# Patient Record
Sex: Female | Born: 1945 | Race: White | Hispanic: No | Marital: Married | Smoking: Never smoker
Health system: Southern US, Community
[De-identification: ages and names within clinical notes are randomized; demographics above are authoritative.]

## PROBLEM LIST (undated history)

## (undated) DIAGNOSIS — M1990 Unspecified osteoarthritis, unspecified site: Secondary | ICD-10-CM

## (undated) DIAGNOSIS — C801 Malignant (primary) neoplasm, unspecified: Secondary | ICD-10-CM

## (undated) DIAGNOSIS — Z8489 Family history of other specified conditions: Secondary | ICD-10-CM

## (undated) DIAGNOSIS — E039 Hypothyroidism, unspecified: Secondary | ICD-10-CM

## (undated) DIAGNOSIS — I639 Cerebral infarction, unspecified: Secondary | ICD-10-CM

## (undated) DIAGNOSIS — F419 Anxiety disorder, unspecified: Secondary | ICD-10-CM

## (undated) DIAGNOSIS — I10 Essential (primary) hypertension: Secondary | ICD-10-CM

## (undated) DIAGNOSIS — R0600 Dyspnea, unspecified: Secondary | ICD-10-CM

## (undated) DIAGNOSIS — I471 Supraventricular tachycardia, unspecified: Secondary | ICD-10-CM

## (undated) DIAGNOSIS — K219 Gastro-esophageal reflux disease without esophagitis: Secondary | ICD-10-CM

## (undated) DIAGNOSIS — E079 Disorder of thyroid, unspecified: Secondary | ICD-10-CM

## (undated) DIAGNOSIS — T8859XA Other complications of anesthesia, initial encounter: Secondary | ICD-10-CM

## (undated) DIAGNOSIS — I771 Stricture of artery: Secondary | ICD-10-CM

## (undated) DIAGNOSIS — T4145XA Adverse effect of unspecified anesthetic, initial encounter: Secondary | ICD-10-CM

## (undated) DIAGNOSIS — R519 Headache, unspecified: Secondary | ICD-10-CM

## (undated) HISTORY — PX: APPENDECTOMY: SHX54

## (undated) HISTORY — PX: OVARIAN CYST REMOVAL: SHX89

## (undated) HISTORY — PX: CARDIAC CATHETERIZATION: SHX172

## (undated) HISTORY — PX: ABDOMINAL HYSTERECTOMY: SHX81

## (undated) HISTORY — PX: COLONOSCOPY: SHX174

## (undated) HISTORY — PX: DILATION AND CURETTAGE OF UTERUS: SHX78

## (undated) HISTORY — PX: CARPAL TUNNEL RELEASE: SHX101

---

## 2008-11-08 ENCOUNTER — Emergency Department: Payer: Self-pay | Admitting: Emergency Medicine

## 2015-01-16 DIAGNOSIS — R42 Dizziness and giddiness: Secondary | ICD-10-CM | POA: Insufficient documentation

## 2015-01-16 DIAGNOSIS — I10 Essential (primary) hypertension: Secondary | ICD-10-CM

## 2017-10-12 ENCOUNTER — Encounter: Payer: Self-pay | Admitting: Emergency Medicine

## 2017-10-12 ENCOUNTER — Emergency Department
Admission: EM | Admit: 2017-10-12 | Discharge: 2017-10-12 | Disposition: A | Discharge status: Home / Self Care | Payer: Medicare Other | Attending: Emergency Medicine | Admitting: Emergency Medicine

## 2017-10-12 ENCOUNTER — Emergency Department: Payer: Medicare Other

## 2017-10-12 DIAGNOSIS — R200 Anesthesia of skin: Secondary | ICD-10-CM | POA: Insufficient documentation

## 2017-10-12 DIAGNOSIS — I10 Essential (primary) hypertension: Secondary | ICD-10-CM | POA: Diagnosis not present

## 2017-10-12 DIAGNOSIS — R42 Dizziness and giddiness: Secondary | ICD-10-CM | POA: Diagnosis present

## 2017-10-12 LAB — URINALYSIS, COMPLETE (UACMP) WITH MICROSCOPIC
Bacteria, UA: NONE SEEN
Bilirubin Urine: NEGATIVE
Glucose, UA: NEGATIVE mg/dL
Hgb urine dipstick: NEGATIVE
Ketones, ur: NEGATIVE mg/dL
Leukocytes, UA: NEGATIVE
Nitrite: NEGATIVE
Protein, ur: NEGATIVE mg/dL
Specific Gravity, Urine: 1.004 — ABNORMAL LOW (ref 1.005–1.030)
Squamous Epithelial / LPF: NONE SEEN
WBC, UA: NONE SEEN WBC/hpf (ref 0–5)
pH: 8 (ref 5.0–8.0)

## 2017-10-12 LAB — CBC
HCT: 40.6 % (ref 35.0–47.0)
Hemoglobin: 14 g/dL (ref 12.0–16.0)
MCH: 31.5 pg (ref 26.0–34.0)
MCHC: 34.5 g/dL (ref 32.0–36.0)
MCV: 91.4 fL (ref 80.0–100.0)
Platelets: 218 10*3/uL (ref 150–440)
RBC: 4.44 MIL/uL (ref 3.80–5.20)
RDW: 12.2 % (ref 11.5–14.5)
WBC: 4.5 10*3/uL (ref 3.6–11.0)

## 2017-10-12 LAB — COMPREHENSIVE METABOLIC PANEL
ALT: 21 U/L (ref 14–54)
AST: 24 U/L (ref 15–41)
Albumin: 4.2 g/dL (ref 3.5–5.0)
Alkaline Phosphatase: 76 U/L (ref 38–126)
Anion gap: 7 (ref 5–15)
BUN: 14 mg/dL (ref 6–20)
CO2: 28 mmol/L (ref 22–32)
Calcium: 9.5 mg/dL (ref 8.9–10.3)
Chloride: 102 mmol/L (ref 101–111)
Creatinine, Ser: 0.7 mg/dL (ref 0.44–1.00)
GFR calc Af Amer: >60 mL/min (ref >60)
GFR calc non Af Amer: >60 mL/min (ref >60)
Glucose, Bld: 96 mg/dL (ref 65–99)
Potassium: 4.1 mmol/L (ref 3.5–5.1)
Sodium: 137 mmol/L (ref 135–145)
Total Bilirubin: 0.7 mg/dL (ref 0.3–1.2)
Total Protein: 7.6 g/dL (ref 6.5–8.1)

## 2017-10-12 LAB — TROPONIN I: Troponin I: <0.03 ng/mL (ref <0.03)

## 2017-10-12 MED ORDER — MECLIZINE HCL 25 MG PO TABS: 25.0000 mg | ORAL_TABLET | Freq: Three times a day (TID) | ORAL | 0 refills | Status: DC | PRN

## 2017-10-12 MED ORDER — MECLIZINE HCL 25 MG PO TABS
25.0000 mg | ORAL_TABLET | Freq: Once | ORAL | Status: AC
  Administered 2017-10-12: 25 mg via ORAL
  Filled 2017-10-12: qty 1

## 2017-10-12 NOTE — ED Notes (Signed)
Patient presents to the ED with dizziness that she first noticed when she sat up in bed to take her morning medication.  Patient states, "I felt so dizzy so I decided not to get up but to lie back down, I thought I got up too fast.  Then later when I went to the bathroom I was still extremely dizzy, I had to hold on to furniture to get there."  Patient is in no obvious distress at this time.

## 2017-10-12 NOTE — ED Notes (Signed)
Nurse assisted pt to restroom. Pt tolerated well. Pt changed into gown and non-slip socks and jewelry removed for MRI.

## 2017-10-12 NOTE — ED Notes (Signed)
Pt returned from MRI °

## 2017-10-12 NOTE — ED Notes (Signed)
Pt denying any HA at this time.. Pt stating HA at times when she lays on her side. Pt in NAD at this time. Pt is recently returned from [place]

## 2017-10-12 NOTE — ED Provider Notes (Signed)
[facility] Emergency Department Provider Note ____________________________________________   I have reviewed the triage vital signs and the triage nursing note no  HISTORY  Chief Complaint Dizziness   Historian Patient and spouse  HPI Shannon [name] is a 71 y.o. female with a history of hypertension, takes propranolol and 325 aspirin, without history of stroke or TIA presents with an episode where she woke up this morning around 5 AM and felt pressure in her head and then tingling or "twitching" or numbness at her right cheek which came and went.  No arm or leg weakness.  No slurred speech.  No trouble finding her words.  Never had an episode like this before.  States that she went back to sleep and when she got up and was walking she felt like she was going to go to the left side.  She called her primary care doctor's office and was referred to come to the ED.  No recent illnesses or fevers.  No respiratory symptoms.  Denies headache, just stated it was a pressure or in expansion.  No neck pain or stiffness.   No past medical history on file.  There are no active problems to display for this patient.   No past surgical history on file.  Prior to Admission medications   Medication Sig Start Date End Date Taking? Authorizing Provider  meclizine (ANTIVERT) 25 MG tablet Take 1 tablet (25 mg total) by mouth 3 (three) times daily as needed for dizziness or nausea. 10/12/17   Lisa Roca, MD    Allergies  Allergen Reactions  . Sulfa Antibiotics Other (See Comments)    Tongue turned black    No family history on file.  Social History Social History  Substance Use Topics  . Smoking status: Never Smoker  . Smokeless tobacco: Not on file  . Alcohol use Not on file    Review of Systems  Constitutional: Negative for fever. Eyes: Negative for visual changes. ENT: Negative for sore throat. Cardiovascular: Negative for chest pain. Respiratory: Negative for  shortness of breath. Gastrointestinal: Negative for abdominal pain, vomiting and diarrhea. Genitourinary: Negative for dysuria. Musculoskeletal: Negative for back pain. Skin: Negative for rash. Neurological: Positive for dizziness and a feeling of off balance as per HPI.  ____________________________________________   PHYSICAL EXAM:  VITAL SIGNS: ED Triage Vitals  Enc Vitals Group     BP 10/12/17 0852 (!) 191/68     Pulse Rate 10/12/17 0852 67     Resp 10/12/17 0852 18     Temp 10/12/17 0852 97.6 F (36.4 C)     Temp Source 10/12/17 0852 Oral     SpO2 10/12/17 0852 100 %     Weight 10/12/17 0853 114 lb (51.7 kg)     Height 10/12/17 0853 5\' 5"  (1.651 m)     Head Circumference --      Peak Flow --      Pain Score --      Pain Loc --      Pain Edu? --      Excl. in [place]? --      Constitutional: Alert and oriented. Well appearing and in no distress.  Somewhat anxious. HEENT   Head: Normocephalic and atraumatic.      Eyes: Conjunctivae are normal. Pupils equal and round.       Ears:         Nose: No congestion/rhinnorhea.   Mouth/Throat: Mucous membranes are moist.   Neck: No stridor. Cardiovascular/Chest: Normal rate,  regular rhythm.  No murmurs, rubs, or gallops. Respiratory: Normal respiratory effort without tachypnea nor retractions. Breath sounds are clear and equal bilaterally. No wheezes/rales/rhonchi. Gastrointestinal: Soft. No distention, no guarding, no rebound. Nontender.    Genitourinary/rectal:Deferred Musculoskeletal: Nontender with normal range of motion in all extremities. No joint effusions.  No lower extremity tenderness.  No edema. Neurologic: No facial droop.  Cranial nerves II through X intact.  Normal speech and language. No gross or focal neurologic deficits are appreciated.  5 out of 5 strength in 4 extremity's.  Coordination finger-nose intact bilaterally. Skin:  Skin is warm, dry and intact. No rash noted. Psychiatric: Mood and affect are  normal. Speech and behavior are normal. Patient exhibits appropriate insight and judgment.   ____________________________________________  LABS (pertinent positives/negatives) I, Lisa Roca, MD the attending physician have reviewed the labs noted below.  Labs Reviewed  URINALYSIS, COMPLETE (UACMP) WITH MICROSCOPIC - Abnormal; Notable for the following:       Result Value   Color, Urine STRAW (*)    APPearance CLEAR (*)    Specific Gravity, Urine 1.004 (*)    All other components within normal limits  CBC  COMPREHENSIVE METABOLIC PANEL  TROPONIN I    ____________________________________________    EKG I, Lisa Roca, MD, the attending physician have personally viewed and interpreted all ECGs.  60 bpm normal sinus rhythm.  Narrow QRS.  Nonspecific ST and T wave ____________________________________________  RADIOLOGY All Xrays were viewed by me.  Imaging interpreted by Radiologist, and I, Lisa Roca, MD the attending physician have reviewed the radiologist interpretation noted below.  CT head without contrast:    CLINICAL DATA: Dizziness  EXAM: CT HEAD WITHOUT CONTRAST  TECHNIQUE: Contiguous axial images were obtained from the base of the skull through the vertex without intravenous contrast.  COMPARISON: None.  FINDINGS: Brain: No evidence of acute infarction, hemorrhage, hydrocephalus, extra-axial collection or mass lesion/mass effect.  Vascular: No hyperdense vessel or unexpected calcification.  Skull: Normal. Negative for fracture or focal lesion.  Sinuses/Orbits: No acute finding.  Other: None.  IMPRESSION: No acute abnormality noted.     MRI without contrast brain:  IMPRESSION: 1. No acute intracranial abnormality. 2. 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 __________________________________________  PROCEDURES  Procedure(s) performed: None  Critical Care performed:  None  ____________________________________________  No current facility-administered medications on file prior to encounter.    No current outpatient prescriptions on file prior to encounter.    ____________________________________________  ED COURSE / ASSESSMENT AND PLAN  Pertinent labs & imaging results that were available during my care of the patient were reviewed by me and considered in my medical decision making (see chart for details).   By patient description, uncertain etiology.  No ongoing headache or really description of head pain.  She initially called it dizziness, and does not really describe it spinning, but does not really described as lightheadedness either.  She felt like she was off balance when she walked, but neurologic exam seems intact now.  Patient herself states that she does not quite feel back to baseline in terms of feeling motion sickness or off balance.  History, exam, and evaluation, seems unlikely for infection, dehydration, and orthostatics are normal.  Consider possibility of TIA, patient states she does not feel back to normal so I did discuss with her obtaining MRI to rule out any underlying small early stroke.  I think the most likely cause is vertigo although in some ways  not classic either.  MRI is overall reassuring for no acute finding.  Patient okay for discharge home.  I have asked her to have close follow-up with her primary care doctor.  DIFFERENTIAL DIAGNOSIS: Including but not limited to infection, urinary tract infection, hyperglycemia, electrolyte disturbance, vertigo, stroke, vision changes, orthostatic hypotension, arrhythmia, dehydration, etc.  CONSULTATIONS:   None   Patient / Family / Caregiver informed of clinical course, medical decision-making process, and agree with plan.   I discussed return precautions, follow-up instructions, and discharge instructions with patient and/or family.  Discharge Instructions : You are  evaluated for dizziness, and although no certain cause was found, your exam and evaluation are overall reassuring in the emergency department today.  I think most likely her episode is related to vertigo and you are discharged with meclizine to help with symptoms.  It is possible this could be related to a mini stroke called TIA.  Please call your doctor's office to discuss any further close follow-up for other testing with regard to TIA.  Return to the emergency department immediately for any new or worsening condition, any one-sided weakness or numbness, slurred speech, trouble finding words, dizziness, passing out, seizure, chest pain, palpitations, confusion altered mental status, or any other symptoms concerning to you.  ___________________________________________   FINAL CLINICAL IMPRESSION(S) / ED DIAGNOSES   Final diagnoses:  Dizziness              Note: This dictation was prepared with Dragon dictation. Any transcriptional errors that result from this process are unintentional    Lisa Roca, MD 10/12/17 1427

## 2017-10-12 NOTE — ED Notes (Signed)
Pt transported by [place], [place] to MRI for scan.

## 2017-10-12 NOTE — ED Notes (Signed)
MRI tech speaking with pt

## 2017-10-12 NOTE — ED Notes (Signed)
Nurse it to check on pt. Pt in NAD at this time. Nurse explained that we were just waiting on Dr. Reita Cliche to view MRI results at this time. Nurse helped re-position pt

## 2017-10-12 NOTE — Discharge Instructions (Signed)
You are evaluated for dizziness, and although no certain cause was found, your exam and evaluation are overall reassuring in the emergency department today.  I think most likely her episode is related to vertigo and you are discharged with meclizine to help with symptoms.  It is possible this could be related to a mini stroke called TIA.  Please call your doctor's office to discuss any further close follow-up for other testing with regard to TIA.  Return to the emergency department immediately for any new or worsening condition, any one-sided weakness or numbness, slurred speech, trouble finding words, dizziness, passing out, seizure, chest pain, palpitations, confusion altered mental status, or any other symptoms concerning to you.

## 2017-10-12 NOTE — ED Triage Notes (Signed)
States woke at 530 this am and was dizzy. Denies chest pain or SOB. Cont dizzy and weak.

## 2017-10-17 ENCOUNTER — Other Ambulatory Visit: Payer: Self-pay | Admitting: Family Medicine

## 2017-10-17 DIAGNOSIS — R42 Dizziness and giddiness: Secondary | ICD-10-CM

## 2017-10-20 ENCOUNTER — Ambulatory Visit (HOSPITAL_BASED_OUTPATIENT_CLINIC_OR_DEPARTMENT_OTHER)
Admission: RE | Admit: 2017-10-20 | Discharge: 2017-10-20 | Disposition: A | Discharge status: Home / Self Care | Payer: Medicare Other | Source: Ambulatory Visit | Attending: Family Medicine | Admitting: Family Medicine

## 2017-10-20 ENCOUNTER — Ambulatory Visit
Admission: RE | Admit: 2017-10-20 | Discharge: 2017-10-20 | Disposition: A | Discharge status: Home / Self Care | Payer: Medicare Other | Source: Ambulatory Visit | Attending: Family Medicine | Admitting: Family Medicine

## 2017-10-20 DIAGNOSIS — I5030 Unspecified diastolic (congestive) heart failure: Secondary | ICD-10-CM | POA: Diagnosis not present

## 2017-10-20 DIAGNOSIS — I6522 Occlusion and stenosis of left carotid artery: Secondary | ICD-10-CM | POA: Insufficient documentation

## 2017-10-20 DIAGNOSIS — R42 Dizziness and giddiness: Secondary | ICD-10-CM | POA: Diagnosis present

## 2017-10-20 NOTE — Progress Notes (Signed)
*  PRELIMINARY RESULTS* Echocardiogram 2D Echocardiogram has been performed.  Sherrie Sport 10/20/2017, 11:58 AM

## 2018-11-12 DIAGNOSIS — I639 Cerebral infarction, unspecified: Secondary | ICD-10-CM

## 2018-11-12 HISTORY — DX: Cerebral infarction, unspecified: I63.9

## 2018-12-08 ENCOUNTER — Inpatient Hospital Stay: Payer: Medicare Other

## 2018-12-08 ENCOUNTER — Other Ambulatory Visit: Payer: Self-pay

## 2018-12-08 ENCOUNTER — Inpatient Hospital Stay
Admission: EM | Admit: 2018-12-08 | Discharge: 2018-12-09 | DRG: 064 | Disposition: A | Discharge status: Home Health | Payer: Medicare Other | Attending: Internal Medicine | Admitting: Internal Medicine

## 2018-12-08 ENCOUNTER — Encounter: Payer: Self-pay | Admitting: Emergency Medicine

## 2018-12-08 ENCOUNTER — Emergency Department: Payer: Medicare Other

## 2018-12-08 DIAGNOSIS — I639 Cerebral infarction, unspecified: Principal | ICD-10-CM | POA: Diagnosis present

## 2018-12-08 DIAGNOSIS — Z823 Family history of stroke: Secondary | ICD-10-CM | POA: Diagnosis not present

## 2018-12-08 DIAGNOSIS — G936 Cerebral edema: Secondary | ICD-10-CM | POA: Diagnosis present

## 2018-12-08 DIAGNOSIS — Z8673 Personal history of transient ischemic attack (TIA), and cerebral infarction without residual deficits: Secondary | ICD-10-CM | POA: Diagnosis not present

## 2018-12-08 DIAGNOSIS — Z7982 Long term (current) use of aspirin: Secondary | ICD-10-CM | POA: Diagnosis not present

## 2018-12-08 DIAGNOSIS — R29810 Facial weakness: Secondary | ICD-10-CM | POA: Diagnosis present

## 2018-12-08 DIAGNOSIS — Z90710 Acquired absence of both cervix and uterus: Secondary | ICD-10-CM | POA: Diagnosis not present

## 2018-12-08 DIAGNOSIS — I10 Essential (primary) hypertension: Secondary | ICD-10-CM | POA: Diagnosis present

## 2018-12-08 DIAGNOSIS — R29700 NIHSS score 0: Secondary | ICD-10-CM | POA: Diagnosis present

## 2018-12-08 DIAGNOSIS — Z79890 Hormone replacement therapy (postmenopausal): Secondary | ICD-10-CM | POA: Diagnosis not present

## 2018-12-08 DIAGNOSIS — E039 Hypothyroidism, unspecified: Secondary | ICD-10-CM | POA: Diagnosis present

## 2018-12-08 DIAGNOSIS — Z79899 Other long term (current) drug therapy: Secondary | ICD-10-CM

## 2018-12-08 HISTORY — DX: Disorder of thyroid, unspecified: E07.9

## 2018-12-08 HISTORY — DX: Essential (primary) hypertension: I10

## 2018-12-08 LAB — COMPREHENSIVE METABOLIC PANEL
ALT: 21 U/L (ref 0–44)
AST: 24 U/L (ref 15–41)
Albumin: 4.3 g/dL (ref 3.5–5.0)
Alkaline Phosphatase: 73 U/L (ref 38–126)
Anion gap: 7 (ref 5–15)
BUN: 15 mg/dL (ref 8–23)
CO2: 26 mmol/L (ref 22–32)
Calcium: 9.7 mg/dL (ref 8.9–10.3)
Chloride: 105 mmol/L (ref 98–111)
Creatinine, Ser: 0.61 mg/dL (ref 0.44–1.00)
GFR calc Af Amer: >60 mL/min (ref >60)
GFR calc non Af Amer: >60 mL/min (ref >60)
Glucose, Bld: 88 mg/dL (ref 70–99)
Potassium: 3.9 mmol/L (ref 3.5–5.1)
Sodium: 138 mmol/L (ref 135–145)
Total Bilirubin: 0.5 mg/dL (ref 0.3–1.2)
Total Protein: 7.1 g/dL (ref 6.5–8.1)

## 2018-12-08 LAB — URINALYSIS, COMPLETE (UACMP) WITH MICROSCOPIC
Bacteria, UA: NONE SEEN
Bilirubin Urine: NEGATIVE
Glucose, UA: NEGATIVE mg/dL
Hgb urine dipstick: NEGATIVE
Ketones, ur: NEGATIVE mg/dL
Leukocytes, UA: NEGATIVE
Nitrite: NEGATIVE
Protein, ur: NEGATIVE mg/dL
Specific Gravity, Urine: 1.004 — ABNORMAL LOW (ref 1.005–1.030)
WBC, UA: NONE SEEN WBC/hpf (ref 0–5)
pH: 8 (ref 5.0–8.0)

## 2018-12-08 LAB — TROPONIN I: Troponin I: <0.03 ng/mL (ref <0.03)

## 2018-12-08 LAB — CBC WITH DIFFERENTIAL/PLATELET
Abs Immature Granulocytes: 0.01 10*3/uL (ref 0.00–0.07)
Basophils Absolute: 0 10*3/uL (ref 0.0–0.1)
Basophils Relative: 1 %
Eosinophils Absolute: 0.2 10*3/uL (ref 0.0–0.5)
Eosinophils Relative: 3 %
HCT: 39 % (ref 36.0–46.0)
Hemoglobin: 13.4 g/dL (ref 12.0–15.0)
Immature Granulocytes: 0 %
Lymphocytes Relative: 32 %
Lymphs Abs: 1.5 10*3/uL (ref 0.7–4.0)
MCH: 31.2 pg (ref 26.0–34.0)
MCHC: 34.4 g/dL (ref 30.0–36.0)
MCV: 90.9 fL (ref 80.0–100.0)
Monocytes Absolute: 0.3 10*3/uL (ref 0.1–1.0)
Monocytes Relative: 8 %
Neutro Abs: 2.6 10*3/uL (ref 1.7–7.7)
Neutrophils Relative %: 56 %
Platelets: 208 10*3/uL (ref 150–400)
RBC: 4.29 MIL/uL (ref 3.87–5.11)
RDW: 11.7 % (ref 11.5–15.5)
WBC: 4.6 10*3/uL (ref 4.0–10.5)
nRBC: 0 % (ref 0.0–0.2)

## 2018-12-08 LAB — TSH: TSH: 1.901 u[IU]/mL (ref 0.350–4.500)

## 2018-12-08 LAB — T4, FREE: Free T4: 1.03 ng/dL (ref 0.82–1.77)

## 2018-12-08 MED ORDER — LORAZEPAM 2 MG/ML IJ SOLN
0.5000 mg | Freq: Once | INTRAMUSCULAR | Status: AC
  Administered 2018-12-08: 0.5 mg via INTRAVENOUS
  Filled 2018-12-08: qty 1

## 2018-12-08 MED ORDER — ASPIRIN 325 MG PO TABS: 325.0000 mg | ORAL_TABLET | Freq: Every day | ORAL | Status: DC

## 2018-12-08 MED ORDER — ACETAMINOPHEN 325 MG PO TABS: 650.0000 mg | ORAL_TABLET | ORAL | Status: DC | PRN

## 2018-12-08 MED ORDER — ATORVASTATIN CALCIUM 20 MG PO TABS
40.0000 mg | ORAL_TABLET | Freq: Every day | ORAL | Status: DC
  Administered 2018-12-08: 40 mg via ORAL
  Filled 2018-12-08: qty 2

## 2018-12-08 MED ORDER — CALCIUM 600 MG PO TABS: 1.0000 | ORAL_TABLET | Freq: Every day | ORAL | Status: DC

## 2018-12-08 MED ORDER — LEVOTHYROXINE SODIUM 50 MCG PO TABS: 50.0000 ug | ORAL_TABLET | ORAL | Status: DC

## 2018-12-08 MED ORDER — LEVOTHYROXINE SODIUM 75 MCG PO TABS
75.0000 ug | ORAL_TABLET | ORAL | Status: DC
  Administered 2018-12-09: 75 ug via ORAL
  Filled 2018-12-08: qty 3
  Filled 2018-12-08: qty 1

## 2018-12-08 MED ORDER — LEVOTHYROXINE SODIUM 50 MCG PO TABS: 75.0000 ug | ORAL_TABLET | Freq: Every day | ORAL | Status: DC

## 2018-12-08 MED ORDER — ACETAMINOPHEN 650 MG RE SUPP: 650.0000 mg | RECTAL | Status: DC | PRN

## 2018-12-08 MED ORDER — LEVOTHYROXINE SODIUM 50 MCG PO TABS: 50.0000 ug | ORAL_TABLET | Freq: Every day | ORAL | Status: DC

## 2018-12-08 MED ORDER — CLONAZEPAM 0.5 MG PO TABS: 0.5000 mg | ORAL_TABLET | Freq: Two times a day (BID) | ORAL | Status: DC | PRN

## 2018-12-08 MED ORDER — VITAMIN E 180 MG (400 UNIT) PO CAPS
400.0000 [IU] | ORAL_CAPSULE | Freq: Every day | ORAL | Status: DC
  Filled 2018-12-08 (×2): qty 1

## 2018-12-08 MED ORDER — HYDRALAZINE HCL 20 MG/ML IJ SOLN
10.0000 mg | INTRAMUSCULAR | Status: DC | PRN
  Administered 2018-12-08: 10 mg via INTRAVENOUS
  Filled 2018-12-08: qty 1

## 2018-12-08 MED ORDER — LISINOPRIL 10 MG PO TABS
10.0000 mg | ORAL_TABLET | Freq: Every day | ORAL | Status: DC
  Administered 2018-12-08 – 2018-12-09 (×2): 10 mg via ORAL
  Filled 2018-12-08 (×2): qty 1

## 2018-12-08 MED ORDER — STROKE: EARLY STAGES OF RECOVERY BOOK
Freq: Once | Status: AC
  Administered 2018-12-08: 19:00:00

## 2018-12-08 MED ORDER — CLOPIDOGREL BISULFATE 75 MG PO TABS
75.0000 mg | ORAL_TABLET | Freq: Every day | ORAL | Status: DC
  Administered 2018-12-09: 75 mg via ORAL
  Filled 2018-12-08: qty 1

## 2018-12-08 MED ORDER — ASPIRIN 325 MG PO TABS
325.0000 mg | ORAL_TABLET | Freq: Every day | ORAL | Status: DC
  Filled 2018-12-08: qty 1

## 2018-12-08 MED ORDER — CALCIUM CARBONATE ANTACID 500 MG PO CHEW
1.0000 | CHEWABLE_TABLET | Freq: Every day | ORAL | Status: DC
  Administered 2018-12-09: 200 mg via ORAL
  Filled 2018-12-08: qty 1

## 2018-12-08 MED ORDER — ALENDRONATE SODIUM 70 MG PO TABS: 70.0000 mg | ORAL_TABLET | ORAL | Status: DC

## 2018-12-08 MED ORDER — ADULT MULTIVITAMIN W/MINERALS CH
1.0000 | ORAL_TABLET | Freq: Every day | ORAL | Status: DC
  Administered 2018-12-09: 11:00:00 1 via ORAL
  Filled 2018-12-08 (×2): qty 1

## 2018-12-08 MED ORDER — ASPIRIN EC 81 MG PO TBEC
81.0000 mg | DELAYED_RELEASE_TABLET | Freq: Every day | ORAL | Status: DC
  Administered 2018-12-09: 81 mg via ORAL
  Filled 2018-12-08 (×2): qty 1

## 2018-12-08 MED ORDER — ACETAMINOPHEN 160 MG/5ML PO SOLN
650.0000 mg | ORAL | Status: DC | PRN
  Filled 2018-12-08: qty 20.3

## 2018-12-08 MED ORDER — ASPIRIN 300 MG RE SUPP: 300.0000 mg | Freq: Every day | RECTAL | Status: DC

## 2018-12-08 MED ORDER — MULTIVITAMINS PO CAPS: 1.0000 | ORAL_CAPSULE | Freq: Every day | ORAL | Status: DC

## 2018-12-08 NOTE — ED Notes (Signed)
Pt arrived to ED from home with c/c of headache beginning last Saturday. Pt states that at onset she felt the right side of her mouth "pull up" and she felt that she had to pry it open to speak to her husband who was with her at the time. Pt and husband agreed that the facial episode lasted approx 10-15 seconds. Headache has been intermittent since. Last onset was this AM. Pt denies pain at time of assessment. Pt describes family Hx of stroke in mother and maternal aunt.

## 2018-12-08 NOTE — ED Provider Notes (Signed)
[facility] Emergency Department Provider Note       Time seen: ----------------------------------------- 9:13 AM on 12/08/2018 -----------------------------------------   I have reviewed the triage vital signs and the nursing notes.  HISTORY   Chief Complaint Headache and Weakness    HPI Shannon [name] is a 72 y.o. female with a history of dizziness, vertigo and headaches who presents to the ED for right-sided headache that started last Saturday.  Patient states it originally started on the base of her skull neck and then moved to her neck and back.  She reports feeling fatigued.  No past medical history on file.  There are no active problems to display for this patient.  Allergies Sulfa antibiotics and Mandol [cefamandole]  Social History Social History   Tobacco Use  . Smoking status: Never Smoker  Substance Use Topics  . Alcohol use: Not on file  . Drug use: Not on file   Review of Systems Constitutional: Negative for fever. Cardiovascular: Negative for chest pain. Respiratory: Negative for shortness of breath. Gastrointestinal: Negative for abdominal pain, vomiting and diarrhea. Musculoskeletal: Negative for back pain. Skin: Negative for rash. Neurological: Positive for headache, fatigue  All systems negative/normal/unremarkable except as stated in the HPI  ____________________________________________   PHYSICAL EXAM:  VITAL SIGNS: ED Triage Vitals [12/08/18 0859]  Enc Vitals Group     BP (!) 166/89     Pulse Rate 80     Resp 16     Temp (!) 97.4 F (36.3 C)     Temp Source Oral     SpO2 100 %     Weight 116 lb (52.6 kg)     Height 5\' 5"  (1.651 m)     Head Circumference      Peak Flow      Pain Score 0     Pain Loc      Pain Edu?      Excl. in [place]?    Constitutional: Alert and oriented. Well appearing and in no distress. Eyes: Conjunctivae are normal. Normal extraocular movements. ENT   Head: Normocephalic and  atraumatic.   Nose: No congestion/rhinnorhea.   Mouth/Throat: Mucous membranes are moist.   Neck: No stridor. Cardiovascular: Normal rate, regular rhythm. No murmurs, rubs, or gallops. Respiratory: Normal respiratory effort without tachypnea nor retractions. Breath sounds are clear and equal bilaterally. No wheezes/rales/rhonchi. Gastrointestinal: Soft and nontender. Normal bowel sounds Musculoskeletal: Nontender with normal range of motion in extremities. No lower extremity tenderness nor edema. Neurologic:  Normal speech and language. No gross focal neurologic deficits are appreciated.  Strength, sensation, cranial nerves appear to be normal Skin:  Skin is warm, dry and intact. No rash noted. Psychiatric: Mood and affect are normal. Speech and behavior are normal.  ____________________________________________  EKG: Interpreted by me.  Sinus rhythm with a rate of 83 bpm, sinus arrhythmia, normal axis, normal QT  ____________________________________________  ED COURSE:  As part of my medical decision making, I reviewed the following data within the [employer] History obtained from family if available, nursing notes, old chart and ekg, as well as notes from prior ED visits. Patient presented for headache, we will assess with labs and imaging as indicated at this time.   Procedures ____________________________________________   LABS (pertinent positives/negatives)  Labs Reviewed  CBC WITH DIFFERENTIAL/PLATELET  COMPREHENSIVE METABOLIC PANEL  TROPONIN I  TSH  T4, FREE  URINALYSIS, COMPLETE (UACMP) WITH MICROSCOPIC    RADIOLOGY Images were viewed by me  IMPRESSION: Apparent acute  infarct involving much of the right basal ganglia. Localized cytotoxic edema in this area noted.  Prior infarct anterior limb left external capsule.  No mass or hemorrhage. There are foci of arterial vascular calcification. There is mild mucosal thickening in several  ethmoid air cells.  ____________________________________________  DIFFERENTIAL DIAGNOSIS   Migraine, tension headache, CVA, TIA, trigeminal neuralgia  FINAL ASSESSMENT AND PLAN  CVA   Plan: The patient had presented for recent headache. Patient's labs were unremarkable. Patient's imaging did surprisingly reveal acute infarct involving the right basal ganglia with some localized edema.  She currently takes a full aspirin dose daily.  She will need MRI and further stroke work-up.  I will discuss with the hospitalist for admission.   Laurence Aly, MD   Note: This note was generated in part or whole with voice recognition software. Voice recognition is usually quite accurate but there are transcription errors that can and very often do occur. I apologize for any typographical errors that were not detected and corrected.     Earleen Newport, MD 12/08/18 [phone]

## 2018-12-08 NOTE — ED Notes (Signed)
Patient transported to room 116. 

## 2018-12-08 NOTE — Progress Notes (Signed)
PT Cancellation Note  Patient Details Name: Shannon [name] MRN: [id number] DOB: August 22, 1946   Cancelled Treatment:    Reason Eval/Treat Not Completed: Other (comment).  Pt with new stroke and pending neuro consult and brain MRI.  Will wait to see pt for PT until neuro consult completed and POC established.    Collie Siad PT, DPT 12/08/2018, 12:40 PM

## 2018-12-08 NOTE — H&P (Addendum)
[facility] [place] NAME: Shannon [name]    MR#:  [id number]  DATE OF BIRTH:  February 11, 1946  DATE OF ADMISSION:  12/08/2018  PRIMARY CARE PHYSICIAN: Donalynn Furlong, MD   REQUESTING/REFERRING PHYSICIAN:   CHIEF COMPLAINT:   Chief Complaint  Patient presents with  . Headache  . Weakness    HISTORY OF PRESENT ILLNESS: Shannon [name]  is a 72 y.o. female with a known history of hypertension, hypothyroidism presented to the emergency room for generalized weakness for the last 1 week.  She also had some on and off headache for the last 1 week.  She noticed some slowness of speech but no facial droop.  No complaints of any tingling or numbness in any of the body she.  He was evaluated in the emergency room with CT head which showed infarct in the right basal ganglia.  Hospitalist service was consulted.  Neurology service has been notified.  PAST MEDICAL HISTORY:   Past Medical History:  Diagnosis Date  . Hypertension   . Thyroid disease     PAST SURGICAL HISTORY: Dilatation and curettage Ovarian cyst removal Hysterectomy  SOCIAL HISTORY:  Social History   Tobacco Use  . Smoking status: Never Smoker  . Smokeless tobacco: Never Used  Substance Use Topics  . Alcohol use: Never    Frequency: Never    FAMILY HISTORY:  Family History  Problem Relation Age of Onset  . CVA Mother   . Heart disease Mother   . Peripheral Artery Disease Father     DRUG ALLERGIES:  Allergies  Allergen Reactions  . Sulfa Antibiotics Other (See Comments)    Tongue turned black  . Mandol [Cefamandole] Rash    REVIEW OF SYSTEMS:   CONSTITUTIONAL: No fever, has fatigue and weakness.  EYES: No blurred or double vision.  EARS, NOSE, AND THROAT: No tinnitus or ear pain.  RESPIRATORY: No cough, shortness of breath, wheezing or hemoptysis.  CARDIOVASCULAR: No chest pain, orthopnea, edema.  GASTROINTESTINAL: No nausea, vomiting, diarrhea or abdominal  pain.  GENITOURINARY: No dysuria, hematuria.  ENDOCRINE: No polyuria, nocturia,  HEMATOLOGY: No anemia, easy bruising or bleeding SKIN: No rash or lesion. MUSCULOSKELETAL: No joint pain or arthritis.   NEUROLOGIC: No tingling, numbness, weakness.  Had headache PSYCHIATRY: No anxiety or depression.   MEDICATIONS AT HOME:  Prior to Admission medications   Medication Sig Start Date End Date Taking? Authorizing Provider  aspirin 325 MG tablet Take 1 tablet by mouth daily. 04/24/12  Yes [provider]  calcium carbonate (OS-CAL) 600 MG tablet Take 1 tablet by mouth daily.   Yes [provider]  calcium citrate-vitamin D (CITRACAL+D) 315-200 MG-UNIT tablet Take 1 tablet by mouth daily. 04/24/12  Yes [provider]  famotidine-calcium carbonate-magnesium hydroxide (PEPCID COMPLETE) 10-800-165 MG chewable tablet Chew 1 tablet by mouth daily as needed.   Yes [provider]  levothyroxine (SYNTHROID, LEVOTHROID) 50 MCG tablet Take 1 tablet by mouth daily. 04/24/12  Yes [provider]  levothyroxine (SYNTHROID, LEVOTHROID) 75 MCG tablet Take 1 tablet by mouth daily. 04/24/12  Yes [provider]  lisinopril (PRINIVIL,ZESTRIL) 10 MG tablet Take 1 tablet by mouth daily.   Yes [provider]  Multiple Vitamin (MULTIVITAMIN) capsule Take 1 capsule by mouth daily. 04/24/12  Yes [provider]  vitamin E 400 UNIT capsule Take 1 capsule by mouth daily.   Yes [provider]  alendronate (FOSAMAX) 10 MG tablet Take 1 tablet  by mouth daily.    [provider]  alendronate (FOSAMAX) 70 MG tablet Take 1 tablet by mouth once a week.    [provider]  Alum Hydroxide-Mag Carbonate (GAVISCON PO) Take 5 mLs by mouth daily as needed.    [provider]  clonazePAM (KLONOPIN) 0.5 MG tablet Take 1 tablet by mouth daily as needed.    [provider]  meclizine (ANTIVERT) 25 MG tablet Take 1 tablet (25  mg total) by mouth 3 (three) times daily as needed for dizziness or nausea. Patient not taking: Reported on 12/08/2018 10/12/17   Lisa Roca, MD  ranitidine (ZANTAC) 150 MG tablet Take 1 tablet by mouth daily.    [provider]      PHYSICAL EXAMINATION:   VITAL SIGNS: Blood pressure (!) 197/92, pulse 74, temperature (!) 97.4 F (36.3 C), temperature source Oral, resp. rate 20, height 5\' 5"  (1.651 m), weight 52.6 kg, SpO2 100 %.  GENERAL:  72 y.o.-year-old patient lying in the bed with no acute distress.  EYES: Pupils equal, round, reactive to light and accommodation. No scleral icterus. Extraocular muscles intact.  HEENT: Head atraumatic, normocephalic. Oropharynx and nasopharynx clear.  NECK:  Supple, no jugular venous distention. No thyroid enlargement, no tenderness.  LUNGS: Normal breath sounds bilaterally, no wheezing, rales,rhonchi or crepitation. No use of accessory muscles of respiration.  CARDIOVASCULAR: S1, S2 normal. No murmurs, rubs, or gallops.  ABDOMEN: Soft, nontender, nondistended. Bowel sounds present. No organomegaly or mass.  EXTREMITIES: No pedal edema, cyanosis, or clubbing.  NEUROLOGIC: Cranial nerves II through XII are intact. Muscle strength 5/5 in all extremities. Sensation intact. Gait not checked.  PSYCHIATRIC: The patient is alert and oriented x 3.  SKIN: No obvious rash, lesion, or ulcer.   LABORATORY PANEL:   CBC Recent Labs  Lab 12/08/18 0929  WBC 4.6  HGB 13.4  HCT 39.0  PLT 208  MCV 90.9  MCH 31.2  MCHC 34.4  RDW 11.7  LYMPHSABS 1.5  MONOABS 0.3  EOSABS 0.2  BASOSABS 0.0   ------------------------------------------------------------------------------------------------------------------  Chemistries  Recent Labs  Lab 12/08/18 0929  NA 138  K 3.9  CL 105  CO2 26  GLUCOSE 88  BUN 15  CREATININE 0.61  CALCIUM 9.7  AST 24  ALT 21  ALKPHOS 73  BILITOT 0.5    ------------------------------------------------------------------------------------------------------------------ estimated creatinine clearance is 52.8 mL/min (by C-G formula based on SCr of 0.61 mg/dL). ------------------------------------------------------------------------------------------------------------------ Recent Labs    12/08/18 0929  TSH 1.901     Coagulation profile No results for input(s): INR, PROTIME in the last 168 hours. ------------------------------------------------------------------------------------------------------------------- No results for input(s): DDIMER in the last 72 hours. -------------------------------------------------------------------------------------------------------------------  Cardiac Enzymes Recent Labs  Lab 12/08/18 0929  TROPONINI <0.03   ------------------------------------------------------------------------------------------------------------------ Invalid input(s): POCBNP  ---------------------------------------------------------------------------------------------------------------  Urinalysis    Component Value Date/Time   COLORURINE YELLOW (A) 12/08/2018 0929   APPEARANCEUR CLEAR (A) 12/08/2018 0929   LABSPEC 1.004 (L) 12/08/2018 0929   PHURINE 8.0 12/08/2018 0929   GLUCOSEU NEGATIVE 12/08/2018 0929   HGBUR NEGATIVE 12/08/2018 0929   BILIRUBINUR NEGATIVE 12/08/2018 0929   KETONESUR NEGATIVE 12/08/2018 0929   PROTEINUR NEGATIVE 12/08/2018 0929   NITRITE NEGATIVE 12/08/2018 0929   LEUKOCYTESUR NEGATIVE 12/08/2018 0929     RADIOLOGY: Ct Head Wo Contrast  Result Date: 12/08/2018 CLINICAL DATA:  Right-sided headache for 6 days. Fatigue. Right-sided mouth weakness EXAM: CT HEAD WITHOUT CONTRAST TECHNIQUE: Contiguous axial images were obtained from the base of the skull through the vertex  without intravenous contrast. COMPARISON:  Head CT and brain MRI October 12, 2017 FINDINGS: Brain: The ventricles are normal in  size and configuration. There is no intracranial mass, hemorrhage, extra-axial fluid collection, or midline shift. There is decreased attenuation involving much of the right lentiform nucleus as well as involving portions of the right internal and external capsule, concerning for acute right basal ganglia infarct. There is evidence of a prior infarct involving the anterior limb of the left external capsule, stable. Brain parenchyma otherwise appears unremarkable. Vascular: No hyperdense vessel. There is calcification in each carotid siphon region. Skull: The bony calvarium appears intact. Sinuses/Orbits: There is mild mucosal thickening in several ethmoid air cells. Other visualized paranasal sinuses are clear. Visualized orbits appear symmetric bilaterally. Other: Mastoid air cells are clear. IMPRESSION: Apparent acute infarct involving much of the right basal ganglia. Localized cytotoxic edema in this area noted. Prior infarct anterior limb left external capsule. No mass or hemorrhage. There are foci of arterial vascular calcification. There is mild mucosal thickening in several ethmoid air cells. Electronically Signed   By: Lowella Grip III M.D.   On: 12/08/2018 09:42    EKG: Orders placed or performed during the hospital encounter of 12/08/18  . EKG 12-Lead  . EKG 12-Lead  . ED EKG  . ED EKG    IMPRESSION AND PLAN:  72 year old female patient with history of hypertension, hypothyroidism presented to the emergency room with generalized weakness and headache  -Acute ischemic CVA Onset of duration more than 1 week Continue aspirin Neurology consult Check MRI and MRA brain Carotid ultrasound and echocardiogram Cardiac monitoring Physical therapy and Occupational Therapy and speech therapy consults  -Hypertension uncontrolled Continue oral lisinopril for control of blood pressure PRN IV hydralazine as needed  -Hypothyroidism Resume Synthroid  -DVT prophylaxis Sequential  compression device to lower extremities  All the records are reviewed and case discussed with ED provider. Management plans discussed with the patient, family and they are in agreement.  CODE STATUS:Full code    TOTAL TIME TAKING CARE OF THIS PATIENT: 54 minutes.    Saundra Shelling M.D on 12/08/2018 at 11:23 AM  Between 7am to 6pm - Pager - [phone]  After 6pm go to [email or web address] - password [email or web address] [facility]  [facility]  Office  [phone]  CC: Primary care physician; Donalynn Furlong, MD

## 2018-12-08 NOTE — Progress Notes (Signed)
SLP Cancellation Note  Patient Details Name: Shannon [name] MRN: [id number] DOB: May 01, 1946   Cancelled treatment:       Reason Eval/Treat Not Completed: Patient at procedure or test/unavailable: Upon entry to room, family member present and stated that patient was taken for an MRI at time of visit. Eval not completed at this time. Skilled ST will f/u with patient for eval as indicated.   Loni Beckwith, M.S. CCC-SLP Speech-Language Pathologist  Loni Beckwith 12/08/2018, 1:31 PM

## 2018-12-08 NOTE — Progress Notes (Signed)
Advanced care plan.  Purpose of the Encounter: CODE STATUS  Parties in Attendance: Patient and family  Patient's Decision Capacity: Good  Subjective/Patient's story: Patient presented to the emergency room for generalized weakness and headache   Objective/Medical story She was evaluated for stroke CT head was positive for ischemic CVA Needs neurology consultation, carotid ultrasound MRI brain and work-up   Goals of care determination:  Advance care directives goals of care and treatment plan discussed Patient wants everything done which includes CPR, intubation ventilator if the need arises   CODE STATUS: Full code   Time spent discussing advanced care planning: 16 minutes

## 2018-12-08 NOTE — ED Notes (Addendum)
First Nurse Note: Patient states she "might be having a stroke", states she had right sided weakness of face onset on Saturday.  Alert and oriented X 4, speech clear.  No obvious weakness of extremities noted at this time.

## 2018-12-08 NOTE — ED Notes (Signed)
ED TO INPATIENT HANDOFF REPORT  Name/Age/Gender Shannon [name] 72 y.o. female  Code Status   Home/SNF/Other Home  Chief Complaint Right Sided Weakness  Level of Care/Admitting Diagnosis ED Disposition    ED Disposition Condition [facility]: [place] [[id number]]  Level of Care: Med-Surg [16]  Diagnosis: CVA (cerebral vascular accident) [facility]) [[id number]]  Admitting Physician: Saundra Shelling [[id number]]  Attending Physician: Saundra Shelling [[id number]]  Estimated length of stay: past midnight tomorrow  Certification:: I certify this patient will need inpatient services for at least 2 midnights  PT Class (Do Not Modify): Inpatient [101]  PT Acc Code (Do Not Modify): Private [1]       Medical History Past Medical History:  Diagnosis Date  . Hypertension   . Thyroid disease     Allergies Allergies  Allergen Reactions  . Sulfa Antibiotics Other (See Comments)    Tongue turned black  . Mandol [Cefamandole] Rash    IV Location/Drains/Wounds Patient Lines/Drains/Airways Status   Active Line/Drains/Airways    Name:   Placement date:   Placement time:   Site:   Days:   Peripheral IV 12/08/18 Right Antecubital   12/08/18    1027    Antecubital   less than 1          Labs/Imaging Results for orders placed or performed during the hospital encounter of 12/08/18 (from the past 48 hour(s))  CBC with Differential/Platelet     Status: None   Collection Time: 12/08/18  9:29 AM  Result Value Ref Range   WBC 4.6 4.0 - 10.5 K/uL   RBC 4.29 3.87 - 5.11 MIL/uL   Hemoglobin 13.4 12.0 - 15.0 g/dL   HCT 39.0 36.0 - 46.0 %   MCV 90.9 80.0 - 100.0 fL   MCH 31.2 26.0 - 34.0 pg   MCHC 34.4 30.0 - 36.0 g/dL   RDW 11.7 11.5 - 15.5 %   Platelets 208 150 - 400 K/uL   nRBC 0.0 0.0 - 0.2 %   Neutrophils Relative % 56 %   Neutro Abs 2.6 1.7 - 7.7 K/uL   Lymphocytes Relative 32 %   Lymphs Abs 1.5 0.7 - 4.0 K/uL   Monocytes Relative 8 %   Monocytes  Absolute 0.3 0.1 - 1.0 K/uL   Eosinophils Relative 3 %   Eosinophils Absolute 0.2 0.0 - 0.5 K/uL   Basophils Relative 1 %   Basophils Absolute 0.0 0.0 - 0.1 K/uL   Immature Granulocytes 0 %   Abs Immature Granulocytes 0.01 0.00 - 0.07 K/uL    Comment: Performed at [facility], [address] [place], [place] [address]  Comprehensive metabolic panel     Status: None   Collection Time: 12/08/18  9:29 AM  Result Value Ref Range   Sodium 138 135 - 145 mmol/L   Potassium 3.9 3.5 - 5.1 mmol/L   Chloride 105 98 - 111 mmol/L   CO2 26 22 - 32 mmol/L   Glucose, Bld 88 70 - 99 mg/dL   BUN 15 8 - 23 mg/dL   Creatinine, Ser 0.61 0.44 - 1.00 mg/dL   Calcium 9.7 8.9 - 10.3 mg/dL   Total Protein 7.1 6.5 - 8.1 g/dL   Albumin 4.3 3.5 - 5.0 g/dL   AST 24 15 - 41 U/L   ALT 21 0 - 44 U/L   Alkaline Phosphatase 73 38 - 126 U/L   Total Bilirubin 0.5 0.3 - 1.2 mg/dL   GFR  calc non Af Amer >60 >60 mL/min   GFR calc Af Amer >60 >60 mL/min   Anion gap 7 5 - 15    Comment: Performed at [facility], [address] [place], [place] [address]  Troponin I - ONCE - STAT     Status: None   Collection Time: 12/08/18  9:29 AM  Result Value Ref Range   Troponin I <0.03 <0.03 ng/mL    Comment: Performed at [facility], [address] [place], [place] [address]  TSH     Status: None   Collection Time: 12/08/18  9:29 AM  Result Value Ref Range   TSH 1.901 0.350 - 4.500 uIU/mL    Comment: Performed by a 3rd Generation assay with a functional sensitivity of <=0.01 uIU/mL. Performed at [facility], [address] [place], [place] [address]   T4, free     Status: None   Collection Time: 12/08/18  9:29 AM  Result Value Ref Range   Free T4 1.03 0.82 - 1.77 ng/dL    Comment: (NOTE) Biotin ingestion may interfere with free T4 tests. If the results are inconsistent with the TSH level, previous test results, or the clinical presentation, then consider biotin  interference. If needed, order repeat testing after stopping biotin. Performed at [facility], [address] [place], [place] [address]   Urinalysis, Complete w Microscopic     Status: Abnormal   Collection Time: 12/08/18  9:29 AM  Result Value Ref Range   Color, Urine YELLOW (A) YELLOW   APPearance CLEAR (A) CLEAR   Specific Gravity, Urine 1.004 (L) 1.005 - 1.030   pH 8.0 5.0 - 8.0   Glucose, UA NEGATIVE NEGATIVE mg/dL   Hgb urine dipstick NEGATIVE NEGATIVE   Bilirubin Urine NEGATIVE NEGATIVE   Ketones, ur NEGATIVE NEGATIVE mg/dL   Protein, ur NEGATIVE NEGATIVE mg/dL   Nitrite NEGATIVE NEGATIVE   Leukocytes, UA NEGATIVE NEGATIVE   WBC, UA NONE SEEN 0 - 5 WBC/hpf   Bacteria, UA NONE SEEN NONE SEEN   Squamous Epithelial / LPF 0-5 0 - 5    Comment: Performed at [facility], [address], [place], [place] [address]   Ct Head Wo Contrast  Result Date: 12/08/2018 CLINICAL DATA:  Right-sided headache for 6 days. Fatigue. Right-sided mouth weakness EXAM: CT HEAD WITHOUT CONTRAST TECHNIQUE: Contiguous axial images were obtained from the base of the skull through the vertex without intravenous contrast. COMPARISON:  Head CT and brain MRI October 12, 2017 FINDINGS: Brain: The ventricles are normal in size and configuration. There is no intracranial mass, hemorrhage, extra-axial fluid collection, or midline shift. There is decreased attenuation involving much of the right lentiform nucleus as well as involving portions of the right internal and external capsule, concerning for acute right basal ganglia infarct. There is evidence of a prior infarct involving the anterior limb of the left external capsule, stable. Brain parenchyma otherwise appears unremarkable. Vascular: No hyperdense vessel. There is calcification in each carotid siphon region. Skull: The bony calvarium appears intact. Sinuses/Orbits: There is mild mucosal thickening in several ethmoid air cells. Other  visualized paranasal sinuses are clear. Visualized orbits appear symmetric bilaterally. Other: Mastoid air cells are clear. IMPRESSION: Apparent acute infarct involving much of the right basal ganglia. Localized cytotoxic edema in this area noted. Prior infarct anterior limb left external capsule. No mass or hemorrhage. There are foci of arterial vascular calcification. There is mild mucosal thickening in several ethmoid air cells. Electronically  Signed   By: Lowella Grip III M.D.   On: 12/08/2018 09:42    Pending Labs Unresulted Labs (From admission, onward)    Start     Ordered   Signed and Held  Lipid panel  Tomorrow morning,   R    Comments:  Fasting    Signed and Held          Vitals/Pain Today's Vitals   12/08/18 0859 12/08/18 0923 12/08/18 1100 12/08/18 1115  BP: (!) 166/89 (!) 199/82 (!) 202/82 (!) 197/92  Pulse: 80 72 74   Resp: 16 18 (!) 21 20  Temp: (!) 97.4 F (36.3 C)     TempSrc: Oral     SpO2: 100% 98% 100%   Weight: 52.6 kg     Height: 5\' 5"  (1.651 m)     PainSc: 0-No pain 0-No pain      Isolation Precautions No active isolations  Medications Medications  LORazepam (ATIVAN) injection 0.5 mg (0.5 mg Intravenous Given 12/08/18 1042)    Mobility walks

## 2018-12-08 NOTE — Progress Notes (Signed)
PHARMACIST - PHYSICIAN COMMUNICATION  CONCERNING: P&T Medication Policy Regarding Oral Bisphosphonates  RECOMMENDATION: Your order for alendronate (Fosamax), ibandronate (Boniva), or risedronate (Actonel) has been discontinued at this time.  If the patient's post-hospital medical condition warrants safe use of this class of drugs, please resume the pre-hospital regimen upon discharge.  DESCRIPTION:  Alendronate (Fosamax), ibandronate (Boniva), and risedronate (Actonel) can cause severe esophageal erosions in patients who are unable to remain upright at least 30 minutes after taking this medication.   Since brief interruptions in therapy are thought to have minimal impact on bone mineral density, the [place] [facility] has established that bisphosphonate orders should be routinely discontinued during hospitalization.   To override this safety policy and permit administration of Boniva, Fosamax, or Actonel in the hospital, prescribers must write "DO NOT HOLD" in the comments section when placing the order for this class of medications.  Chinita Greenland PharmD Clinical Pharmacist 12/08/2018

## 2018-12-08 NOTE — ED Notes (Signed)
Pt assisted to bathroom

## 2018-12-08 NOTE — Consult Note (Signed)
Referring Physician: Pyreddy    Chief Complaint: Weakness and right facial droop  HPI: Shannon [name] is an 72 y.o. female with a history of stroke in the past who reports that on Saturday she noted that her face was pulling to the right and her speech was slurred.  On Sunday and Monday there was improvement in the facial droop but she was weak all over.  With no improvement the patient presented for evaluation.  Initial NIHSS of 0.    Date last known well: Date: 12/02/2018 Time last known well: Unable to determine tPA Given: No: Outside time window  Past Medical History:  Diagnosis Date  . Hypertension   . Thyroid disease     Past Surgical History:  Procedure Laterality Date  . ABDOMINAL HYSTERECTOMY    . APPENDECTOMY    . CARPAL TUNNEL RELEASE Bilateral   . DILATION AND CURETTAGE OF UTERUS    . OVARIAN CYST REMOVAL      Family History  Problem Relation Age of Onset  . CVA Mother   . Heart disease Mother   . Peripheral Artery Disease Father    Social History:  reports that she has never smoked. She has never used smokeless tobacco. She reports that she does not drink alcohol or use drugs.  Allergies:  Allergies  Allergen Reactions  . Other Palpitations    SINUTAB  . Sulfa Antibiotics Other (See Comments)    Tongue turned black  . Mandol [Cefamandole] Rash    Medications:  I have reviewed the patient's current medications. Prior to Admission medications   Medication Sig Start Date End Date Taking? Authorizing Provider  aspirin 325 MG tablet Take 1 tablet by mouth daily. 04/24/12  Yes [provider]  calcium carbonate (OS-CAL) 600 MG tablet Take 1 tablet by mouth daily.   Yes [provider]  calcium citrate-vitamin D (CITRACAL+D) 315-200 MG-UNIT tablet Take 1 tablet by mouth daily. 04/24/12  Yes [provider]  famotidine-calcium carbonate-magnesium hydroxide (PEPCID COMPLETE) 10-800-165 MG chewable tablet Chew 1 tablet by mouth daily as  needed.   Yes [provider]  levothyroxine (SYNTHROID, LEVOTHROID) 50 MCG tablet Take 1 tablet by mouth daily. 04/24/12  Yes [provider]  levothyroxine (SYNTHROID, LEVOTHROID) 75 MCG tablet Take 1 tablet by mouth daily. 04/24/12  Yes [provider]  lisinopril (PRINIVIL,ZESTRIL) 10 MG tablet Take 1 tablet by mouth daily.   Yes [provider]  Multiple Vitamin (MULTIVITAMIN) capsule Take 1 capsule by mouth daily. 04/24/12  Yes [provider]  vitamin E 400 UNIT capsule Take 1 capsule by mouth daily.   Yes [provider]  alendronate (FOSAMAX) 10 MG tablet Take 1 tablet by mouth daily.    [provider]  alendronate (FOSAMAX) 70 MG tablet Take 1 tablet by mouth once a week.    [provider]  Alum Hydroxide-Mag Carbonate (GAVISCON PO) Take 5 mLs by mouth daily as needed.    [provider]  clonazePAM (KLONOPIN) 0.5 MG tablet Take 1 tablet by mouth daily as needed.    [provider]  meclizine (ANTIVERT) 25 MG tablet Take 1 tablet (25 mg total) by mouth 3 (three) times daily as needed for dizziness or nausea. Patient not taking: Reported on 12/08/2018 10/12/17   Lisa Roca, MD  ranitidine (ZANTAC) 150 MG tablet Take 1 tablet by mouth daily.    [provider]    Scheduled: .  stroke: mapping our early stages of recovery book  Does not apply Once  . aspirin  300 mg Rectal Daily   Or  . aspirin  325 mg Oral Daily  . atorvastatin  40 mg Oral q1800  . calcium carbonate  1 tablet Oral Daily  . [START ON 12/11/2018] levothyroxine  50 mcg Oral Q M,W,F  . [START ON 12/09/2018] levothyroxine  75 mcg Oral Q T,Th,S,Su  . lisinopril  10 mg Oral Daily  . multivitamin with minerals  1 tablet Oral Daily  . vitamin E  400 Units Oral Daily    ROS: History obtained from the patient  General ROS: fatigue Psychological ROS: negative for - behavioral disorder, hallucinations, memory  difficulties, mood swings or suicidal ideation Ophthalmic ROS: negative for - blurry vision, double vision, eye pain or loss of vision ENT ROS: negative for - epistaxis, nasal discharge, oral lesions, sore throat, tinnitus or vertigo Allergy and Immunology ROS: negative for - hives or itchy/watery eyes Hematological and Lymphatic ROS: negative for - bleeding problems, bruising or swollen lymph nodes Endocrine ROS: negative for - galactorrhea, hair pattern changes, polydipsia/polyuria or temperature intolerance Respiratory ROS: negative for - cough, hemoptysis, shortness of breath or wheezing Cardiovascular ROS: negative for - chest pain, dyspnea on exertion, edema or irregular heartbeat Gastrointestinal ROS: negative for - abdominal pain, diarrhea, hematemesis, nausea/vomiting or stool incontinence Genito-Urinary ROS: negative for - dysuria, hematuria, incontinence or urinary frequency/urgency Musculoskeletal ROS: negative for - joint swelling or muscular weakness Neurological ROS: as noted in HPI Dermatological ROS: negative for rash and skin lesion changes  Physical Examination: Blood pressure (!) 182/80, pulse 72, temperature 97.7 F (36.5 C), temperature source Oral, resp. rate 18, height 5\' 5"  (1.651 m), weight 51.7 kg, SpO2 100 %.  HEENT-  Normocephalic, no lesions, without obvious abnormality.  Normal external eye and conjunctiva.  Normal TM's bilaterally.  Normal auditory canals and external ears. Normal external nose, mucus membranes and septum.  Normal pharynx. Cardiovascular- S1, S2 normal, pulses palpable throughout   Lungs- chest clear, no wheezing, rales, normal symmetric air entry Abdomen- soft, non-tender; bowel sounds normal; no masses,  no organomegaly Extremities- no edema Lymph-no adenopathy palpable Musculoskeletal-no joint tenderness, deformity or swelling Skin-warm and dry, no hyperpigmentation, vitiligo, or suspicious lesions  Neurological Examination   Mental  Status: Alert, oriented, thought content appropriate.  Speech fluent without evidence of aphasia.  Able to follow 3 step commands without difficulty. Cranial Nerves: II: Discs flat bilaterally; Visual fields grossly normal, pupils equal, round, reactive to light and accommodation III,IV, VI: ptosis not present, extra-ocular motions intact bilaterally V,VII: decrease in right NLF, facial light touch sensation normal bilaterally VIII: hearing normal bilaterally IX,X: gag reflex present XI: bilateral shoulder shrug XII: right tongue deviation Motor: Right : Upper extremity   5/5    Left:     Upper extremity   5/5  Lower extremity   5/5     Lower extremity   5/5 Tone and bulk:normal tone throughout; no atrophy noted Sensory: Pinprick and light touch intact throughout, bilaterally Deep Tendon Reflexes: 2+ and symmetric with absent AJ's bilaterally Plantars: Right: downgoing   Left: downgoing Cerebellar: Normal finger-to-nose, normal rapid alternating movements and normal heel-to-shin testing bilaterally Gait: not tested due to safety concerns    Laboratory Studies:  Basic Metabolic Panel: Recent Labs  Lab 12/08/18 0929  NA 138  K 3.9  CL 105  CO2 26  GLUCOSE 88  BUN 15  CREATININE 0.61  CALCIUM 9.7    Liver Function Tests: Recent Labs  Lab 12/08/18 0929  AST 24  ALT 21  ALKPHOS 73  BILITOT 0.5  PROT 7.1  ALBUMIN 4.3   No results for input(s): LIPASE, AMYLASE in the last 168 hours. No results for input(s): AMMONIA in the last 168 hours.  CBC: Recent Labs  Lab 12/08/18 0929  WBC 4.6  NEUTROABS 2.6  HGB 13.4  HCT 39.0  MCV 90.9  PLT 208    Cardiac Enzymes: Recent Labs  Lab 12/08/18 0929  TROPONINI <0.03    BNP: Invalid input(s): POCBNP  CBG: No results for input(s): GLUCAP in the last 168 hours.  Microbiology: No results found for this or any previous visit.  Coagulation Studies: No results for input(s): LABPROT, INR in the last 72  hours.  Urinalysis:  Recent Labs  Lab 12/08/18 0929  COLORURINE YELLOW*  LABSPEC 1.004*  PHURINE 8.0  GLUCOSEU NEGATIVE  HGBUR NEGATIVE  BILIRUBINUR NEGATIVE  KETONESUR NEGATIVE  PROTEINUR NEGATIVE  NITRITE NEGATIVE  LEUKOCYTESUR NEGATIVE    Lipid Panel: No results found for: CHOL, TRIG, HDL, CHOLHDL, VLDL, LDLCALC  HgbA1C: No results found for: HGBA1C  Urine Drug Screen:  No results found for: LABOPIA, COCAINSCRNUR, LABBENZ, AMPHETMU, THCU, LABBARB  Alcohol Level: No results for input(s): ETH in the last 168 hours.  Other results: EKG: sinus rhythm with marked sinus arhythmia at 83 bpm.  Imaging: Ct Head Wo Contrast  Result Date: 12/08/2018 CLINICAL DATA:  Right-sided headache for 6 days. Fatigue. Right-sided mouth weakness EXAM: CT HEAD WITHOUT CONTRAST TECHNIQUE: Contiguous axial images were obtained from the base of the skull through the vertex without intravenous contrast. COMPARISON:  Head CT and brain MRI October 12, 2017 FINDINGS: Brain: The ventricles are normal in size and configuration. There is no intracranial mass, hemorrhage, extra-axial fluid collection, or midline shift. There is decreased attenuation involving much of the right lentiform nucleus as well as involving portions of the right internal and external capsule, concerning for acute right basal ganglia infarct. There is evidence of a prior infarct involving the anterior limb of the left external capsule, stable. Brain parenchyma otherwise appears unremarkable. Vascular: No hyperdense vessel. There is calcification in each carotid siphon region. Skull: The bony calvarium appears intact. Sinuses/Orbits: There is mild mucosal thickening in several ethmoid air cells. Other visualized paranasal sinuses are clear. Visualized orbits appear symmetric bilaterally. Other: Mastoid air cells are clear. IMPRESSION: Apparent acute infarct involving much of the right basal ganglia. Localized cytotoxic edema in this area  noted. Prior infarct anterior limb left external capsule. No mass or hemorrhage. There are foci of arterial vascular calcification. There is mild mucosal thickening in several ethmoid air cells. Electronically Signed   By: Lowella Grip III M.D.   On: 12/08/2018 09:42    Assessment: 72 y.o. female with history of HTN and left BG infarct who presents with facial droop and fatigue.  Head CT reviewed and shows evidence of chronic left BG infarct but also shows area of hypodensity in the right BG.  Concern is for recurrent infarct.  Patient on ASA prior to admission.  Further work up recommended.    Stroke Risk Factors - hypertension  Plan: 1. HgbA1c.  Fasting lipid panel pending 2. MRI, MRA  of the brain without contrast 3. PT consult, OT consult, Speech consult 4. Echocardiogram 5. Carotid dopplers 6. Prophylactic therapy-ASA 81mg  and Plavix 75mg  daily 7. NPO until RN stroke swallow screen 8. Telemetry monitoring 9. Frequent neuro checks  Alexis Goodell, MD Neurology [phone] 12/08/2018, 1:51 PM

## 2018-12-08 NOTE — ED Notes (Signed)
Admitting MD at bedside.

## 2018-12-08 NOTE — Progress Notes (Signed)
OT Cancellation Note  Patient Details Name: Shannon [name] MRN: [id number] DOB: [birth year]   Cancelled Treatment:    Reason Eval/Treat Not Completed: Patient at procedure or test/ unavailable Pt being transported to MRI when OT presents for Eval. Will f/u at a later time as appropriate/available.   Gerrianne Scale, MS, OTR/L ascom [phone] or [phone] 12/08/18, 1:20 PM

## 2018-12-08 NOTE — ED Triage Notes (Signed)
Pt states that she has been having a rt sided headache since Saturday states that it originally started in the bas of her skull and neck, hurt to move her neck back. Pt states that she has wanted to sleep a lot and this am just feels weak all over. Some mild weakness noted to the rt side of mouth with smiling, no difference noted with eyebrows raised, pt does have a small amount of deviation of the tongue to the right with protrusion. No slurred speech noted. No weakness noted to bilat hand grips, no drift noted in arms bilat with extension

## 2018-12-08 NOTE — Progress Notes (Signed)
PT Cancellation Note  Patient Details Name: Shannon [name] MRN: [id number] DOB: 1946/10/23   Cancelled Treatment:    Reason Eval/Treat Not Completed: Patient at procedure or test/unavailable.  Pt currently off floor for brain MRI.  Will continue to follow acutely.   Collie Siad PT, DPT 12/08/2018, 2:20 PM

## 2018-12-08 NOTE — Evaluation (Signed)
Physical Therapy Evaluation Patient Details Name: Shannon [name] MRN: [id number] DOB: 09/08/1946 Today's Date: 12/08/2018   History of Present Illness  Pt is a 72 y/o F who presented with 1 wk h/o generalized weakness, headache, slowness of speech.  CT head showed infarct in R basal ganglia.  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     Clinical Impression  Pt admitted with above diagnosis. Pt currently with functional limitations due to the deficits listed below (see PT Problem List). Pt's strength, coordination, and sensation appear to be WNL BUE and BLE.  Only deficit noted was impaired balance compared to baseline.  Pt demonstrated unsteadiness with higher level balance testing while ambulating.  Thus, recommending OPPT at d/c.  Pt will benefit from skilled PT to increase their independence and safety with mobility to allow discharge to the venue listed below.      Follow Up Recommendations Outpatient PT    Equipment Recommendations  None recommended by PT    Recommendations for Other Services       Precautions / Restrictions Precautions Precautions: Fall Restrictions Weight Bearing Restrictions: No      Mobility  Bed Mobility Overal bed mobility: Independent             General bed mobility comments: No physical assist or cues needed.  Pt performs independently.   Transfers Overall transfer level: Needs assistance Equipment used: None Transfers: Sit to/from Stand Sit to Stand: Min guard         General transfer comment: Pt demonstrates mild instability but no LOB  Ambulation/Gait Ambulation/Gait assistance: Min guard Gait Distance (Feet): 160 Feet Assistive device: None Gait Pattern/deviations: Decreased step length - right;Decreased step length - left Gait velocity: decreased   General Gait Details: Pt ambulates with guarded posture and mild instability with balance  testing.  No LOB.    Stairs            Wheelchair Mobility    Modified Rankin (Stroke Patients Only) Modified Rankin (Stroke Patients Only) Pre-Morbid Rankin Score: No symptoms Modified Rankin: No significant disability(pt reports some lingering dizziness)     Balance Overall balance assessment: Needs assistance;History of Falls Sitting-balance support: No upper extremity supported;Feet supported Sitting balance-Leahy Scale: Good     Standing balance support: No upper extremity supported;During functional activity Standing balance-Leahy Scale: Fair Standing balance comment: Pt becomes unsteady with higher level balance testing Single Leg Stance - Right Leg: 3 Single Leg Stance - Left Leg: 2   Tandem Stance - Left Leg: 4 Rhomberg - Eyes Opened: 30 Rhomberg - Eyes Closed: 12     Standardized Balance Assessment Standardized Balance Assessment : Dynamic Gait Index   Dynamic Gait Index Level Surface: Mild Impairment Change in Gait Speed: Normal Gait with Horizontal Head Turns: Mild Impairment Gait with Vertical Head Turns: Mild Impairment Gait and Pivot Turn: Mild Impairment Step Over Obstacle: Mild Impairment Step Around Obstacles: Mild Impairment       Pertinent Vitals/Pain Pain Assessment: No/denies pain    Home Living Family/patient expects to be discharged to:: Private residence Living Arrangements: Spouse/significant other Available Help at Discharge: Family;Available 24 hours/day Type of Home: House Home Access: Stairs to enter Entrance Stairs-Rails: None Entrance Stairs-Number of Steps: 1 Home Layout: Two level;Able to live on main level with bedroom/bathroom Home Equipment: Shower seat - built in      Prior Function Level of Independence: Independent  Comments: Ambulating without AD.  1 fall in the past 6 months (1 day PTA).  Ind with ADLs, still driving.      Hand Dominance   Dominant Hand: Right    Extremity/Trunk Assessment    Upper Extremity Assessment Upper Extremity Assessment: Overall WFL for tasks assessed(Strength, coordination, sensation WNL)    Lower Extremity Assessment Lower Extremity Assessment: Overall WFL for tasks assessed       Communication   Communication: No difficulties  Cognition Arousal/Alertness: Awake/alert Behavior During Therapy: WFL for tasks assessed/performed Overall Cognitive Status: Within Functional Limits for tasks assessed                                        General Comments      Exercises     Assessment/Plan    PT Assessment Patient needs continued PT services  PT Problem List Decreased balance;Decreased safety awareness       PT Treatment Interventions DME instruction;Gait training;Stair training;Therapeutic exercise;Balance training;Neuromuscular re-education;Patient/family education    PT Goals (Current goals can be found in the Care Plan section)  Acute Rehab PT Goals Patient Stated Goal: to return home PT Goal Formulation: With patient Time For Goal Achievement: 12/22/18 Potential to Achieve Goals: Good    Frequency 7X/week   Barriers to discharge        Co-evaluation               AM-PAC PT "6 Clicks" Mobility  Outcome Measure Help needed turning from your back to your side while in a flat bed without using bedrails?: None Help needed moving from lying on your back to sitting on the side of a flat bed without using bedrails?: None Help needed moving to and from a bed to a chair (including a wheelchair)?: A Little Help needed standing up from a chair using your arms (e.g., wheelchair or bedside chair)?: A Little Help needed to walk in hospital room?: A Little Help needed climbing 3-5 steps with a railing? : A Little 6 Click Score: 20    End of Session Equipment Utilized During Treatment: Gait belt Activity Tolerance: Patient tolerated treatment well Patient left: in chair;with call bell/phone within reach;with  family/visitor present(pt agrees to have nursing staff with her for out of chair) Nurse Communication: Mobility status;Other (comment)(no chair alarm box in room) PT Visit Diagnosis: Unsteadiness on feet (R26.81)    Time: 9417-4081 PT Time Calculation (min) (ACUTE ONLY): 30 min   Charges:   PT Evaluation $PT Eval Low Complexity: 1 Low PT Treatments $Gait Training: 8-22 mins       Collie Siad PT, DPT 12/08/2018, 4:02 PM

## 2018-12-09 ENCOUNTER — Inpatient Hospital Stay (HOSPITAL_COMMUNITY)
Admit: 2018-12-09 | Discharge: 2018-12-09 | Disposition: A | Discharge status: Home / Self Care | Payer: Medicare Other | Attending: Neurology | Admitting: Neurology

## 2018-12-09 ENCOUNTER — Other Ambulatory Visit: Payer: Self-pay | Admitting: Internal Medicine

## 2018-12-09 DIAGNOSIS — I10 Essential (primary) hypertension: Secondary | ICD-10-CM | POA: Diagnosis not present

## 2018-12-09 DIAGNOSIS — I639 Cerebral infarction, unspecified: Secondary | ICD-10-CM | POA: Diagnosis not present

## 2018-12-09 LAB — LIPID PANEL
Cholesterol: 199 mg/dL (ref 0–200)
HDL: 45 mg/dL (ref >40)
LDL Cholesterol: 132 mg/dL — ABNORMAL HIGH (ref 0–99)
Total CHOL/HDL Ratio: 4.4 RATIO
Triglycerides: 110 mg/dL (ref <150)
VLDL: 22 mg/dL (ref 0–40)

## 2018-12-09 LAB — HEMOGLOBIN A1C
Hgb A1c MFr Bld: 5.4 % (ref 4.8–5.6)
Mean Plasma Glucose: 108.28 mg/dL

## 2018-12-09 MED ORDER — ATORVASTATIN CALCIUM 40 MG PO TABS: 40.0000 mg | ORAL_TABLET | Freq: Every day | ORAL | 0 refills | Status: DC

## 2018-12-09 MED ORDER — ASPIRIN 81 MG PO TBEC: 81.0000 mg | DELAYED_RELEASE_TABLET | Freq: Every day | ORAL | 0 refills | Status: AC

## 2018-12-09 MED ORDER — CLOPIDOGREL BISULFATE 75 MG PO TABS: 75.0000 mg | ORAL_TABLET | Freq: Every day | ORAL | 0 refills | Status: DC

## 2018-12-09 NOTE — Care Management Note (Signed)
Case Management Note  Patient Details  Name: Shannon [name] MRN: [id number] Date of Birth: 11-04-1946  Subjective/Objective: Patient to be discharged per MD order. Orders in place for home health services. Patient would like to utilize outpatient physical therapy but may have some travel limitations so will ultimately start with home health especially since there is a speech therapy need as well. CMS Medicare.gov Compare Post Acute Care list reviewed with patient and she prefers [facility]. Referral placed with [place] who is able to accommodate those services. Patient has no DME needs. Family to transport.                       Action/Plan:   Expected Discharge Date:  12/09/18               Expected Discharge Plan:     In-House Referral:     Discharge planning Services  CM Consult  Post Acute Care Choice:    Choice offered to:     DME Arranged:    DME Agency:     HH Arranged:  Speech Therapy [place] Agency:  [facility]  Status of Service:  Completed, signed off  If discussed at [place] of Stay Meetings, dates discussed:    Additional Comments:  Shantasia Hunnell A Truman Aceituno, RN 12/09/2018, 1:30 PM

## 2018-12-09 NOTE — Discharge Summary (Signed)
[facility] NAME: Shannon [name]    MR#:  [id number]  DATE OF BIRTH:  1946/08/03  DATE OF ADMISSION:  12/08/2018   ADMITTING PHYSICIAN: Saundra Shelling, MD  DATE OF DISCHARGE: 12/09/2018  3:18 PM  PRIMARY CARE PHYSICIAN: Donalynn Furlong, MD   ADMISSION DIAGNOSIS:  Cerebrovascular accident (CVA), unspecified mechanism ([place]) [I63.9] DISCHARGE DIAGNOSIS:  Active Problems:   CVA (cerebral vascular accident) ([place])  SECONDARY DIAGNOSIS:   Past Medical History:  Diagnosis Date  . Hypertension   . Thyroid disease    HOSPITAL COURSE:   Shannon [name] is a 72 year old female who presented to the ED with malaise weakness and headache.  She also noted some slowness of speech but no facial droop.  In the ED, CT head showed infarct in the right basal ganglia.  She was admitted for further management.  MRI brain showed an acute right lentiform nucleus infarct, in addition to high-grade right M1 stenosis.  She was seen by neurology, who recommended aspirin, Plavix, and statin.  Her LDL was elevated to 132.  Echo was pending at the time of discharge. PT recommended outpatient PT.  She was discharged home with close PCP and neurology follow-up.  DISCHARGE CONDITIONS:  Acute CVA Hypertension Hypothyroidism CONSULTS OBTAINED:  Treatment Team:  Catarina Hartshorn, MD DRUG ALLERGIES:   Allergies  Allergen Reactions  . Other Palpitations    SINUTAB  . Sulfa Antibiotics Other (See Comments)    Tongue turned black  . Mandol [Cefamandole] Rash   DISCHARGE MEDICATIONS:   Allergies as of 12/09/2018      Reactions   Other Palpitations   SINUTAB   Sulfa Antibiotics Other (See Comments)   Tongue turned black   Mandol [cefamandole] Rash      Medication List    STOP taking these medications   aspirin 325 MG tablet Replaced by:  aspirin 81 MG EC tablet   meclizine 25 MG tablet Commonly known as:  ANTIVERT     TAKE these medications     alendronate 70 MG tablet Commonly known as:  FOSAMAX Take 1 tablet by mouth once a week.   alendronate 10 MG tablet Commonly known as:  FOSAMAX Take 1 tablet by mouth daily.   aspirin 81 MG EC tablet Take 1 tablet (81 mg total) by mouth daily. Start taking on:  December 10, 2018 Replaces:  aspirin 325 MG tablet   atorvastatin 40 MG tablet Commonly known as:  LIPITOR Take 1 tablet (40 mg total) by mouth daily at 6 PM.   calcium carbonate 600 MG tablet Commonly known as:  OS-CAL Take 1 tablet by mouth daily.   calcium citrate-vitamin D 315-200 MG-UNIT tablet Commonly known as:  CITRACAL+D Take 1 tablet by mouth daily.   clonazePAM 0.5 MG tablet Commonly known as:  KLONOPIN Take 1 tablet by mouth daily as needed.   clopidogrel 75 MG tablet Commonly known as:  PLAVIX Take 1 tablet (75 mg total) by mouth daily. Start taking on:  December 10, 2018   famotidine-calcium carbonate-magnesium hydroxide 10-800-165 MG chewable tablet Commonly known as:  PEPCID COMPLETE Chew 1 tablet by mouth daily as needed.   GAVISCON PO Take 5 mLs by mouth daily as needed.   levothyroxine 75 MCG tablet Commonly known as:  SYNTHROID, LEVOTHROID Take 1 tablet by mouth daily.   levothyroxine 50 MCG tablet Commonly known as:  SYNTHROID, LEVOTHROID Take 1 tablet by mouth daily.   lisinopril 10 MG tablet Commonly  known as:  PRINIVIL,ZESTRIL Take 1 tablet by mouth daily.   multivitamin capsule Take 1 capsule by mouth daily.   ranitidine 150 MG tablet Commonly known as:  ZANTAC Take 1 tablet by mouth daily.   vitamin E 400 UNIT capsule Take 1 capsule by mouth daily.        DISCHARGE INSTRUCTIONS:  1.  Follow-up with PCP in 5 days 2.  Follow-up with neurology in 1 to 2 weeks 3.  Start aspirin, Plavix, and Lipitor daily 4.  Follow-up echo, which was pending at the time of discharge DIET:  Cardiac diet DISCHARGE CONDITION:  Stable ACTIVITY:  Activity as tolerated OXYGEN:   Home Oxygen: No.  Oxygen Delivery: room air DISCHARGE LOCATION:  home   If you experience worsening of your admission symptoms, develop shortness of breath, life threatening emergency, suicidal or homicidal thoughts you must seek medical attention immediately by calling 911 or calling your MD immediately  if symptoms less severe.  You Must read complete instructions/literature along with all the possible adverse reactions/side effects for all the Medicines you take and that have been prescribed to you. Take any new Medicines after you have completely understood and accpet all the possible adverse reactions/side effects.   Please note  You were cared for by a hospitalist during your hospital stay. If you have any questions about your discharge medications or the care you received while you were in the hospital after you are discharged, you can call the unit and asked to speak with the hospitalist on call if the hospitalist that took care of you is not available. Once you are discharged, your primary care physician will handle any further medical issues. Please note that NO REFILLS for any discharge medications will be authorized once you are discharged, as it is imperative that you return to your primary care physician (or establish a relationship with a primary care physician if you do not have one) for your aftercare needs so that they can reassess your need for medications and monitor your lab values.    On the day of Discharge:  VITAL SIGNS:  Blood pressure (!) 150/72, pulse 97, temperature 98.4 F (36.9 C), temperature source Oral, resp. rate 18, height 5\' 5"  (1.651 m), weight 51.7 kg, SpO2 97 %. PHYSICAL EXAMINATION:  GENERAL:  72 y.o.-year-old patient lying in the bed with no acute distress.  EYES: Pupils equal, round, reactive to light and accommodation. No scleral icterus. Extraocular muscles intact.  HEENT: Head atraumatic, normocephalic. Oropharynx and nasopharynx clear.  NECK:   Supple, no jugular venous distention. No thyroid enlargement, no tenderness.  LUNGS: Normal breath sounds bilaterally, no wheezing, rales,rhonchi or crepitation. No use of accessory muscles of respiration.  CARDIOVASCULAR: S1, S2 normal. No murmurs, rubs, or gallops.  ABDOMEN: Soft, non-tender, non-distended. Bowel sounds present. No organomegaly or mass.  EXTREMITIES: No pedal edema, cyanosis, or clubbing.  NEUROLOGIC: Cranial nerves II through XII are intact. + Mild global weakness. Sensation intact. Gait not checked.   PSYCHIATRIC: The patient is alert and oriented x 3.  SKIN: No obvious rash, lesion, or ulcer.  DATA REVIEW:   CBC Recent Labs  Lab 12/08/18 0929  WBC 4.6  HGB 13.4  HCT 39.0  PLT 208    Chemistries  Recent Labs  Lab 12/08/18 0929  NA 138  K 3.9  CL 105  CO2 26  GLUCOSE 88  BUN 15  CREATININE 0.61  CALCIUM 9.7  AST 24  ALT 21  ALKPHOS 73  BILITOT  0.5     Microbiology Results  No results found for this or any previous visit.  RADIOLOGY:  US Carotid Bilateral (at [facility] Only)  Result Date: 12/09/2018 CLINICAL DATA:  Acute CVA. EXAM: BILATERAL CAROTID DUPLEX ULTRASOUND TECHNIQUE: Pearline Cables scale imaging, color Doppler and duplex ultrasound were performed of bilateral carotid and vertebral arteries in the neck. COMPARISON:  Brain MRI 12/08/2018 FINDINGS: Criteria: Quantification of carotid stenosis is based on velocity parameters that correlate the residual internal carotid diameter with NASCET-based stenosis levels, using the diameter of the distal internal carotid lumen as the denominator for stenosis measurement. The following velocity measurements were obtained: RIGHT ICA: 110 cm/sec CCA: 81 cm/sec SYSTOLIC ICA/CCA RATIO:  1.4 ECA: 185 cm/sec LEFT ICA: 134 cm/sec CCA: 454 cm/sec SYSTOLIC ICA/CCA RATIO:  1.3 ECA: 156 cm/sec RIGHT CAROTID ARTERY: Minimal plaque at the right carotid bulb and origin of the external carotid artery. External carotid artery is  patent with normal waveform. Normal waveforms and velocities in the internal carotid artery. RIGHT VERTEBRAL ARTERY: Antegrade flow and normal waveform in the right vertebral artery. LEFT CAROTID ARTERY: Small amount of plaque in the distal common carotid artery and bulb. External carotid artery is patent with normal waveform. Slightly elevated velocity in the distal internal carotid artery is probably related to tortuosity. No significant plaque or stenosis in the left internal carotid artery. LEFT VERTEBRAL ARTERY: Antegrade flow and normal waveform in the left vertebral artery. IMPRESSION: Minimal plaque in the carotid arteries. Estimated degree of stenosis in the internal carotid arteries is less than 50% bilaterally. Patent vertebral arteries with antegrade flow. Electronically Signed   By: Markus Daft M.D.   On: 12/09/2018 08:09     Management plans discussed with the patient, family and they are in agreement.  CODE STATUS: Full Code   TOTAL TIME TAKING CARE OF THIS PATIENT: 35 minutes.    Berna Spare Atif Chapple M.D on 12/09/2018 at 5:40 PM  Between 7am to 6pm - Pager - [phone]  After 6pm go to [email or web address] - [employer] [facility]  Office  [phone]  CC: Primary care physician; Donalynn Furlong, MD   Note: This dictation was prepared with Dragon dictation along with smaller phrase technology. Any transcriptional errors that result from this process are unintentional.

## 2018-12-09 NOTE — Plan of Care (Signed)
SWOT nurse did d/c instruction and removed IV.  Pt d/ced to home with home health.  Wanted her to get speech follow up since they weren't available today.  Did ECHO bubble study.  Pt's only deficit was L sided facial droop.  Pt going home with husband and will f/u with PCP and neurologist. Going home on 81 mg asa and plavix. Only lab outside of normal was LDL of 132.  NIH = 1.

## 2018-12-09 NOTE — Evaluation (Signed)
Occupational Therapy Evaluation Patient Details Name: Shannon [name] MRN: [id number] DOB: 10-06-1946 Today's Date: 12/09/2018    History of Present Illness Pt is a 72 y/o F who presented with 1 wk h/o generalized weakness, headache, slowness of speech.  CT head showed infarct in R basal ganglia.  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    Clinical Impression   Pt evaluated at bedside - pt denies any pain , vision changes or weakness in bilateral UE - show bilateral UE ROM , strength and coordination WNL. Pt is independent in ADL's with supervision - ambulate to bathroom with supervision - was little unsteady with turning in bathroom - would recommend outpt for high balance activities to decrease risk for falling, No OT services indicated at this time     Follow Up Recommendations  No OT follow up    Equipment Recommendations       Recommendations for Other Services  PT outpt for balance      Precautions / Restrictions Precautions Precautions: Fall Restrictions Weight Bearing Restrictions: No      Mobility Bed Mobility                  Transfers                      Balance Overall balance assessment: (S to CG to bathroom - little unsteady turning in bathroom )             Standing balance comment: Pt becomes unsteady with higher level balance act in ADL's in room                           ADL either performed or assessed with clinical judgement   ADL Overall ADL's : Independent                                             Vision Baseline Vision/History: No visual deficits;Wears glasses(Visual field normal and field cuts, denies blurry vision or changes )       Perception     Praxis      Pertinent Vitals/Pain Pain Assessment: No/denies pain     Hand Dominance     Extremity/Trunk Assessment Upper Extremity  Assessment Upper Extremity Assessment: (Bilateral UE WNL for ROM, strength and coordination )           Communication     Cognition Arousal/Alertness: Awake/alert Behavior During Therapy: WFL for tasks assessed/performed                                       General Comments       Exercises     Shoulder Instructions      Home Living Family/patient expects to be discharged to:: Private residence Living Arrangements: Spouse/significant other Available Help at Discharge: Family;Available 24 hours/day Type of Home: House Home Access: Stairs to enter           [employer] Shower/Tub: Occupational psychologist: Standard Bathroom Accessibility: Yes   Home Equipment: Shower seat - built in          Prior Functioning/Environment Level of Independence: Independent  Comments: Ambulating without AD.  1 fall in the past 6 months (1 day PTA).  Ind with ADLs, still driving.         OT Problem List:        OT Treatment/Interventions:      OT Goals(Current goals can be found in the care plan section) Acute Rehab OT Goals Patient Stated Goal: to return home OT Goal Formulation: With patient/family  OT Frequency:     Barriers to D/C:            Co-evaluation              AM-PAC OT "6 Clicks" Daily Activity     Outcome Measure Help from another person eating meals?: None Help from another person taking care of personal grooming?: None Help from another person toileting, which includes using toliet, bedpan, or urinal?: A Little(supervision) Help from another person bathing (including washing, rinsing, drying)?: A Little(supervision) Help from another person to put on and taking off regular upper body clothing?: None Help from another person to put on and taking off regular lower body clothing?: None 6 Click Score: 22   End of Session Equipment Utilized During Treatment: Gait belt  Activity Tolerance: Patient tolerated  treatment well Patient left: in bed;with call bell/phone within reach;with bed alarm set;with family/visitor present                   Time: 7471-5953 OT Time Calculation (min): 25 min Charges:  OT General Charges $OT Visit: 1 Visit OT Evaluation $OT Eval Low Complexity: 1 Low    Owin Vignola OTR/L,CLT 12/09/2018, 1:42 PM

## 2018-12-09 NOTE — Discharge Instructions (Signed)
Ischemic Stroke  An ischemic stroke is the sudden death of brain tissue. Blood carries oxygen to all areas of the body. This type of stroke happens when your blood does not flow to your brain like normal. Your brain cannot get the oxygen it needs. This is an emergency. It must be treated right away. Symptoms of a stroke usually happen all of a sudden. You may notice them when you wake up. They can include:  Weakness or loss of feeling in your face, arm, or leg. This often happens on one side of the body.  Trouble walking.  Trouble moving your arms or legs.  Loss of balance or coordination.  Feeling confused.  Trouble talking or understanding what people are saying.  Slurred speech.  Trouble seeing.  Seeing two of one object (double vision).  Feeling dizzy.  Feeling sick to your stomach (nauseous) and throwing up (vomiting).  A very bad headache for no reason. Get help as soon as any of these problems start. This is important. Some treatments work better if they are given right away. These include:  Aspirin.  Medicines to control blood pressure.  A shot (injection) of medicine to break up the blood clot.  Treatments given in the blood vessel (artery) to take out the clot or break it up. Other treatments may include:  Oxygen.  Fluids given through an IV tube.  Medicines to thin out your blood.  Procedures to help your blood flow better. What increases the risk? Certain things may make you more likely to have a stroke. Some of these are things that you can change, such as:  Being very overweight (obesity).  Smoking.  Taking birth control pills.  Not being active.  Drinking too much alcohol.  Using drugs. Other risk factors include:  High blood pressure.  High cholesterol.  Diabetes.  Heart disease.  Being [place] American, Native American, Hispanic, or [place] Native.  Being over age 57.  Family history of stroke.  Having had blood clots,  stroke, or warning stroke (transient ischemic attack, TIA) in the past.  Sickle cell disease.  Being a woman with a history of high blood pressure in pregnancy (preeclampsia).  Migraine headache.  Sleep apnea.  Having an irregular heartbeat (atrial fibrillation).  Long-term (chronic) diseases that cause soreness and swelling (inflammation).  Disorders that affect how your blood clots. Follow these instructions at home: Medicines  Take over-the-counter and prescription medicines only as told by your doctor.  If you were told to take aspirin or another medicine to thin your blood, take it exactly as told by your doctor. ? Taking too much of the medicine can cause bleeding. ? If you do not take enough, it may not work as well.  Know the side effects of your medicines. If you are taking a blood thinner, make sure you: ? Hold pressure over any cuts for longer than usual. ? Tell your dentist and other doctors that you take this medicine. ? Avoid activities that may cause damage or injury to your body. Eating and drinking  Follow instructions from your doctor about what you cannot eat or drink.  Eat healthy foods.  If you have trouble with swallowing, do these things to avoid choking: ? Take small bites when eating. ? Eat foods that are soft or pureed. Safety  Follow instructions from your health care team about physical activity.  Use a walker or cane as told by your doctor.  Keep your home safe so you do not  fall. This may include: ? Having experts look at your home to make sure it is safe. ? Putting grab bars in the bedroom and bathroom. ? Using raised toilets. ? Putting a seat in the shower. General instructions  Do not use any tobacco products. ? Examples of these are cigarettes, chewing tobacco, and e-cigarettes. ? If you need help quitting, ask your doctor.  Limit how much alcohol you drink. This means no more than 1 drink a day for nonpregnant women and 2 drinks  a day for men. One drink equals 12 oz of beer, 5 oz of wine, or 1 oz of hard liquor.  If you need help to stop using drugs or alcohol, ask your doctor to refer you to a program or specialist.  Stay active. Exercise as told by your doctor.  Keep all follow-up visits as told by your doctor. This is important. Get help right away if:   You have any signs of a stroke. "BE FAST" is an easy way to remember the main warning signs: ? B - Balance. Signs are dizziness, sudden trouble walking, or loss of balance. ? E - Eyes. Signs are trouble seeing or a change in how you see. ? F - Face. Signs are sudden weakness or loss of feeling of the face, or the face or eyelid drooping on one side. ? A - Arms. Signs are weakness or loss of feeling in an arm. This happens suddenly and usually on one side of the body. ? S - Speech. Signs are sudden trouble speaking, slurred speech, or trouble understanding what people say. ? T - Time. Time to call emergency services. Write down what time symptoms started.  You have other signs of a stroke, such as: ? A sudden, very bad headache with no known cause. ? Feeling sick to your stomach (nausea). ? Throwing up (vomiting). ? Jerky movements you cannot control (seizure). These symptoms may be an emergency. Do not wait to see if the symptoms will go away. Get medical help right away. Call your local emergency services (911 in the [place] Do not drive yourself to the hospital. Summary  An ischemic stroke is the sudden death of brain tissue.  Symptoms of a stroke usually happen all of a sudden. You may notice them when you wake up.  Get help if you have any warning signs of a stroke. This is important. Some treatments work better if they are given right away. This information is not intended to replace advice given to you by your health care provider. Make sure you discuss any questions you have with your health care provider. Document Released: 11/18/2011 Document  Revised: 05/10/2018 Document Reviewed: 02/25/2016 Elsevier Interactive Patient Education  [employer]. It was so nice to meet you during this hospitalization!  You had a stroke. You were seen by the neurologist. We started 3 new medications to help prevent future strokes: 1. Please take aspirin 81mg  every day 2. Please take plavix 75mg  every day 3. Please take lipitor 40mg  every day  Take care, Dr. Brett Albino

## 2018-12-09 NOTE — Care Management (Signed)
PT recommending outpatient PT. Patient is agreeable. Referral form signed by MD and faxed to [facility] rehab dept.  Ines Bloomer RN BSN RNCM [phone]

## 2018-12-09 NOTE — Progress Notes (Addendum)
Subjective: Patient unchanged without new neurological complaints.    Objective: Current vital signs: BP (!) 155/62   Pulse 72   Temp 98 F (36.7 C) (Oral)   Resp 18   Ht 5\' 5"  (1.651 m)   Wt 51.7 kg   SpO2 100%   BMI 18.97 kg/m  Vital signs in last 24 hours: Temp:  [97.7 F (36.5 C)-98.6 F (37 C)] 98 F (36.7 C) (12/28 0423) Pulse Rate:  [72-102] 72 (12/28 0423) Resp:  [18-21] 18 (12/27 1815) BP: (119-202)/(54-92) 155/62 (12/28 0423) SpO2:  [95 %-100 %] 100 % (12/28 0423) Weight:  [51.7 kg] 51.7 kg (12/27 1143)  Intake/Output from previous day: 12/27 0701 - 12/28 0700 In: 240 [P.O.:240] Out: -  Intake/Output this shift: No intake/output data recorded. Nutritional status:  Diet Order            Diet Heart Room service appropriate? Yes; Fluid consistency: Thin  Diet effective now              Neurologic Exam: Mental Status: Alert, oriented, thought content appropriate.  Speech fluent without evidence of aphasia.  Able to follow 3 step commands without difficulty. Cranial Nerves: II: Discs flat bilaterally; Visual fields grossly normal, pupils equal, round, reactive to light and accommodation III,IV, VI: ptosis not present, extra-ocular motions intact bilaterally V,VII: decrease in right NLF, facial light touch sensation normal bilaterally VIII: hearing normal bilaterally IX,X: gag reflex present XI: bilateral shoulder shrug XII: right tongue deviation Motor: 5/5 throughout  Lab Results: Basic Metabolic Panel: Recent Labs  Lab 12/08/18 0929  NA 138  K 3.9  CL 105  CO2 26  GLUCOSE 88  BUN 15  CREATININE 0.61  CALCIUM 9.7    Liver Function Tests: Recent Labs  Lab 12/08/18 0929  AST 24  ALT 21  ALKPHOS 73  BILITOT 0.5  PROT 7.1  ALBUMIN 4.3   No results for input(s): LIPASE, AMYLASE in the last 168 hours. No results for input(s): AMMONIA in the last 168 hours.  CBC: Recent Labs  Lab 12/08/18 0929  WBC 4.6  NEUTROABS 2.6  HGB 13.4   HCT 39.0  MCV 90.9  PLT 208    Cardiac Enzymes: Recent Labs  Lab 12/08/18 0929  TROPONINI <0.03    Lipid Panel: Recent Labs  Lab 12/09/18 0352  CHOL 199  TRIG 110  HDL 45  CHOLHDL 4.4  VLDL 22  LDLCALC 132*    CBG: No results for input(s): GLUCAP in the last 168 hours.  Microbiology: No results found for this or any previous visit.  Coagulation Studies: No results for input(s): LABPROT, INR in the last 72 hours.  Imaging: Ct Head Wo Contrast  Result Date: 12/08/2018 CLINICAL DATA:  Right-sided headache for 6 days. Fatigue. Right-sided mouth weakness EXAM: CT HEAD WITHOUT CONTRAST TECHNIQUE: Contiguous axial images were obtained from the base of the skull through the vertex without intravenous contrast. COMPARISON:  Head CT and brain MRI October 12, 2017 FINDINGS: Brain: The ventricles are normal in size and configuration. There is no intracranial mass, hemorrhage, extra-axial fluid collection, or midline shift. There is decreased attenuation involving much of the right lentiform nucleus as well as involving portions of the right internal and external capsule, concerning for acute right basal ganglia infarct. There is evidence of a prior infarct involving the anterior limb of the left external capsule, stable. Brain parenchyma otherwise appears unremarkable. Vascular: No hyperdense vessel. There is calcification in each carotid siphon region. Skull: The bony  calvarium appears intact. Sinuses/Orbits: There is mild mucosal thickening in several ethmoid air cells. Other visualized paranasal sinuses are clear. Visualized orbits appear symmetric bilaterally. Other: Mastoid air cells are clear. IMPRESSION: Apparent acute infarct involving much of the right basal ganglia. Localized cytotoxic edema in this area noted. Prior infarct anterior limb left external capsule. No mass or hemorrhage. There are foci of arterial vascular calcification. There is mild mucosal thickening in several  ethmoid air cells. Electronically Signed   By: Lowella Grip III M.D.   On: 12/08/2018 09:42   Mr Brain Wo Contrast  Result Date: 12/08/2018 CLINICAL DATA:  Stroke follow-up EXAM: MRI HEAD WITHOUT CONTRAST MRA HEAD WITHOUT CONTRAST TECHNIQUE: Multiplanar, multiecho pulse sequences of the brain and surrounding structures were obtained without intravenous contrast. Angiographic images of the head were obtained using MRA technique without contrast. COMPARISON:  10/12/2017 brain MRI FINDINGS: MRI HEAD FINDINGS Brain: Wedge of restricted diffusion extending from the upper right putamen across the corona radiata into the right caudate body. Remote left lenticulostriate infarct that is more anteriorly positioned. Small remote high and posterior right frontal cortex infarct. No acute hemorrhage, hydrocephalus, collection, or masslike finding. Vascular: Arterial findings below. Skull and upper cervical spine: Negative for marrow lesion Sinuses/Orbits: Negative MRA HEAD FINDINGS The carotid arteries are symmetric and diffusely patent. There is a moderate stenosis at the supraclinoid ICA on the left. Advanced distal right M1 segment stenosis which likely also continues into a proximal M2 branch. There is less full MCA branches on the right. The vertebral and basilar arteries are smooth and widely patent. Symmetric robust flow in posterior cerebral arteries. Negative for aneurysm IMPRESSION: 1. 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 2. Remote left basal ganglia infarct. 3. Small remote right frontal cortex infarct. 4. Moderate left supraclinoid ICA narrowing. Electronically Signed   By: Monte Fantasia M.D.   On: 12/08/2018 14:11   US Carotid Bilateral (at [facility] Only)  Result Date: 12/09/2018 CLINICAL DATA:  Acute CVA. EXAM: BILATERAL CAROTID DUPLEX ULTRASOUND TECHNIQUE: Pearline Cables scale imaging, color Doppler and duplex ultrasound were performed of bilateral  carotid and vertebral arteries in the neck. COMPARISON:  Brain MRI 12/08/2018 FINDINGS: Criteria: Quantification of carotid stenosis is based on velocity parameters that correlate the residual internal carotid diameter with NASCET-based stenosis levels, using the diameter of the distal internal carotid lumen as the denominator for stenosis measurement. The following velocity measurements were obtained: RIGHT ICA: 110 cm/sec CCA: 81 cm/sec SYSTOLIC ICA/CCA RATIO:  1.4 ECA: 185 cm/sec LEFT ICA: 134 cm/sec CCA: 546 cm/sec SYSTOLIC ICA/CCA RATIO:  1.3 ECA: 156 cm/sec RIGHT CAROTID ARTERY: Minimal plaque at the right carotid bulb and origin of the external carotid artery. External carotid artery is patent with normal waveform. Normal waveforms and velocities in the internal carotid artery. RIGHT VERTEBRAL ARTERY: Antegrade flow and normal waveform in the right vertebral artery. LEFT CAROTID ARTERY: Small amount of plaque in the distal common carotid artery and bulb. External carotid artery is patent with normal waveform. Slightly elevated velocity in the distal internal carotid artery is probably related to tortuosity. No significant plaque or stenosis in the left internal carotid artery. LEFT VERTEBRAL ARTERY: Antegrade flow and normal waveform in the left vertebral artery. IMPRESSION: Minimal plaque in the carotid arteries. Estimated degree of stenosis in the internal carotid arteries is less than 50% bilaterally. Patent vertebral arteries with antegrade flow. Electronically Signed   By: Markus Daft M.D.   On:  12/09/2018 08:09   Mr Jodene Nam Head/brain TJ Cm  Result Date: 12/08/2018 CLINICAL DATA:  Stroke follow-up EXAM: MRI HEAD WITHOUT CONTRAST MRA HEAD WITHOUT CONTRAST TECHNIQUE: Multiplanar, multiecho pulse sequences of the brain and surrounding structures were obtained without intravenous contrast. Angiographic images of the head were obtained using MRA technique without contrast. COMPARISON:  10/12/2017 brain MRI  FINDINGS: MRI HEAD FINDINGS Brain: Wedge of restricted diffusion extending from the upper right putamen across the corona radiata into the right caudate body. Remote left lenticulostriate infarct that is more anteriorly positioned. Small remote high and posterior right frontal cortex infarct. No acute hemorrhage, hydrocephalus, collection, or masslike finding. Vascular: Arterial findings below. Skull and upper cervical spine: Negative for marrow lesion Sinuses/Orbits: Negative MRA HEAD FINDINGS The carotid arteries are symmetric and diffusely patent. There is a moderate stenosis at the supraclinoid ICA on the left. Advanced distal right M1 segment stenosis which likely also continues into a proximal M2 branch. There is less full MCA branches on the right. The vertebral and basilar arteries are smooth and widely patent. Symmetric robust flow in posterior cerebral arteries. Negative for aneurysm IMPRESSION: 1. 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 2. Remote left basal ganglia infarct. 3. Small remote right frontal cortex infarct. 4. Moderate left supraclinoid ICA narrowing. Electronically Signed   By: Monte Fantasia M.D.   On: 12/08/2018 14:11    Medications:  I have reviewed the patient's current medications. Scheduled: . aspirin EC  81 mg Oral Daily  . atorvastatin  40 mg Oral q1800  . calcium carbonate  1 tablet Oral Daily  . clopidogrel  75 mg Oral Daily  . [START ON 12/11/2018] levothyroxine  50 mcg Oral Q M,W,F  . levothyroxine  75 mcg Oral Q T,Th,S,Su  . lisinopril  10 mg Oral Daily  . multivitamin with minerals  1 tablet Oral Daily  . vitamin E  400 Units Oral Daily    Assessment/Plan: Patient stable.  MRI of the brain reviewed and shows an acute right lentiform nucleus infarct.  Also noted is a high grade right M1 stenosis.  Etiology likely small vessel atherosclerosis.  Carotid dopplers show no evidence of hemodynamically significant  stenosis.  Echocardiogram pending.  A1c 5.4, LDL 132.    Recommendations: 1.  ASA 81mg  amd Plavix 75mg  daily 2.  Statin for lipid management with target LDL<70. 3.  Echocardiogram pending 4.  Patient to follow up with neurology on an outpatient basis   LOS: 1 day   Alexis Goodell, MD Neurology [phone] 12/09/2018  10:38 AM

## 2018-12-09 NOTE — Plan of Care (Signed)
  Problem: Education: Goal: Knowledge of General Education information will improve Description Including pain rating scale, medication(s)/side effects and non-pharmacologic comfort measures Outcome: Progressing   Problem: Clinical Measurements: Goal: Ability to maintain clinical measurements within normal limits will improve Outcome: Progressing   Problem: Clinical Measurements: Goal: Will remain free from infection Outcome: Progressing   Problem: Clinical Measurements: Goal: Diagnostic test results will improve Outcome: Progressing   Problem: Clinical Measurements: Goal: Respiratory complications will improve Outcome: Progressing   Problem: Clinical Measurements: Goal: Cardiovascular complication will be avoided Outcome: Progressing   Problem: Pain Managment: Goal: General experience of comfort will improve Outcome: Progressing   Problem: Safety: Goal: Ability to remain free from injury will improve Outcome: Progressing   Problem: Pain Managment: Goal: General experience of comfort will improve Outcome: Progressing

## 2018-12-12 ENCOUNTER — Telehealth: Payer: Self-pay

## 2018-12-12 NOTE — Telephone Encounter (Signed)
Flagged on EMMI report for not reading discharge papers and not having a follow up scheduled.  Called and spoke with patient.  She confirmed she was able to schedule follow up appointments.  Will see her PCP on Thursday.  She expressed she was not happy with how far out her neurology appointment was set for, however is on a waiting list to see if she can be worked in sooner.  She mentioned she has not heard the results from her echo performed, though thinks Dr. Chrissie Noa will go over when she sees him later this week.  No other questions or concerns at this time.  I thanked her for her time and informed her she would receive one more automated call checking in over the next few days.

## 2018-12-20 ENCOUNTER — Encounter: Payer: Self-pay | Admitting: Physical Therapy

## 2018-12-20 ENCOUNTER — Ambulatory Visit: Payer: Medicare Other | Attending: Family Medicine | Admitting: Physical Therapy

## 2018-12-20 ENCOUNTER — Other Ambulatory Visit: Payer: Self-pay

## 2018-12-20 DIAGNOSIS — R2681 Unsteadiness on feet: Secondary | ICD-10-CM | POA: Diagnosis not present

## 2018-12-20 DIAGNOSIS — M6281 Muscle weakness (generalized): Secondary | ICD-10-CM | POA: Insufficient documentation

## 2018-12-20 NOTE — Therapy (Signed)
[place] MAIN [facility] SERVICES [address] [place], [place], [address] Phone: [phone]   Fax:  [phone]  Physical Therapy Evaluation  Patient Details  Name: Shannon [name] MRN: [id number] Date of Birth: Mar 02, 1946 Referring Provider (PT): Donalynn Furlong, MD   Encounter Date: 12/20/2018  PT End of Session - 12/20/18 1435    Visit Number  1    Number of Visits  9    Date for PT Re-Evaluation  01/17/19    Authorization Type  1/10, evaluation 12/20/18    PT Start Time  1401    PT Stop Time  1455    PT Time Calculation (min)  54 min    Equipment Utilized During Treatment  Gait belt    Activity Tolerance  Patient tolerated treatment well;No increased pain    Behavior During Therapy  WFL for tasks assessed/performed       Past Medical History:  Diagnosis Date  . Hypertension   . Thyroid disease     Past Surgical History:  Procedure Laterality Date  . ABDOMINAL HYSTERECTOMY    . APPENDECTOMY    . CARPAL TUNNEL RELEASE Bilateral   . DILATION AND CURETTAGE OF UTERUS    . OVARIAN CYST REMOVAL      There were no vitals filed for this visit.   Subjective Assessment - 12/20/18 1404    Subjective  I am just really fatigued. I went to get a shower and wash my hair and I had a hard time with a lot of fatigue in my left arm. I don't have any numbness on that side except when I sleep on my left side.     Pertinent History  73 yo Female s/p CVA on 12/02/18. She was out of town in [place] and wanted to get home. She reports that leading up to Christmas she noticed that she was having trouble with her balance. When she was trying to put up some dishes she just fell over to the floor on the left side. Currently she re=ports increased fatigue and imbalance. Patient presents to therapy without AD. She is able to ambulate without assistive device but does have some missteps; In addition, she reports increased stiffness in suboccipitals; She denies any  numbness/tingling;      Limitations  Standing;Walking    How long can you sit comfortably?  NA    How long can you stand comfortably?  15-20 min    How long can you walk comfortably?  1 1/4 mile; walked the dog this morning; reported increased fatigue; reports left leg feeling heavy with prolonged walking;     Diagnostic tests  MRI shows right basal ganglia infarct    Patient Stated Goals  Work back up to walking 2 miles daily    Currently in Pain?  No/denies    Multiple Pain Sites  No         [facility] PT Assessment - 12/20/18 0001      Assessment   Medical Diagnosis  s/p CVA    Referring Provider (PT)  Donalynn Furlong, MD    Onset Date/Surgical Date  12/02/18    Hand Dominance  Right    Next MD Visit  next week    Prior Therapy  denies any PT for this condition;       Precautions   Precautions  Fall      Restrictions   Weight Bearing Restrictions  No      Balance Screen   Has the  patient fallen in the past 6 months  Yes    How many times?  1    Has the patient had a decrease in activity level because of a fear of falling?   Yes    Is the patient reluctant to leave their home because of a fear of falling?   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, socialize      Cognition   Overall Cognitive Status  Difficult to assess   was having some memory trouble before;    Attention  --   will get headache with prolonged concentration     Observation/Other Assessments   Observations  Cranial nerves- present and intact all 12    Skin Integrity  intact in visualized areas    Lower Extremity  Functional Scale   50/80 (the  higher the number the better functional mobility)      Sensation   Light Touch  Appears Intact      Coordination   Gross Motor Movements are Fluid and Coordinated  Yes    Finger Nose Finger Test  accurate bilaterally;      Posture/Postural Control   Posture Comments  able to exhibit erect posture      AROM   Overall AROM Comments  BUE and BLE are [facility]      Strength   Right Hand Grip (lbs)  45    Left Hand Grip (lbs)  25    Right Hip Flexion  5/5    Right Hip ABduction  4/5    Right Hip ADduction  4/5    Left Hip Flexion  4-/5    Left Hip ABduction  4-/5    Left Hip ADduction  4/5    Right Knee Flexion  4/5    Right Knee Extension  5/5    Left Knee Flexion  4-/5    Left Knee Extension  5/5    Right/Left Ankle  Right;Left    Right Ankle Dorsiflexion  4/5    Left Ankle Dorsiflexion  4/5      Transfers   Comments  able to transfer sit<>stand without pushing on chair, mod I      Ambulation/Gait   Gait Comments  ambulates without AD, good gait speed, does have slight veering with head turns/dual tasks; able to exhibit adequate foot clearance during ambulation      6 Minute Walk- Baseline   6 Minute Walk- Baseline  yes    BP (mmHg)  134/59    HR (bpm)  89    02 Sat (%RA)  99 %      6 Minute walk- Post Test   6 Minute Walk Post Test  yes    BP (mmHg)  176/52    HR (bpm)  80    02 Sat (%RA)  100 %      6 minute walk test results    Aerobic Endurance Distance Walked  1480    Endurance additional comments  without AD, slightly lower  than age group norms of 1500 feet      Standardized Balance Assessment   10 Meter Walk  1.15 m/s without AD, community ambulator      Functional Gait  Assessment   Gait assessed   Yes    Gait Level Surface  Walks 20 ft in less than 5.5 sec, no assistive devices, good speed, no evidence for imbalance, normal gait pattern, deviates no more than 6 in outside of the 12 in walkway width.    Change in Gait Speed   Able to smoothly change walking speed without loss of balance or gait deviation. Deviate no more than 6 in outside of the 12 in walkway width.    Gait with Horizontal Head Turns  Performs head turns smoothly with slight change in gait velocity (eg, minor disruption to smooth gait path), deviates 6-10 in outside 12 in walkway width, or uses an assistive device.    Gait with Vertical Head Turns  Performs task with slight change in gait velocity (eg, minor disruption to smooth gait path), deviates 6 - 10 in outside 12 in walkway width or uses assistive device    Gait and Pivot Turn  Pivot turns safely within 3 sec and stops quickly with no loss of balance.    Step Over Obstacle  Is able to step over one shoe box (4.5 in total height) without changing gait speed. No evidence of imbalance.    Gait with Narrow Base of Support  Ambulates less than 4 steps heel to toe or cannot perform without assistance.    Gait with Eyes Closed  Walks 20 ft, slow speed, abnormal gait pattern, evidence for imbalance, deviates 10-15 in outside 12 in walkway width. Requires more than 9 sec to ambulate 20 ft.    Ambulating Backwards  Walks 20 ft, uses assistive device, slower speed, mild gait deviations, deviates 6-10 in outside 12 in walkway width.    Steps  Alternating feet, must use rail.    Total Score  20    FGA comment:  high risk for falls                Objective measurements completed on examination: See above findings.              PT Education - 12/20/18 1434    Education Details  recommendations/plan of care    Person(s) Educated  Patient    Methods  Explanation    Comprehension  Verbalized understanding         PT Short Term Goals - 12/21/18 0857      PT SHORT TERM GOAL #1   Title  Patient will be adherent to HEP at least 3x a week to improve functional strength and balance for better safety at home.    Time  2    Period  Weeks    Status  New    Target Date  01/04/19      PT  SHORT TERM GOAL #2   Title  Patient will tolerate 5 seconds of single leg stance without loss of balance to improve ability to get in and out of shower safely.    Time  2    Period  Weeks    Status  New    Target Date  01/04/19          PT Long Term Goals - 12/21/18 0907      PT LONG TERM GOAL #1   Title  Patient will increase Functional Gait  Assessment score to >20/30 as to reduce fall risk and improve dynamic gait safety with community ambulation.    Time  4    Period  Weeks    Status  New    Target Date  01/17/19      PT LONG TERM GOAL #2   Title  Patient will increase BLE gross strength to 4+/5 as to improve functional strength for independent gait, increased standing tolerance and increased ADL ability.    Time  4    Period  Weeks    Status  New    Target Date  01/17/19      PT LONG TERM GOAL #3   Title  Patient will increase six minute walk test distance to >1500 for progression to age group norms and improve gait ability    Time  4    Period  Weeks    Status  New    Target Date  01/17/19              Plan - 12/20/18 1435    Clinical Impression Statement  73 yo Female s/p CVA with left sided weakness, mild, and imbalance. Patient is able to ambulate without an assistive device  but does ambulate with some veering and mis-steps especially with various dynamic tasks including head turns. Patient is walking at a good community ambulator speed. She does report increased fatigue. Patient does exhibit increased BP during 6 min walk test which is somewhat concerning although it wasn't too high for safe exercise. Patient tested as a high fall risk with functional gait assessment; She would benefit from skilled PT intervention to improve strength, balance and gait safety; recommend OT referral to address UE weakness and impaired fine motor tasks.     History and Personal Factors relevant to plan of care:  lives with spouse, recent fall, lives in 2 story home with 1 step to  enter, co-morbidities including HTN    Clinical Presentation  Stable    Clinical Presentation due to:  mild weakness and imbalance which does appear to be improving;     Clinical Decision Making  Low    Rehab Potential  Good    Clinical Impairments Affecting Rehab Potential  motivated and good PLOF    PT Frequency  2x / week    PT Duration  4 weeks    PT Treatment/Interventions  Cryotherapy;Electrical Stimulation;Moist Heat;Gait training;Stair training;Functional mobility training;Therapeutic activities;Therapeutic exercise;Balance training;Neuromuscular re-education;Patient/family education;Energy conservation    PT Next Visit Plan  address [place]  will address next visit    Recommended Other Services  Recommend OT for UE hand weakness and impaired fine motor tasks;     Consulted and Agree with Plan of Care  Patient       Patient will benefit from skilled therapeutic intervention in order to improve the following deficits and impairments:  Abnormal gait, Decreased mobility, Impaired perceived functional ability, Decreased strength, Decreased endurance, Decreased activity tolerance, Decreased balance, Difficulty walking  Visit Diagnosis: Unsteadiness on feet  Muscle weakness (generalized)     Problem List Patient Active Problem List   Diagnosis Date Noted  . CVA (cerebral vascular accident) ([place]) 12/08/2018    ,MargaretPT, DPT 12/20/2018, 5:00 PM  [place] MAIN [facility] SERVICES [address] [place], [place], [address] Phone: [phone]   Fax:  [phone]  Name: Shannon [name] MRN: [id number] Date of Birth: Oct 17, 1946

## 2018-12-24 ENCOUNTER — Encounter (HOSPITAL_COMMUNITY): Payer: Self-pay | Admitting: Emergency Medicine

## 2018-12-24 ENCOUNTER — Emergency Department (HOSPITAL_COMMUNITY): Payer: Medicare Other

## 2018-12-24 ENCOUNTER — Other Ambulatory Visit: Payer: Self-pay

## 2018-12-24 ENCOUNTER — Inpatient Hospital Stay (HOSPITAL_COMMUNITY): Payer: Medicare Other

## 2018-12-24 ENCOUNTER — Inpatient Hospital Stay (HOSPITAL_COMMUNITY)
Admission: EM | Admit: 2018-12-24 | Discharge: 2018-12-27 | DRG: 068 | Disposition: A | Discharge status: Home / Self Care | Payer: Medicare Other | Attending: Oncology | Admitting: Oncology

## 2018-12-24 DIAGNOSIS — E860 Dehydration: Secondary | ICD-10-CM | POA: Diagnosis present

## 2018-12-24 DIAGNOSIS — E039 Hypothyroidism, unspecified: Secondary | ICD-10-CM | POA: Diagnosis present

## 2018-12-24 DIAGNOSIS — I63511 Cerebral infarction due to unspecified occlusion or stenosis of right middle cerebral artery: Secondary | ICD-10-CM | POA: Diagnosis not present

## 2018-12-24 DIAGNOSIS — G459 Transient cerebral ischemic attack, unspecified: Secondary | ICD-10-CM | POA: Diagnosis not present

## 2018-12-24 DIAGNOSIS — R29700 NIHSS score 0: Secondary | ICD-10-CM | POA: Diagnosis present

## 2018-12-24 DIAGNOSIS — I639 Cerebral infarction, unspecified: Secondary | ICD-10-CM

## 2018-12-24 DIAGNOSIS — I6603 Occlusion and stenosis of bilateral middle cerebral arteries: Secondary | ICD-10-CM | POA: Diagnosis present

## 2018-12-24 DIAGNOSIS — Z7902 Long term (current) use of antithrombotics/antiplatelets: Secondary | ICD-10-CM | POA: Diagnosis not present

## 2018-12-24 DIAGNOSIS — Z7982 Long term (current) use of aspirin: Secondary | ICD-10-CM

## 2018-12-24 DIAGNOSIS — J101 Influenza due to other identified influenza virus with other respiratory manifestations: Secondary | ICD-10-CM

## 2018-12-24 DIAGNOSIS — I6601 Occlusion and stenosis of right middle cerebral artery: Secondary | ICD-10-CM | POA: Diagnosis not present

## 2018-12-24 DIAGNOSIS — R55 Syncope and collapse: Secondary | ICD-10-CM | POA: Diagnosis present

## 2018-12-24 DIAGNOSIS — I959 Hypotension, unspecified: Secondary | ICD-10-CM | POA: Diagnosis present

## 2018-12-24 DIAGNOSIS — Z881 Allergy status to other antibiotic agents status: Secondary | ICD-10-CM | POA: Diagnosis not present

## 2018-12-24 DIAGNOSIS — G8194 Hemiplegia, unspecified affecting left nondominant side: Secondary | ICD-10-CM | POA: Diagnosis present

## 2018-12-24 DIAGNOSIS — G9389 Other specified disorders of brain: Secondary | ICD-10-CM | POA: Diagnosis not present

## 2018-12-24 DIAGNOSIS — R509 Fever, unspecified: Secondary | ICD-10-CM | POA: Diagnosis not present

## 2018-12-24 DIAGNOSIS — I69354 Hemiplegia and hemiparesis following cerebral infarction affecting left non-dominant side: Secondary | ICD-10-CM | POA: Diagnosis not present

## 2018-12-24 DIAGNOSIS — I6611 Occlusion and stenosis of right anterior cerebral artery: Secondary | ICD-10-CM | POA: Diagnosis present

## 2018-12-24 DIAGNOSIS — R4701 Aphasia: Secondary | ICD-10-CM | POA: Diagnosis present

## 2018-12-24 DIAGNOSIS — I9589 Other hypotension: Secondary | ICD-10-CM | POA: Diagnosis not present

## 2018-12-24 DIAGNOSIS — E785 Hyperlipidemia, unspecified: Secondary | ICD-10-CM | POA: Diagnosis present

## 2018-12-24 DIAGNOSIS — N179 Acute kidney failure, unspecified: Secondary | ICD-10-CM

## 2018-12-24 DIAGNOSIS — Z90710 Acquired absence of both cervix and uterus: Secondary | ICD-10-CM

## 2018-12-24 DIAGNOSIS — Z9862 Peripheral vascular angioplasty status: Secondary | ICD-10-CM | POA: Diagnosis not present

## 2018-12-24 DIAGNOSIS — Z823 Family history of stroke: Secondary | ICD-10-CM | POA: Diagnosis not present

## 2018-12-24 DIAGNOSIS — Z79890 Hormone replacement therapy (postmenopausal): Secondary | ICD-10-CM

## 2018-12-24 DIAGNOSIS — I951 Orthostatic hypotension: Secondary | ICD-10-CM | POA: Diagnosis not present

## 2018-12-24 DIAGNOSIS — I10 Essential (primary) hypertension: Secondary | ICD-10-CM

## 2018-12-24 DIAGNOSIS — Z79899 Other long term (current) drug therapy: Secondary | ICD-10-CM

## 2018-12-24 DIAGNOSIS — I771 Stricture of artery: Secondary | ICD-10-CM

## 2018-12-24 LAB — CBC
HCT: 40.4 % (ref 36.0–46.0)
Hemoglobin: 13.2 g/dL (ref 12.0–15.0)
MCH: 30.3 pg (ref 26.0–34.0)
MCHC: 32.7 g/dL (ref 30.0–36.0)
MCV: 92.9 fL (ref 80.0–100.0)
Platelets: 184 10*3/uL (ref 150–400)
RBC: 4.35 MIL/uL (ref 3.87–5.11)
RDW: 11.2 % — ABNORMAL LOW (ref 11.5–15.5)
WBC: 4.4 10*3/uL (ref 4.0–10.5)
nRBC: 0 % (ref 0.0–0.2)

## 2018-12-24 LAB — I-STAT CHEM 8, ED
BUN: 18 mg/dL (ref 8–23)
Calcium, Ion: 1.08 mmol/L — ABNORMAL LOW (ref 1.15–1.40)
Chloride: 103 mmol/L (ref 98–111)
Creatinine, Ser: 1.1 mg/dL — ABNORMAL HIGH (ref 0.44–1.00)
Glucose, Bld: 148 mg/dL — ABNORMAL HIGH (ref 70–99)
HCT: 40 % (ref 36.0–46.0)
Hemoglobin: 13.6 g/dL (ref 12.0–15.0)
Potassium: 4.7 mmol/L (ref 3.5–5.1)
Sodium: 134 mmol/L — ABNORMAL LOW (ref 135–145)
TCO2: 24 mmol/L (ref 22–32)

## 2018-12-24 LAB — COMPREHENSIVE METABOLIC PANEL
ALT: 21 U/L (ref 0–44)
AST: 26 U/L (ref 15–41)
Albumin: 4.1 g/dL (ref 3.5–5.0)
Alkaline Phosphatase: 63 U/L (ref 38–126)
Anion gap: 9 (ref 5–15)
BUN: 15 mg/dL (ref 8–23)
CO2: 24 mmol/L (ref 22–32)
Calcium: 9.5 mg/dL (ref 8.9–10.3)
Chloride: 103 mmol/L (ref 98–111)
Creatinine, Ser: 1.1 mg/dL — ABNORMAL HIGH (ref 0.44–1.00)
GFR calc Af Amer: 58 mL/min — ABNORMAL LOW (ref >60)
GFR calc non Af Amer: 50 mL/min — ABNORMAL LOW (ref >60)
Glucose, Bld: 152 mg/dL — ABNORMAL HIGH (ref 70–99)
Potassium: 4.6 mmol/L (ref 3.5–5.1)
Sodium: 136 mmol/L (ref 135–145)
Total Bilirubin: 0.9 mg/dL (ref 0.3–1.2)
Total Protein: 7 g/dL (ref 6.5–8.1)

## 2018-12-24 LAB — DIFFERENTIAL
Abs Immature Granulocytes: 0.01 10*3/uL (ref 0.00–0.07)
Basophils Absolute: 0.1 10*3/uL (ref 0.0–0.1)
Basophils Relative: 1 %
Eosinophils Absolute: 0.1 10*3/uL (ref 0.0–0.5)
Eosinophils Relative: 1 %
Immature Granulocytes: 0 %
Lymphocytes Relative: 19 %
Lymphs Abs: 0.8 10*3/uL (ref 0.7–4.0)
Monocytes Absolute: 0.5 10*3/uL (ref 0.1–1.0)
Monocytes Relative: 11 %
Neutro Abs: 3 10*3/uL (ref 1.7–7.7)
Neutrophils Relative %: 68 %

## 2018-12-24 LAB — I-STAT TROPONIN, ED: Troponin i, poc: 0.04 ng/mL (ref 0.00–0.08)

## 2018-12-24 LAB — CBG MONITORING, ED: Glucose-Capillary: 136 mg/dL — ABNORMAL HIGH (ref 70–99)

## 2018-12-24 LAB — PROTIME-INR
INR: 1.07
Prothrombin Time: 13.8 seconds (ref 11.4–15.2)

## 2018-12-24 LAB — APTT: aPTT: 22 seconds — ABNORMAL LOW (ref 24–36)

## 2018-12-24 LAB — I-STAT CG4 LACTIC ACID, ED: Lactic Acid, Venous: 1.11 mmol/L (ref 0.5–1.9)

## 2018-12-24 MED ORDER — ENOXAPARIN SODIUM 40 MG/0.4ML ~~LOC~~ SOLN: 40.0000 mg | SUBCUTANEOUS | Status: DC

## 2018-12-24 MED ORDER — ASPIRIN EC 81 MG PO TBEC: 81.0000 mg | DELAYED_RELEASE_TABLET | Freq: Every day | ORAL | Status: DC

## 2018-12-24 MED ORDER — ACETAMINOPHEN 650 MG RE SUPP: 650.0000 mg | Freq: Four times a day (QID) | RECTAL | Status: DC | PRN

## 2018-12-24 MED ORDER — CLONAZEPAM 0.5 MG PO TABS: 0.5000 mg | ORAL_TABLET | Freq: Every day | ORAL | Status: DC | PRN

## 2018-12-24 MED ORDER — LEVOTHYROXINE SODIUM 50 MCG PO TABS
50.0000 ug | ORAL_TABLET | ORAL | Status: DC
  Administered 2018-12-25 – 2018-12-27 (×2): 50 ug via ORAL
  Filled 2018-12-24 (×2): qty 1

## 2018-12-24 MED ORDER — LACTATED RINGERS IV BOLUS
1000.0000 mL | Freq: Once | INTRAVENOUS | Status: AC
  Administered 2018-12-24: 1000 mL via INTRAVENOUS

## 2018-12-24 MED ORDER — CLOPIDOGREL BISULFATE 75 MG PO TABS
75.0000 mg | ORAL_TABLET | Freq: Every day | ORAL | Status: DC
  Administered 2018-12-26: 75 mg via ORAL
  Filled 2018-12-24: qty 1

## 2018-12-24 MED ORDER — LEVOTHYROXINE SODIUM 75 MCG PO TABS: 75.0000 ug | ORAL_TABLET | ORAL | Status: DC

## 2018-12-24 MED ORDER — BENZONATATE 100 MG PO CAPS
100.0000 mg | ORAL_CAPSULE | Freq: Three times a day (TID) | ORAL | Status: DC | PRN
  Administered 2018-12-24: 100 mg via ORAL
  Filled 2018-12-24: qty 1

## 2018-12-24 MED ORDER — ACETAMINOPHEN 325 MG PO TABS
650.0000 mg | ORAL_TABLET | Freq: Four times a day (QID) | ORAL | Status: DC | PRN
  Administered 2018-12-25 – 2018-12-26 (×3): 650 mg via ORAL
  Filled 2018-12-24 (×3): qty 2

## 2018-12-24 MED ORDER — FAMOTIDINE-CA CARB-MAG HYDROX 10-800-165 MG PO CHEW: 1.0000 | CHEWABLE_TABLET | Freq: Every day | ORAL | Status: DC | PRN

## 2018-12-24 MED ORDER — ATORVASTATIN CALCIUM 40 MG PO TABS
40.0000 mg | ORAL_TABLET | Freq: Every day | ORAL | Status: DC
  Administered 2018-12-24 – 2018-12-26 (×3): 40 mg via ORAL
  Filled 2018-12-24 (×3): qty 1

## 2018-12-24 MED ORDER — CALCIUM CARBONATE ANTACID 500 MG PO CHEW: 1.0000 | CHEWABLE_TABLET | Freq: Every day | ORAL | Status: DC | PRN

## 2018-12-24 MED ORDER — ASPIRIN EC 325 MG PO TBEC
325.0000 mg | DELAYED_RELEASE_TABLET | Freq: Every day | ORAL | Status: DC
  Administered 2018-12-26: 325 mg via ORAL
  Filled 2018-12-24: qty 1

## 2018-12-24 MED ORDER — ONDANSETRON HCL 4 MG/2ML IJ SOLN
4.0000 mg | Freq: Four times a day (QID) | INTRAMUSCULAR | Status: DC | PRN
  Administered 2018-12-26: 4 mg via INTRAVENOUS
  Filled 2018-12-24: qty 2

## 2018-12-24 MED ORDER — ONDANSETRON HCL 4 MG PO TABS: 4.0000 mg | ORAL_TABLET | Freq: Four times a day (QID) | ORAL | Status: DC | PRN

## 2018-12-24 NOTE — Consult Note (Addendum)
Requesting Physician: Dr. Ronnald Nian    Chief Complaint: Passing out, left-sided weakness and gaze deviation  History obtained from: Patient and Chart    HPI:                                                                                                                                       Shannon [name] is an 73 y.o. 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  Last known normal around 6:30 AM when patient went to the kitchen.  Patient stated that she is not feeling well and was sweating.  She checked her blood pressure and it was low.  She subsequently passed and EMS was called by her husband.  On arrival patient was unresponsive, they noted she had right gaze deviation and left side was not moving.  They checked her blood pressure was 64 systolic and they were about to give her fluids however on rechecking it it was 937 systolic. Patient rapidly improved on her way to the hospital, and currently she is back to her baseline with no focal deficits.  Patient was evaluated at [facility] after presenting on December 27 with headaches dizziness vertigo.  Noted an acute infarct in the right basal ganglia.  MRI brain redemonstrated right lentiform acute ischemic infarct and MRI brain showed severe right M1 stenosis as well as moderate left ICA atherosclerotic disease. She was started on dual antiplatelets and discharged has been doing well.  For antihypertensive medications she is on lisinopril 10 mg daily.  She also takes Klonopin at night.  Date last known well: 1.12.20 Time last known well: 6.30 am tPA Given: No symptoms resolved  NIHSS: 0  Baseline MRS 0-1    Past Medical History:  Diagnosis Date  . Hypertension   . Thyroid disease     Past Surgical History:  Procedure Laterality Date  . ABDOMINAL HYSTERECTOMY     . APPENDECTOMY    . CARPAL TUNNEL RELEASE Bilateral   . DILATION AND CURETTAGE OF UTERUS    . OVARIAN CYST REMOVAL      Family History  Problem Relation Age of Onset  . CVA Mother   . Heart disease Mother   . Peripheral Artery Disease Father    Social History:  reports that she has never smoked. She has never used smokeless tobacco. She reports that she does not drink alcohol or use drugs.  Allergies:  Allergies  Allergen Reactions  . Other Palpitations    SINUTAB  . Sulfa Antibiotics Other (See Comments)    Tongue turned black  . Mandol [Cefamandole] Rash    Medications:  I reviewed home medications   ROS:                                                                                                                                     14 systems reviewed and negative except above    Examination:                                                                                                      General: Appears well-developed Psych: Affect appropriate to situation Eyes: No scleral injection HENT: No OP obstrucion Head: Normocephalic.  Cardiovascular: Normal rate and regular rhythm.  Respiratory: Effort normal and breath sounds normal to anterior ascultation GI: Soft.  No distension. There is no tenderness.  Skin: WDI    Neurological Examination Mental Status: Alert, oriented, thought content appropriate.  Speech fluent without evidence of aphasia. Able to follow 3 step commands without difficulty. Cranial Nerves: II: Visual fields grossly normal,  III,IV, VI: ptosis not present, extra-ocular motions intact bilaterally, pupils equal, round, reactive to light and accommodation V,VII: smile symmetric, facial light touch sensation normal bilaterally VIII: hearing normal bilaterally IX,X: uvula rises symmetrically XI: bilateral shoulder shrug XII:  midline tongue extension Motor: Right : Upper extremity   5/5    Left:     Upper extremity   5/5  Lower extremity   5/5     Lower extremity   5/5 Tone and bulk:normal tone throughout; no atrophy noted Sensory: Pinprick and light touch intact throughout, bilaterally Deep Tendon Reflexes: 2+ and symmetric throughout Plantars: Right: downgoing   Left: downgoing Cerebellar: normal finger-to-nose, normal rapid alternating movements and normal heel-to-shin test Gait: normal gait and station     Lab Results: Basic Metabolic Panel: No results for input(s): NA, K, CL, CO2, GLUCOSE, BUN, CREATININE, CALCIUM, MG, PHOS in the last 168 hours.  CBC: No results for input(s): WBC, NEUTROABS, HGB, HCT, MCV, PLT in the last 168 hours.  Coagulation Studies: No results for input(s): LABPROT, INR in the last 72 hours.  Imaging: No results found.   ASSESSMENT AND PLAN  73 year old female with a recent stroke and severe right M1 stenosis presents to the emergency room with syncope and right gaze and left sided weakness in the setting of hypotension.   Cerebral hypoperfusion in the setting of severe right M1 stenosis hypotension Transient ischemic attack  Recommendations Repeat  CT head shows no acute abnormality redemonstrates subacute right basal ganglia infarction Continue aspirin Plavix Hold lisinopril, Recommend keeping blood pressure  diary No need to repeat full stroke work-up, was performed 2 weeks ago Obtain MRI brain to see she if she has extension of her prior stroke  Discussed with Dr. Rosalin Hawking stroke team recommended admission for observation and to assess the patient is a candidate for intracranial stenting.      [facility] Pager Number [phone]

## 2018-12-24 NOTE — ED Notes (Signed)
Pt's CBG result was 136. Informed Anna - RN.

## 2018-12-24 NOTE — Evaluation (Signed)
Physical Therapy Evaluation Patient Details Name: Shannon [name] MRN: [id number] DOB: 1946-04-30 Today's Date: 12/24/2018   History of Present Illness  Pt is a 73 y/o F who presented with increased weakness, dizziness and profuse sweating.  Recently patient was discharge s/p infarct in R basal ganglia and acute lateral lenticulostriate infarct on the R with R M1 segment stenosis, small remote frontal cortex infarct.   Clinical Impression  Patient seen for therapy assessment.  Mobilizing well with no modest focal deficits at this time. Educated patient on BEFAST signs. Will defer all further needs back to outpatient PT. Will sign off.    Follow Up Recommendations Outpatient PT    Equipment Recommendations  None recommended by PT    Recommendations for Other Services       Precautions / Restrictions Precautions Precautions: Fall Restrictions Weight Bearing Restrictions: No      Mobility  Bed Mobility Overal bed mobility: Independent             General bed mobility comments: No physical assist or cues needed.  Pt performs independently.   Transfers Overall transfer level: Needs assistance Equipment used: None Transfers: Sit to/from Stand Sit to Stand: Min guard         General transfer comment: Pt demonstrates mild instability but no LOB  Ambulation/Gait Ambulation/Gait assistance: Supervision Gait Distance (Feet): 210 Feet Assistive device: None Gait Pattern/deviations: Decreased step length - right;Decreased step length - left Gait velocity: decreased   General Gait Details: Steady with ambulation despite decreased cadence  Stairs            Wheelchair Mobility    Modified Rankin (Stroke Patients Only) Modified Rankin (Stroke Patients Only) Pre-Morbid Rankin Score: No symptoms Modified Rankin: No significant disability     Balance Overall balance assessment: Needs assistance Sitting-balance support: No upper extremity supported;Feet  supported Sitting balance-Leahy Scale: Good     Standing balance support: No upper extremity supported;During functional activity Standing balance-Leahy Scale: Fair                   Standardized Balance Assessment Standardized Balance Assessment : Dynamic Gait Index   Dynamic Gait Index Level Surface: Mild Impairment Change in Gait Speed: Normal Gait with Horizontal Head Turns: Mild Impairment Gait with Vertical Head Turns: Mild Impairment Gait and Pivot Turn: Mild Impairment Step Over Obstacle: Mild Impairment Step Around Obstacles: Mild Impairment Steps: Mild Impairment Total Score: 17       Pertinent Vitals/Pain Pain Assessment: No/denies pain    Home Living Family/patient expects to be discharged to:: Private residence Living Arrangements: Spouse/significant other Available Help at Discharge: Family;Available 24 hours/day Type of Home: House Home Access: Stairs to enter Entrance Stairs-Rails: None Entrance Stairs-Number of Steps: 1 Home Layout: Two level;Able to live on main level with bedroom/bathroom Home Equipment: Shower seat - built in      Prior Function Level of Independence: Independent         Comments: Ambulating without AD.  1 fall in the past 6 months (1 day PTA).  Ind with ADLs, still driving.      Hand Dominance   Dominant Hand: Right    Extremity/Trunk Assessment        Lower Extremity Assessment Lower Extremity Assessment: Overall WFL for tasks assessed       Communication   Communication: No difficulties  Cognition Arousal/Alertness: Awake/alert Behavior During Therapy: WFL for tasks assessed/performed Overall Cognitive Status: Within Functional Limits for tasks assessed  General Comments      Exercises     Assessment/Plan    PT Assessment All further PT needs can be met in the next venue of care  PT Problem List Decreased balance       PT Treatment  Interventions      PT Goals (Current goals can be found in the Care Plan section)  Acute Rehab PT Goals Patient Stated Goal: to return home PT Goal Formulation: All assessment and education complete, DC therapy Potential to Achieve Goals: Good    Frequency     Barriers to discharge        Co-evaluation               AM-PAC PT "6 Clicks" Mobility  Outcome Measure Help needed turning from your back to your side while in a flat bed without using bedrails?: None Help needed moving from lying on your back to sitting on the side of a flat bed without using bedrails?: None Help needed moving to and from a bed to a chair (including a wheelchair)?: A Little Help needed standing up from a chair using your arms (e.g., wheelchair or bedside chair)?: A Little Help needed to walk in hospital room?: A Little Help needed climbing 3-5 steps with a railing? : A Little 6 Click Score: 20    End of Session Equipment Utilized During Treatment: Gait belt Activity Tolerance: Patient tolerated treatment well;No increased pain Patient left: in bed;with call bell/phone within reach;with bed alarm set;with family/visitor present Nurse Communication: Mobility status;Other (comment)(no chair alarm box in room) PT Visit Diagnosis: Unsteadiness on feet (R26.81)    Time: 3926-5997 PT Time Calculation (min) (ACUTE ONLY): 19 min   Charges:   PT Evaluation $PT Eval Low Complexity: [place], PT DPT  Board Certified Neurologic Specialist Acute Rehabilitation Services Pager [phone] Office [phone]   Duncan Dull 12/24/2018, 3:50 PM

## 2018-12-24 NOTE — Progress Notes (Signed)
Patient arrived to 3W from ED. VSS, TELE applied and confirmed. Safety precaution and orders reviewed with patient/family. Pt denied any distress. Will continue to monitor.    Ave Filter, RN

## 2018-12-24 NOTE — Plan of Care (Signed)
Note reviewed. I have discussed with Dr. Lorraine Lax and IR Dr. Estanislado Pandy. Will plan for cerebral angiogram in am for evaluation of right M1 high grade stenosis. Will keep NPO after midnight. Order placed. Will follow.   Rosalin Hawking, MD PhD Stroke Neurology 12/24/2018 4:21 PM

## 2018-12-24 NOTE — H&P (Signed)
Date: 12/24/2018               Patient Name:  Shannon [name] MRN: [id number]  DOB: [birth year] Age / Sex: 73 y.o., female   PCP: Donalynn Furlong, MD         Medical Service: Internal Medicine Teaching Service         Attending Physician: Dr. Lucious Groves, DO    First Contact: Dr. Laural Golden Pager: [phone]  Second Contact: Dr. Tarri Abernethy Pager: [phone]       After Hours (After 5p/  First Contact Pager: [phone]  weekends / holidays): Second Contact Pager: [phone]   Chief Complaint: Code stroke  History of Present Illness: [name] is a 73 y.o female with thyroid disease, HTN and a recent stroke in 12/19 with severe right M1 stenosis who presented to the ED as code stroke. She was in her usual state of health until this morning around 6:30 am when she noticed she wasn't feeling well, dizzy and sweating profusely. She stated she had a cough that kept her up all night and she felt her symptoms were related to that.  She checked her BP and found it to be in the 110's, which she said is low for her. She states her BP is normally in the 140's. She went to feed her dog and dropped a glass of water that she was holding in her left hand.  Shortly after she sustained a fall which her husband heard from upstairs.  When he came down he saw his wife sitting against a wall and a chair had fallen over.  He states she was unresponsive so he called EMS.  When the EMS arrived they checked her blood pressure which at first was in the 27X systolic but repeat was in the 100s.  She improved on her way to the hospital.  She recently presented to [facility] on 12/08/18 with an acute infarct in the right basal ganglia.  MRI brain demonstrated right lentiform acute ischemic infarct and MRI brain showed severe right M1 stenosis as well as moderate left ICA atherosclerotic disease. She was started on dual antiplatelets and discharged.  She states she has some residual left-sided weakness since her stroke.  She has been  working with physical therapy and overall doing well.  She noticed on Friday that she was having difficulty typing with her left hand.  Her daughter also noticed during her last PT session her diastolic pressure dropped more than 20 while her systolic pressure rose by 10.  She states she is able to do all her ADLs without assistance.  She has been taking all her medications as prescribed.  Denies any chest pain, headaches, shortness of breath, vision changes, dizziness, dysuria or abdominal pain.  She has noticed "fluorescent colored yellow-green" urine.  ED course: On arrival to the ED, BP was 112/54, heart rate 102. Cr 1.10, baseline 0.7. Chest xray showed no acute cardiopulmonary disease. CT head showed no acute intracranial abnormality, recent infarct involving posterior right lentiform nucleus and centrum semi ovale is again noted. There is no evidence for expansion of this infarct. She was admitted for observation and possible intracranial stenting.    Meds:  Lisinopril 10 mg- HTN Lipitor  Plavix and aspirin for recent CVA  Current Meds  Medication Sig  . Alum Hydroxide-Mag Carbonate (GAVISCON PO) Take 5 mLs by mouth daily as needed.  Marland Kitchen aspirin EC 81 MG EC tablet Take 1 tablet (81 mg total) by mouth  daily.  . atorvastatin (LIPITOR) 40 MG tablet Take 1 tablet (40 mg total) by mouth daily at 6 PM.  . calcium carbonate (OS-CAL) 600 MG tablet Take 1 tablet by mouth daily.  . clonazePAM (KLONOPIN) 0.5 MG tablet Take 1 tablet by mouth daily as needed.  . clopidogrel (PLAVIX) 75 MG tablet Take 1 tablet (75 mg total) by mouth daily.  Marland Kitchen co-enzyme Q-10 30 MG capsule Take 30 mg by mouth daily.  . famotidine-calcium carbonate-magnesium hydroxide (PEPCID COMPLETE) 10-800-165 MG chewable tablet Chew 1 tablet by mouth daily as needed.  Marland Kitchen levothyroxine (SYNTHROID, LEVOTHROID) 50 MCG tablet Take 1 tablet by mouth daily.  Marland Kitchen levothyroxine (SYNTHROID, LEVOTHROID) 75 MCG tablet Take 1 tablet by mouth daily.    Marland Kitchen lisinopril (PRINIVIL,ZESTRIL) 10 MG tablet Take 10 mg by mouth daily.   . Multiple Vitamin (MULTIVITAMIN) capsule Take 1 capsule by mouth daily.  . [DISCONTINUED] calcium citrate-vitamin D (CITRACAL+D) 315-200 MG-UNIT tablet Take 1 tablet by mouth daily.     Allergies: Allergies as of 12/24/2018 - Review Complete 12/24/2018  Allergen Reaction Noted  . Other Palpitations 12/08/2018  . Sulfa antibiotics Other (See Comments) 10/12/2017  . Mandol [cefamandole] Rash 12/08/2018   Past Medical History:  Diagnosis Date  . Hypertension   . Thyroid disease     Family History:   2 sisters with breast cancer Brothers with cardiac disease  Family History  Problem Relation Age of Onset  . CVA Mother   . Heart disease Mother   . Peripheral Artery Disease Father     Social History:  Denies any tobacco, alcohol or illicit drug use. She lives at home with her husband of 64 years. She is able to perform all her ADL's. She has some residual left sided weakness since her stroke in 12/19.   Review of Systems: A complete ROS was negative except as per HPI.   Physical Exam: Blood pressure (!) 107/47, pulse 94, temperature 98.1 F (36.7 C), temperature source Oral, resp. rate 16, weight 53 kg, SpO2 100 %.  Physical Exam  Constitutional: She is oriented to person, place, and time and well-developed, well-nourished, and in no distress.  Eyes: Conjunctivae and EOM are normal.  Cardiovascular: Normal rate, regular rhythm and normal heart sounds.  No murmur heard. Pulmonary/Chest: Breath sounds normal. No respiratory distress. She has no wheezes. She has no rales.  Musculoskeletal:        General: No edema.  Neurological: She is alert and oriented to person, place, and time. No cranial nerve deficit. Coordination normal.  UE strength 5/5, LE strength 5/5, sensation intact bilaterally  Skin: Skin is warm and dry.  Psychiatric: Mood, memory, affect and judgment normal.    EKG: personally  reviewed my interpretation is NSR  CXR: personally reviewed my interpretation is no acute cardiopulmonary disease.   Assessment & Plan by Problem: Active Problems:   CVA (cerebral vascular accident) [facility])  Ms. Makyra Corprew is a 73 y.o female with thyroid disease, HTN and a recent stroke in 12/19 with severe right M1 stenosis who presented to the ED as code stroke. She was in her usual state of health until this morning around 6:30 am when she noticed she wasn't feeling well and dizzy. Her husband checked her blood pressure and found it to be very low so he called the EMS. On arrival to the ED, she was found to have a very low blood pressure and AKI.   TIA Recent CVA Right M1 Stenosis Patient had a  recent stroke 12/27 and presents with syncope, right gaze deviation and left sided weakness in the setting of hypotension. MRI at that time showed acute infarct of the right basal ganglia. She was discharged on dual antiplatelets. Repeat CT head shows no acute abnormality, re-demonstrates subacute right basal ganglia infarction. Per neuro, will obtain MRI brain to see if there is extension of prior stroke. Will also consult IR for possible right M1 stenting.  - follow up MRI brain - permissive hypertension - continue atorvastatin, aspirin and plavix - stroke swallow screen - cardiac monitoring - PT OT eval and treat  - consult IR for possible right M1 stenting  HTN - lisinopril 10 mg  - hold for permissive hypertension  Thyroid disease - continue levothyroxine 50 mcg MWF and 75 mcg T/Th/S/Su  Diet: NPO DVT prophylaxis: Lovenox Full Code   Dispo: Admit patient to Inpatient with expected length of stay greater than 2 midnights.  SignedMike Craze, DO 12/24/2018, 10:10 AM  Pager: [phone]

## 2018-12-24 NOTE — ED Triage Notes (Addendum)
Pt in from home via [facility] as code stroke, LSN 2030 per EMS. Hx of recent CVA, takes Plavix. Per pt, woke up normal and got very sweaty while walking to bathroom. Had syncopal episode at 0645, fell to floor. C-collar on. R gaze, L side flaccidness present on EMS arrival. Symptoms resolved by time of arrival to ED. Initial BP for EMS 60/40

## 2018-12-24 NOTE — ED Provider Notes (Signed)
[name] Provider Note   CSN: [id number] Arrival date & time: 12/24/18  0630     History   Chief Complaint Chief Complaint  Patient presents with  . Cerebrovascular Accident  . Loss of Consciousness    HPI Shannon [name] is a 73 y.o. female.  Per EMS patient had right-sided gaze, left-sided weakness, aphasia.  Had possible syncopal event prior to them arriving this morning.  Family member called EMS after patient fell to the floor.  Patient does not have any specific complaints upon arrival.  Denies any chest pain, shortness of breath.  Patient is able to follow commands and talk. Patient did have hypotension originally with EMS that resolved with small IV fluid bolus  The history is provided by the patient and the EMS personnel.  Neurologic Problem  This is a new problem. Episode onset: Last known normal last night at 830 pm per EMS (12 hours ago) The problem occurs rarely. The problem has been gradually improving. Pertinent negatives include no chest pain, no abdominal pain, no headaches and no shortness of breath. Nothing aggravates the symptoms. Nothing relieves the symptoms. She has tried nothing for the symptoms. The treatment provided no relief.    Past Medical History:  Diagnosis Date  . Hypertension   . Thyroid disease     Patient Active Problem List   Diagnosis Date Noted  . CVA (cerebral vascular accident) ([place]) 12/08/2018    Past Surgical History:  Procedure Laterality Date  . ABDOMINAL HYSTERECTOMY    . APPENDECTOMY    . CARPAL TUNNEL RELEASE Bilateral   . DILATION AND CURETTAGE OF UTERUS    . OVARIAN CYST REMOVAL       OB History   No obstetric history on file.      Home Medications    Prior to Admission medications   Medication Sig Start Date End Date Taking? Authorizing Provider  alendronate (FOSAMAX) 10 MG tablet Take 1 tablet by mouth daily.    [provider]  alendronate (FOSAMAX) 70 MG  tablet Take 1 tablet by mouth once a week.    [provider]  Alum Hydroxide-Mag Carbonate (GAVISCON PO) Take 5 mLs by mouth daily as needed.    [provider]  aspirin EC 81 MG EC tablet Take 1 tablet (81 mg total) by mouth daily. 12/10/18   Mayo, Pete Pelt, MD  atorvastatin (LIPITOR) 40 MG tablet Take 1 tablet (40 mg total) by mouth daily at 6 PM. 12/09/18   Mayo, Pete Pelt, MD  calcium carbonate (OS-CAL) 600 MG tablet Take 1 tablet by mouth daily.    [provider]  calcium citrate-vitamin D (CITRACAL+D) 315-200 MG-UNIT tablet Take 1 tablet by mouth daily. 04/24/12   [provider]  clonazePAM (KLONOPIN) 0.5 MG tablet Take 1 tablet by mouth daily as needed.    [provider]  clopidogrel (PLAVIX) 75 MG tablet Take 1 tablet (75 mg total) by mouth daily. 12/10/18   Mayo, Pete Pelt, MD  famotidine-calcium carbonate-magnesium hydroxide (PEPCID COMPLETE) 10-800-165 MG chewable tablet Chew 1 tablet by mouth daily as needed.    [provider]  levothyroxine (SYNTHROID, LEVOTHROID) 50 MCG tablet Take 1 tablet by mouth daily. 04/24/12   [provider]  levothyroxine (SYNTHROID, LEVOTHROID) 75 MCG tablet Take 1 tablet by mouth daily. 04/24/12   [provider]  lisinopril (PRINIVIL,ZESTRIL) 10 MG tablet Take 1 tablet by mouth daily.    [provider]  Multiple Vitamin (  MULTIVITAMIN) capsule Take 1 capsule by mouth daily. 04/24/12   [provider]  ranitidine (ZANTAC) 150 MG tablet Take 1 tablet by mouth daily.    [provider]  vitamin E 400 UNIT capsule Take 1 capsule by mouth daily.    [provider]    Family History Family History  Problem Relation Age of Onset  . CVA Mother   . Heart disease Mother   . Peripheral Artery Disease Father     Social History Social History   Tobacco Use  . Smoking status: Never Smoker  . Smokeless tobacco: Never Used  Substance Use Topics  .  Alcohol use: Never    Frequency: Never  . Drug use: Never     Allergies   Other; Sulfa antibiotics; and Mandol [cefamandole]   Review of Systems Review of Systems  Constitutional: Negative for chills and fever.  HENT: Negative for ear pain and sore throat.   Eyes: Negative for pain and visual disturbance.  Respiratory: Negative for cough and shortness of breath.   Cardiovascular: Negative for chest pain and palpitations.  Gastrointestinal: Negative for abdominal pain and vomiting.  Genitourinary: Negative for dysuria and hematuria.  Musculoskeletal: Negative for arthralgias and back pain.  Skin: Negative for color change and rash.  Neurological: Positive for weakness and light-headedness. Negative for seizures, syncope and headaches.  All other systems reviewed and are negative.    Physical Exam Updated Vital Signs  ED Triage Vitals  Enc Vitals Group     BP 12/24/18 0809 (!) 112/54     Pulse Rate 12/24/18 0809 (!) 102     Resp --      Temp 12/24/18 0811 98.1 F (36.7 C)     Temp Source 12/24/18 0811 Oral     SpO2 12/24/18 0809 100 %     Weight 12/24/18 0816 116 lb 13.5 oz (53 kg)     Height --      Head Circumference --      Peak Flow --      Pain Score 12/24/18 0815 0     Pain Loc --      Pain Edu? --      Excl. in [place]? --     Physical Exam Vitals signs and nursing note reviewed.  Constitutional:      General: She is not in acute distress.    Appearance: She is well-developed. She is not ill-appearing.  HENT:     Head: Normocephalic and atraumatic.     Nose: Nose normal.     Mouth/Throat:     Mouth: Mucous membranes are moist.  Eyes:     Extraocular Movements: Extraocular movements intact.     Conjunctiva/sclera: Conjunctivae normal.     Pupils: Pupils are equal, round, and reactive to light.     Comments: No visual field deficit  Neck:     Musculoskeletal: Neck supple.  Cardiovascular:     Rate and Rhythm: Normal rate and regular rhythm.     Pulses:  Normal pulses.     Heart sounds: Normal heart sounds. No murmur.  Pulmonary:     Effort: Pulmonary effort is normal. No respiratory distress.     Breath sounds: Normal breath sounds.  Abdominal:     General: There is no distension.     Palpations: Abdomen is soft.     Tenderness: There is no abdominal tenderness.  Skin:    General: Skin is warm and dry.     Capillary Refill: Capillary  refill takes less than 2 seconds.  Neurological:     General: No focal deficit present.     Mental Status: She is alert.     Comments: Patient with 5+ out of 5 strength throughout, normal sensation, no obvious gaze preference, no drift, normal finger-to-nose finger  Psychiatric:        Mood and Affect: Mood normal.      ED Treatments / Results  Labs (all labs ordered are listed, but only abnormal results are displayed) Labs Reviewed  APTT - Abnormal; Notable for the following components:      Result Value   aPTT 22 (*)    All other components within normal limits  CBC - Abnormal; Notable for the following components:   RDW 11.2 (*)    All other components within normal limits  CBG MONITORING, ED - Abnormal; Notable for the following components:   Glucose-Capillary 136 (*)    All other components within normal limits  I-STAT CHEM 8, ED - Abnormal; Notable for the following components:   Sodium 134 (*)    Creatinine, Ser 1.10 (*)    Glucose, Bld 148 (*)    Calcium, Ion 1.08 (*)    All other components within normal limits  URINE CULTURE  PROTIME-INR  DIFFERENTIAL  COMPREHENSIVE METABOLIC PANEL  URINALYSIS, ROUTINE W REFLEX MICROSCOPIC  I-STAT TROPONIN, ED  I-STAT CG4 LACTIC ACID, ED    EKG EKG Interpretation  Date/Time:  Sunday December 24 2018 08:09:40 EST Ventricular Rate:  94 PR Interval:    QRS Duration: 99 QT Interval:  348 QTC Calculation: 436 R Axis:   50 Text Interpretation:  Normal sinus rhythm Confirmed by Lennice Sites [phone]) on 12/24/2018 8:14:49  AM   Radiology Ct Cervical Spine Wo Contrast  Result Date: 12/24/2018 CLINICAL DATA:  Episode of left-sided weakness and right-sided gaze. Fall. EXAM: CT CERVICAL SPINE WITHOUT CONTRAST TECHNIQUE: Multidetector CT imaging of the cervical spine was performed without intravenous contrast. Multiplanar CT image reconstructions were also generated. COMPARISON:  None. FINDINGS: Alignment: AP alignment is anatomic. There straightening of the normal cervical lordosis. The patient is an a hard collar. Skull base and vertebrae: Craniocervical junction is normal. Vertebral body heights are maintained. No acute fracture is present. Soft tissues and spinal canal: Thyroid is markedly atrophic or surgically absent. No adenopathy is present. Salivary glands are within normal limits. Vascular calcifications are noted at the left carotid bifurcation, at the origin of the right vertebral artery, and at the innominate artery. Disc levels: Uncovertebral disease results in moderate right foraminal narrowing at C5-6 and mild bilateral foraminal narrowing at C6-7. Upper chest: The lung apices are clear. Thoracic inlet is within normal limits. IMPRESSION: 1. No acute trauma. 2. Atherosclerosis. 3. Degenerative changes of the cervical spine are most evident at C5-6 on the right and bilaterally at C6-7. 4. Straightening of the normal cervical lordosis. This may be related to positioning as the patient is an a hard collar. Electronically Signed   By: San Morelle M.D.   On: 12/24/2018 08:14   Dg Chest Portable 1 View  Result Date: 12/24/2018 CLINICAL DATA:  Possible stroke. EXAM: PORTABLE CHEST 1 VIEW COMPARISON:  None. FINDINGS: Monitoring leads overlie the patient. Normal cardiac and mediastinal contours. Calcified granuloma right lower lung. No pleural effusion or pneumothorax. Thoracic spine degenerative changes. IMPRESSION: No acute cardiopulmonary process. Electronically Signed   By: Lovey Newcomer M.D.   On: 12/24/2018  08:19   Ct Head Code Stroke Wo Contrast  Result Date: 12/24/2018 CLINICAL DATA:  Code stroke. New onset of left-sided weakness and right-sided gaze. Fall. EXAM: CT HEAD WITHOUT CONTRAST TECHNIQUE: Contiguous axial images were obtained from the base of the skull through the vertex without intravenous contrast. COMPARISON:  MRI brain 12/08/2018. FINDINGS: Brain: The subacute infarct involving the posterior right lentiform nucleus and centrum semi ovale is again noted. No new infarct is present. A remote lacunar infarct involving the left basal ganglia is stable. Periventricular Brechtel matter disease is stable. Basal ganglia are otherwise intact. Internal capsule is otherwise normal. No focal cortical abnormalities are present. The ventricles are of normal size. No significant extraaxial fluid collection is present. The brainstem and cerebellum are within normal limits. Vascular: Atherosclerotic calcifications are present within the cavernous internal carotid arteries. There is no hyperdense vessel. Skull: Calvarium is intact. No focal lytic or blastic lesions are present. Sinuses/Orbits: The right maxillary sinus is shrunken. There is no active disease. Paranasal sinuses are otherwise clear. Mastoid air cells are clear bilaterally. Globes and orbits are within normal limits. ASPECTS [facility] Stroke Program Early CT Score) - Ganglionic level infarction (caudate, lentiform nuclei, internal capsule, insula, M1-M3 cortex): 7/7 - Supraganglionic infarction (M4-M6 cortex): 3/3 Total score (0-10 with 10 being normal): 10/10 IMPRESSION: 1. No acute intracranial abnormality. 2. Recent infarct involving the posterior right lentiform nucleus and centrum semi ovale is again noted. There is no evidence for expansion of this infarct. 3. Atherosclerosis without hyperdense vessel. 4. ASPECTS is 10/10 The above was relayed via text pager to Dr. Lorraine Lax on 12/24/2018 at 08:02 . Electronically Signed   By: San Morelle M.D.    On: 12/24/2018 08:09    Procedures .Critical Care Performed by: Lennice Sites, DO Authorized by: Lennice Sites, DO   Critical care provider statement:    Critical care time (minutes):  35   Critical care time was exclusive of:  Separately billable procedures and treating other patients and teaching time   Critical care was necessary to treat or prevent imminent or life-threatening deterioration of the following conditions:  CNS failure or compromise   Critical care was time spent personally by me on the following activities:  Development of treatment plan with patient or surrogate, discussions with primary provider, discussions with consultants, evaluation of patient's response to treatment, obtaining history from patient or surrogate, ordering and performing treatments and interventions, ordering and review of laboratory studies, re-evaluation of patient's condition, ordering and review of radiographic studies and review of old charts   I assumed direction of critical care for this patient from another provider in my specialty: no     (including critical care time)  Medications Ordered in ED Medications  lactated ringers bolus 1,000 mL (has no administration in time range)     Initial Impression / Assessment and Plan / ED Course  I have reviewed the triage vital signs and the nursing notes.  Pertinent labs & imaging results that were available during my care of the patient were reviewed by me and considered in my medical decision making (see chart for details).     Shannon [name] is a 73 year old female with history of recent stroke, hypertension, carotid artery stenosis who presents to the ED after syncopal event for concern for stroke.  Patient was a code stroke upon arrival.  Patient with unremarkable vitals upon arrival.  Supposedly last known normal was 830 last night however when patient woke up this morning she felt fairly normal got sweaty and sat down on a chair.  She took her  blood pressure and thought that it was low.  When she stood up she fell back to the floor.  When EMS arrived patient had blood pressure of 60/40.  She had left-sided weakness with right-sided gaze.  Upon arrival to the ED symptoms have mostly resolved.  Blood pressure has improved.  Neurology at the bedside upon arrival.  Patient was taken directly to the CT scan which showed no acute findings.  Showed recent infarct involving the posterior right lentiform nuclear this involving the right M1.  No hemorrhagic conversion.  CT of the neck was unremarkable.  Patient with improved hemodynamics after fluid bolus with EMS.  Will give additional fluid bolus. Patient is not a candidate for tPA or IR intervention acutely.  Patient with mild AKI.  Otherwise no significant electrolyte abnormality, leukocytosis.  Troponin within normal limits.  EKG shows sinus rhythm.  No signs of ischemic changes.  Patient does not have chest pain.  Lactic acid within normal limits.  Has had cough but no sputum production.  Chest x-ray does not show any pneumonia, pneumothorax, pleural effusion.  Overall work-up is unremarkable.  Dr. Lorraine Lax with neurology states that he talked with stroke team and states that patient should be admitted for repeat MRI and may be a candidate for interventional stenting of the M1.  Suspect symptoms were likely secondary to orthostatic versus dehydration causing reactivation due to low blood pressure.  Hemodynamics have improved following IV fluids.  Patient was not a candidate for TPA or other acute intervention given history and physical.  Patient was admitted to medicine service in stable condition.  Urinalysis pending at time of admission.  This chart was dictated using voice recognition software.  Despite best efforts to proofread,  errors can occur which can change the documentation meaning.   Final Clinical Impressions(s) / ED Diagnoses   Final diagnoses:  Syncope and collapse  Dehydration  AKI  (acute kidney injury) [facility])    ED Discharge Orders    None       Lennice Sites, DO 12/24/18 [phone]

## 2018-12-24 NOTE — Code Documentation (Signed)
73 year old female presents to [facility] via [place] as code stroke.  Arrived to ED at 0742 - on arrival sx had resolved.  EMS reports she was LSW at 0630 when she got up to go to the kitchen - she was not feeling well - checked her BP which was low and  Then she had a syncopal episode - her husband heard her fall and found her unresponsive.  EMS was called - initially was noted to have right gaze deviation and left side flaccid - BP was low - treated and came up - on way to hospital sx resolved.  Presents to the ED without focal deficit.  Met at bridge by ED staff and stroke team.  To CT scan - this RN had to leave for floor emergency - handoff to [facility] and Dr. Lorraine Lax.

## 2018-12-25 ENCOUNTER — Inpatient Hospital Stay (HOSPITAL_COMMUNITY): Payer: Medicare Other

## 2018-12-25 ENCOUNTER — Ambulatory Visit: Payer: Medicare Other

## 2018-12-25 DIAGNOSIS — E039 Hypothyroidism, unspecified: Secondary | ICD-10-CM

## 2018-12-25 DIAGNOSIS — I6601 Occlusion and stenosis of right middle cerebral artery: Secondary | ICD-10-CM

## 2018-12-25 DIAGNOSIS — Z79890 Hormone replacement therapy (postmenopausal): Secondary | ICD-10-CM

## 2018-12-25 DIAGNOSIS — R55 Syncope and collapse: Secondary | ICD-10-CM

## 2018-12-25 DIAGNOSIS — I63511 Cerebral infarction due to unspecified occlusion or stenosis of right middle cerebral artery: Secondary | ICD-10-CM

## 2018-12-25 DIAGNOSIS — G459 Transient cerebral ischemic attack, unspecified: Secondary | ICD-10-CM

## 2018-12-25 DIAGNOSIS — Z7902 Long term (current) use of antithrombotics/antiplatelets: Secondary | ICD-10-CM

## 2018-12-25 DIAGNOSIS — I951 Orthostatic hypotension: Secondary | ICD-10-CM

## 2018-12-25 DIAGNOSIS — N179 Acute kidney failure, unspecified: Secondary | ICD-10-CM

## 2018-12-25 DIAGNOSIS — E860 Dehydration: Secondary | ICD-10-CM

## 2018-12-25 DIAGNOSIS — Z881 Allergy status to other antibiotic agents status: Secondary | ICD-10-CM

## 2018-12-25 DIAGNOSIS — I69354 Hemiplegia and hemiparesis following cerebral infarction affecting left non-dominant side: Secondary | ICD-10-CM

## 2018-12-25 DIAGNOSIS — Z79899 Other long term (current) drug therapy: Secondary | ICD-10-CM

## 2018-12-25 DIAGNOSIS — I10 Essential (primary) hypertension: Secondary | ICD-10-CM

## 2018-12-25 DIAGNOSIS — Z7982 Long term (current) use of aspirin: Secondary | ICD-10-CM

## 2018-12-25 HISTORY — PX: IR ANGIO VERTEBRAL SEL SUBCLAVIAN INNOMINATE UNI L MOD SED: IMG5364

## 2018-12-25 HISTORY — PX: IR US GUIDE VASC ACCESS RIGHT: IMG2390

## 2018-12-25 HISTORY — PX: IR ANGIO VERTEBRAL SEL VERTEBRAL UNI R MOD SED: IMG5368

## 2018-12-25 HISTORY — PX: IR ANGIO INTRA EXTRACRAN SEL COM CAROTID INNOMINATE BILAT MOD SED: IMG5360

## 2018-12-25 LAB — BASIC METABOLIC PANEL
Anion gap: 9 (ref 5–15)
BUN: 14 mg/dL (ref 8–23)
CO2: 25 mmol/L (ref 22–32)
Calcium: 9 mg/dL (ref 8.9–10.3)
Chloride: 103 mmol/L (ref 98–111)
Creatinine, Ser: 0.98 mg/dL (ref 0.44–1.00)
GFR calc Af Amer: >60 mL/min (ref >60)
GFR calc non Af Amer: 58 mL/min — ABNORMAL LOW (ref >60)
Glucose, Bld: 96 mg/dL (ref 70–99)
Potassium: 3.9 mmol/L (ref 3.5–5.1)
Sodium: 137 mmol/L (ref 135–145)

## 2018-12-25 LAB — PLATELET INHIBITION P2Y12: Platelet Function  P2Y12: 174 [PRU] — ABNORMAL LOW (ref 194–418)

## 2018-12-25 LAB — CBC
HCT: 37.7 % (ref 36.0–46.0)
Hemoglobin: 12.8 g/dL (ref 12.0–15.0)
MCH: 31.1 pg (ref 26.0–34.0)
MCHC: 34 g/dL (ref 30.0–36.0)
MCV: 91.7 fL (ref 80.0–100.0)
Platelets: 178 10*3/uL (ref 150–400)
RBC: 4.11 MIL/uL (ref 3.87–5.11)
RDW: 11.4 % — ABNORMAL LOW (ref 11.5–15.5)
WBC: 3.5 10*3/uL — ABNORMAL LOW (ref 4.0–10.5)
nRBC: 0 % (ref 0.0–0.2)

## 2018-12-25 MED ORDER — IOHEXOL 300 MG/ML  SOLN
150.0000 mL | Freq: Once | INTRAMUSCULAR | Status: AC | PRN
  Administered 2018-12-25: 70 mL via INTRA_ARTERIAL

## 2018-12-25 MED ORDER — ENOXAPARIN SODIUM 40 MG/0.4ML ~~LOC~~ SOLN: 40.0000 mg | SUBCUTANEOUS | Status: DC

## 2018-12-25 MED ORDER — NITROGLYCERIN 1 MG/10 ML FOR IR/CATH LAB
INTRA_ARTERIAL | Status: AC | PRN
  Administered 2018-12-25 (×2): 200 ug via INTRA_ARTERIAL

## 2018-12-25 MED ORDER — LIDOCAINE HCL 1 % IJ SOLN
INTRAMUSCULAR | Status: AC
  Filled 2018-12-25: qty 20

## 2018-12-25 MED ORDER — HEPARIN SODIUM (PORCINE) 1000 UNIT/ML IJ SOLN
INTRAMUSCULAR | Status: AC
  Filled 2018-12-25: qty 1

## 2018-12-25 MED ORDER — VERAPAMIL HCL 2.5 MG/ML IV SOLN
INTRAVENOUS | Status: AC
  Filled 2018-12-25: qty 2

## 2018-12-25 MED ORDER — FENTANYL CITRATE (PF) 100 MCG/2ML IJ SOLN
INTRAMUSCULAR | Status: AC | PRN
  Administered 2018-12-25: 25 ug via INTRAVENOUS

## 2018-12-25 MED ORDER — ENOXAPARIN SODIUM 40 MG/0.4ML ~~LOC~~ SOLN
40.0000 mg | SUBCUTANEOUS | Status: DC
  Administered 2018-12-26 – 2018-12-27 (×2): 40 mg via SUBCUTANEOUS
  Filled 2018-12-25 (×2): qty 0.4

## 2018-12-25 MED ORDER — SODIUM CHLORIDE 0.9 % IV SOLN
INTRAVENOUS | Status: AC
  Administered 2018-12-25: 16:00:00 via INTRAVENOUS

## 2018-12-25 MED ORDER — FENTANYL CITRATE (PF) 100 MCG/2ML IJ SOLN
INTRAMUSCULAR | Status: AC
  Filled 2018-12-25: qty 2

## 2018-12-25 MED ORDER — VERAPAMIL HCL 2.5 MG/ML IV SOLN
INTRAVENOUS | Status: AC | PRN
  Administered 2018-12-25: 2.5 mg via INTRA_ARTERIAL

## 2018-12-25 MED ORDER — HEPARIN SODIUM (PORCINE) 1000 UNIT/ML IJ SOLN
INTRAMUSCULAR | Status: AC | PRN
  Administered 2018-12-25: 2000 [IU]

## 2018-12-25 MED ORDER — MIDAZOLAM HCL 2 MG/2ML IJ SOLN
INTRAMUSCULAR | Status: AC
  Filled 2018-12-25: qty 2

## 2018-12-25 MED ORDER — MIDAZOLAM HCL 2 MG/2ML IJ SOLN
INTRAMUSCULAR | Status: AC | PRN
  Administered 2018-12-25: 1 mg via INTRAVENOUS

## 2018-12-25 MED ORDER — LIDOCAINE HCL (PF) 1 % IJ SOLN
INTRAMUSCULAR | Status: AC | PRN
  Administered 2018-12-25: 5 mL

## 2018-12-25 MED ORDER — NITROGLYCERIN 1 MG/10 ML FOR IR/CATH LAB
INTRA_ARTERIAL | Status: AC
  Filled 2018-12-25: qty 10

## 2018-12-25 NOTE — Progress Notes (Signed)
   Subjective: No overnight events. She denies any further episodes of dizziness or sweating. She continues to have her baseline left-sided weakness that causes balance issues while ambulating. The plan for angiogram and possible stenting was discussed with the patient. All questions were answered.  Objective:  Vital signs in last 24 hours: Vitals:   12/24/18 2004 12/25/18 0007 12/25/18 0409 12/25/18 0817  BP: (!) 142/55 (!) 129/55 (!) 115/51 (!) 110/93  Pulse: 88 88 82 93  Resp: 18 18 18    Temp: 98.9 F (37.2 C) 99.6 F (37.6 C) 99.1 F (37.3 C)   TempSrc: Oral Oral Oral   SpO2: 94% 98% 100% 100%  Weight:       Gen: laying comfortably in bed, no distress Neuro: cranial nerves II-XII intact, no dysmetria on cerebellar testing, 5/5 strength throughout, normoreflexic Pulm: CTAB Ext: no edema  Assessment/Plan:  Ms. Larri Brewton is a 73 y.o female with thyroid disease, HTN and a recent stroke in 12/19 with severe right M1 stenosis who presented to the ED after being found unresponsive and hypotensive. With IVF her blood pressure improved and her symptoms resolved. Neurology was consulted and she was subsequently admitted for further evaluation and management of her severe right M1 stenosis.   TIA Recent CVA Right M1 Stenosis Patient had a recent stroke 12/27 and presents with syncope, right gaze deviation and left sided weakness in the setting of hypotension. MRI this admission without acute CVA or propagation of prior CVA.  - Permissive hypertension - Continue atorvastatin, aspirin and plavix - Cardiac monitoring - PT recommends outpatient PT - OT eval and treat  - Appreciate neurology recs  - Appreciate IR recs, Cerebral arteriogram today with possible intervention   HTN - Holding home Lisinopril  - Allowing permissive HTN  Thyroid disease - Continue levothyroxine 50 mcg MWF and 75 mcg T/Th/S/Su  Dispo: Anticipated discharge in approximately 2-3 day(s) pending further  work-up.  Ina Homes, MD 12/25/2018, 10:21 AM

## 2018-12-25 NOTE — Plan of Care (Signed)
  Problem: Education: Goal: Knowledge of General Education information will improve Description Including pain rating scale, medication(s)/side effects and non-pharmacologic comfort measures Outcome: Progressing   Problem: Health Behavior/Discharge Planning: Goal: Ability to manage health-related needs will improve Outcome: Progressing   

## 2018-12-25 NOTE — Progress Notes (Signed)
STROKE TEAM PROGRESS NOTE   SUBJECTIVE (INTERVAL HISTORY) Her husband and daughter are at the bedside.  Overall her condition is stable.  Neuro intact.  Had also stated vitals this morning showed no orthostatic hypotension.  Had some angiogram today showed right M1 high-grade stenosis 90%.  Plan for right MCA stent ASAP and TEE/loop recorder to rule out A. fib.   OBJECTIVE Temp:  [98.9 F (37.2 C)-99.6 F (37.6 C)] 99.1 F (37.3 C) (01/13 0409) Pulse Rate:  [82-113] 94 (01/13 1245) Cardiac Rhythm: Sinus tachycardia (01/13 1245) Resp:  [16-27] 23 (01/13 1245) BP: (110-154)/(51-93) 129/75 (01/13 1245) SpO2:  [94 %-100 %] 94 % (01/13 1245)  Recent Labs  Lab 12/24/18 0745  GLUCAP 136*   Recent Labs  Lab 12/24/18 0750 12/24/18 0757 12/25/18 0650  NA 136 134* 137  K 4.6 4.7 3.9  CL 103 103 103  CO2 24  --  25  GLUCOSE 152* 148* 96  BUN 15 18 14   CREATININE 1.10* 1.10* 0.98  CALCIUM 9.5  --  9.0   Recent Labs  Lab 12/24/18 0750  AST 26  ALT 21  ALKPHOS 63  BILITOT 0.9  PROT 7.0  ALBUMIN 4.1   Recent Labs  Lab 12/24/18 0750 12/24/18 0757 12/25/18 0650  WBC 4.4  --  3.5*  NEUTROABS 3.0  --   --   HGB 13.2 13.6 12.8  HCT 40.4 40.0 37.7  MCV 92.9  --  91.7  PLT 184  --  178   No results for input(s): CKTOTAL, CKMB, CKMBINDEX, TROPONINI in the last 168 hours. Recent Labs    12/24/18 0750  LABPROT 13.8  INR 1.07   No results for input(s): COLORURINE, LABSPEC, PHURINE, GLUCOSEU, HGBUR, BILIRUBINUR, KETONESUR, PROTEINUR, UROBILINOGEN, NITRITE, LEUKOCYTESUR in the last 72 hours.  Invalid input(s): APPERANCEUR     Component Value Date/Time   CHOL 199 12/09/2018 0352   TRIG 110 12/09/2018 0352   HDL 45 12/09/2018 0352   CHOLHDL 4.4 12/09/2018 0352   VLDL 22 12/09/2018 0352   LDLCALC 132 (H) 12/09/2018 0352   Lab Results  Component Value Date   HGBA1C 5.4 12/09/2018   No results found for: LABOPIA, COCAINSCRNUR, LABBENZ, AMPHETMU, THCU, LABBARB  No  results for input(s): ETH in the last 168 hours.  I have personally reviewed the radiological images below and agree with the radiology interpretations.  Ct Head Wo Contrast  Result Date: 12/08/2018 CLINICAL DATA:  Right-sided headache for 6 days. Fatigue. Right-sided mouth weakness EXAM: CT HEAD WITHOUT CONTRAST TECHNIQUE: Contiguous axial images were obtained from the base of the skull through the vertex without intravenous contrast. COMPARISON:  Head CT and brain MRI October 12, 2017 FINDINGS: Brain: The ventricles are normal in size and configuration. There is no intracranial mass, hemorrhage, extra-axial fluid collection, or midline shift. There is decreased attenuation involving much of the right lentiform nucleus as well as involving portions of the right internal and external capsule, concerning for acute right basal ganglia infarct. There is evidence of a prior infarct involving the anterior limb of the left external capsule, stable. Brain parenchyma otherwise appears unremarkable. Vascular: No hyperdense vessel. There is calcification in each carotid siphon region. Skull: The bony calvarium appears intact. Sinuses/Orbits: There is mild mucosal thickening in several ethmoid air cells. Other visualized paranasal sinuses are clear. Visualized orbits appear symmetric bilaterally. Other: Mastoid air cells are clear. IMPRESSION: Apparent acute infarct involving much of the right basal ganglia. Localized cytotoxic edema in this area noted.  Prior infarct anterior limb left external capsule. No mass or hemorrhage. There are foci of arterial vascular calcification. There is mild mucosal thickening in several ethmoid air cells. Electronically Signed   By: Lowella Grip III M.D.   On: 12/08/2018 09:42   Ct Cervical Spine Wo Contrast  Result Date: 12/24/2018 CLINICAL DATA:  Episode of left-sided weakness and right-sided gaze. Fall. EXAM: CT CERVICAL SPINE WITHOUT CONTRAST TECHNIQUE: Multidetector CT  imaging of the cervical spine was performed without intravenous contrast. Multiplanar CT image reconstructions were also generated. COMPARISON:  None. FINDINGS: Alignment: AP alignment is anatomic. There straightening of the normal cervical lordosis. The patient is an a hard collar. Skull base and vertebrae: Craniocervical junction is normal. Vertebral body heights are maintained. No acute fracture is present. Soft tissues and spinal canal: Thyroid is markedly atrophic or surgically absent. No adenopathy is present. Salivary glands are within normal limits. Vascular calcifications are noted at the left carotid bifurcation, at the origin of the right vertebral artery, and at the innominate artery. Disc levels: Uncovertebral disease results in moderate right foraminal narrowing at C5-6 and mild bilateral foraminal narrowing at C6-7. Upper chest: The lung apices are clear. Thoracic inlet is within normal limits. IMPRESSION: 1. No acute trauma. 2. Atherosclerosis. 3. Degenerative changes of the cervical spine are most evident at C5-6 on the right and bilaterally at C6-7. 4. Straightening of the normal cervical lordosis. This may be related to positioning as the patient is an a hard collar. Electronically Signed   By: San Morelle M.D.   On: 12/24/2018 08:14   Mr Brain Wo Contrast  Result Date: 12/24/2018 CLINICAL DATA:  Focal neuro deficit. New onset left-sided weakness and right-sided gaze. Fall. Recent right basal ganglia infarct. EXAM: MRI HEAD WITHOUT CONTRAST TECHNIQUE: Multiplanar, multiecho pulse sequences of the brain and surrounding structures were obtained without intravenous contrast. COMPARISON:  CT head without contrast 12/24/2018. MRI brain 12/08/2018. FINDINGS: Brain: The superior aspect of the previous infarct demonstrates some restricted diffusion signal. There is no significant restricted diffusion on the ADC map. No new areas of infarction are evident. T2 signal changes are associated with  the recent infarct. Periventricular [place] matter changes are otherwise stable. The remote infarct of the left caudate head and lentiform nucleus is again seen. A remote lacunar infarct is again noted in the left thalamus. No hemorrhage or mass lesion is present. The ventricles are of normal size. No significant extraaxial fluid collection is present. The internal auditory canals are within normal limits. The brainstem and cerebellum are within normal limits. Vascular: Flow is present in the major intracranial arteries. Skull and upper cervical spine: The craniocervical junction is normal. Upper cervical spine is within normal limits. Marrow signal is unremarkable. Sinuses/Orbits: The right maxillary sinus is shrunken without active disease. Minimal mucosal thickening is present in the ethmoid air cells. Mastoid air cells are clear. The globes and orbits are within normal limits. IMPRESSION: 1. Evolving previous infarct with some residual diffusion signal changes in the right centrum semi ovale. There is no significant restricted diffusion. 2. No new infarcts. 3. Stable remote infarcts involving the left caudate, left lentiform nucleus, and left thalamus. 4. 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 Electronically Signed   By: San Morelle M.D.   On: 12/24/2018 14:22   Mr Brain Wo Contrast  Result Date: 12/08/2018 CLINICAL DATA:  Stroke follow-up EXAM: MRI HEAD WITHOUT CONTRAST MRA HEAD WITHOUT CONTRAST TECHNIQUE: Multiplanar, multiecho pulse sequences of the brain  and surrounding structures were obtained without intravenous contrast. Angiographic images of the head were obtained using MRA technique without contrast. COMPARISON:  10/12/2017 brain MRI FINDINGS: MRI HEAD FINDINGS Brain: Wedge of restricted diffusion extending from the upper right putamen across the corona radiata into the right caudate body. Remote left lenticulostriate infarct that is more anteriorly  positioned. Small remote high and posterior right frontal cortex infarct. No acute hemorrhage, hydrocephalus, collection, or masslike finding. Vascular: Arterial findings below. Skull and upper cervical spine: Negative for marrow lesion Sinuses/Orbits: Negative MRA HEAD FINDINGS The carotid arteries are symmetric and diffusely patent. There is a moderate stenosis at the supraclinoid ICA on the left. Advanced distal right M1 segment stenosis which likely also continues into a proximal M2 branch. There is less full MCA branches on the right. The vertebral and basilar arteries are smooth and widely patent. Symmetric robust flow in posterior cerebral arteries. Negative for aneurysm IMPRESSION: 1. 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 2. Remote left basal ganglia infarct. 3. Small remote right frontal cortex infarct. 4. Moderate left supraclinoid ICA narrowing. Electronically Signed   By: Monte Fantasia M.D.   On: 12/08/2018 14:11   US Carotid Bilateral (at [facility] Only)  Result Date: 12/09/2018 CLINICAL DATA:  Acute CVA. EXAM: BILATERAL CAROTID DUPLEX ULTRASOUND TECHNIQUE: Pearline Cables scale imaging, color Doppler and duplex ultrasound were performed of bilateral carotid and vertebral arteries in the neck. COMPARISON:  Brain MRI 12/08/2018 FINDINGS: Criteria: Quantification of carotid stenosis is based on velocity parameters that correlate the residual internal carotid diameter with NASCET-based stenosis levels, using the diameter of the distal internal carotid lumen as the denominator for stenosis measurement. The following velocity measurements were obtained: RIGHT ICA: 110 cm/sec CCA: 81 cm/sec SYSTOLIC ICA/CCA RATIO:  1.4 ECA: 185 cm/sec LEFT ICA: 134 cm/sec CCA: 623 cm/sec SYSTOLIC ICA/CCA RATIO:  1.3 ECA: 156 cm/sec RIGHT CAROTID ARTERY: Minimal plaque at the right carotid bulb and origin of the external carotid artery. External carotid artery is patent  with normal waveform. Normal waveforms and velocities in the internal carotid artery. RIGHT VERTEBRAL ARTERY: Antegrade flow and normal waveform in the right vertebral artery. LEFT CAROTID ARTERY: Small amount of plaque in the distal common carotid artery and bulb. External carotid artery is patent with normal waveform. Slightly elevated velocity in the distal internal carotid artery is probably related to tortuosity. No significant plaque or stenosis in the left internal carotid artery. LEFT VERTEBRAL ARTERY: Antegrade flow and normal waveform in the left vertebral artery. IMPRESSION: Minimal plaque in the carotid arteries. Estimated degree of stenosis in the internal carotid arteries is less than 50% bilaterally. Patent vertebral arteries with antegrade flow. Electronically Signed   By: Markus Daft M.D.   On: 12/09/2018 08:09   Dg Chest Portable 1 View  Result Date: 12/24/2018 CLINICAL DATA:  Possible stroke. EXAM: PORTABLE CHEST 1 VIEW COMPARISON:  None. FINDINGS: Monitoring leads overlie the patient. Normal cardiac and mediastinal contours. Calcified granuloma right lower lung. No pleural effusion or pneumothorax. Thoracic spine degenerative changes. IMPRESSION: No acute cardiopulmonary process. Electronically Signed   By: Lovey Newcomer M.D.   On: 12/24/2018 08:19   Mr Jodene Nam Head/brain JS Cm  Result Date: 12/08/2018 CLINICAL DATA:  Stroke follow-up EXAM: MRI HEAD WITHOUT CONTRAST MRA HEAD WITHOUT CONTRAST TECHNIQUE: Multiplanar, multiecho pulse sequences of the brain and surrounding structures were obtained without intravenous contrast. Angiographic images of the head were obtained using MRA technique without contrast. COMPARISON:  10/12/2017 brain MRI FINDINGS: MRI HEAD FINDINGS Brain: Wedge of restricted diffusion extending from the upper right putamen across the corona radiata into the right caudate body. Remote left lenticulostriate infarct that is more anteriorly positioned. Small remote high and  posterior right frontal cortex infarct. No acute hemorrhage, hydrocephalus, collection, or masslike finding. Vascular: Arterial findings below. Skull and upper cervical spine: Negative for marrow lesion Sinuses/Orbits: Negative MRA HEAD FINDINGS The carotid arteries are symmetric and diffusely patent. There is a moderate stenosis at the supraclinoid ICA on the left. Advanced distal right M1 segment stenosis which likely also continues into a proximal M2 branch. There is less full MCA branches on the right. The vertebral and basilar arteries are smooth and widely patent. Symmetric robust flow in posterior cerebral arteries. Negative for aneurysm IMPRESSION: 1. 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 2. Remote left basal ganglia infarct. 3. Small remote right frontal cortex infarct. 4. Moderate left supraclinoid ICA narrowing. Electronically Signed   By: Monte Fantasia M.D.   On: 12/08/2018 14:11   Ct Head Code Stroke Wo Contrast  Result Date: 12/24/2018 CLINICAL DATA:  Code stroke. New onset of left-sided weakness and right-sided gaze. Fall. EXAM: CT HEAD WITHOUT CONTRAST TECHNIQUE: Contiguous axial images were obtained from the base of the skull through the vertex without intravenous contrast. COMPARISON:  MRI brain 12/08/2018. FINDINGS: Brain: The subacute infarct involving the posterior right lentiform nucleus and centrum semi ovale is again noted. No new infarct is present. A remote lacunar infarct involving the left basal ganglia is stable. Periventricular Topel matter disease is stable. Basal ganglia are otherwise intact. Internal capsule is otherwise normal. No focal cortical abnormalities are present. The ventricles are of normal size. No significant extraaxial fluid collection is present. The brainstem and cerebellum are within normal limits. Vascular: Atherosclerotic calcifications are present within the cavernous internal carotid arteries. There  is no hyperdense vessel. Skull: Calvarium is intact. No focal lytic or blastic lesions are present. Sinuses/Orbits: The right maxillary sinus is shrunken. There is no active disease. Paranasal sinuses are otherwise clear. Mastoid air cells are clear bilaterally. Globes and orbits are within normal limits. ASPECTS [facility] Stroke Program Early CT Score) - Ganglionic level infarction (caudate, lentiform nuclei, internal capsule, insula, M1-M3 cortex): 7/7 - Supraganglionic infarction (M4-M6 cortex): 3/3 Total score (0-10 with 10 being normal): 10/10 IMPRESSION: 1. No acute intracranial abnormality. 2. Recent infarct involving the posterior right lentiform nucleus and centrum semi ovale is again noted. There is no evidence for expansion of this infarct. 3. Atherosclerosis without hyperdense vessel. 4. ASPECTS is 10/10 The above was relayed via text pager to Dr. Lorraine Lax on 12/24/2018 at 08:02 . Electronically Signed   By: San Morelle M.D.   On: 12/24/2018 08:09   DSA -  1.Severe 90 % stenosis of RT MCA M 1 seg  2.50 % stenosis of LT MCA M 1. 3 .Severe stenosis of 80 tom 90 % of LT VA origin   PHYSICAL EXAM  Temp:  [98.9 F (37.2 C)-99.6 F (37.6 C)] 99.1 F (37.3 C) (01/13 0409) Pulse Rate:  [82-113] 94 (01/13 1245) Resp:  [16-27] 23 (01/13 1245) BP: (110-154)/(51-93) 129/75 (01/13 1245) SpO2:  [94 %-100 %] 94 % (01/13 1245)  General - Well nourished, well developed, in no apparent distress.  Ophthalmologic - fundi not visualized due to noncooperation.  Cardiovascular - Regular rate and rhythm, tachycardia due to fever.  Mental Status -  Level of arousal and  orientation to time, place, and person were intact. Language including expression, naming, repetition, comprehension was assessed and found intact. Fund of Knowledge was assessed and was intact.  Cranial Nerves II - XII - II - Visual field intact OU. III, IV, VI - Extraocular movements intact. V - Facial sensation intact  bilaterally. VII - Facial movement intact bilaterally. VIII - Hearing & vestibular intact bilaterally. X - Palate elevates symmetrically. XI - Chin turning & shoulder shrug intact bilaterally. XII - Tongue protrusion intact.  Motor Strength - The patient's strength was normal in all extremities and pronator drift was absent.  Bulk was normal and fasciculations were absent.   Motor Tone - Muscle tone was assessed at the neck and appendages and was normal.  Reflexes - The patient's reflexes were symmetrical in all extremities and she had no pathological reflexes.  Sensory - Light touch, temperature/pinprick were assessed and were symmetrical.    Coordination - The patient had normal movements in the hands with no ataxia or dysmetria.  Tremor was absent.  Gait and Station - deferred.   ASSESSMENT/PLAN [name] is a 73 y.o. female with history of HTN and recent stroke admitted for left hemiplegia and right gaze with severe hypotension. No tPA given due to symptoms resolved.    TIA - due to right MCA high grade stenosis in the setting of hypotension  MRI  Subacute right BG/CR infarct, no acute finding  DSA - right M1 90% stenosis  Pt has high risk of recurrent right MCA infarct given right M1 high grade stenosis, will recommend right MCA angioplasty vs. Stenting with Dr Estanislado Pandy  P2 Y 12 pending  lovenox for VTE prophylaxis  aspirin 81 mg daily and clopidogrel 75 mg daily prior to admission, now on aspirin 325 mg daily and clopidogrel 75 mg daily. Continue DAPT with ASA 325 and plavix 75 for 3 months and then plavix alone given intracranial stenosis.  Patient counseled to be compliant with her antithrombotic medications  Ongoing aggressive stroke risk factor management  Therapy recommendations:  outpt PT/OT   Disposition:  pending  Recent stroke   12/08/18 -admitted for slurred speech and generalized weakness, dizziness.  CT showed right basal ganglia infarct  MRI  showed right BG/CR infarct  MRI showed right M1 high-grade stenosis  Carotid Doppler unremarkable  2D Echo EF 60 to 65%  LDL 132  HgbA1c 5.4  Put on ASA 81 and plavix 75 on discharge  Pt no significant stroke risk factors, with isolated right M1 high grade stenosis, concerning for cardioembolic source, recommend TEE and loop recorder.   Hypotension  On EMS arrival, BP 60s  Symptoms resolved after BP normalization  Orthostatic vitals pending   D/c home BP meds - lisinopril   BP goal 130-150 due to right M1 high grade stenosis  Hyperlipidemia  Home meds:  lipitor 40   LDL 132, goal < 70  Now on lipitor 40  Continue statin at discharge  Fever with cough  Fever with T-max 102.2  Coughing since Friday  Tylenol as needed  CXR yesterday no acute process  On IV fluid  Other Stroke Risk Factors  Advanced age  Other Active Problems  Leukopenia WBC 3.5  Hospital day # 1  I spent  35 minutes in total face-to-face time with the patient, more than 50% of which was spent in counseling and coordination of care, reviewing test results, images and medication, and discussing the diagnosis of right MCA stroke, high-grade right M1 stenosis,  hypotension fever, treatment plan and potential prognosis. This patient's care requiresreview of multiple databases, neurological assessment, discussion with family, other specialists and medical decision making of high complexity.  I also discussed with Dr. Randal Buba, MD PhD Stroke Neurology 12/25/2018 2:01 PM    To contact Stroke Continuity provider, please refer to [email or web address] After hours, contact General Neurology

## 2018-12-25 NOTE — Procedures (Signed)
S/P 4 vessel cerebral arteriogram  RT CFA approach Findings. 1.Severe 90 % stenosis of RT MCA M 1 seg  2.50 % stenosis of LT MCA M 1. 3 .Severe stenosis of 80 tom 90 % of LT VA origin

## 2018-12-25 NOTE — Progress Notes (Addendum)
    [facility] has been requested to perform a transesophageal echocardiogram on Shannon [name] for Stroke.  After careful review of history and examination, the risks and benefits of transesophageal echocardiogram have been explained including risks of esophageal damage, perforation (1:10,000 risk), bleeding, pharyngeal hematoma as well as other potential complications associated with conscious sedation including aspiration, arrhythmia, respiratory failure and death. Alternatives to treatment were discussed, questions were answered. Patient is willing to proceed.   The procedure is scheduled for 12/26/2018 at 3:00 with Dr. Margaretann Loveless.   Daune Perch, NP  12/25/2018 4:42 PM

## 2018-12-25 NOTE — Progress Notes (Signed)
Completed decompression of TR Band on pt's Right Radial Arm. 2x2 Gauze dressing with plastic tape applied. Patient's temperature appears to be improving with Tylenol. Will continue to monitor.

## 2018-12-25 NOTE — Consult Note (Signed)
Chief Complaint: Patient was seen in consultation today for cerebral arteriogram Chief Complaint  Patient presents with  . Cerebrovascular Accident  . Loss of Consciousness   at the request of Dr Lavera Guise  Supervising Physician: Luanne Bras  Patient Status: [facility] - In-pt  History of Present Illness: Shannon [name] is a 73 y.o. 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  Noted an acute infarct in the right basal ganglia.  MRI brain redemonstrated right lentiform acute ischemic infarct and MRI brain showed severe right M1 stenosis as well as moderate left ICA atherosclerotic disease. She was started on dual antiplatelets and discharged has been doing well.  For antihypertensive medications she is on lisinopril 10 mg daily.  She also takes Klonopin at night.  MR Brain yesterday: IMPRESSION: 1. Evolving previous infarct with some residual diffusion signal changes in the right centrum semi ovale. There is no significant restricted diffusion. 2. No new infarcts. 3. Stable remote infarcts involving the left caudate, left lentiform nucleus, and left thalamus. 4. 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  Request now for cerebral arteriogram Diagnostic study today per Dr Estanislado Pandy  Past Medical History:  Diagnosis Date  . Hypertension   . Thyroid disease     Past Surgical History:  Procedure Laterality Date  . ABDOMINAL HYSTERECTOMY    . APPENDECTOMY    . CARPAL TUNNEL RELEASE Bilateral   . DILATION AND CURETTAGE OF UTERUS    . OVARIAN CYST REMOVAL      Allergies: Other; Sulfa antibiotics; and Mandol [cefamandole]  Medications: Prior to Admission medications   Medication Sig Start Date End Date Taking? Authorizing Provider  Alum  Hydroxide-Mag Carbonate (GAVISCON PO) Take 5 mLs by mouth daily as needed.   Yes [provider]  aspirin EC 81 MG EC tablet Take 1 tablet (81 mg total) by mouth daily. 12/10/18  Yes Mayo, Pete Pelt, MD  atorvastatin (LIPITOR) 40 MG tablet Take 1 tablet (40 mg total) by mouth daily at 6 PM. 12/09/18  Yes Mayo, Pete Pelt, MD  calcium carbonate (OS-CAL) 600 MG tablet Take 1 tablet by mouth daily.   Yes [provider]  clonazePAM (KLONOPIN) 0.5 MG tablet Take 1 tablet by mouth daily as needed.   Yes [provider]  clopidogrel (PLAVIX) 75 MG tablet Take 1 tablet (75 mg total) by mouth daily. 12/10/18  Yes Mayo, Pete Pelt, MD  co-enzyme Q-10 30 MG capsule Take 30 mg by mouth daily.   Yes [provider]  famotidine-calcium carbonate-magnesium hydroxide (PEPCID COMPLETE) 10-800-165 MG chewable tablet Chew 1 tablet by mouth daily as needed.   Yes [provider]  levothyroxine (SYNTHROID, LEVOTHROID) 50 MCG tablet Take 1 tablet by mouth daily. 04/24/12  Yes [provider]  levothyroxine (SYNTHROID, LEVOTHROID) 75 MCG tablet Take 1 tablet by mouth daily. 04/24/12  Yes [provider]  lisinopril (PRINIVIL,ZESTRIL) 10 MG tablet Take 10 mg by mouth daily.    Yes [provider]  Multiple Vitamin (MULTIVITAMIN) capsule Take 1 capsule by mouth daily. 04/24/12  Yes [provider]  vitamin E 400 UNIT capsule Take 400 Units by mouth every other day.     [provider]     Family History  Problem Relation Age of Onset  .  CVA Mother   . Heart disease Mother   . Peripheral Artery Disease Father     Social History   Socioeconomic History  . Marital status: Married    Spouse name: Not on file  . Number of children: Not on file  . Years of education: Not on file  . Highest education level: Not on file  Occupational History  . Occupation: retired  Scientific laboratory technician  . Financial resource strain: Not on file  . Food  insecurity:    Worry: Not on file    Inability: Not on file  . Transportation needs:    Medical: Not on file    Non-medical: Not on file  Tobacco Use  . Smoking status: Never Smoker  . Smokeless tobacco: Never Used  Substance and Sexual Activity  . Alcohol use: Never    Frequency: Never  . Drug use: Never  . Sexual activity: Yes  Lifestyle  . Physical activity:    Days per week: Not on file    Minutes per session: Not on file  . Stress: Not on file  Relationships  . Social connections:    Talks on phone: Not on file    Gets together: Not on file    Attends religious service: Not on file    Active member of club or organization: Not on file    Attends meetings of clubs or organizations: Not on file    Relationship status: Not on file  Other Topics Concern  . Not on file  Social History Narrative  . Not on file    Review of Systems: A 12 point ROS discussed and pertinent positives are indicated in the HPI above.  All other systems are negative.  Review of Systems  Constitutional: Positive for activity change and fatigue. Negative for fever.  HENT: Negative for tinnitus and trouble swallowing.   Eyes: Negative for visual disturbance.  Respiratory: Negative for cough and shortness of breath.   Cardiovascular: Negative for chest pain.  Gastrointestinal: Negative for abdominal pain.  Musculoskeletal: Negative for back pain and gait problem.  Neurological: Positive for weakness. Negative for dizziness, tremors, seizures, syncope, facial asymmetry, speech difficulty, light-headedness, numbness and headaches.  Psychiatric/Behavioral: Negative for behavioral problems, confusion and decreased concentration.    Vital Signs: BP (!) 110/93 (BP Location: Left Arm)   Pulse 93   Temp 99.1 F (37.3 C) (Oral)   Resp 18   Wt 116 lb 13.5 oz (53 kg)   SpO2 100%   BMI 19.44 kg/m   Physical Exam Vitals signs reviewed.  Constitutional:      Appearance: Normal appearance.  HENT:       Head: Atraumatic.  Cardiovascular:     Rate and Rhythm: Normal rate and regular rhythm.  Pulmonary:     Effort: Pulmonary effort is normal.     Breath sounds: Normal breath sounds.  Abdominal:     General: Bowel sounds are normal.     Palpations: Abdomen is soft.  Musculoskeletal: Normal range of motion.  Skin:    General: Skin is warm and dry.  Neurological:     General: No focal deficit present.     Mental Status: She is oriented to person, place, and time.  Psychiatric:        Mood and Affect: Mood normal.        Behavior: Behavior normal.        Thought Content: Thought content normal.        Judgment: Judgment normal.  Imaging: Ct Head Wo Contrast  Result Date: 12/08/2018 CLINICAL DATA:  Right-sided headache for 6 days. Fatigue. Right-sided mouth weakness EXAM: CT HEAD WITHOUT CONTRAST TECHNIQUE: Contiguous axial images were obtained from the base of the skull through the vertex without intravenous contrast. COMPARISON:  Head CT and brain MRI October 12, 2017 FINDINGS: Brain: The ventricles are normal in size and configuration. There is no intracranial mass, hemorrhage, extra-axial fluid collection, or midline shift. There is decreased attenuation involving much of the right lentiform nucleus as well as involving portions of the right internal and external capsule, concerning for acute right basal ganglia infarct. There is evidence of a prior infarct involving the anterior limb of the left external capsule, stable. Brain parenchyma otherwise appears unremarkable. Vascular: No hyperdense vessel. There is calcification in each carotid siphon region. Skull: The bony calvarium appears intact. Sinuses/Orbits: There is mild mucosal thickening in several ethmoid air cells. Other visualized paranasal sinuses are clear. Visualized orbits appear symmetric bilaterally. Other: Mastoid air cells are clear. IMPRESSION: Apparent acute infarct involving much of the right basal ganglia.  Localized cytotoxic edema in this area noted. Prior infarct anterior limb left external capsule. No mass or hemorrhage. There are foci of arterial vascular calcification. There is mild mucosal thickening in several ethmoid air cells. Electronically Signed   By: Lowella Grip III M.D.   On: 12/08/2018 09:42   Ct Cervical Spine Wo Contrast  Result Date: 12/24/2018 CLINICAL DATA:  Episode of left-sided weakness and right-sided gaze. Fall. EXAM: CT CERVICAL SPINE WITHOUT CONTRAST TECHNIQUE: Multidetector CT imaging of the cervical spine was performed without intravenous contrast. Multiplanar CT image reconstructions were also generated. COMPARISON:  None. FINDINGS: Alignment: AP alignment is anatomic. There straightening of the normal cervical lordosis. The patient is an a hard collar. Skull base and vertebrae: Craniocervical junction is normal. Vertebral body heights are maintained. No acute fracture is present. Soft tissues and spinal canal: Thyroid is markedly atrophic or surgically absent. No adenopathy is present. Salivary glands are within normal limits. Vascular calcifications are noted at the left carotid bifurcation, at the origin of the right vertebral artery, and at the innominate artery. Disc levels: Uncovertebral disease results in moderate right foraminal narrowing at C5-6 and mild bilateral foraminal narrowing at C6-7. Upper chest: The lung apices are clear. Thoracic inlet is within normal limits. IMPRESSION: 1. No acute trauma. 2. Atherosclerosis. 3. Degenerative changes of the cervical spine are most evident at C5-6 on the right and bilaterally at C6-7. 4. Straightening of the normal cervical lordosis. This may be related to positioning as the patient is an a hard collar. Electronically Signed   By: San Morelle M.D.   On: 12/24/2018 08:14   Mr Brain Wo Contrast  Result Date: 12/24/2018 CLINICAL DATA:  Focal neuro deficit. New onset left-sided weakness and right-sided gaze. Fall.  Recent right basal ganglia infarct. EXAM: MRI HEAD WITHOUT CONTRAST TECHNIQUE: Multiplanar, multiecho pulse sequences of the brain and surrounding structures were obtained without intravenous contrast. COMPARISON:  CT head without contrast 12/24/2018. MRI brain 12/08/2018. FINDINGS: Brain: The superior aspect of the previous infarct demonstrates some restricted diffusion signal. There is no significant restricted diffusion on the ADC map. No new areas of infarction are evident. T2 signal changes are associated with the recent infarct. Periventricular Cloe matter changes are otherwise stable. The remote infarct of the left caudate head and lentiform nucleus is again seen. A remote lacunar infarct is again noted in the left thalamus. No hemorrhage or mass lesion  is present. The ventricles are of normal size. No significant extraaxial fluid collection is present. The internal auditory canals are within normal limits. The brainstem and cerebellum are within normal limits. Vascular: Flow is present in the major intracranial arteries. Skull and upper cervical spine: The craniocervical junction is normal. Upper cervical spine is within normal limits. Marrow signal is unremarkable. Sinuses/Orbits: The right maxillary sinus is shrunken without active disease. Minimal mucosal thickening is present in the ethmoid air cells. Mastoid air cells are clear. The globes and orbits are within normal limits. IMPRESSION: 1. Evolving previous infarct with some residual diffusion signal changes in the right centrum semi ovale. There is no significant restricted diffusion. 2. No new infarcts. 3. Stable remote infarcts involving the left caudate, left lentiform nucleus, and left thalamus. 4. 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 Electronically Signed   By: San Morelle M.D.   On: 12/24/2018 14:22   Mr Brain Wo Contrast  Result Date: 12/08/2018 CLINICAL DATA:  Stroke follow-up EXAM: MRI HEAD  WITHOUT CONTRAST MRA HEAD WITHOUT CONTRAST TECHNIQUE: Multiplanar, multiecho pulse sequences of the brain and surrounding structures were obtained without intravenous contrast. Angiographic images of the head were obtained using MRA technique without contrast. COMPARISON:  10/12/2017 brain MRI FINDINGS: MRI HEAD FINDINGS Brain: Wedge of restricted diffusion extending from the upper right putamen across the corona radiata into the right caudate body. Remote left lenticulostriate infarct that is more anteriorly positioned. Small remote high and posterior right frontal cortex infarct. No acute hemorrhage, hydrocephalus, collection, or masslike finding. Vascular: Arterial findings below. Skull and upper cervical spine: Negative for marrow lesion Sinuses/Orbits: Negative MRA HEAD FINDINGS The carotid arteries are symmetric and diffusely patent. There is a moderate stenosis at the supraclinoid ICA on the left. Advanced distal right M1 segment stenosis which likely also continues into a proximal M2 branch. There is less full MCA branches on the right. The vertebral and basilar arteries are smooth and widely patent. Symmetric robust flow in posterior cerebral arteries. Negative for aneurysm IMPRESSION: 1. 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 2. Remote left basal ganglia infarct. 3. Small remote right frontal cortex infarct. 4. Moderate left supraclinoid ICA narrowing. Electronically Signed   By: Monte Fantasia M.D.   On: 12/08/2018 14:11   US Carotid Bilateral (at [facility] And [facility] Only)  Result Date: 12/09/2018 CLINICAL DATA:  Acute CVA. EXAM: BILATERAL CAROTID DUPLEX ULTRASOUND TECHNIQUE: Pearline Cables scale imaging, color Doppler and duplex ultrasound were performed of bilateral carotid and vertebral arteries in the neck. COMPARISON:  Brain MRI 12/08/2018 FINDINGS: Criteria: Quantification of carotid stenosis is based on velocity parameters that correlate the residual  internal carotid diameter with NASCET-based stenosis levels, using the diameter of the distal internal carotid lumen as the denominator for stenosis measurement. The following velocity measurements were obtained: RIGHT ICA: 110 cm/sec CCA: 81 cm/sec SYSTOLIC ICA/CCA RATIO:  1.4 ECA: 185 cm/sec LEFT ICA: 134 cm/sec CCA: 989 cm/sec SYSTOLIC ICA/CCA RATIO:  1.3 ECA: 156 cm/sec RIGHT CAROTID ARTERY: Minimal plaque at the right carotid bulb and origin of the external carotid artery. External carotid artery is patent with normal waveform. Normal waveforms and velocities in the internal carotid artery. RIGHT VERTEBRAL ARTERY: Antegrade flow and normal waveform in the right vertebral artery. LEFT CAROTID ARTERY: Small amount of plaque in the distal common carotid artery and bulb. External carotid artery is patent with normal waveform. Slightly elevated velocity in the distal internal carotid artery  is probably related to tortuosity. No significant plaque or stenosis in the left internal carotid artery. LEFT VERTEBRAL ARTERY: Antegrade flow and normal waveform in the left vertebral artery. IMPRESSION: Minimal plaque in the carotid arteries. Estimated degree of stenosis in the internal carotid arteries is less than 50% bilaterally. Patent vertebral arteries with antegrade flow. Electronically Signed   By: Markus Daft M.D.   On: 12/09/2018 08:09   Dg Chest Portable 1 View  Result Date: 12/24/2018 CLINICAL DATA:  Possible stroke. EXAM: PORTABLE CHEST 1 VIEW COMPARISON:  None. FINDINGS: Monitoring leads overlie the patient. Normal cardiac and mediastinal contours. Calcified granuloma right lower lung. No pleural effusion or pneumothorax. Thoracic spine degenerative changes. IMPRESSION: No acute cardiopulmonary process. Electronically Signed   By: Lovey Newcomer M.D.   On: 12/24/2018 08:19   Mr Jodene Nam Head/brain QV Cm  Result Date: 12/08/2018 CLINICAL DATA:  Stroke follow-up EXAM: MRI HEAD WITHOUT CONTRAST MRA HEAD WITHOUT  CONTRAST TECHNIQUE: Multiplanar, multiecho pulse sequences of the brain and surrounding structures were obtained without intravenous contrast. Angiographic images of the head were obtained using MRA technique without contrast. COMPARISON:  10/12/2017 brain MRI FINDINGS: MRI HEAD FINDINGS Brain: Wedge of restricted diffusion extending from the upper right putamen across the corona radiata into the right caudate body. Remote left lenticulostriate infarct that is more anteriorly positioned. Small remote high and posterior right frontal cortex infarct. No acute hemorrhage, hydrocephalus, collection, or masslike finding. Vascular: Arterial findings below. Skull and upper cervical spine: Negative for marrow lesion Sinuses/Orbits: Negative MRA HEAD FINDINGS The carotid arteries are symmetric and diffusely patent. There is a moderate stenosis at the supraclinoid ICA on the left. Advanced distal right M1 segment stenosis which likely also continues into a proximal M2 branch. There is less full MCA branches on the right. The vertebral and basilar arteries are smooth and widely patent. Symmetric robust flow in posterior cerebral arteries. Negative for aneurysm IMPRESSION: 1. 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 2. Remote left basal ganglia infarct. 3. Small remote right frontal cortex infarct. 4. Moderate left supraclinoid ICA narrowing. Electronically Signed   By: Monte Fantasia M.D.   On: 12/08/2018 14:11   Ct Head Code Stroke Wo Contrast  Result Date: 12/24/2018 CLINICAL DATA:  Code stroke. New onset of left-sided weakness and right-sided gaze. Fall. EXAM: CT HEAD WITHOUT CONTRAST TECHNIQUE: Contiguous axial images were obtained from the base of the skull through the vertex without intravenous contrast. COMPARISON:  MRI brain 12/08/2018. FINDINGS: Brain: The subacute infarct involving the posterior right lentiform nucleus and centrum semi ovale is again noted.  No new infarct is present. A remote lacunar infarct involving the left basal ganglia is stable. Periventricular Timme matter disease is stable. Basal ganglia are otherwise intact. Internal capsule is otherwise normal. No focal cortical abnormalities are present. The ventricles are of normal size. No significant extraaxial fluid collection is present. The brainstem and cerebellum are within normal limits. Vascular: Atherosclerotic calcifications are present within the cavernous internal carotid arteries. There is no hyperdense vessel. Skull: Calvarium is intact. No focal lytic or blastic lesions are present. Sinuses/Orbits: The right maxillary sinus is shrunken. There is no active disease. Paranasal sinuses are otherwise clear. Mastoid air cells are clear bilaterally. Globes and orbits are within normal limits. ASPECTS [facility] Stroke Program Early CT Score) - Ganglionic level infarction (caudate, lentiform nuclei, internal capsule, insula, M1-M3 cortex): 7/7 - Supraganglionic infarction (M4-M6 cortex): 3/3 Total score (0-10 with 10 being normal):  10/10 IMPRESSION: 1. No acute intracranial abnormality. 2. Recent infarct involving the posterior right lentiform nucleus and centrum semi ovale is again noted. There is no evidence for expansion of this infarct. 3. Atherosclerosis without hyperdense vessel. 4. ASPECTS is 10/10 The above was relayed via text pager to Dr. Lorraine Lax on 12/24/2018 at 08:02 . Electronically Signed   By: San Morelle M.D.   On: 12/24/2018 08:09    Labs:  CBC: Recent Labs    12/08/18 0929 12/24/18 0750 12/24/18 0757 12/25/18 0650  WBC 4.6 4.4  --  3.5*  HGB 13.4 13.2 13.6 12.8  HCT 39.0 40.4 40.0 37.7  PLT 208 184  --  178    COAGS: Recent Labs    12/24/18 0750  INR 1.07  APTT 22*    BMP: Recent Labs    12/08/18 0929 12/24/18 0750 12/24/18 0757 12/25/18 0650  NA 138 136 134* 137  K 3.9 4.6 4.7 3.9  CL 105 103 103 103  CO2 26 24  --  25  GLUCOSE 88 152* 148*  96  BUN 15 15 18 14   CALCIUM 9.7 9.5  --  9.0  CREATININE 0.61 1.10* 1.10* 0.98  GFRNONAA >60 50*  --  58*  GFRAA >60 58*  --  >60    LIVER FUNCTION TESTS: Recent Labs    12/08/18 0929 12/24/18 0750  BILITOT 0.5 0.9  AST 24 26  ALT 21 21  ALKPHOS 73 63  PROT 7.1 7.0  ALBUMIN 4.3 4.1    TUMOR MARKERS: No results for input(s): AFPTM, CEA, CA199, CHROMGRNA in the last 8760 hours.  Assessment and Plan:  Recent CVA Dec 2019 New onset left side weakness and Rt gaze 12/24/18 Evolving CVA per MR Severe R MCA stenosis Scheduled for cerebral arteriogram Risks and benefits of cerebral angiogram with intervention were discussed with the patient including, but not limited to bleeding, infection, vascular injury, contrast induced renal failure, stroke or even death.  This interventional procedure involves the use of X-rays and because of the nature of the planned procedure, it is possible that we will have prolonged use of X-ray fluoroscopy.  Potential radiation risks to you include (but are not limited to) the following: - A slightly elevated risk for cancer  several years later in life. This risk is typically less than 0.5% percent. This risk is low in comparison to the normal incidence of human cancer, which is 33% for women and 50% for men according to the [employer]. - Radiation induced injury can include skin redness, resembling a rash, tissue breakdown / ulcers and hair loss (which can be temporary or permanent).   The likelihood of either of these occurring depends on the difficulty of the procedure and whether you are sensitive to radiation due to previous procedures, disease, or genetic conditions.   IF your procedure requires a prolonged use of radiation, you will be notified and given written instructions for further action.  It is your responsibility to monitor the irradiated area for the 2 weeks following the procedure and to notify your physician if you are  concerned that you have suffered a radiation induced injury.    All of the patient's questions were answered, patient is agreeable to proceed.  Consent signed and in chart.  Thank you for this interesting consult.  I greatly enjoyed meeting Verlean Allport and look forward to participating in their care.  A copy of this report was sent to the requesting provider on this date.  Electronically Signed: Lavonia Drafts, PA-C 12/25/2018, 9:05 AM   I spent a total of 40 Minutes    in face to face in clinical consultation, greater than 50% of which was counseling/coordinating care for cerebral arteriogram

## 2018-12-25 NOTE — Progress Notes (Signed)
SLP Cancellation Note  Patient Details Name: Loma Dubuque MRN: [id number] DOB: [birth year]   Cancelled treatment:       Reason Eval/Treat Not Completed: Other (comment)  Pt passed [facility] swallow screen; therefore, no formalized SLP swallow eval is needed per protocol. Will sign off.      Horton Marshall 12/25/2018, 12:02 PM   Tobie Poet I. Hardin Negus, [place], [place] Office number [phone] Pager [phone]

## 2018-12-25 NOTE — Care Management Note (Signed)
Case Management Note  Patient Details  Name: Zerline Cloninger MRN: [id number] Date of Birth: 12/14/1945  Subjective/Objective:    Pt admitted with r/o CVA. She is from home with her spouse.  DME: none No issues obtaining her medications.  No issues with transportation.                Action/Plan: Outpatient therapy recommended. CM met with the patient and she has been to the evaluation visit for Outpatient therapy at [facility]. She would like to continue with these services. She has transportation to the visits. CM following and will reenter outpatient therapy orders at d/c once all therapies have seen her.  Expected Discharge Date:                  Expected Discharge Plan:  OP Rehab  In-House Referral:     Discharge planning Services  CM Consult  Post Acute Care Choice:    Choice offered to:     DME Arranged:    DME Agency:     HH Arranged:    HH Agency:     Status of Service:  In process, will continue to follow  If discussed at Long Length of Stay Meetings, dates discussed:    Additional Comments:  Kelli F Willard, RN 12/25/2018, 11:38 AM  

## 2018-12-25 NOTE — Progress Notes (Signed)
Attempted x2 to see pt this am and pt out of room at tests both occasions. Will attempt back as schedule allows. Jinger Neighbors, [place] [phone]

## 2018-12-26 ENCOUNTER — Other Ambulatory Visit (HOSPITAL_COMMUNITY): Payer: Medicare Other

## 2018-12-26 ENCOUNTER — Inpatient Hospital Stay (HOSPITAL_COMMUNITY): Payer: Medicare Other

## 2018-12-26 DIAGNOSIS — J101 Influenza due to other identified influenza virus with other respiratory manifestations: Secondary | ICD-10-CM

## 2018-12-26 DIAGNOSIS — Z9862 Peripheral vascular angioplasty status: Secondary | ICD-10-CM

## 2018-12-26 DIAGNOSIS — I9589 Other hypotension: Secondary | ICD-10-CM

## 2018-12-26 DIAGNOSIS — R509 Fever, unspecified: Secondary | ICD-10-CM

## 2018-12-26 LAB — BASIC METABOLIC PANEL
Anion gap: 7 (ref 5–15)
BUN: 13 mg/dL (ref 8–23)
CO2: 25 mmol/L (ref 22–32)
Calcium: 8.7 mg/dL — ABNORMAL LOW (ref 8.9–10.3)
Chloride: 104 mmol/L (ref 98–111)
Creatinine, Ser: 0.71 mg/dL (ref 0.44–1.00)
GFR calc Af Amer: >60 mL/min (ref >60)
GFR calc non Af Amer: >60 mL/min (ref >60)
Glucose, Bld: 96 mg/dL (ref 70–99)
Potassium: 3.6 mmol/L (ref 3.5–5.1)
Sodium: 136 mmol/L (ref 135–145)

## 2018-12-26 LAB — CBC
HCT: 36 % (ref 36.0–46.0)
Hemoglobin: 12 g/dL (ref 12.0–15.0)
MCH: 30.2 pg (ref 26.0–34.0)
MCHC: 33.3 g/dL (ref 30.0–36.0)
MCV: 90.7 fL (ref 80.0–100.0)
Platelets: 147 10*3/uL — ABNORMAL LOW (ref 150–400)
RBC: 3.97 MIL/uL (ref 3.87–5.11)
RDW: 11.4 % — ABNORMAL LOW (ref 11.5–15.5)
WBC: 2.8 10*3/uL — ABNORMAL LOW (ref 4.0–10.5)
nRBC: 0 % (ref 0.0–0.2)

## 2018-12-26 LAB — INFLUENZA PANEL BY PCR (TYPE A & B)
Influenza A By PCR: NEGATIVE
Influenza B By PCR: POSITIVE — AB

## 2018-12-26 MED ORDER — OSELTAMIVIR PHOSPHATE 75 MG PO CAPS
75.0000 mg | ORAL_CAPSULE | Freq: Two times a day (BID) | ORAL | Status: DC
  Administered 2018-12-26 – 2018-12-27 (×3): 75 mg via ORAL
  Filled 2018-12-26 (×3): qty 1

## 2018-12-26 MED ORDER — TICAGRELOR 90 MG PO TABS
90.0000 mg | ORAL_TABLET | Freq: Two times a day (BID) | ORAL | Status: DC
  Administered 2018-12-26: 90 mg via ORAL
  Filled 2018-12-26: qty 1

## 2018-12-26 MED ORDER — SODIUM CHLORIDE 0.9 % IV SOLN
INTRAVENOUS | Status: DC
  Administered 2018-12-26: 02:00:00 via INTRAVENOUS

## 2018-12-26 MED ORDER — ASPIRIN EC 81 MG PO TBEC
81.0000 mg | DELAYED_RELEASE_TABLET | Freq: Every day | ORAL | Status: DC
  Administered 2018-12-27: 81 mg via ORAL
  Filled 2018-12-26: qty 1

## 2018-12-26 MED ORDER — TICAGRELOR 90 MG PO TABS: 90.0000 mg | ORAL_TABLET | Freq: Two times a day (BID) | ORAL | Status: DC

## 2018-12-26 MED ORDER — SODIUM CHLORIDE 0.9 % IV SOLN
INTRAVENOUS | Status: DC
  Administered 2018-12-27: 01:00:00 via INTRAVENOUS

## 2018-12-26 NOTE — Evaluation (Signed)
Occupational Therapy Evaluation Patient Details Name: Shannon [name] MRN: [id number] DOB: 1946-02-11 Today's Date: 12/26/2018    History of Present Illness Pt is a 73 y/o F who presented with increased weakness, dizziness and profuse sweating.  Recently patient was discharge s/p infarct in R basal ganglia and acute lateral lenticulostriate infarct on the R with R M1 segment stenosis, small remote frontal cortex infarct.    Clinical Impression   Pt overall min guard assist for functional mobility and standing at the sink for grooming tasks.  She presents with mild left hand weakness at 3+/5 as well as minor FM coordination deficits.  Feel she will benefit from acute care OT to progress hand function for ADL independence.  Do not anticipate the need for post acute OT at discharge or DME.      Follow Up Recommendations  No OT follow up    Equipment Recommendations  None recommended by OT       Precautions / Restrictions Restrictions Weight Bearing Restrictions: No      Mobility Bed Mobility Overal bed mobility: Modified Independent                Transfers Overall transfer level: Needs assistance Equipment used: None Transfers: Sit to/from Stand Sit to Stand: Min guard         General transfer comment: Pt with slower than normal mobility speed but no LOB noted with basic tasks and simulated selfcare tasks.     Balance Overall balance assessment: Needs assistance Sitting-balance support: No upper extremity supported;Feet supported Sitting balance-Leahy Scale: Good     Standing balance support: No upper extremity supported;During functional activity Standing balance-Leahy Scale: Fair                             ADL either performed or assessed with clinical judgement   ADL Overall ADL's : Needs assistance/impaired Eating/Feeding: Independent   Grooming: Wash/dry hands;Supervision/safety   Upper Body Bathing: Set up;Sitting   Lower Body Bathing: Sit  to/from stand;Min guard   Upper Body Dressing : Set up;Sitting   Lower Body Dressing: Min guard   Toilet Transfer: Min guard;Comfort height toilet   Toileting- Clothing Manipulation and Hygiene: Min guard;Sit to/from stand       Functional mobility during ADLs: Min guard General ADL Comments: Pt with slower gait than normal and slight dynamic balance deficits.  Educated pt on FM coordination tasks and provided with stress ball to work on strengthening.  Pt able to demonstrate use of stress ball for gross digit flexion as well as in-hand manipulation.      Vision Baseline Vision/History: Wears glasses;Cataracts Wears Glasses: At all times Patient Visual Report: Eye fatigue/eye pain/headache Vision Assessment?: Yes Eye Alignment: Within Functional Limits Ocular Range of Motion: Within Functional Limits Alignment/Gaze Preference: Within Defined Limits Tracking/Visual Pursuits: Decreased smoothness of horizontal tracking Saccades: Within functional limits Convergence: Impaired (comment)(decreased convergence noted) Visual Fields: No apparent deficits(Pt with decreased visual field on each side secondary to eye folds)            Pertinent Vitals/Pain Pain Assessment: No/denies pain     Hand Dominance Right   Extremity/Trunk Assessment Upper Extremity Assessment LUE Deficits / Details: Pt demonstrates strength at 4/5 for all joints except the hand which is 3+/5.  Slower coordination with FM tasks compared to the right, but still able to use functionally with ADLs at supervision level. LUE Sensation: WNL LUE Coordination: decreased fine motor  Lower Extremity Assessment Lower Extremity Assessment: Defer to PT evaluation   Cervical / Trunk Assessment Cervical / Trunk Assessment: Normal   Communication Communication Communication: No difficulties   Cognition Arousal/Alertness: Awake/alert Behavior During Therapy: WFL for tasks assessed/performed Overall Cognitive Status:  Within Functional Limits for tasks assessed                                                Home Living Family/patient expects to be discharged to:: Private residence Living Arrangements: Spouse/significant other Available Help at Discharge: Family;Available 24 hours/day Type of Home: House Home Access: Stairs to enter [employer] of Steps: 1 Entrance Stairs-Rails: None Home Layout: Two level;Able to live on main level with bedroom/bathroom Alternate Level Stairs-Number of Steps: flight Alternate Level Stairs-Rails: Left;Right(L halfway up, R halfway up) Bathroom Shower/Tub: Occupational psychologist: Standard Bathroom Accessibility: Yes   Home Equipment: Shower seat - built in          Prior Functioning/Environment Level of Independence: Independent        Comments: Ambulating without AD.  1 fall in the past 6 months (1 day PTA).  Ind with ADLs, still driving.         OT Problem List: Decreased strength;Impaired balance (sitting and/or standing);Decreased coordination;Impaired UE functional use      OT Treatment/Interventions: Self-care/ADL training;Therapeutic activities;Balance training;Therapeutic exercise;Neuromuscular education;Patient/family education    OT Goals(Current goals can be found in the care plan section) Acute Rehab OT Goals Patient Stated Goal: To get back to normal daily function. OT Goal Formulation: With patient/family Time For Goal Achievement: 01/02/19 Potential to Achieve Goals: Good  OT Frequency: Min 2X/week              AM-PAC OT "6 Clicks" Daily Activity     Outcome Measure Help from another person eating meals?: None Help from another person taking care of personal grooming?: None Help from another person toileting, which includes using toliet, bedpan, or urinal?: A Little Help from another person bathing (including washing, rinsing, drying)?: A Little Help from another person to put on and  taking off regular upper body clothing?: None Help from another person to put on and taking off regular lower body clothing?: A Little 6 Click Score: 21   End of Session Equipment Utilized During Treatment: Gait belt  Activity Tolerance: Patient tolerated treatment well Patient left: in chair;with call bell/phone within reach;with family/visitor present  OT Visit Diagnosis: Unsteadiness on feet (R26.81);Muscle weakness (generalized) (M62.81)                Time: 4970-2637 OT Time Calculation (min): 39 min Charges:  OT General Charges $OT Visit: 1 Visit OT Evaluation $OT Eval Moderate Complexity: 1 Mod OT Treatments $Self Care/Home Management : 23-37 mins    Aubrianna Orchard OTR/L 12/26/2018, 8:59 AM

## 2018-12-26 NOTE — Progress Notes (Addendum)
   Subjective: Patient spiked a fever to 102.5 yesterday. She received Tylenol. Afebrile this am. Patient reports that she feels well except her temperature fluctuates from hot to cold. She had sweating episodes at home prior to admission, but hasn't had any since she's been in the hospital. She has a cough with scant sputum production. She denies abdominal pain. She reports she was able to walk to the bathroom this morning without any balance difficulties. She denies slurred speech or dizziness.   Objective:  Vital signs in last 24 hours: Vitals:   12/25/18 1952 12/26/18 0033 12/26/18 0229 12/26/18 0412  BP: (!) 142/55 (!) 131/50  (!) 127/57  Pulse: 84 (!) 101  (!) 101  Resp: 18 18  18   Temp: 99.3 F (37.4 C) (!) 102.5 F (39.2 C) 99.1 F (37.3 C) 99.4 F (37.4 C)  TempSrc: Oral Oral Oral Oral  SpO2: 99% 100%  99%  Weight:       Gen: laying comfortably in bed, no distress Pulm: CTAB Ext: no edema Neuro: 5/5 strength in all 4 extremities.   Assessment/Plan:  Active Problems:   CVA (cerebral vascular accident) ([place])   Hypothyroidism   Essential hypertension   TIA (transient ischemic attack)  [name] is a 73 y.o female withthyroid disease, HTN anda recent stroke in 12/19 with severe right M1 stenosis who presented to the ED after being found unresponsive and hypotensive. With IVF her blood pressure improved and her symptoms resolved. Neurology was consulted and she was subsequently admitted for further evaluation and management of her severe right M1 stenosis.   TIA Recent CVA Right M1 Stenosis Patient had a recent stroke 12/27 and presents with syncope, right gaze deviation and left sided weakness in the setting of hypotension. MRI this admission without acute CVA or propagation of prior CVA.   - Cerebral angiogram showed right M1 high grade stenosis 90 % and 50% stenosis of left M1 - Neuro IR plans to do a right M1 stent vs angioplasty, scheduling in progress   -  Cardiology consulted for TEE/loop recorder to rule out atrial fibrillation, scheduled for 3 pm today - Permissive hypertension - Continue DAPT with ASA 325 and plavix 75 for 3 months and then plavix alone given intracranial stenosis - Cardiac monitoring - PT recommends outpatient PT - OT recommends no OT f/u - Appreciate neurology and cards recs   HTN - 127/57, goal 130-150 due to right M1 high grade stenosis - Holding home Lisinopril  - Allowing permissive HTN  Fever Patient febrile yesterday, tmax 102.5. She presented with a non-productive cough. CXR did not show any consolidation or infiltrate. No other s/sx of infection - ordered influenza pcr - ordered blood and urine cultures  Thyroid disease - Continue levothyroxine50 mcg MWF and75 mcgT/Th/S/Su   Dispo: Anticipated discharge pending stent placement and TEE/loop recorder.   Mike Craze, DO 12/26/2018, 6:45 AM Pager: [phone]

## 2018-12-26 NOTE — Progress Notes (Addendum)
EP called to evaluate for loop implant.  The patient has been intermittently febrile As recently as 00:33 last night temp of 102.5 Today came positive of Influenza B  Given febrile illness, will defer implant and recommend 30 day monitoring to begine with and EP referal out patient if no findings.  Tommye Standard, PA-C   I agree with 30 day monitor at discharge. Could consider ILR if 30 day monitor is unrevealing.  Electrophysiology team to see as needed while here. Please call with questions.  Thompson Grayer MD, [facility] 12/26/2018

## 2018-12-26 NOTE — Progress Notes (Signed)
STROKE TEAM PROGRESS NOTE   SUBJECTIVE (INTERVAL HISTORY) Her husband and daughter are at the bedside.  She continued to have fever, and flu panel showed positive for fluB. Her TEE and loop recorder cancelled. TEE was scheduled for later and NP recommend 30 day cardiac event monitoring instead of loop recorder. Discussed with Dr Estanislado Pandy, pt will schedule for right MCA angioplasty vs. Stenting next Thursday.    OBJECTIVE Temp:  [98.9 F (37.2 C)-102.5 F (39.2 C)] 99.2 F (37.3 C) (01/14 0910) Pulse Rate:  [83-107] 85 (01/14 0910) Cardiac Rhythm: Sinus tachycardia (01/14 0800) Resp:  [16-18] 17 (01/14 0910) BP: (127-147)/(50-62) 147/57 (01/14 0910) SpO2:  [98 %-100 %] 98 % (01/14 0910)  Recent Labs  Lab 12/24/18 0745  GLUCAP 136*   Recent Labs  Lab 12/24/18 0750 12/24/18 0757 12/25/18 0650 12/26/18 0454  NA 136 134* 137 136  K 4.6 4.7 3.9 3.6  CL 103 103 103 104  CO2 24  --  25 25  GLUCOSE 152* 148* 96 96  BUN 15 18 14 13   CREATININE 1.10* 1.10* 0.98 0.71  CALCIUM 9.5  --  9.0 8.7*   Recent Labs  Lab 12/24/18 0750  AST 26  ALT 21  ALKPHOS 63  BILITOT 0.9  PROT 7.0  ALBUMIN 4.1   Recent Labs  Lab 12/24/18 0750 12/24/18 0757 12/25/18 0650 12/26/18 0454  WBC 4.4  --  3.5* 2.8*  NEUTROABS 3.0  --   --   --   HGB 13.2 13.6 12.8 12.0  HCT 40.4 40.0 37.7 36.0  MCV 92.9  --  91.7 90.7  PLT 184  --  178 147*   No results for input(s): CKTOTAL, CKMB, CKMBINDEX, TROPONINI in the last 168 hours. Recent Labs    12/24/18 0750  LABPROT 13.8  INR 1.07   No results for input(s): COLORURINE, LABSPEC, PHURINE, GLUCOSEU, HGBUR, BILIRUBINUR, KETONESUR, PROTEINUR, UROBILINOGEN, NITRITE, LEUKOCYTESUR in the last 72 hours.  Invalid input(s): APPERANCEUR     Component Value Date/Time   CHOL 199 12/09/2018 0352   TRIG 110 12/09/2018 0352   HDL 45 12/09/2018 0352   CHOLHDL 4.4 12/09/2018 0352   VLDL 22 12/09/2018 0352   LDLCALC 132 (H) 12/09/2018 0352   Lab  Results  Component Value Date   HGBA1C 5.4 12/09/2018   No results found for: LABOPIA, COCAINSCRNUR, LABBENZ, AMPHETMU, THCU, LABBARB  No results for input(s): ETH in the last 168 hours.  I have personally reviewed the radiological images below and agree with the radiology interpretations.  Dg Chest 2 View  Result Date: 12/26/2018 CLINICAL DATA:  Fever, cough for several days EXAM: CHEST - 2 VIEW COMPARISON:  Portable chest x-ray of 12/24/2018 FINDINGS: No active infiltrate or effusion is seen. A calcified granuloma is again noted at the right lung base. Mediastinal and hilar contours are unremarkable and the heart is within upper limits of normal in size. No bony abnormality is seen. IMPRESSION: No active lung disease.  Calcified granuloma at the right lung base. Electronically Signed   By: Ivar Drape M.D.   On: 12/26/2018 10:16   Ct Head Wo Contrast  Result Date: 12/08/2018 CLINICAL DATA:  Right-sided headache for 6 days. Fatigue. Right-sided mouth weakness EXAM: CT HEAD WITHOUT CONTRAST TECHNIQUE: Contiguous axial images were obtained from the base of the skull through the vertex without intravenous contrast. COMPARISON:  Head CT and brain MRI October 12, 2017 FINDINGS: Brain: The ventricles are normal in size and configuration. There is no intracranial  mass, hemorrhage, extra-axial fluid collection, or midline shift. There is decreased attenuation involving much of the right lentiform nucleus as well as involving portions of the right internal and external capsule, concerning for acute right basal ganglia infarct. There is evidence of a prior infarct involving the anterior limb of the left external capsule, stable. Brain parenchyma otherwise appears unremarkable. Vascular: No hyperdense vessel. There is calcification in each carotid siphon region. Skull: The bony calvarium appears intact. Sinuses/Orbits: There is mild mucosal thickening in several ethmoid air cells. Other visualized paranasal  sinuses are clear. Visualized orbits appear symmetric bilaterally. Other: Mastoid air cells are clear. IMPRESSION: Apparent acute infarct involving much of the right basal ganglia. Localized cytotoxic edema in this area noted. Prior infarct anterior limb left external capsule. No mass or hemorrhage. There are foci of arterial vascular calcification. There is mild mucosal thickening in several ethmoid air cells. Electronically Signed   By: Lowella Grip III M.D.   On: 12/08/2018 09:42   Ct Cervical Spine Wo Contrast  Result Date: 12/24/2018 CLINICAL DATA:  Episode of left-sided weakness and right-sided gaze. Fall. EXAM: CT CERVICAL SPINE WITHOUT CONTRAST TECHNIQUE: Multidetector CT imaging of the cervical spine was performed without intravenous contrast. Multiplanar CT image reconstructions were also generated. COMPARISON:  None. FINDINGS: Alignment: AP alignment is anatomic. There straightening of the normal cervical lordosis. The patient is an a hard collar. Skull base and vertebrae: Craniocervical junction is normal. Vertebral body heights are maintained. No acute fracture is present. Soft tissues and spinal canal: Thyroid is markedly atrophic or surgically absent. No adenopathy is present. Salivary glands are within normal limits. Vascular calcifications are noted at the left carotid bifurcation, at the origin of the right vertebral artery, and at the innominate artery. Disc levels: Uncovertebral disease results in moderate right foraminal narrowing at C5-6 and mild bilateral foraminal narrowing at C6-7. Upper chest: The lung apices are clear. Thoracic inlet is within normal limits. IMPRESSION: 1. No acute trauma. 2. Atherosclerosis. 3. Degenerative changes of the cervical spine are most evident at C5-6 on the right and bilaterally at C6-7. 4. Straightening of the normal cervical lordosis. This may be related to positioning as the patient is an a hard collar. Electronically Signed   By: San Morelle M.D.   On: 12/24/2018 08:14   Mr Brain Wo Contrast  Result Date: 12/24/2018 CLINICAL DATA:  Focal neuro deficit. New onset left-sided weakness and right-sided gaze. Fall. Recent right basal ganglia infarct. EXAM: MRI HEAD WITHOUT CONTRAST TECHNIQUE: Multiplanar, multiecho pulse sequences of the brain and surrounding structures were obtained without intravenous contrast. COMPARISON:  CT head without contrast 12/24/2018. MRI brain 12/08/2018. FINDINGS: Brain: The superior aspect of the previous infarct demonstrates some restricted diffusion signal. There is no significant restricted diffusion on the ADC map. No new areas of infarction are evident. T2 signal changes are associated with the recent infarct. Periventricular Rouse matter changes are otherwise stable. The remote infarct of the left caudate head and lentiform nucleus is again seen. A remote lacunar infarct is again noted in the left thalamus. No hemorrhage or mass lesion is present. The ventricles are of normal size. No significant extraaxial fluid collection is present. The internal auditory canals are within normal limits. The brainstem and cerebellum are within normal limits. Vascular: Flow is present in the major intracranial arteries. Skull and upper cervical spine: The craniocervical junction is normal. Upper cervical spine is within normal limits. Marrow signal is unremarkable. Sinuses/Orbits: The right maxillary sinus is shrunken  without active disease. Minimal mucosal thickening is present in the ethmoid air cells. Mastoid air cells are clear. The globes and orbits are within normal limits. IMPRESSION: 1. Evolving previous infarct with some residual diffusion signal changes in the right centrum semi ovale. There is no significant restricted diffusion. 2. No new infarcts. 3. Stable remote infarcts involving the left caudate, left lentiform nucleus, and left thalamus. 4. 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 Electronically Signed   By: San Morelle M.D.   On: 12/24/2018 14:22   Mr Brain Wo Contrast  Result Date: 12/08/2018 CLINICAL DATA:  Stroke follow-up EXAM: MRI HEAD WITHOUT CONTRAST MRA HEAD WITHOUT CONTRAST TECHNIQUE: Multiplanar, multiecho pulse sequences of the brain and surrounding structures were obtained without intravenous contrast. Angiographic images of the head were obtained using MRA technique without contrast. COMPARISON:  10/12/2017 brain MRI FINDINGS: MRI HEAD FINDINGS Brain: Wedge of restricted diffusion extending from the upper right putamen across the corona radiata into the right caudate body. Remote left lenticulostriate infarct that is more anteriorly positioned. Small remote high and posterior right frontal cortex infarct. No acute hemorrhage, hydrocephalus, collection, or masslike finding. Vascular: Arterial findings below. Skull and upper cervical spine: Negative for marrow lesion Sinuses/Orbits: Negative MRA HEAD FINDINGS The carotid arteries are symmetric and diffusely patent. There is a moderate stenosis at the supraclinoid ICA on the left. Advanced distal right M1 segment stenosis which likely also continues into a proximal M2 branch. There is less full MCA branches on the right. The vertebral and basilar arteries are smooth and widely patent. Symmetric robust flow in posterior cerebral arteries. Negative for aneurysm IMPRESSION: 1. 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 2. Remote left basal ganglia infarct. 3. Small remote right frontal cortex infarct. 4. Moderate left supraclinoid ICA narrowing. Electronically Signed   By: Monte Fantasia M.D.   On: 12/08/2018 14:11   US Carotid Bilateral (at [facility] Only)  Result Date: 12/09/2018 CLINICAL DATA:  Acute CVA. EXAM: BILATERAL CAROTID DUPLEX ULTRASOUND TECHNIQUE: Pearline Cables scale imaging, color Doppler and duplex ultrasound were performed of bilateral carotid  and vertebral arteries in the neck. COMPARISON:  Brain MRI 12/08/2018 FINDINGS: Criteria: Quantification of carotid stenosis is based on velocity parameters that correlate the residual internal carotid diameter with NASCET-based stenosis levels, using the diameter of the distal internal carotid lumen as the denominator for stenosis measurement. The following velocity measurements were obtained: RIGHT ICA: 110 cm/sec CCA: 81 cm/sec SYSTOLIC ICA/CCA RATIO:  1.4 ECA: 185 cm/sec LEFT ICA: 134 cm/sec CCA: 893 cm/sec SYSTOLIC ICA/CCA RATIO:  1.3 ECA: 156 cm/sec RIGHT CAROTID ARTERY: Minimal plaque at the right carotid bulb and origin of the external carotid artery. External carotid artery is patent with normal waveform. Normal waveforms and velocities in the internal carotid artery. RIGHT VERTEBRAL ARTERY: Antegrade flow and normal waveform in the right vertebral artery. LEFT CAROTID ARTERY: Small amount of plaque in the distal common carotid artery and bulb. External carotid artery is patent with normal waveform. Slightly elevated velocity in the distal internal carotid artery is probably related to tortuosity. No significant plaque or stenosis in the left internal carotid artery. LEFT VERTEBRAL ARTERY: Antegrade flow and normal waveform in the left vertebral artery. IMPRESSION: Minimal plaque in the carotid arteries. Estimated degree of stenosis in the internal carotid arteries is less than 50% bilaterally. Patent vertebral arteries with antegrade flow. Electronically Signed   By: Markus Daft M.D.   On:  12/09/2018 08:09   Dg Chest Portable 1 View  Result Date: 12/24/2018 CLINICAL DATA:  Possible stroke. EXAM: PORTABLE CHEST 1 VIEW COMPARISON:  None. FINDINGS: Monitoring leads overlie the patient. Normal cardiac and mediastinal contours. Calcified granuloma right lower lung. No pleural effusion or pneumothorax. Thoracic spine degenerative changes. IMPRESSION: No acute cardiopulmonary process. Electronically Signed    By: Lovey Newcomer M.D.   On: 12/24/2018 08:19   Mr Jodene Nam Head/brain DJ Cm  Result Date: 12/08/2018 CLINICAL DATA:  Stroke follow-up EXAM: MRI HEAD WITHOUT CONTRAST MRA HEAD WITHOUT CONTRAST TECHNIQUE: Multiplanar, multiecho pulse sequences of the brain and surrounding structures were obtained without intravenous contrast. Angiographic images of the head were obtained using MRA technique without contrast. COMPARISON:  10/12/2017 brain MRI FINDINGS: MRI HEAD FINDINGS Brain: Wedge of restricted diffusion extending from the upper right putamen across the corona radiata into the right caudate body. Remote left lenticulostriate infarct that is more anteriorly positioned. Small remote high and posterior right frontal cortex infarct. No acute hemorrhage, hydrocephalus, collection, or masslike finding. Vascular: Arterial findings below. Skull and upper cervical spine: Negative for marrow lesion Sinuses/Orbits: Negative MRA HEAD FINDINGS The carotid arteries are symmetric and diffusely patent. There is a moderate stenosis at the supraclinoid ICA on the left. Advanced distal right M1 segment stenosis which likely also continues into a proximal M2 branch. There is less full MCA branches on the right. The vertebral and basilar arteries are smooth and widely patent. Symmetric robust flow in posterior cerebral arteries. Negative for aneurysm IMPRESSION: 1. 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 2. Remote left basal ganglia infarct. 3. Small remote right frontal cortex infarct. 4. Moderate left supraclinoid ICA narrowing. Electronically Signed   By: Monte Fantasia M.D.   On: 12/08/2018 14:11   Ct Head Code Stroke Wo Contrast  Result Date: 12/24/2018 CLINICAL DATA:  Code stroke. New onset of left-sided weakness and right-sided gaze. Fall. EXAM: CT HEAD WITHOUT CONTRAST TECHNIQUE: Contiguous axial images were obtained from the base of the skull through the vertex without  intravenous contrast. COMPARISON:  MRI brain 12/08/2018. FINDINGS: Brain: The subacute infarct involving the posterior right lentiform nucleus and centrum semi ovale is again noted. No new infarct is present. A remote lacunar infarct involving the left basal ganglia is stable. Periventricular Schank matter disease is stable. Basal ganglia are otherwise intact. Internal capsule is otherwise normal. No focal cortical abnormalities are present. The ventricles are of normal size. No significant extraaxial fluid collection is present. The brainstem and cerebellum are within normal limits. Vascular: Atherosclerotic calcifications are present within the cavernous internal carotid arteries. There is no hyperdense vessel. Skull: Calvarium is intact. No focal lytic or blastic lesions are present. Sinuses/Orbits: The right maxillary sinus is shrunken. There is no active disease. Paranasal sinuses are otherwise clear. Mastoid air cells are clear bilaterally. Globes and orbits are within normal limits. ASPECTS [facility] Stroke Program Early CT Score) - Ganglionic level infarction (caudate, lentiform nuclei, internal capsule, insula, M1-M3 cortex): 7/7 - Supraganglionic infarction (M4-M6 cortex): 3/3 Total score (0-10 with 10 being normal): 10/10 IMPRESSION: 1. No acute intracranial abnormality. 2. Recent infarct involving the posterior right lentiform nucleus and centrum semi ovale is again noted. There is no evidence for expansion of this infarct. 3. Atherosclerosis without hyperdense vessel. 4. ASPECTS is 10/10 The above was relayed via text pager to Dr. Lorraine Lax on 12/24/2018 at 08:02 . Electronically Signed   By: San Morelle M.D.   On:  12/24/2018 08:09   DSA -  1.Severe 90 % stenosis of RT MCA M 1 seg  2.50 % stenosis of LT MCA M 1. 3 .Severe stenosis of 80 tom 90 % of LT VA origin   PHYSICAL EXAM  Temp:  [98.9 F (37.2 C)-102.5 F (39.2 C)] 99.2 F (37.3 C) (01/14 0910) Pulse Rate:  [83-107] 85 (01/14  0910) Resp:  [16-18] 17 (01/14 0910) BP: (127-147)/(50-62) 147/57 (01/14 0910) SpO2:  [98 %-100 %] 98 % (01/14 0910)  General - Well nourished, well developed, in no apparent distress.  Ophthalmologic - fundi not visualized due to noncooperation.  Cardiovascular - Regular rate and rhythm, tachycardia due to fever.  Mental Status -  Level of arousal and orientation to time, place, and person were intact. Language including expression, naming, repetition, comprehension was assessed and found intact. Fund of Knowledge was assessed and was intact.  Cranial Nerves II - XII - II - Visual field intact OU. III, IV, VI - Extraocular movements intact. V - Facial sensation intact bilaterally. VII - Facial movement intact bilaterally. VIII - Hearing & vestibular intact bilaterally. X - Palate elevates symmetrically. XI - Chin turning & shoulder shrug intact bilaterally. XII - Tongue protrusion intact.  Motor Strength - The patient's strength was normal in all extremities and pronator drift was absent.  Bulk was normal and fasciculations were absent.   Motor Tone - Muscle tone was assessed at the neck and appendages and was normal.  Reflexes - The patient's reflexes were symmetrical in all extremities and she had no pathological reflexes.  Sensory - Light touch, temperature/pinprick were assessed and were symmetrical.    Coordination - The patient had normal movements in the hands with no ataxia or dysmetria.  Tremor was absent.  Gait and Station - deferred.   ASSESSMENT/PLAN [name] is a 73 y.o. female with history of HTN and recent stroke admitted for left hemiplegia and right gaze with severe hypotension. No tPA given due to symptoms resolved.    TIA - due to right MCA high grade stenosis in the setting of hypotension  MRI  Subacute right BG/CR infarct, no acute finding  DSA - right M1 90% stenosis  Plan for right MCA angioplasty vs. Stenting with Dr Estanislado Pandy next  Thursday 01/04/19. If pt discharged before that date, pt will come back for procedure as outpt . Dr. Estanislado Pandy office will arrange.   P2 Y 12 174 - plavix switched to brilinta in preparation of right MCA stenting  lovenox for VTE prophylaxis  aspirin 81 mg daily and clopidogrel 75 mg daily prior to admission, now on aspirin 325 mg daily and clopidogrel 75 mg daily. Continue DAPT with ASA 81 and brilinta in preparation of right MCA stenting.   Patient counseled to be compliant with her antithrombotic medications  Ongoing aggressive stroke risk factor management  Therapy recommendations:  outpt PT/OT   Disposition:  pending  Recent right MCA stroke   12/08/18 -admitted for slurred speech and generalized weakness, dizziness.  CT showed right basal ganglia infarct  MRI showed right BG/CR infarct  MRI showed right M1 high-grade stenosis  Carotid Doppler unremarkable  2D Echo EF 60 to 65%  LDL 132  HgbA1c 5.4  Put on ASA 81 and plavix 75 on discharge  Pt no significant stroke risk factors, with isolated right M1 high grade stenosis, concerning for cardioembolic source, agree with EP and recommend 30 day cardiac event monitoring as outpt to rule out afib.  No need for TEE this time, will cancel. Pt and family are in agreement.   Hypotension  On EMS arrival, BP 60s  Stroke symptoms resolved after BP normalization  Orthostatic vitals unremarkable after IVF  D/c home BP meds - lisinopril   BP goal 130-150 due to right M1 high grade stenosis  Hyperlipidemia  Home meds:  lipitor 40   LDL 132, goal < 70  Now on lipitor 40  Continue statin at discharge  [place] B infection  Fever with T-max 102.2  Coughing since Friday  Tylenol as needed  Flu penal + for fluB  CXR yesterday no acute process  On IV fluid  Other Stroke Risk Factors  Advanced age  Other Active Problems  Leukopenia WBC 3.5->2.8  Thrombocytopenia Platelet 178->147  Hospital day #  2  Neurology will sign off. Please call with questions. Pt will follow up with stroke clinic Dr. Leonie Man at [facility] in about 4 weeks after discharge. Thanks for the consult.   Rosalin Hawking, MD PhD Stroke Neurology 12/26/2018 2:52 PM    To contact Stroke Continuity provider, please refer to [email or web address] After hours, contact General Neurology

## 2018-12-26 NOTE — Progress Notes (Signed)
Patient ID: Shannon [name], female   DOB: Aug 14, 1946, 73 y.o.   MRN: [id number]   Yesterday: S/P 4 vessel cerebral arteriogram  RT CFA approach Findings. 1.Severe 90 % stenosis of RT MCA M 1 seg  2.50 % stenosis of LT MCA M 1. 3 .Severe stenosis of 80 tom 90 % of LT VA origin  Discussed with Dr Erlinda Hong Plan for R MCA angioplasty/poss stent placement in IR with Dr Estanislado Pandy  Anesthesia is available Jan 23 Plan for procedure as OP that day Discussed with Dr Erlinda Hong-- he is aware and agreeable  **Change ASA 325 to ASA 81 And **Change Plavix 75 mg to Brilinta 90 mg BID  These medications have been changed in Orders/Med orders  Dr Erlinda Hong will discuss with pt and family

## 2018-12-26 NOTE — Progress Notes (Signed)
Medicine attending: I examined this patient today together with resident physician Dr Harlow Ohms and I concur with her evaluation and management plan.  Cerebral angiogram confirmed pathology showing on initial CT angiogram.  Severe stenosis middle cerebral artery right M1 segment and 50% stenosis left M1 segment.  She does appear to be a candidate for stent placement which will be arranged by interventional radiology. Placement of a loop recorder was recommended by neurology.  I am not sure why since we know that her stroke is likely related to stenotic cerebral vessels.  In any event, she developed a fever over 102 during the night.  Persistent cough.  She tested positive for influenza B.  Placement of loop recorder canceled.  Neurologically she remains stable.  No focal deficits.  No new findings.  She states she was able to ambulate without ataxia in the room.  PT and OT evaluations appreciated.  No need for inpatient rehab.  Her blood pressure has been stable and in normal range off antihypertensives.  It is possible that her initial hypotension was related to incipient influenza infection since there is not another good explanation.

## 2018-12-27 ENCOUNTER — Encounter (HOSPITAL_COMMUNITY): Payer: Self-pay | Admitting: Interventional Radiology

## 2018-12-27 ENCOUNTER — Ambulatory Visit: Payer: Medicare Other

## 2018-12-27 ENCOUNTER — Other Ambulatory Visit: Payer: Self-pay

## 2018-12-27 LAB — URINE CULTURE: Culture: <10000 — AB

## 2018-12-27 MED ORDER — OSELTAMIVIR PHOSPHATE 75 MG PO CAPS: 75.0000 mg | ORAL_CAPSULE | Freq: Two times a day (BID) | ORAL | 0 refills | Status: DC

## 2018-12-27 MED ORDER — TICAGRELOR 90 MG PO TABS: 90.0000 mg | ORAL_TABLET | Freq: Two times a day (BID) | ORAL | 0 refills | Status: DC

## 2018-12-27 NOTE — Plan of Care (Signed)
Adequate for discharge.

## 2018-12-27 NOTE — Discharge Instructions (Signed)
Shannon [name], Shannon [name] were hospitalized due to a transient ischemic attack that occurred secondary to your low blood pressure and vessel stenosis. Your low blood pressure was most likely due to your flu. Neurology interventional radiology has planned your stent procedure for January 23rd outpatient. Please call them to confirm the details of your appointment.   I want you to continue to take the following medications: - Take Aspirin 81 mg daily - Take Brillinta 90 mg twice a day  - Take Tamiflu 75 mg twice a day, for four more days. Take one tablet tonight and then continue to take 1 tablet twice a day for 3 days   I also want you to stop taking Plavix 75 mg daily and Lisinopril 10 mg. Please plan to follow up with your PCP in one week and neurology in 4 weeks.  Thank you for allowing [place] to be a part of your care!

## 2018-12-27 NOTE — Progress Notes (Signed)
Occupational Therapy Treatment Patient Details Name: Shannon [name] MRN: [id number] DOB: May 13, 1946 Today's Date: 12/27/2018    History of present illness Pt is a 73 y/o F who presented with increased weakness, dizziness and profuse sweating.  Recently patient was discharge s/p infarct in R basal ganglia and acute lateral lenticulostriate infarct on the R with R M1 segment stenosis, small remote frontal cortex infarct.    OT comments  Session focused on theraputty exercises and dynamic standing balance during toileting tasks. Handout provided re theraputty exercises and demonstration provided.  (S) provided during functional mobility. D/c plan remains appropriate, no OT follow up provided.    Follow Up Recommendations  No OT follow up    Equipment Recommendations  None recommended by OT    Recommendations for Other Services PT consult;Speech consult    Precautions / Restrictions Precautions Precautions: Fall Restrictions Weight Bearing Restrictions: No       Mobility Bed Mobility Overal bed mobility: Modified Independent                Transfers Overall transfer level: Needs assistance Equipment used: None Transfers: Stand Pivot Transfers;Sit to/from Stand Sit to Stand: Supervision Stand pivot transfers: Supervision       General transfer comment: No LOB, slight dynamic balance deficits, pt guarded during mobility     Balance Overall balance assessment: Mild deficits observed, not formally tested                                         ADL either performed or assessed with clinical judgement   ADL Overall ADL's : Needs assistance/impaired                         Toilet Transfer: Supervision/safety;Comfort height toilet   Toileting- Clothing Manipulation and Hygiene: Supervision/safety;Sit to/from stand       Functional mobility during ADLs: Supervision/safety General ADL Comments: Pt still c/o slight coordination/strength  deficits in her L UE, especially the hand. Pt provided with theraputty and handout detailing exercises.                Cognition Arousal/Alertness: Awake/alert Behavior During Therapy: WFL for tasks assessed/performed Overall Cognitive Status: Within Functional Limits for tasks assessed                                          Exercises Exercises: Hand exercises      General Comments Pt;s daughter present throughout session    Pertinent Vitals/ Pain       Pain Assessment: No/denies pain  Home Living   Prior Functioning/Environment    Frequency  Min 2X/week        Progress Toward Goals  OT Goals(current goals can now be found in the care plan section)  Progress towards OT goals: Progressing toward goals  Acute Rehab OT Goals Patient Stated Goal: To get back to normal daily function. OT Goal Formulation: With patient/family Time For Goal Achievement: 01/02/19 Potential to Achieve Goals: Good  Plan Discharge plan remains appropriate       AM-PAC OT "6 Clicks" Daily Activity     Outcome Measure   Help from another person eating meals?: None Help from another person taking care of personal grooming?: None Help from another person toileting, which includes  using toliet, bedpan, or urinal?: A Little Help from another person bathing (including washing, rinsing, drying)?: A Little Help from another person to put on and taking off regular upper body clothing?: None Help from another person to put on and taking off regular lower body clothing?: A Little 6 Click Score: 21    End of Session    OT Visit Diagnosis: Unsteadiness on feet (R26.81);Muscle weakness (generalized) (M62.81)   Activity Tolerance Patient tolerated treatment well   Patient Left in chair;with call bell/phone within reach;with family/visitor present   Nurse Communication Mobility status        Time: 5789-7847 OT Time Calculation (min): 20 min  Charges: OT General  Charges $OT Visit: 1 Visit OT Treatments $Therapeutic Activity: 8-22 mins   Curtis Sites OTR/L  12/27/2018, 12:01 PM

## 2018-12-27 NOTE — Progress Notes (Signed)
Patient D/C home with self care, transport via family, Iv removed

## 2018-12-27 NOTE — Progress Notes (Addendum)
   Subjective: Shannon [name] and her family report that they feel confused by the treatment plan because there have been so many changes recently. The plan was discussed thoroughly with them and they expressed understanding. Her cough has improved.   Objective:  Vital signs in last 24 hours: Vitals:   12/26/18 1538 12/26/18 2020 12/27/18 0008 12/27/18 0417  BP: (!) 143/53 (!) 116/59 130/68 137/76  Pulse: 88 80 96 78  Resp: 18     Temp: 100.2 F (37.9 C)  98.2 F (36.8 C) 99 F (37.2 C)  TempSrc: Oral  Oral Oral  SpO2: 98% 97% 97% 97%  Weight:       Gen: laying comfortably in bed, no distress Neuro: PERRL, strength 5/5 in all 4 extremities Pulm: CTAB, normal effort Ext: No edema  Assessment/Plan:  Principal Problem:   TIA (transient ischemic attack) Active Problems:   CVA (cerebral vascular accident) ([place])   Hypothyroidism   Essential hypertension   Influenza B  Shannon [name] is a 73 y.o female withthyroid disease, HTN anda recent stroke in 12/19 with severe right M1 stenosis who presented to the EDafter being found unresponsive and hypotensive. With IVF her blood pressure improved and her symptoms resolved. Neurology was consulted and she was subsequently admitted for further evaluation and management of her severe right M1 stenosis.   TIA Recent CVA Right M1 Stenosis -Plan for right MCA angioplasty versus stenting on 1/23 outpatient -Continue aspirin 81 mg and Brilinta 90 mg twice daily -Discontinue lisinopril -Continue outpatient PT  -TEE and placement of loop recorder canceled -EP recommends 30-day monitor at discharge  HTN -127/57, goal 130-150 due to right M1 high grade stenosis - Holding home Lisinopril  - Allowing permissive HTN  Fever Cough -Influenza panel positive for influenza B -Patient started on Tamiflu for 5 days -Afebrile, cough improving today - BC NGTD  Thyroid disease -Continue levothyroxine50 mcg MWF and75  mcgT/Th/S/Su   Dispo: Patient is medically stable for discharge today.   Mike Craze, DO 12/27/2018, 6:47 AM Pager: [phone]

## 2018-12-27 NOTE — Care Management Note (Signed)
Case Management Note  Patient Details  Name: Shannon [name] MRN: [id number] Date of Birth: [birth year]  Subjective/Objective:                    Action/Plan: Pt discharging home with self care. Pt was going to [facility] for outpatient therapy and she would like to continue with them. Pt states they were to begin outpt OT also. Orders in epic and information on the AVS.  Pt has transportation home.   Expected Discharge Date:  12/26/18               Expected Discharge Plan:  OP Rehab  In-House Referral:     Discharge planning Services  CM Consult  Post Acute Care Choice:    Choice offered to:     DME Arranged:    DME Agency:     HH Arranged:    HH Agency:     Status of Service:  Completed, signed off  If discussed at [employer] of Stay Meetings, dates discussed:    Additional Comments:  Pollie Friar, RN 12/27/2018, 10:37 AM

## 2018-12-27 NOTE — Discharge Summary (Signed)
Name: Shannon [name] MRN: [id number] DOB: Sep 30, 1946 73 y.o. PCP: Donalynn Furlong, MD  Date of Admission: 12/24/2018  7:43 AM Date of Discharge: 12/27/2018 Attending Physician: Annia Belt, MD  Discharge Diagnosis: 1.  TIA 2.  Right M1 stenosis 3.  Influenza B  Discharge Medications: Allergies as of 12/27/2018      Reactions   Other Palpitations   SINUTAB   Sulfa Antibiotics Other (See Comments)   Tongue turned black   Mandol [cefamandole] Rash      Medication List    STOP taking these medications   clopidogrel 75 MG tablet Commonly known as:  PLAVIX   lisinopril 10 MG tablet Commonly known as:  PRINIVIL,ZESTRIL     TAKE these medications   aspirin 81 MG EC tablet Take 1 tablet (81 mg total) by mouth daily.   atorvastatin 40 MG tablet Commonly known as:  LIPITOR Take 1 tablet (40 mg total) by mouth daily at 6 PM.   calcium carbonate 600 MG tablet Commonly known as:  OS-CAL Take 1 tablet by mouth daily.   clonazePAM 0.5 MG tablet Commonly known as:  KLONOPIN Take 1 tablet by mouth daily as needed.   co-enzyme Q-10 30 MG capsule Take 30 mg by mouth daily.   famotidine-calcium carbonate-magnesium hydroxide 10-800-165 MG chewable tablet Commonly known as:  PEPCID COMPLETE Chew 1 tablet by mouth daily as needed.   GAVISCON PO Take 5 mLs by mouth daily as needed.   levothyroxine 75 MCG tablet Commonly known as:  SYNTHROID, LEVOTHROID Take 1 tablet by mouth daily.   levothyroxine 50 MCG tablet Commonly known as:  SYNTHROID, LEVOTHROID Take 1 tablet by mouth daily.   multivitamin capsule Take 1 capsule by mouth daily.   oseltamivir 75 MG capsule Commonly known as:  TAMIFLU Take 1 capsule (75 mg total) by mouth 2 (two) times daily. Take one 75 mg capsule tonight and then continue to take 75 mg twice a day for 3 days   ticagrelor 90 MG Tabs tablet Commonly known as:  BRILINTA Take 1 tablet (90 mg total) by mouth 2 (two) times daily.   vitamin  E 400 UNIT capsule Take 400 Units by mouth every other day.       Disposition and follow-up:   Shannon [name] was discharged from [facility] in Stable condition.  At the hospital follow up visit please address:  1.  Hypertension- patient's home Toprol 10 mg was held allowing permissive hypertension in the setting of right M1 high-grade stenosis.  Please check blood pressure and assess blood pressure medications.  2.  Labs / imaging needed at time of follow-up: none  3.  Pending labs/ test needing follow-up: None  Follow-up Appointments: Follow-up Information    Garvin Fila, MD. Schedule an appointment as soon as possible for a visit in 4 week(s).   Specialties:  Neurology, Radiology Contact information: [address] [place] [address] [phone]        Luanne Bras, MD. Go on 01/04/2019.   Specialties:  Interventional Radiology, Radiology Why:  Call to confirm details Contact information: [place] [address] [phone]           Hospital Course by problem list: 1.  TIA- Patient had a recent stroke 12/27 and presented with syncope, right gaze deviation and left sided weakness in the setting of hypotension. MRI at that time showed acute infarct of the right basal ganglia. She was discharged on dual antiplatelets.  Repeat CT head shows no acute abnormality, re-demonstrates subacute right basal ganglia infarction.  MRI without acute CVA or propagation of prior CVA.  Cerebral angiogram showed right M1 high-grade stenosis 90% 50% stenosis of left M1.  Neuro IR plans to do right M1 stent versus angioplasty on 01/04/2019.  She was discharged home on aspirin 81 mg and Brilinta 90 mg twice daily.  Discontinued home medication of lisinopril 10 mg.  Will need to be reassessed by her PCP.  2.  Right M1 stenosis-see above. 3.  Influenza B-patient presented with cough, diffuse sweating and hypotension.  During her admission  she had T-max fever 102.5.  Chest x-ray did not show any consolidation or infiltrate.  Influenza panel was positive for influenza B.  She was treated with Tamiflu and discharged to complete 5-day course.  Discharge Vitals:   BP 137/76 (BP Location: Left Arm)   Pulse 78   Temp 99 F (37.2 C) (Oral)   Resp 18   Wt 53 kg   SpO2 97%   BMI 19.44 kg/m   Pertinent Labs, Studies, and Procedures:    Dg Chest 2 View  Result Date: 12/26/2018 CLINICAL DATA:  Fever, cough for several days EXAM: CHEST - 2 VIEW COMPARISON:  Portable chest x-ray of 12/24/2018 FINDINGS: No active infiltrate or effusion is seen. A calcified granuloma is again noted at the right lung base. Mediastinal and hilar contours are unremarkable and the heart is within upper limits of normal in size. No bony abnormality is seen. IMPRESSION: No active lung disease.  Calcified granuloma at the right lung base. Electronically Signed   By: Ivar Drape M.D.   On: 12/26/2018 10:16   Ct Cervical Spine Wo Contrast  Result Date: 12/24/2018 CLINICAL DATA:  Episode of left-sided weakness and right-sided gaze. Fall. EXAM: CT CERVICAL SPINE WITHOUT CONTRAST TECHNIQUE: Multidetector CT imaging of the cervical spine was performed without intravenous contrast. Multiplanar CT image reconstructions were also generated. COMPARISON:  None. FINDINGS: Alignment: AP alignment is anatomic. There straightening of the normal cervical lordosis. The patient is an a hard collar. Skull base and vertebrae: Craniocervical junction is normal. Vertebral body heights are maintained. No acute fracture is present. Soft tissues and spinal canal: Thyroid is markedly atrophic or surgically absent. No adenopathy is present. Salivary glands are within normal limits. Vascular calcifications are noted at the left carotid bifurcation, at the origin of the right vertebral artery, and at the innominate artery. Disc levels: Uncovertebral disease results in moderate right foraminal  narrowing at C5-6 and mild bilateral foraminal narrowing at C6-7. Upper chest: The lung apices are clear. Thoracic inlet is within normal limits. IMPRESSION: 1. No acute trauma. 2. Atherosclerosis. 3. Degenerative changes of the cervical spine are most evident at C5-6 on the right and bilaterally at C6-7. 4. Straightening of the normal cervical lordosis. This may be related to positioning as the patient is an a hard collar. Electronically Signed   By: San Morelle M.D.   On: 12/24/2018 08:14   Mr Brain Wo Contrast  Result Date: 12/24/2018 CLINICAL DATA:  Focal neuro deficit. New onset left-sided weakness and right-sided gaze. Fall. Recent right basal ganglia infarct. EXAM: MRI HEAD WITHOUT CONTRAST TECHNIQUE: Multiplanar, multiecho pulse sequences of the brain and surrounding structures were obtained without intravenous contrast. COMPARISON:  CT head without contrast 12/24/2018. MRI brain 12/08/2018. FINDINGS: Brain: The superior aspect of the previous infarct demonstrates some restricted diffusion signal. There is no significant restricted diffusion on the ADC map. No new  areas of infarction are evident. T2 signal changes are associated with the recent infarct. Periventricular Vasco matter changes are otherwise stable. The remote infarct of the left caudate head and lentiform nucleus is again seen. A remote lacunar infarct is again noted in the left thalamus. No hemorrhage or mass lesion is present. The ventricles are of normal size. No significant extraaxial fluid collection is present. The internal auditory canals are within normal limits. The brainstem and cerebellum are within normal limits. Vascular: Flow is present in the major intracranial arteries. Skull and upper cervical spine: The craniocervical junction is normal. Upper cervical spine is within normal limits. Marrow signal is unremarkable. Sinuses/Orbits: The right maxillary sinus is shrunken without active disease. Minimal mucosal  thickening is present in the ethmoid air cells. Mastoid air cells are clear. The globes and orbits are within normal limits. IMPRESSION: 1. Evolving previous infarct with some residual diffusion signal changes in the right centrum semi ovale. There is no significant restricted diffusion. 2. No new infarcts. 3. Stable remote infarcts involving the left caudate, left lentiform nucleus, and left thalamus. 4. 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 Electronically Signed   By: San Morelle M.D.   On: 12/24/2018 14:22   Dg Chest Portable 1 View  Result Date: 12/24/2018 CLINICAL DATA:  Possible stroke. EXAM: PORTABLE CHEST 1 VIEW COMPARISON:  None. FINDINGS: Monitoring leads overlie the patient. Normal cardiac and mediastinal contours. Calcified granuloma right lower lung. No pleural effusion or pneumothorax. Thoracic spine degenerative changes. IMPRESSION: No acute cardiopulmonary process. Electronically Signed   By: Lovey Newcomer M.D.   On: 12/24/2018 08:19   Ct Head Code Stroke Wo Contrast  Result Date: 12/24/2018 CLINICAL DATA:  Code stroke. New onset of left-sided weakness and right-sided gaze. Fall. EXAM: CT HEAD WITHOUT CONTRAST TECHNIQUE: Contiguous axial images were obtained from the base of the skull through the vertex without intravenous contrast. COMPARISON:  MRI brain 12/08/2018. FINDINGS: Brain: The subacute infarct involving the posterior right lentiform nucleus and centrum semi ovale is again noted. No new infarct is present. A remote lacunar infarct involving the left basal ganglia is stable. Periventricular Ell[place]anger matter disease is stable. Basal ganglia are otherwise intact. Internal capsule is otherwise normal. No focal cortical abnormalities are present. The ventricles are of normal size. No significant extraaxial fluid collection is present. The brainstem and cerebellum are within normal limits. Vascular: Atherosclerotic calcifications are present within  the cavernous internal carotid arteries. There is no hyperdense vessel. Skull: Calvarium is intact. No focal lytic or blastic lesions are present. Sinuses/Orbits: The right maxillary sinus is shrunken. There is no active disease. Paranasal sinuses are otherwise clear. Mastoid air cells are clear bilaterally. Globes and orbits are within normal limits. ASPECTS [facility] Stroke Program Early CT Score) - Ganglionic level infarction (caudate, lentiform nuclei, internal capsule, insula, M1-M3 cortex): 7/7 - Supraganglionic infarction (M4-M6 cortex): 3/3 Total score (0-10 with 10 being normal): 10/10 IMPRESSION: 1. No acute intracranial abnormality. 2. Recent infarct involving the posterior right lentiform nucleus and centrum semi ovale is again noted. There is no evidence for expansion of this infarct. 3. Atherosclerosis without hyperdense vessel. 4. ASPECTS is 10/10 The above was relayed via text pager to Dr. Lorraine Lax on 12/24/2018 at 08:02 . Electronically Signed   By: San Morelle M.D.   On: 12/24/2018 08:09    Discharge Instructions: Discharge Instructions    Ambulatory referral to Neurology   Complete by:  As directed    Follow up with  Dr. Leonie Man at [facility] in 4-6 weeks. Too complicated for RN to follow. Thanks.   Diet - low sodium heart healthy   Complete by:  As directed    Increase activity slowly   Complete by:  As directed      Shannon [name],  You were hospitalized due to a transient ischemic attack that occurred secondary to your low blood pressure and vessel stenosis. Your low blood pressure was most likely due to your flu. Neurology interventional radiology has planned your stent procedure for January 23rd outpatient. Please call them to confirm the details of your appointment.   I want you to continue to take the following medications: - Take Aspirin 81 mg daily - Take Brillinta 90 mg twice a day  - Take Tamiflu 75 mg twice a day, for four more days. Take one tablet tonight and then continue  to take 1 tablet twice a day for 3 days   I also want you to stop taking Plavix 75 mg daily and Lisinopril 10 mg. Please plan to follow up with your PCP in one week and neurology in 4 weeks.  Thank you for allowing [place] to be a part of your care!  Signed: Mike Craze, DO 12/27/2018, 10:18 AM

## 2018-12-29 ENCOUNTER — Other Ambulatory Visit: Payer: Self-pay | Admitting: *Deleted

## 2018-12-29 SURGERY — ECHOCARDIOGRAM, TRANSESOPHAGEAL
Anesthesia: Monitor Anesthesia Care

## 2018-12-29 NOTE — Patient Outreach (Signed)
[facility] [facility]) Care Management  12/29/2018  Kortni Hasten [birth year] [id number]   EMMI-stroke  RED ON EMMI ALERT Day # 1  Date: Thursday 12/28/2018 1301 Red Alert Reason: Scheduled a follow-up appointment? No  Insurance: Medicare and Blue [employer] and blue shield [facility] admissions x 2 ED visits x 2 in the last 6 months    Outreach attempt # 1 successful with her and a female family member  Patient is able to verify HIPAA [facility] Care Management RN reviewed and addressed red alert with patient  EMMI -All appointment made except PT and OT awaiting a return call for outpatient rehab services to start Does not need assistance from [facility] for appointments at this time after discussed assistance is available from [facility] SW   Transition of care assessment initiated   Social: Mrs Perris is married and has assist from her husband and female family member for all care and transportation to medical appointments   Conditions: CVA, HTN, TIA, hypothyroidism, influenza B    Medications: denies concerns with taking medications as prescribed, affording medications, side effects of medications and questions about medications    Appointments: all appointments scheduled except awaiting call from PT/OT    Advance Directives: Denies need for assist with advance directives She has a living will      Consent: [facility] RN CM reviewed [facility] services with patient. Patient gave verbal consent for services Telephonic [facility] RN CM.   Advised patient that there will be further automated EMMI- post discharge calls to assess how the patient is doing following the recent hospitalization Advised the patient that another call may be received from a nurse if any of their responses were abnormal. Patient voiced understanding and was appreciative of f/u call.   Plan: [facility] RN CM will close case at this time as patient has been assessed and no needs identified/needs resolved.   Pt encouraged to return a call to  [place] CM prn  [facility] RN CM sent a successful outreach letter as discussed with [facility] brochure enclosed for review     Joscelin Fray L. Lavina Hamman, RN, BSN, [place] Coordinator Office number [phone] Mobile number [phone]  Main [facility] number [phone] Fax number [phone]

## 2018-12-31 LAB — CULTURE, BLOOD (ROUTINE X 2)
Culture: NO GROWTH
Culture: NO GROWTH
Special Requests: ADEQUATE
Special Requests: ADEQUATE

## 2019-01-03 ENCOUNTER — Other Ambulatory Visit: Payer: Self-pay | Admitting: Radiology

## 2019-01-03 ENCOUNTER — Other Ambulatory Visit: Payer: Self-pay

## 2019-01-03 ENCOUNTER — Encounter (HOSPITAL_COMMUNITY): Payer: Self-pay | Admitting: *Deleted

## 2019-01-03 ENCOUNTER — Ambulatory Visit: Payer: Medicare Other

## 2019-01-03 ENCOUNTER — Other Ambulatory Visit: Payer: Self-pay | Admitting: *Deleted

## 2019-01-03 NOTE — Progress Notes (Signed)
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  History includes never smoker, HTN, CVA, cerebral artery stenosis, GERD. She reported "difficultly coming out of it [anesthesia]." Influenza B positive 12/26/18, s/p Tamiflu. Per PAT phone RN, symptoms have improved.    - Admitted 12/24/18-12/27/18 after having a possible syncope event. She fell to the floor at home. EMS noted that patient was hypotensive with right gaze deviation and left hemiplegia. No new findings on head CT. Neurology did not recommend repeat CVA work-up since just done in December. During hospitalization, she developed fever 102.5 and had respiratory symptoms on presentation and was found to be positive for influenza B. Fever resolved, but she was treated with 5 days of [place]. CXR did not show PNA. EP cardiology Rayann Heman, Jeneen Rinks, MD) was consulted to evaluate for loop recorder. Patient had recent febrile illness, so deferred loop recorder, but recommended 30 day out-patient event monitor and if results unrevealing could reconsider loop recorder at that time. She was discharged with diagnosis of TIIA.  - Admitted 12/08/18- 12/09/18 for acute ischemic CVA right basal ganglia and old infarct anterior limb left external capsule. MRI/MRA showed acute right lentiform nucleus infarct with high grade right M1 stenosis, remote left basal ganglia infarct, small remote right fontal cortex infarct, and moderate left supraclinoid ICA narrowin. She was started on ASA and Plavix and discharged with neurology follow-up.  Discussed recent flu history with anesthesiologist Renold Don, MD. If patient's symptoms have  resolved, then would not anticipate that would delay her procedure. Anesthesiologist to evaluate on the day of her procedure. She will have updated labs per IR.    PROVIDERS: Donalynn Furlong, MD is PCP Arta Silence, MD is cardiologist. Last visit 08/31/18 for HTN, SOB, fatigue, light-headedness. She had a reassuring ETT. (see [place]).   LABS: As of 12/26/18, labs results included: Lab Results  Component Value Date   WBC 2.8 (L) 12/26/2018   HGB 12.0 12/26/2018   HCT 36.0 12/26/2018   PLT 147 (L) 12/26/2018   GLUCOSE 96 12/26/2018   CHOL 199 12/09/2018   TRIG 110 12/09/2018   HDL 45 12/09/2018   LDLCALC 132 (H) 12/09/2018   ALT 21 12/24/2018   AST 26 12/24/2018   NA 136 12/26/2018   K 3.6 12/26/2018   CL 104 12/26/2018   CREATININE 0.71 12/26/2018   BUN 13 12/26/2018   CO2 25 12/26/2018   TSH 1.901 12/08/2018   INR 1.07 12/24/2018   HGBA1C 5.4 12/09/2018    IMAGES: IR cerebral angiography 12/25/18: IMPRESSION: - Severe high-grade stenosis of the right middle cerebral artery M1 segment distally. - Approximately 50% stenosis of the left internal carotid artery supraclinoid segment, the left middle cerebral artery M1 segment, and to a lesser degree the left anterior cerebral artery A1 segment. - Approximately 50% stenosis of the dominant right vertebral artery at its origin. PLAN: Findings were reviewed with patient, the family and also the referring MD. The patient and the family would like to proceed with endovascular revascularization of symptomatic right middle cerebral artery distally. This will be undertaken as soon  as possible. The procedure will be scheduled with anesthesia. Also the patient will be started on aspirin 81 mg a day, and Brilinta 90 mg b.i.d.  CXR 12/26/18: IMPRESSION: No active lung disease.  Calcified granuloma at the right lung base.  CT c-spine 12/24/18: IMPRESSION: 1. No acute trauma. 2. Atherosclerosis. 3. Degenerative  changes of the cervical spine are most evident at C5-6 on the right and bilaterally at C6-7. 4. Straightening of the normal cervical lordosis. This may be related to positioning as the patient is an a hard collar.  MRI/MRA brain 12/08/18: IMPRESSION: 1. 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 2. Remote left basal ganglia infarct. 3. Small remote right frontal cortex infarct. 4. Moderate left supraclinoid ICA narrowing.   EKG: 12/24/18: NSR   CV: Echo with bubble study 12/09/18: Study Conclusions: - Left ventricle: The cavity size was normal. Systolic function was   normal. The estimated ejection fraction was in the range of 60%   to 65%. Wall motion was normal; there were no regional wall   motion abnormalities. Left ventricular diastolic function   parameters were normal. - Left atrium: The atrium was normal in size. - Right ventricle: Systolic function was normal. - Atrial septum: No defect or patent foramen ovale was identified.   Echo contrast study showed no right-to-left atrial level shunt,   at baseline or with provocation. - Pulmonary arteries: Systolic pressure was within the normal   range.  ETT 09/04/18 ([place]):  Minor non-specific ST and T wave changes noted during stress or recovery  Overall, the patient's exercise capacity was excellent.  Negative ETT for ischemia at workload achieved  The patient experienced atypical chest pain during the stress test.  No significant arrhythmia noted Recommendations: Reassurance.  BP control important.  Carotid U/S 12/08/18: IMPRESSION: Minimal plaque in the carotid arteries. Estimated degree of stenosis in the internal carotid arteries is less than 50% bilaterally. Patent vertebral arteries with antegrade flow.   By 08/31/18 cardiology notes, [facility] 05/02/12 [facility]): Report not currently available, but according to 08/31/18 cardiology note it showed  "30% RCA stenosis." Will attempt to get full report.   Stress echocardiogram 05/01/12 ([place]): IMPRESSIONS  Normal resting echocardiogram.   Abnormal stress echocardiogram witha suggestion of inferior ischemia.   Exertional dyspnea with no CP.   Abnormal ECG suggestive of ischemia.   Results discussed with patient and husband.I have advised LHC and will schedule for tomorrow at [facility].   Past Medical History:  Diagnosis Date  . 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 Cancer ([place])    skin  . Complication of anesthesia    " I had difficulty coming out of it"  . Family history of adverse reaction to anesthesia    " My daughter has a problem coming ou to it"  . GERD (gastroesophageal reflux disease)   . Hypertension   . Stroke ([place])   . Thyroid disease     Past Surgical History:  Procedure Laterality Date  . ABDOMINAL HYSTERECTOMY    . APPENDECTOMY    . CARPAL TUNNEL RELEASE Bilateral   . COLONOSCOPY    . DILATION AND CURETTAGE OF UTERUS    . IR ANGIO INTRA EXTRACRAN SEL COM CAROTID INNOMINATE BILAT MOD SED  12/25/2018  . IR ANGIO VERTEBRAL SEL SUBCLAVIAN INNOMINATE UNI L MOD SED  12/25/2018  . IR ANGIO VERTEBRAL SEL VERTEBRAL UNI R MOD SED  12/25/2018  . IR  US GUIDE VASC ACCESS RIGHT  12/25/2018  . OVARIAN CYST REMOVAL      MEDICATIONS: No current facility-administered medications for this encounter.    . Alum Hydroxide-Mag Carbonate (GAVISCON PO)  . aspirin EC 81 MG EC tablet  . atorvastatin (LIPITOR) 40 MG tablet  . calcium carbonate (OS-CAL) 600 MG tablet  . clonazePAM (KLONOPIN) 0.5 MG tablet  . co-enzyme Q-10 30 MG capsule  . famotidine-calcium carbonate-magnesium hydroxide (PEPCID COMPLETE) 10-800-165 MG chewable tablet  . levothyroxine (SYNTHROID, LEVOTHROID) 50 MCG tablet  . levothyroxine (SYNTHROID, LEVOTHROID) 75 MCG tablet  . Multiple Vitamin (MULTIVITAMIN) capsule  . oseltamivir (TAMIFLU) 75 MG capsule  . ticagrelor (BRILINTA) 90 MG TABS  tablet  . vitamin E 400 UNIT capsule    Myra Gianotti, PA-C Surgical Short Stay/Anesthesiology [facility] Phone [phone] [facility] Phone [phone] 01/03/2019 4:31 PM

## 2019-01-03 NOTE — Patient Outreach (Signed)
[facility]) Care Management  01/03/2019  Shannon [name] 30-Nov-1946 [id number]   EMMI-stroke  RED ON EMMI ALERT Day # 6 Date: tuesday 01/02/2019 1000 Red Alert Reason:Questions/problems with meds? Yes  Insurance: Medicare and blue cross and blue shield supplement [facility] admissions x 2  ED visits x 2 in the last 6 months    Outreach attempt # 1 Initially Shannon [name] answered and inquired about the purpose for CM's call as another RN had "just called [place]"   Shannon [name] was able to confirm that the call prior to CM was from the Nurse follow up for her upcoming 01/04/2019 procedure with Shannon [name] Patient is able to verify HIPAA [place] Management RN reviewed and addressed red alert with patient Shannon [name] reports the answer was yes related to her having questions about use of her anticoagulant. She reports streaks and clots of blood with her nasal congestion Blood in nasal mucus has been noted Her daughter wanted Shannon [name] to speak with a nurse about the blood streaked to "dirty colored" mucus and questioned if she should continue her anticoagulant.  She has only had these episodes to occur once a day for the last 2 days  CM discussed continuing her anticoagulant unless she began to have frank continuous bleeding or a fever She denies a fever She was encouraged to call [facility] RN CM, 24 hour RN or MD office for continuous bleeding or fever CM encouraged her to keep the nasal passage moist with either spray normal saline or a moist cloth She and Shannon [name] voiced understanding   Social: Shannon [name] is married and has assist from her husband and female family member for all care and transportation to medical appointments   Conditions: CVA, HTN, TIA, hypothyroidism, influenza B    Medications: denies concerns with taking medications as prescribed, affording medications, side effects of medications and questions about medications    Appointments: being seen by PT/OT      Advance Directives: Denies need for assist with advance directives She has a living will      Consent: [facility] RN CM reviewed [facility] services with patient. Patient gave verbal consent for services Telephonic [facility] RN CM.   Advised patient that there will be further automated EMMI-post discharge calls to assess how the patient is doing following the recent hospitalization Advised the patient that another call may be received from a nurse if any of their responses were abnormal. Patient voiced understanding and was appreciative of f/u call.   Plan: [facility] RN CM will close case at this time as patient has been assessed and no needs identified/needs resolved.   Pt encouraged to return a call to [facility] RN CM prn- gave her Cm contact number again  [facility] RN CM sent a successful outreach letter as discussed with Shannon [name] brochure enclosed for review   Carlton Buskey L. Lavina Hamman, RN, BSN, [place] Coordinator Office number [phone] Mobile number [phone]  Main [facility] number [phone] Fax number [phone]

## 2019-01-03 NOTE — Anesthesia Preprocedure Evaluation (Addendum)
Anesthesia Evaluation  Patient identified by MRN, date of birth, ID band Patient awake    Reviewed: Allergy & Precautions, NPO status , Patient's Chart, lab work & pertinent test results  Airway Mallampati: III  TM Distance: >3 FB Neck ROM: Full    Dental no notable dental hx.    Pulmonary neg pulmonary ROS,    Pulmonary exam normal breath sounds clear to auscultation       Cardiovascular hypertension, Normal cardiovascular exam Rhythm:Regular Rate:Normal  Arta Silence, MD is cardiologist. Last visit 08/31/18 for HTN, SOB, fatigue, light-headedness. She had a reassuring ETT. (see [place]).   Neuro/Psych Left sided weakness TIACVA, Residual Symptoms negative psych ROS   GI/Hepatic Neg liver ROS, GERD  Controlled,  Endo/Other  Hypothyroidism   Renal/GU negative Renal ROS     Musculoskeletal negative musculoskeletal ROS (+)   Abdominal   Peds  Hematology HLD   Anesthesia Other Findings stenosis of artery  Reproductive/Obstetrics                           Anesthesia Physical Anesthesia Plan  ASA: III  Anesthesia Plan: General   Post-op Pain Management:    Induction: Intravenous  PONV Risk Score and Plan: 3 and Ondansetron, Dexamethasone and Treatment may vary due to age or medical condition  Airway Management Planned: Oral ETT  Additional Equipment: Arterial line  Intra-op Plan:   Post-operative Plan: Extubation in OR  Informed Consent: I have reviewed the patients History and Physical, chart, labs and discussed the procedure including the risks, benefits and alternatives for the proposed anesthesia with the patient or authorized representative who has indicated his/her understanding and acceptance.     Dental advisory given  Plan Discussed with: CRNA  Anesthesia Plan Comments: (Reviewed PAT note written 01/03/2019 by Myra Gianotti, PA-C. )      Anesthesia  Quick Evaluation

## 2019-01-03 NOTE — Progress Notes (Signed)
Pt denies SOB and chest pain. Pt stated that she see Dr. Benetta Spar for any cardiac issues. Pt stated that a stress test and cardiac cath were both performed by Dr. Benetta Spar; records requested. Pt made aware to stop taking vitamins, fish oil, CO Q 10 and herbal medications. Do not take any NSAIDs ie: Ibuprofen, Advil, Naproxen (Aleve), Motrin, BC and Goody Powder. Pt and spouse both stated that they were instructed to arrive at 0600 on DOS. Pt verbalized understanding of all pre-op instructions.

## 2019-01-04 ENCOUNTER — Inpatient Hospital Stay (HOSPITAL_COMMUNITY): Payer: Medicare Other

## 2019-01-04 ENCOUNTER — Encounter (HOSPITAL_COMMUNITY): Payer: Self-pay

## 2019-01-04 ENCOUNTER — Inpatient Hospital Stay (HOSPITAL_COMMUNITY): Payer: Medicare Other | Admitting: Certified Registered Nurse Anesthetist

## 2019-01-04 ENCOUNTER — Other Ambulatory Visit: Payer: Self-pay

## 2019-01-04 ENCOUNTER — Encounter (HOSPITAL_COMMUNITY)
Admission: RE | Disposition: A | Discharge status: Home / Self Care | Payer: Self-pay | Source: Home / Self Care | Attending: Interventional Radiology

## 2019-01-04 ENCOUNTER — Inpatient Hospital Stay (HOSPITAL_COMMUNITY)
Admission: RE | Admit: 2019-01-04 | Discharge: 2019-01-05 | DRG: 027 | Disposition: A | Discharge status: Home / Self Care | Payer: Medicare Other | Attending: Interventional Radiology | Admitting: Interventional Radiology

## 2019-01-04 ENCOUNTER — Ambulatory Visit (HOSPITAL_COMMUNITY): Admit: 2019-01-04 | Payer: Medicare Other

## 2019-01-04 DIAGNOSIS — E039 Hypothyroidism, unspecified: Secondary | ICD-10-CM | POA: Diagnosis present

## 2019-01-04 DIAGNOSIS — R29701 NIHSS score 1: Secondary | ICD-10-CM | POA: Diagnosis not present

## 2019-01-04 DIAGNOSIS — K219 Gastro-esophageal reflux disease without esophagitis: Secondary | ICD-10-CM | POA: Diagnosis present

## 2019-01-04 DIAGNOSIS — I771 Stricture of artery: Secondary | ICD-10-CM | POA: Diagnosis present

## 2019-01-04 DIAGNOSIS — Z8249 Family history of ischemic heart disease and other diseases of the circulatory system: Secondary | ICD-10-CM

## 2019-01-04 DIAGNOSIS — Z79899 Other long term (current) drug therapy: Secondary | ICD-10-CM | POA: Diagnosis not present

## 2019-01-04 DIAGNOSIS — Z79890 Hormone replacement therapy (postmenopausal): Secondary | ICD-10-CM | POA: Diagnosis not present

## 2019-01-04 DIAGNOSIS — Z7982 Long term (current) use of aspirin: Secondary | ICD-10-CM | POA: Diagnosis not present

## 2019-01-04 DIAGNOSIS — Z8673 Personal history of transient ischemic attack (TIA), and cerebral infarction without residual deficits: Secondary | ICD-10-CM | POA: Diagnosis not present

## 2019-01-04 DIAGNOSIS — Z882 Allergy status to sulfonamides status: Secondary | ICD-10-CM | POA: Diagnosis not present

## 2019-01-04 DIAGNOSIS — Z823 Family history of stroke: Secondary | ICD-10-CM | POA: Diagnosis not present

## 2019-01-04 DIAGNOSIS — I10 Essential (primary) hypertension: Secondary | ICD-10-CM | POA: Diagnosis present

## 2019-01-04 DIAGNOSIS — Z90710 Acquired absence of both cervix and uterus: Secondary | ICD-10-CM

## 2019-01-04 DIAGNOSIS — R29810 Facial weakness: Secondary | ICD-10-CM | POA: Diagnosis not present

## 2019-01-04 DIAGNOSIS — I6601 Occlusion and stenosis of right middle cerebral artery: Principal | ICD-10-CM | POA: Diagnosis present

## 2019-01-04 DIAGNOSIS — I639 Cerebral infarction, unspecified: Secondary | ICD-10-CM | POA: Diagnosis not present

## 2019-01-04 DIAGNOSIS — Z881 Allergy status to other antibiotic agents status: Secondary | ICD-10-CM | POA: Diagnosis not present

## 2019-01-04 HISTORY — DX: Other complications of anesthesia, initial encounter: T88.59XA

## 2019-01-04 HISTORY — DX: Adverse effect of unspecified anesthetic, initial encounter: T41.45XA

## 2019-01-04 HISTORY — DX: Malignant (primary) neoplasm, unspecified: C80.1

## 2019-01-04 HISTORY — PX: IR INTRA CRAN STENT: IMG2345

## 2019-01-04 HISTORY — PX: IR CT HEAD LTD: IMG2386

## 2019-01-04 HISTORY — PX: IR ANGIO VERTEBRAL SEL SUBCLAVIAN INNOMINATE UNI R MOD SED: IMG5365

## 2019-01-04 HISTORY — DX: Cerebral infarction, unspecified: I63.9

## 2019-01-04 HISTORY — DX: Gastro-esophageal reflux disease without esophagitis: K21.9

## 2019-01-04 HISTORY — DX: Stricture of artery: I77.1

## 2019-01-04 HISTORY — DX: Family history of other specified conditions: Z84.89

## 2019-01-04 HISTORY — PX: RADIOLOGY WITH ANESTHESIA: SHX6223

## 2019-01-04 LAB — BASIC METABOLIC PANEL
Anion gap: 10 (ref 5–15)
BUN: 14 mg/dL (ref 8–23)
CO2: 23 mmol/L (ref 22–32)
Calcium: 9.7 mg/dL (ref 8.9–10.3)
Chloride: 104 mmol/L (ref 98–111)
Creatinine, Ser: 0.77 mg/dL (ref 0.44–1.00)
GFR calc Af Amer: >60 mL/min (ref >60)
GFR calc non Af Amer: >60 mL/min (ref >60)
Glucose, Bld: 97 mg/dL (ref 70–99)
Potassium: 3.7 mmol/L (ref 3.5–5.1)
Sodium: 137 mmol/L (ref 135–145)

## 2019-01-04 LAB — CBC WITH DIFFERENTIAL/PLATELET
Abs Immature Granulocytes: 0.03 10*3/uL (ref 0.00–0.07)
Basophils Absolute: 0.1 10*3/uL (ref 0.0–0.1)
Basophils Relative: 1 %
Eosinophils Absolute: 0.1 10*3/uL (ref 0.0–0.5)
Eosinophils Relative: 2 %
HCT: 42.2 % (ref 36.0–46.0)
Hemoglobin: 13.9 g/dL (ref 12.0–15.0)
Immature Granulocytes: 1 %
Lymphocytes Relative: 31 %
Lymphs Abs: 1.5 10*3/uL (ref 0.7–4.0)
MCH: 30.3 pg (ref 26.0–34.0)
MCHC: 32.9 g/dL (ref 30.0–36.0)
MCV: 92.1 fL (ref 80.0–100.0)
Monocytes Absolute: 0.4 10*3/uL (ref 0.1–1.0)
Monocytes Relative: 9 %
Neutro Abs: 2.7 10*3/uL (ref 1.7–7.7)
Neutrophils Relative %: 56 %
Platelets: 288 10*3/uL (ref 150–400)
RBC: 4.58 MIL/uL (ref 3.87–5.11)
RDW: 11.3 % — ABNORMAL LOW (ref 11.5–15.5)
WBC: 4.7 10*3/uL (ref 4.0–10.5)
nRBC: 0 % (ref 0.0–0.2)

## 2019-01-04 LAB — TYPE AND SCREEN
ABO/RH(D): O POS
Antibody Screen: NEGATIVE

## 2019-01-04 LAB — BPAM PLATELET PHERESIS
Blood Product Expiration Date: 202001242359
ISSUE DATE / TIME: 202001231020
Unit Type and Rh: 6200

## 2019-01-04 LAB — POCT I-STAT 7, (LYTES, BLD GAS, ICA,H+H)
Acid-base deficit: 3 mmol/L — ABNORMAL HIGH (ref 0.0–2.0)
Bicarbonate: 21.7 mmol/L (ref 20.0–28.0)
Calcium, Ion: 1.13 mmol/L — ABNORMAL LOW (ref 1.15–1.40)
HCT: 25 % — ABNORMAL LOW (ref 36.0–46.0)
Hemoglobin: 8.5 g/dL — ABNORMAL LOW (ref 12.0–15.0)
O2 Saturation: 100 %
Potassium: 3.5 mmol/L (ref 3.5–5.1)
Sodium: 141 mmol/L (ref 135–145)
TCO2: 23 mmol/L (ref 22–32)
pCO2 arterial: 33.8 mmHg (ref 32.0–48.0)
pH, Arterial: 7.415 (ref 7.350–7.450)
pO2, Arterial: 314 mmHg — ABNORMAL HIGH (ref 83.0–108.0)

## 2019-01-04 LAB — ABO/RH: ABO/RH(D): O POS

## 2019-01-04 LAB — HEPARIN LEVEL (UNFRACTIONATED): Heparin Unfractionated: 0.26 IU/mL — ABNORMAL LOW (ref 0.30–0.70)

## 2019-01-04 LAB — GLUCOSE, CAPILLARY: Glucose-Capillary: 186 mg/dL — ABNORMAL HIGH (ref 70–99)

## 2019-01-04 LAB — PREPARE PLATELET PHERESIS: Unit division: 0

## 2019-01-04 LAB — POCT ACTIVATED CLOTTING TIME
Activated Clotting Time: 197 s
Activated Clotting Time: 202 seconds

## 2019-01-04 LAB — MRSA PCR SCREENING: MRSA by PCR: NEGATIVE

## 2019-01-04 LAB — PROTIME-INR
INR: 0.96
Prothrombin Time: 12.7 seconds (ref 11.4–15.2)

## 2019-01-04 LAB — PLATELET INHIBITION P2Y12: Platelet Function  P2Y12: 8 [PRU] — ABNORMAL LOW (ref 194–418)

## 2019-01-04 SURGERY — IR WITH ANESTHESIA
Anesthesia: General

## 2019-01-04 MED ORDER — ACETAMINOPHEN 650 MG RE SUPP: 650.0000 mg | RECTAL | Status: DC | PRN

## 2019-01-04 MED ORDER — SODIUM CHLORIDE 0.9 % IV SOLN: INTRAVENOUS | Status: DC

## 2019-01-04 MED ORDER — SODIUM CHLORIDE 0.9 % IV SOLN
INTRAVENOUS | Status: DC | PRN
  Administered 2019-01-04: 09:00:00 via INTRAVENOUS

## 2019-01-04 MED ORDER — NIMODIPINE 30 MG PO CAPS
0.0000 mg | ORAL_CAPSULE | ORAL | Status: AC
  Administered 2019-01-04: 30 mg via ORAL
  Filled 2019-01-04: qty 1

## 2019-01-04 MED ORDER — CLEVIDIPINE BUTYRATE 0.5 MG/ML IV EMUL
0.0000 mg/h | INTRAVENOUS | Status: DC
  Administered 2019-01-04 (×5): 21 mg/h via INTRAVENOUS
  Filled 2019-01-04 (×2): qty 100
  Filled 2019-01-04: qty 50

## 2019-01-04 MED ORDER — ALBUMIN HUMAN 5 % IV SOLN
INTRAVENOUS | Status: DC | PRN
  Administered 2019-01-04: 10:00:00 via INTRAVENOUS

## 2019-01-04 MED ORDER — VANCOMYCIN HCL IN DEXTROSE 1-5 GM/200ML-% IV SOLN
INTRAVENOUS | Status: AC
  Filled 2019-01-04: qty 200

## 2019-01-04 MED ORDER — LACTATED RINGERS IV SOLN: INTRAVENOUS | Status: DC

## 2019-01-04 MED ORDER — ROCURONIUM BROMIDE 10 MG/ML (PF) SYRINGE
PREFILLED_SYRINGE | INTRAVENOUS | Status: DC | PRN
  Administered 2019-01-04: 50 mg via INTRAVENOUS
  Administered 2019-01-04: 10 mg via INTRAVENOUS

## 2019-01-04 MED ORDER — HYPROMELLOSE (GONIOSCOPIC) 2.5 % OP SOLN: 1.0000 [drp] | OPHTHALMIC | Status: DC | PRN

## 2019-01-04 MED ORDER — TICAGRELOR 90 MG PO TABS: 90.0000 mg | ORAL_TABLET | Freq: Two times a day (BID) | ORAL | Status: DC

## 2019-01-04 MED ORDER — CLEVIDIPINE BUTYRATE 0.5 MG/ML IV EMUL
INTRAVENOUS | Status: DC | PRN
  Administered 2019-01-04: 1 mg/h via INTRAVENOUS

## 2019-01-04 MED ORDER — SUGAMMADEX SODIUM 200 MG/2ML IV SOLN
INTRAVENOUS | Status: DC | PRN
  Administered 2019-01-04: 110 mg via INTRAVENOUS

## 2019-01-04 MED ORDER — PHENYLEPHRINE 40 MCG/ML (10ML) SYRINGE FOR IV PUSH (FOR BLOOD PRESSURE SUPPORT)
PREFILLED_SYRINGE | INTRAVENOUS | Status: DC | PRN
  Administered 2019-01-04: 160 ug via INTRAVENOUS
  Administered 2019-01-04: 80 ug via INTRAVENOUS

## 2019-01-04 MED ORDER — HYDRALAZINE HCL 20 MG/ML IJ SOLN
5.0000 mg | Freq: Once | INTRAMUSCULAR | Status: AC
  Administered 2019-01-04: 5 mg via INTRAVENOUS
  Filled 2019-01-04: qty 1

## 2019-01-04 MED ORDER — SODIUM CHLORIDE 0.9 % IV SOLN
INTRAVENOUS | Status: DC | PRN
  Administered 2019-01-04: 25 ug/min via INTRAVENOUS

## 2019-01-04 MED ORDER — ASPIRIN 81 MG PO CHEW: 81.0000 mg | CHEWABLE_TABLET | Freq: Every day | ORAL | Status: DC

## 2019-01-04 MED ORDER — CLEVIDIPINE BUTYRATE 0.5 MG/ML IV EMUL
INTRAVENOUS | Status: AC
  Filled 2019-01-04: qty 50

## 2019-01-04 MED ORDER — SODIUM CHLORIDE 0.9% IV SOLUTION: Freq: Once | INTRAVENOUS | Status: DC

## 2019-01-04 MED ORDER — PROPOFOL 10 MG/ML IV BOLUS
INTRAVENOUS | Status: DC | PRN
  Administered 2019-01-04: 200 mg via INTRAVENOUS

## 2019-01-04 MED ORDER — ONDANSETRON HCL 4 MG/2ML IJ SOLN
4.0000 mg | Freq: Once | INTRAMUSCULAR | Status: AC | PRN
  Administered 2019-01-04: 4 mg via INTRAVENOUS

## 2019-01-04 MED ORDER — DEXAMETHASONE SODIUM PHOSPHATE 10 MG/ML IJ SOLN
INTRAMUSCULAR | Status: DC | PRN
  Administered 2019-01-04: 10 mg via INTRAVENOUS

## 2019-01-04 MED ORDER — HEPARIN SODIUM (PORCINE) 1000 UNIT/ML IJ SOLN
INTRAMUSCULAR | Status: DC | PRN
  Administered 2019-01-04: 500 [IU] via INTRAVENOUS
  Administered 2019-01-04: 3000 [IU] via INTRAVENOUS

## 2019-01-04 MED ORDER — ONDANSETRON HCL 4 MG/2ML IJ SOLN
INTRAMUSCULAR | Status: AC
  Filled 2019-01-04: qty 2

## 2019-01-04 MED ORDER — IOPAMIDOL (ISOVUE-300) INJECTION 61%
INTRAVENOUS | Status: AC
  Administered 2019-01-04: 20 mL
  Filled 2019-01-04: qty 50

## 2019-01-04 MED ORDER — POLYVINYL ALCOHOL 1.4 % OP SOLN
1.0000 [drp] | OPHTHALMIC | Status: DC | PRN
  Administered 2019-01-04: 1 [drp] via OPHTHALMIC
  Filled 2019-01-04: qty 15

## 2019-01-04 MED ORDER — HEPARIN (PORCINE) 25000 UT/250ML-% IV SOLN: 450.0000 [IU]/h | INTRAVENOUS | Status: AC

## 2019-01-04 MED ORDER — VANCOMYCIN HCL 1000 MG IV SOLR
1000.0000 mg | INTRAVENOUS | Status: AC
  Administered 2019-01-04: 1000 mg via INTRAVENOUS

## 2019-01-04 MED ORDER — LIDOCAINE 2% (20 MG/ML) 5 ML SYRINGE
INTRAMUSCULAR | Status: DC | PRN
  Administered 2019-01-04: 60 mg via INTRAVENOUS

## 2019-01-04 MED ORDER — HYDRALAZINE HCL 20 MG/ML IJ SOLN
INTRAMUSCULAR | Status: AC
  Filled 2019-01-04: qty 1

## 2019-01-04 MED ORDER — ONDANSETRON HCL 4 MG/2ML IJ SOLN
INTRAMUSCULAR | Status: DC | PRN
  Administered 2019-01-04: 4 mg via INTRAVENOUS

## 2019-01-04 MED ORDER — HEPARIN (PORCINE) 25000 UT/250ML-% IV SOLN
500.0000 [IU]/h | INTRAVENOUS | Status: DC
  Administered 2019-01-04: 500 [IU]/h via INTRAVENOUS
  Filled 2019-01-04: qty 250

## 2019-01-04 MED ORDER — ASPIRIN 81 MG PO CHEW
81.0000 mg | CHEWABLE_TABLET | Freq: Every day | ORAL | Status: DC
  Administered 2019-01-05: 81 mg via ORAL
  Filled 2019-01-04: qty 1

## 2019-01-04 MED ORDER — ASPIRIN EC 325 MG PO TBEC: 325.0000 mg | DELAYED_RELEASE_TABLET | ORAL | Status: DC

## 2019-01-04 MED ORDER — CLEVIDIPINE BUTYRATE 0.5 MG/ML IV EMUL
0.0000 mg/h | INTRAVENOUS | Status: DC
  Administered 2019-01-04: 21 mg/h via INTRAVENOUS
  Administered 2019-01-04: 20 mg/h via INTRAVENOUS
  Administered 2019-01-04: 17 mg/h via INTRAVENOUS
  Administered 2019-01-05: 12 mg/h via INTRAVENOUS
  Filled 2019-01-04 (×3): qty 50

## 2019-01-04 MED ORDER — FENTANYL CITRATE (PF) 100 MCG/2ML IJ SOLN
INTRAMUSCULAR | Status: DC | PRN
  Administered 2019-01-04: 100 ug via INTRAVENOUS

## 2019-01-04 MED ORDER — KETOROLAC TROMETHAMINE 0.5 % OP SOLN
1.0000 [drp] | Freq: Three times a day (TID) | OPHTHALMIC | Status: AC | PRN
  Administered 2019-01-04: 1 [drp] via OPHTHALMIC
  Filled 2019-01-04: qty 5

## 2019-01-04 MED ORDER — TICAGRELOR 90 MG PO TABS
90.0000 mg | ORAL_TABLET | Freq: Two times a day (BID) | ORAL | Status: DC
  Administered 2019-01-05: 90 mg via ORAL
  Filled 2019-01-04: qty 1

## 2019-01-04 MED ORDER — HYDRALAZINE HCL 20 MG/ML IJ SOLN
5.0000 mg | Freq: Once | INTRAMUSCULAR | Status: AC
  Administered 2019-01-04: 5 mg via INTRAVENOUS

## 2019-01-04 MED ORDER — HEPARIN (PORCINE) 25000 UT/250ML-% IV SOLN
INTRAVENOUS | Status: AC
  Filled 2019-01-04: qty 250

## 2019-01-04 MED ORDER — HEPARIN (PORCINE) 25000 UT/250ML-% IV SOLN: 450.0000 [IU]/h | INTRAVENOUS | Status: DC

## 2019-01-04 MED ORDER — ACETAMINOPHEN 160 MG/5ML PO SOLN: 650.0000 mg | ORAL | Status: DC | PRN

## 2019-01-04 MED ORDER — ASPIRIN 81 MG PO TBEC: 81.0000 mg | DELAYED_RELEASE_TABLET | Freq: Every day | ORAL | Status: DC

## 2019-01-04 MED ORDER — EPTIFIBATIDE 20 MG/10ML IV SOLN
INTRAVENOUS | Status: AC
  Filled 2019-01-04: qty 10

## 2019-01-04 MED ORDER — GLYCOPYRROLATE 0.2 MG/ML IJ SOLN
INTRAMUSCULAR | Status: DC | PRN
  Administered 2019-01-04: 0.2 mg via INTRAVENOUS

## 2019-01-04 MED ORDER — ACETAMINOPHEN 325 MG PO TABS: 650.0000 mg | ORAL_TABLET | ORAL | Status: DC | PRN

## 2019-01-04 MED ORDER — IOHEXOL 300 MG/ML  SOLN
150.0000 mL | Freq: Once | INTRAMUSCULAR | Status: AC | PRN
  Administered 2019-01-04: 100 mL via INTRA_ARTERIAL

## 2019-01-04 MED ORDER — FENTANYL CITRATE (PF) 100 MCG/2ML IJ SOLN: 25.0000 ug | INTRAMUSCULAR | Status: DC | PRN

## 2019-01-04 MED ORDER — NITROGLYCERIN 1 MG/10 ML FOR IR/CATH LAB
INTRA_ARTERIAL | Status: AC
  Filled 2019-01-04: qty 10

## 2019-01-04 MED ORDER — SODIUM CHLORIDE 0.9 % IV SOLN
INTRAVENOUS | Status: AC
  Administered 2019-01-04 (×2): via INTRAVENOUS

## 2019-01-04 NOTE — Progress Notes (Signed)
Dr. Estanislado Pandy to proceed with surgery.  Instructed to give 30mg  Nimotop, per Dr. Estanislado Pandy, verbal order.  Hold ASA, verbal order.

## 2019-01-04 NOTE — Progress Notes (Signed)
INR. POst procedure .Extubated Maintaining O2 Sats. Denies any H/As,N/V . C/O  A sore throat. Puplis 26mm RT = Lt. No facial asymmetry. Moves alll 4s ,with MIld Lt UE pronation drift. RT groin soft. Distal pulses RT PT and Lt DP palpaable . Lt PT and RT DP  Dopplerable. S.Josanne Boerema MD

## 2019-01-04 NOTE — Progress Notes (Signed)
  Patient seen on post procedure rounds.  Slow to wake up, which is usual for her per family.  Good ROM left leg (right leg remains straight)  Right CFA stick ok, no bleeding, no hematoma.  C/o foreign body sensation in left eye - anesthesia aware and will evaluate.  Will order artificial tears.  Good grip strength bilaterally.  A & O x 3  Use cuff pressure instead of A-line.  Saline drip down to 25 mL/hr.  Sit up 30 degrees at 5 pm.  Clear liquids.  Shannon [name] S Schneur Crowson PA-C 01/04/2019 4:15 PM

## 2019-01-04 NOTE — Progress Notes (Signed)
P2Y12 - 8  Paged Shannon in IR, informed her of lab value.  Larene [place] stated she will let Dr. Estanislado Pandy know as soon as he gets here.

## 2019-01-04 NOTE — Progress Notes (Signed)
ANTICOAGULATION CONSULT NOTE - Initial Consult  Pharmacy Consult for Heparin Indication: s/p IR intervention  Allergies  Allergen Reactions  . Other Palpitations    SINUTAB  . Sulfa Antibiotics Other (See Comments)    Tongue turned black  . Mandol [Cefamandole] Rash    Patient Measurements: Height: 5\' 5"  (165.1 cm) Weight: 115 lb (52.2 kg) IBW/kg (Calculated) : 57 Heparin Dosing Weight:    Vital Signs: Temp: 97.3 F (36.3 C) (01/23 1239) Temp Source: Oral (01/23 0626) BP: 141/56 (01/23 1340) Pulse Rate: 97 (01/23 1345)  Labs: Recent Labs    01/04/19 0619 01/04/19 1136  HGB 13.9 8.5*  HCT 42.2 25.0*  PLT 288  --   LABPROT 12.7  --   INR 0.96  --   CREATININE 0.77  --     Estimated Creatinine Clearance: 52.4 mL/min (by C-G formula based on SCr of 0.77 mg/dL).   Medical History: Past Medical History:  Diagnosis Date  . 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 Cancer ([place])    skin  . Complication of anesthesia    " I had difficulty coming out of it"  . Family history of adverse reaction to anesthesia    " My daughter has a problem coming ou to it"  . GERD (gastroesophageal reflux disease)   . Hypertension   . Stroke ([place])   . Thyroid disease      Assessment: CC/HPI: 12/24/18 with right-sided gaze, left-sided weakness, and aphasia. underwent cerebral angiography which showed severe middle cerebral artery stenosis for intervention 1/23.  PMH: CVA 12/19, Cerebral artery stenosis, skin cancer, GERN, HTN, thyroid dz  Anticoag:  post-IR intervention for severe MCA stenosis. Hgb 13.9>8.5?  Goal of Therapy:  Heparin level 0.1-0.25 units/ml Monitor platelets by anticoagulation protocol: Yes   Plan:   Heparin 500 units/hr Check heparin level 6 hrs after heparin starts Heparin level and CBC x 1 in AM. Off 0800 1/24 for sheath pull   Graciella Arment S. Alford Highland, PharmD, BCPS Clinical Staff Pharmacist Eilene Ghazi [place] 01/04/2019,1:52 PM

## 2019-01-04 NOTE — Progress Notes (Signed)
Patient transferred to 4N22, patient transferred from stretcher to ICU bed, several RNs at bedside, receiving RN unavailable, VS stable, patient able to move all extremities, no questions or concerns, family notified of pts transfer via volunteers.  Rowe Pavy, RN

## 2019-01-04 NOTE — Progress Notes (Signed)
ANTICOAGULATION CONSULT NOTE - Follow Up Consult  Pharmacy Consult for Heparin Indication: s/p IR intervention  Allergies  Allergen Reactions  . Other Palpitations    SINUTAB  . Sulfa Antibiotics Other (See Comments)    Tongue turned black  . Mandol [Cefamandole] Rash    Patient Measurements: Height: 5\' 5"  (165.1 cm) Weight: 120 lb 9.5 oz (54.7 kg) IBW/kg (Calculated) : 57 Heparin Dosing Weight:    Vital Signs: Temp: 98.1 F (36.7 C) (01/23 1600) Temp Source: Axillary (01/23 1600) BP: 121/48 (01/23 2000) Pulse Rate: 106 (01/23 2000)  Labs: Recent Labs    01/04/19 0619 01/04/19 1136 01/04/19 1904  HGB 13.9 8.5*  --   HCT 42.2 25.0*  --   PLT 288  --   --   LABPROT 12.7  --   --   INR 0.96  --   --   HEPARINUNFRC  --   --  0.26*  CREATININE 0.77  --   --     Estimated Creatinine Clearance: 54.9 mL/min (by C-G formula based on SCr of 0.77 mg/dL).   Medical History: Past Medical History:  Diagnosis Date  . 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 Cancer ([place])    skin  . Complication of anesthesia    " I had difficulty coming out of it"  . Family history of adverse reaction to anesthesia    " My daughter has a problem coming ou to it"  . GERD (gastroesophageal reflux disease)   . Hypertension   . Stroke ([place])   . Thyroid disease      Assessment: CC/HPI: 12/24/18 with right-sided gaze, left-sided weakness, and aphasia. underwent cerebral angiography which showed severe middle cerebral artery stenosis for intervention 1/23.  Currently on IV heparin at 500 units/hr post IR. Initial heparin lvl is slightly above goal range at 0.26.   Goal of Therapy:  Heparin level 0.1-0.25 units/ml Monitor platelets by anticoagulation protocol: Yes   Plan:  Decrease heparin to 450 units/hr Turn off heparin infusion at 0800 1/24 for sheath pull   Albertina Parr, PharmD., BCPS Clinical Pharmacist Clinical phone for 01/04/19 until 10:30pm: [phone] If after 10:30pm,  please refer to [facility] for unit-specific pharmacist

## 2019-01-04 NOTE — Transfer of Care (Signed)
Immediate Anesthesia Transfer of Care Note  Patient: Shannon [name]  Procedure(s) Performed: RMCA angioplasty with possible stenting (N/A )  Patient Location: PACU  Anesthesia Type:General  Level of Consciousness: awake, alert  and oriented  Airway & Oxygen Therapy: Patient Spontanous Breathing and Patient connected to nasal cannula oxygen  Post-op Assessment: Report given to RN, Post -op Vital signs reviewed and stable, Patient moving all extremities X 4 and Patient able to stick tongue midline  Post vital signs: Reviewed and stable  Last Vitals:  Vitals Value Taken Time  BP    Temp    Pulse    Resp    SpO2      Last Pain:  Vitals:   01/04/19 0726  TempSrc:   PainSc: 0-No pain         Complications: No apparent anesthesia complications

## 2019-01-04 NOTE — Anesthesia Postprocedure Evaluation (Signed)
Anesthesia Post Note  Patient: Shannon [name]  Procedure(s) Performed: RMCA angioplasty with possible stenting (N/A )     Patient location during evaluation: ICU Anesthesia Type: General Level of consciousness: awake Pain management: pain level controlled Vital Signs Assessment: post-procedure vital signs reviewed and stable Respiratory status: spontaneous breathing, nonlabored ventilation, respiratory function stable and patient connected to nasal cannula oxygen Cardiovascular status: blood pressure returned to baseline and stable Postop Assessment: no apparent nausea or vomiting Anesthetic complications: yes Anesthetic complication details: injury of corneaComments: Patient complaining of left eye pain. Drop orders explained to RN, patient, and family. Potential need for outpatient ophthalmology evaluation discussed.      Last Vitals:  Vitals:   01/04/19 1530 01/04/19 1600  BP: (!) 140/51 (!) 137/53  Pulse: 95 100  Resp: (!) 22 (!) 24  Temp:  36.7 C  SpO2: 97% 98%    Last Pain:  Vitals:   01/04/19 1600  TempSrc: Axillary  PainSc: 5                  Ryan P Ellender

## 2019-01-04 NOTE — Anesthesia Procedure Notes (Signed)
Procedure Name: Intubation Date/Time: 01/04/2019 9:27 AM Performed by: Leonor Liv, CRNA Pre-anesthesia Checklist: Patient identified, Emergency Drugs available, Suction available and Patient being monitored Patient Re-evaluated:Patient Re-evaluated prior to induction Oxygen Delivery Method: Circle System Utilized Preoxygenation: Pre-oxygenation with 100% oxygen Induction Type: IV induction Ventilation: Mask ventilation without difficulty and Oral airway inserted - appropriate to patient size Laryngoscope Size: Mac and 3 Grade View: Grade II Tube type: Oral Tube size: 7.0 mm Number of attempts: 1 Airway Equipment and Method: Stylet and Oral airway Placement Confirmation: ETT inserted through vocal cords under direct vision,  positive ETCO2 and breath sounds checked- equal and bilateral Secured at: 20 cm Tube secured with: Tape (left) Dental Injury: Teeth and Oropharynx as per pre-operative assessment

## 2019-01-04 NOTE — H&P (Signed)
Chief Complaint: Middle cerebral artery stenosis  Referring Physician(s): Rosalin Hawking  Supervising Physician: Luanne Bras  Patient Status: [facility] - Out-pt  History of Present Illness: Shannon [name] is a 73 y.o. female with recent CVA in December.  She presented to the ED on 12/24/18 with right-sided gaze, left-sided weakness, and aphasia.  Code stroke was initiated.  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  She is here today for intervention.  She has been taking Brilinta and 325 aspirin.  She feels ok. She is at her baseline.  Past Medical History:  Diagnosis Date  . 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 Cancer ([place])    skin  . Complication of anesthesia    " I had difficulty coming out of it"  . Family history of adverse reaction to anesthesia    " My daughter has a problem coming ou to it"  . GERD (gastroesophageal reflux disease)   . Hypertension   . Stroke ([place])   . Thyroid disease     Past Surgical History:  Procedure Laterality Date  . ABDOMINAL HYSTERECTOMY    . APPENDECTOMY    . CARPAL TUNNEL RELEASE Bilateral   . COLONOSCOPY    . DILATION AND CURETTAGE OF UTERUS    . IR ANGIO INTRA EXTRACRAN SEL COM CAROTID INNOMINATE BILAT MOD SED  12/25/2018  . IR ANGIO VERTEBRAL SEL SUBCLAVIAN INNOMINATE UNI L MOD SED  12/25/2018  . IR ANGIO VERTEBRAL SEL VERTEBRAL UNI R MOD SED  12/25/2018  . IR US GUIDE VASC ACCESS RIGHT  12/25/2018  . OVARIAN CYST REMOVAL      Allergies: Other; Sulfa antibiotics; and Mandol [cefamandole]  Medications: Prior to Admission medications   Medication Sig Start Date End Date Taking? Authorizing Provider  aspirin EC 81 MG EC tablet Take 1 tablet (81 mg total) by mouth daily. 12/10/18  Yes Mayo, Pete Pelt, MD  atorvastatin (LIPITOR) 40 MG tablet Take 1 tablet (40 mg total) by mouth daily at 6 PM. 12/09/18  Yes Mayo, Pete Pelt, MD  calcium carbonate (OS-CAL) 600 MG tablet Take 1  tablet by mouth daily.   Yes [provider]  co-enzyme Q-10 30 MG capsule Take 30 mg by mouth daily.   Yes [provider]  famotidine-calcium carbonate-magnesium hydroxide (PEPCID COMPLETE) 10-800-165 MG chewable tablet Chew 1 tablet by mouth daily as needed.   Yes [provider]  levothyroxine (SYNTHROID, LEVOTHROID) 50 MCG tablet Take 1 tablet by mouth daily. 04/24/12  Yes [provider]  levothyroxine (SYNTHROID, LEVOTHROID) 75 MCG tablet Take 1 tablet by mouth daily. 04/24/12  Yes [provider]  Multiple Vitamin (MULTIVITAMIN) capsule Take 1 capsule by mouth daily. 04/24/12  Yes [provider]  oseltamivir (TAMIFLU) 75 MG capsule Take 1 capsule (75 mg total) by mouth 2 (two) times daily. Take one 75 mg capsule tonight and then continue to take 75 mg twice a day for 3 days 12/27/18  Yes Rehman, Areeg N, DO  ticagrelor (BRILINTA) 90 MG TABS tablet Take 1 tablet (90 mg total) by mouth 2 (two) times daily. 12/27/18  Yes Rehman, Areeg N, DO  vitamin E 400 UNIT capsule Take 400 Units by mouth every other day.    Yes [provider]  Alum Hydroxide-Mag Carbonate (GAVISCON PO) Take 5 mLs by mouth daily as needed.    [provider]  clonazePAM (KLONOPIN) 0.5 MG tablet Take 1 tablet by mouth daily as needed.  [provider]     Family History  Problem Relation Age of Onset  . CVA Mother   . Heart disease Mother   . Peripheral Artery Disease Father     Social History   Socioeconomic History  . Marital status: Married    Spouse name: Not on file  . Number of children: Not on file  . Years of education: Not on file  . Highest education level: Not on file  Occupational History  . Occupation: retired  Scientific laboratory technician  . Financial resource strain: Not on file  . Food insecurity:    Worry: Not on file    Inability: Not on file  . Transportation needs:    Medical: Not on file    Non-medical: Not on file    Tobacco Use  . Smoking status: Never Smoker  . Smokeless tobacco: Never Used  Substance and Sexual Activity  . Alcohol use: Never    Frequency: Never  . Drug use: Never  . Sexual activity: Yes  Lifestyle  . Physical activity:    Days per week: Not on file    Minutes per session: Not on file  . Stress: Not on file  Relationships  . Social connections:    Talks on phone: Not on file    Gets together: Not on file    Attends religious service: Not on file    Active member of club or organization: Not on file    Attends meetings of clubs or organizations: Not on file    Relationship status: Not on file  Other Topics Concern  . Not on file  Social History Narrative  . Not on file     Review of Systems: A 12 point ROS discussed and pertinent positives are indicated in the HPI above.  All other systems are negative.   Vital Signs: BP (!) 200/70   Pulse 68   Temp 98.3 F (36.8 C) (Oral)   Resp 18   Ht 5\' 5"  (1.651 m)   Wt 52.2 kg   SpO2 100%   BMI 19.14 kg/m   Physical Exam Vitals signs reviewed.  Constitutional:      Appearance: Normal appearance.  HENT:     Head: Normocephalic and atraumatic.  Eyes:     Extraocular Movements: Extraocular movements intact.  Neck:     Musculoskeletal: Normal range of motion.  Cardiovascular:     Rate and Rhythm: Normal rate and regular rhythm.  Pulmonary:     Effort: Pulmonary effort is normal.     Breath sounds: Normal breath sounds.  Abdominal:     General: There is no distension.     Palpations: Abdomen is soft.     Tenderness: There is no abdominal tenderness.  Musculoskeletal: Normal range of motion.  Skin:    General: Skin is warm and dry.  Neurological:     General: No focal deficit present.     Mental Status: She is alert and oriented to person, place, and time.  Psychiatric:        Mood and Affect: Mood normal.        Behavior: Behavior normal.        Thought Content: Thought content normal.        Judgment:  Judgment normal.     Imaging: Dg Chest 2 View  Result Date: 12/26/2018 CLINICAL DATA:  Fever, cough for several days EXAM: CHEST - 2 VIEW COMPARISON:  Portable chest x-ray of 12/24/2018 FINDINGS: No active infiltrate or effusion  is seen. A calcified granuloma is again noted at the right lung base. Mediastinal and hilar contours are unremarkable and the heart is within upper limits of normal in size. No bony abnormality is seen. IMPRESSION: No active lung disease.  Calcified granuloma at the right lung base. Electronically Signed   By: Ivar Drape M.D.   On: 12/26/2018 10:16   Ct Head Wo Contrast  Result Date: 12/08/2018 CLINICAL DATA:  Right-sided headache for 6 days. Fatigue. Right-sided mouth weakness EXAM: CT HEAD WITHOUT CONTRAST TECHNIQUE: Contiguous axial images were obtained from the base of the skull through the vertex without intravenous contrast. COMPARISON:  Head CT and brain MRI October 12, 2017 FINDINGS: Brain: The ventricles are normal in size and configuration. There is no intracranial mass, hemorrhage, extra-axial fluid collection, or midline shift. There is decreased attenuation involving much of the right lentiform nucleus as well as involving portions of the right internal and external capsule, concerning for acute right basal ganglia infarct. There is evidence of a prior infarct involving the anterior limb of the left external capsule, stable. Brain parenchyma otherwise appears unremarkable. Vascular: No hyperdense vessel. There is calcification in each carotid siphon region. Skull: The bony calvarium appears intact. Sinuses/Orbits: There is mild mucosal thickening in several ethmoid air cells. Other visualized paranasal sinuses are clear. Visualized orbits appear symmetric bilaterally. Other: Mastoid air cells are clear. IMPRESSION: Apparent acute infarct involving much of the right basal ganglia. Localized cytotoxic edema in this area noted. Prior infarct anterior limb left external  capsule. No mass or hemorrhage. There are foci of arterial vascular calcification. There is mild mucosal thickening in several ethmoid air cells. Electronically Signed   By: Lowella Grip III M.D.   On: 12/08/2018 09:42   Ct Cervical Spine Wo Contrast  Result Date: 12/24/2018 CLINICAL DATA:  Episode of left-sided weakness and right-sided gaze. Fall. EXAM: CT CERVICAL SPINE WITHOUT CONTRAST TECHNIQUE: Multidetector CT imaging of the cervical spine was performed without intravenous contrast. Multiplanar CT image reconstructions were also generated. COMPARISON:  None. FINDINGS: Alignment: AP alignment is anatomic. There straightening of the normal cervical lordosis. The patient is an a hard collar. Skull base and vertebrae: Craniocervical junction is normal. Vertebral body heights are maintained. No acute fracture is present. Soft tissues and spinal canal: Thyroid is markedly atrophic or surgically absent. No adenopathy is present. Salivary glands are within normal limits. Vascular calcifications are noted at the left carotid bifurcation, at the origin of the right vertebral artery, and at the innominate artery. Disc levels: Uncovertebral disease results in moderate right foraminal narrowing at C5-6 and mild bilateral foraminal narrowing at C6-7. Upper chest: The lung apices are clear. Thoracic inlet is within normal limits. IMPRESSION: 1. No acute trauma. 2. Atherosclerosis. 3. Degenerative changes of the cervical spine are most evident at C5-6 on the right and bilaterally at C6-7. 4. Straightening of the normal cervical lordosis. This may be related to positioning as the patient is an a hard collar. Electronically Signed   By: San Morelle M.D.   On: 12/24/2018 08:14   Mr Brain Wo Contrast  Result Date: 12/24/2018 CLINICAL DATA:  Focal neuro deficit. New onset left-sided weakness and right-sided gaze. Fall. Recent right basal ganglia infarct. EXAM: MRI HEAD WITHOUT CONTRAST TECHNIQUE: Multiplanar,  multiecho pulse sequences of the brain and surrounding structures were obtained without intravenous contrast. COMPARISON:  CT head without contrast 12/24/2018. MRI brain 12/08/2018. FINDINGS: Brain: The superior aspect of the previous infarct demonstrates some restricted diffusion signal.  There is no significant restricted diffusion on the ADC map. No new areas of infarction are evident. T2 signal changes are associated with the recent infarct. Periventricular Bryand matter changes are otherwise stable. The remote infarct of the left caudate head and lentiform nucleus is again seen. A remote lacunar infarct is again noted in the left thalamus. No hemorrhage or mass lesion is present. The ventricles are of normal size. No significant extraaxial fluid collection is present. The internal auditory canals are within normal limits. The brainstem and cerebellum are within normal limits. Vascular: Flow is present in the major intracranial arteries. Skull and upper cervical spine: The craniocervical junction is normal. Upper cervical spine is within normal limits. Marrow signal is unremarkable. Sinuses/Orbits: The right maxillary sinus is shrunken without active disease. Minimal mucosal thickening is present in the ethmoid air cells. Mastoid air cells are clear. The globes and orbits are within normal limits. IMPRESSION: 1. Evolving previous infarct with some residual diffusion signal changes in the right centrum semi ovale. There is no significant restricted diffusion. 2. No new infarcts. 3. Stable remote infarcts involving the left caudate, left lentiform nucleus, and left thalamus. 4. 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 Electronically Signed   By: San Morelle M.D.   On: 12/24/2018 14:22   Mr Brain Wo Contrast  Result Date: 12/08/2018 CLINICAL DATA:  Stroke follow-up EXAM: MRI HEAD WITHOUT CONTRAST MRA HEAD WITHOUT CONTRAST TECHNIQUE: Multiplanar, multiecho pulse sequences  of the brain and surrounding structures were obtained without intravenous contrast. Angiographic images of the head were obtained using MRA technique without contrast. COMPARISON:  10/12/2017 brain MRI FINDINGS: MRI HEAD FINDINGS Brain: Wedge of restricted diffusion extending from the upper right putamen across the corona radiata into the right caudate body. Remote left lenticulostriate infarct that is more anteriorly positioned. Small remote high and posterior right frontal cortex infarct. No acute hemorrhage, hydrocephalus, collection, or masslike finding. Vascular: Arterial findings below. Skull and upper cervical spine: Negative for marrow lesion Sinuses/Orbits: Negative MRA HEAD FINDINGS The carotid arteries are symmetric and diffusely patent. There is a moderate stenosis at the supraclinoid ICA on the left. Advanced distal right M1 segment stenosis which likely also continues into a proximal M2 branch. There is less full MCA branches on the right. The vertebral and basilar arteries are smooth and widely patent. Symmetric robust flow in posterior cerebral arteries. Negative for aneurysm IMPRESSION: 1. 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 2. Remote left basal ganglia infarct. 3. Small remote right frontal cortex infarct. 4. Moderate left supraclinoid ICA narrowing. Electronically Signed   By: Monte Fantasia M.D.   On: 12/08/2018 14:11   US Carotid Bilateral (at [facility] Only)  Result Date: 12/09/2018 CLINICAL DATA:  Acute CVA. EXAM: BILATERAL CAROTID DUPLEX ULTRASOUND TECHNIQUE: Pearline Cables scale imaging, color Doppler and duplex ultrasound were performed of bilateral carotid and vertebral arteries in the neck. COMPARISON:  Brain MRI 12/08/2018 FINDINGS: Criteria: Quantification of carotid stenosis is based on velocity parameters that correlate the residual internal carotid diameter with NASCET-based stenosis levels, using the diameter of the distal  internal carotid lumen as the denominator for stenosis measurement. The following velocity measurements were obtained: RIGHT ICA: 110 cm/sec CCA: 81 cm/sec SYSTOLIC ICA/CCA RATIO:  1.4 ECA: 185 cm/sec LEFT ICA: 134 cm/sec CCA: 628 cm/sec SYSTOLIC ICA/CCA RATIO:  1.3 ECA: 156 cm/sec RIGHT CAROTID ARTERY: Minimal plaque at the right carotid bulb and origin of the external carotid  artery. External carotid artery is patent with normal waveform. Normal waveforms and velocities in the internal carotid artery. RIGHT VERTEBRAL ARTERY: Antegrade flow and normal waveform in the right vertebral artery. LEFT CAROTID ARTERY: Small amount of plaque in the distal common carotid artery and bulb. External carotid artery is patent with normal waveform. Slightly elevated velocity in the distal internal carotid artery is probably related to tortuosity. No significant plaque or stenosis in the left internal carotid artery. LEFT VERTEBRAL ARTERY: Antegrade flow and normal waveform in the left vertebral artery. IMPRESSION: Minimal plaque in the carotid arteries. Estimated degree of stenosis in the internal carotid arteries is less than 50% bilaterally. Patent vertebral arteries with antegrade flow. Electronically Signed   By: Markus Daft M.D.   On: 12/09/2018 08:09   Ir US Guide Vasc Access Right  Result Date: 12/27/2018 CLINICAL DATA:  Right cervical hemisphere ischemic stroke secondary to high-grade right middle cerebral artery stenosis. Patient failed medical treatment. EXAM: IR ANGIO VERTEBRAL SEL SUBCLAVIAN INNOMINATE UNI LEFT MOD SED; BILATERAL COMMON CAROTID AND INNOMINATE ANGIOGRAPHY; IR ANGIO VERTEBRAL SEL VERTEBRAL UNI RIGHT MOD SED; IR ULTRASOUND GUIDANCE VASC ACCESS RIGHT COMPARISON:  MRI of the brain of 12/24/2018, and MRA of the brain of 12/08/2018. MEDICATIONS: Heparin 1000 units IV; no antibiotic was administered within 1 hour of the procedure. ANESTHESIA/SEDATION: Versed 1 mg IV; Fentanyl 25 mcg IV Moderate Sedation  Time:  34 minutes The patient was continuously monitored during the procedure by the interventional radiology nurse under my direct supervision. CONTRAST:  Isovue 300 approximately 60 mL. FLUOROSCOPY TIME:  Fluoroscopy Time: 10 minutes 36 seconds (502 mGy). COMPLICATIONS: None immediate. TECHNIQUE: Informed written consent was obtained from the patient after a thorough discussion of the procedural risks, benefits and alternatives. All questions were addressed. Maximal Sterile Barrier Technique was utilized including caps, mask, sterile gowns, sterile gloves, sterile drape, hand hygiene and skin antiseptic. A timeout was performed prior to the initiation of the procedure. The right forearm to the right wrist was prepped and draped in the usual sterile fashion. The right radial artery was identified, and dorsal palmar anastomosis was confirmed. Using ultrasound guidance, trans radial access into the right radial artery was then obtained without difficulty over a 0.018 inch wire, a 4 French radial sheath was then inserted without difficulty. Good aspiration obtained from the side port of the radial sheath. This was then connected to continuous heparinized saline infusion. A mixture of 2.5 mg of verapamil, 200 mcg of nitroglycerin and 2000 units of heparin was then slowly injected in a dilute form without any complications. An arteriogram was then performed through the sheath. Under intermittent fluoroscopy, a 5 French Simmons 2 diagnostic catheter was then advanced over a 0.035 inch Roadrunner guidewire to the right subclavian artery. Selective cannulation was then performed of the right vertebral artery, and also of the right common carotid artery, the left common carotid artery and the left vertebral artery after the Simmons 2 had been formed in the aortic arch region. Following the procedure, a right radial sheath was then removed with the successful application of a wrist band. A radial pulse was palpable distal to  the wrist band at the end of the procedure. FINDINGS: The origin of the dominant right vertebral artery demonstrates an approximately 50% stenosis at its origin. The vessel is, otherwise, seen to opacify to the cranial skull base. Wide patency is seen of the right vertebrobasilar junction and the right posterior-inferior cerebellar artery. The vertebrobasilar junction, the basilar artery, the  posterior cerebral arteries, the superior cerebellar arteries and the anterior-inferior cerebellar arteries opacify into the capillary and venous phases. Unopacified blood is seen in the proximal basilar artery from the contralateral vertebral artery. The left common carotid arteriogram demonstrates the origin of the left external carotid artery to be widely patent. Its branches are also patent. The left internal carotid artery at the bulb demonstrates a smooth shallow plaque along the posterior wall without evidence of ulcerations or intraluminal filling defects. More distally the left internal carotid artery is seen to opacify to the cranial skull base. The petrous and the cavernous segments are was widely patent. There is a moderate approximately 50% narrowing of the supraclinoid left ICA. The left middle cerebral artery has a 50% stenosis in the M1 segment. MCA branches, otherwise, demonstrate normal opacification into the capillary and venous phases. Also noted is mild atherosclerotic narrowing of the right anterior cerebral artery A1 segment. More distally, however, the right anterior cerebral artery opacifies into the capillary and venous phases. The right common carotid arteriogram demonstrates the right external carotid artery and its major branches to be widely patent. The right internal carotid artery at the bulb to the cranial skull base demonstrates wide patency. The petrous, cavernous and the supraclinoid segments are widely patent. The right middle cerebral artery demonstrates a diffuse narrowing with tapered  severe narrowing of the distal right MCA M1 segment. Patency of the superior and the inferior divisions is noted. The right anterior cerebral artery A1 segment and distally demonstrates wide patency into the capillary and venous phases. The venous phase also demonstrates the presence of bilateral high-riding jugular bulbs. The left vertebral artery origin demonstrates a severe 90% plus stenosis. More distally the left vertebral artery is seen to opacify to the cranial skull base. Wide patency is seen of the left vertebrobasilar junction, and the left posterior- inferior cerebellar artery. The opacified portions of the basilar artery on the lateral projection demonstrates wide patency. Unopacified blood is seen in the basilar artery from the contralateral vertebral artery. 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 Approximately 50% stenosis of the left internal carotid artery supraclinoid segment, the left middle cerebral artery M1 segment, and to a lesser degree the left anterior cerebral artery A1 segment. Approximately 50% stenosis of the dominant right vertebral artery at its origin. PLAN: Findings were reviewed with patient, the family and also the referring MD. The patient and the family would like to proceed with endovascular revascularization of symptomatic right middle cerebral artery distally. This will be undertaken as soon as possible. The procedure will be scheduled with anesthesia. Also the patient will be started on aspirin 81 mg a day, and Brilinta 90 mg b.i.d. Electronically Signed   By: Luanne Bras M.D.   On: 12/26/2018 09:00   Dg Chest Portable 1 View  Result Date: 12/24/2018 CLINICAL DATA:  Possible stroke. EXAM: PORTABLE CHEST 1 VIEW COMPARISON:  None. FINDINGS: Monitoring leads overlie the patient. Normal cardiac and mediastinal contours. Calcified granuloma right lower lung. No pleural effusion or pneumothorax. Thoracic spine  degenerative changes. IMPRESSION: No acute cardiopulmonary process. Electronically Signed   By: Lovey Newcomer M.D.   On: 12/24/2018 08:19   Mr Jodene Nam Head/brain BJ Cm  Result Date: 12/08/2018 CLINICAL DATA:  Stroke follow-up EXAM: MRI HEAD WITHOUT CONTRAST MRA HEAD WITHOUT CONTRAST TECHNIQUE: Multiplanar, multiecho pulse sequences of the brain and surrounding structures were obtained without intravenous contrast. Angiographic images of the head were obtained using MRA  technique without contrast. COMPARISON:  10/12/2017 brain MRI FINDINGS: MRI HEAD FINDINGS Brain: Wedge of restricted diffusion extending from the upper right putamen across the corona radiata into the right caudate body. Remote left lenticulostriate infarct that is more anteriorly positioned. Small remote high and posterior right frontal cortex infarct. No acute hemorrhage, hydrocephalus, collection, or masslike finding. Vascular: Arterial findings below. Skull and upper cervical spine: Negative for marrow lesion Sinuses/Orbits: Negative MRA HEAD FINDINGS The carotid arteries are symmetric and diffusely patent. There is a moderate stenosis at the supraclinoid ICA on the left. Advanced distal right M1 segment stenosis which likely also continues into a proximal M2 branch. There is less full MCA branches on the right. The vertebral and basilar arteries are smooth and widely patent. Symmetric robust flow in posterior cerebral arteries. Negative for aneurysm IMPRESSION: 1. 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 2. Remote left basal ganglia infarct. 3. Small remote right frontal cortex infarct. 4. Moderate left supraclinoid ICA narrowing. Electronically Signed   By: Monte Fantasia M.D.   On: 12/08/2018 14:11   Ct Head Code Stroke Wo Contrast  Result Date: 12/24/2018 CLINICAL DATA:  Code stroke. New onset of left-sided weakness and right-sided gaze. Fall. EXAM: CT HEAD WITHOUT CONTRAST TECHNIQUE:  Contiguous axial images were obtained from the base of the skull through the vertex without intravenous contrast. COMPARISON:  MRI brain 12/08/2018. FINDINGS: Brain: The subacute infarct involving the posterior right lentiform nucleus and centrum semi ovale is again noted. No new infarct is present. A remote lacunar infarct involving the left basal ganglia is stable. Periventricular Huster matter disease is stable. Basal ganglia are otherwise intact. Internal capsule is otherwise normal. No focal cortical abnormalities are present. The ventricles are of normal size. No significant extraaxial fluid collection is present. The brainstem and cerebellum are within normal limits. Vascular: Atherosclerotic calcifications are present within the cavernous internal carotid arteries. There is no hyperdense vessel. Skull: Calvarium is intact. No focal lytic or blastic lesions are present. Sinuses/Orbits: The right maxillary sinus is shrunken. There is no active disease. Paranasal sinuses are otherwise clear. Mastoid air cells are clear bilaterally. Globes and orbits are within normal limits. ASPECTS [facility] Stroke Program Early CT Score) - Ganglionic level infarction (caudate, lentiform nuclei, internal capsule, insula, M1-M3 cortex): 7/7 - Supraganglionic infarction (M4-M6 cortex): 3/3 Total score (0-10 with 10 being normal): 10/10 IMPRESSION: 1. No acute intracranial abnormality. 2. Recent infarct involving the posterior right lentiform nucleus and centrum semi ovale is again noted. There is no evidence for expansion of this infarct. 3. Atherosclerosis without hyperdense vessel. 4. ASPECTS is 10/10 The above was relayed via text pager to Dr. Lorraine Lax on 12/24/2018 at 08:02 . Electronically Signed   By: San Morelle M.D.   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 Patient failed medical treatment. EXAM: IR ANGIO VERTEBRAL SEL SUBCLAVIAN INNOMINATE UNI LEFT MOD SED; BILATERAL COMMON CAROTID AND INNOMINATE ANGIOGRAPHY; IR ANGIO VERTEBRAL SEL VERTEBRAL UNI RIGHT MOD SED; IR ULTRASOUND GUIDANCE VASC ACCESS RIGHT COMPARISON:  MRI of the brain of 12/24/2018, and MRA of the brain of 12/08/2018. MEDICATIONS: Heparin 1000 units IV; no antibiotic was administered within 1 hour of the procedure. ANESTHESIA/SEDATION: Versed 1 mg IV; Fentanyl 25 mcg IV Moderate Sedation Time:  34 minutes The patient was continuously  monitored during the procedure by the interventional radiology nurse under my direct supervision. CONTRAST:  Isovue 300 approximately 60 mL. FLUOROSCOPY TIME:  Fluoroscopy Time: 10 minutes 36 seconds (502 mGy). COMPLICATIONS: None immediate. TECHNIQUE: Informed written consent was obtained from the patient after a thorough discussion of the procedural risks, benefits and alternatives. All questions were addressed. Maximal Sterile Barrier Technique was utilized including caps, mask, sterile gowns, sterile gloves, sterile drape, hand hygiene and skin antiseptic. A timeout was performed prior to the initiation of the procedure. The right forearm to the right wrist was prepped and draped in the usual sterile fashion. The right radial artery was identified, and dorsal palmar anastomosis was confirmed. Using ultrasound guidance, trans radial access into the right radial artery was then obtained without difficulty over a 0.018 inch wire, a 4 French radial sheath was then inserted without difficulty. Good aspiration obtained from the side port of the radial sheath. This was then connected to continuous heparinized saline infusion. A mixture of 2.5 mg of verapamil, 200 mcg of nitroglycerin and 2000 units of heparin was then slowly injected in a dilute form without any complications. An arteriogram was then performed through the sheath. Under  intermittent fluoroscopy, a 5 French Simmons 2 diagnostic catheter was then advanced over a 0.035 inch Roadrunner guidewire to the right subclavian artery. Selective cannulation was then performed of the right vertebral artery, and also of the right common carotid artery, the left common carotid artery and the left vertebral artery after the Simmons 2 had been formed in the aortic arch region. Following the procedure, a right radial sheath was then removed with the successful application of a wrist band. A radial pulse was palpable distal to the wrist band at the end of the procedure. FINDINGS: The origin of the dominant right vertebral artery demonstrates an approximately 50% stenosis at its origin. The vessel is, otherwise, seen to opacify to the cranial skull base. Wide patency is seen of the right vertebrobasilar junction and the right posterior-inferior cerebellar artery. The vertebrobasilar junction, the basilar artery, the posterior cerebral arteries, the superior cerebellar arteries and the anterior-inferior cerebellar arteries opacify into the capillary and venous phases. Unopacified blood is seen in the proximal basilar artery from the contralateral vertebral artery. The left common carotid arteriogram demonstrates the origin of the left external carotid artery to be widely patent. Its branches are also patent. The left internal carotid artery at the bulb demonstrates a smooth shallow plaque along the posterior wall without evidence of ulcerations or intraluminal filling defects. More distally the left internal carotid artery is seen to opacify to the cranial skull base. The petrous and the cavernous segments are was widely patent. There is a moderate approximately 50% narrowing of the supraclinoid left ICA. The left middle cerebral artery has a 50% stenosis in the M1 segment. MCA branches, otherwise, demonstrate normal opacification into the capillary and venous phases. Also noted is mild atherosclerotic  narrowing of the right anterior cerebral artery A1 segment. More distally, however, the right anterior cerebral artery opacifies into the capillary and venous phases. The right common carotid arteriogram demonstrates the right external carotid artery and its major branches to be widely patent. The right internal carotid artery at the bulb to the cranial skull base demonstrates wide patency. The petrous, cavernous and the supraclinoid segments are widely patent. The right middle cerebral artery demonstrates a diffuse narrowing with tapered severe narrowing of the distal right MCA M1 segment. Patency of the superior and the inferior divisions  is noted. The right anterior cerebral artery A1 segment and distally demonstrates wide patency into the capillary and venous phases. The venous phase also demonstrates the presence of bilateral high-riding jugular bulbs. The left vertebral artery origin demonstrates a severe 90% plus stenosis. More distally the left vertebral artery is seen to opacify to the cranial skull base. Wide patency is seen of the left vertebrobasilar junction, and the left posterior- inferior cerebellar artery. The opacified portions of the basilar artery on the lateral projection demonstrates wide patency. Unopacified blood is seen in the basilar artery from the contralateral vertebral artery. 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 Approximately 50% stenosis of the left internal carotid artery supraclinoid segment, the left middle cerebral artery M1 segment, and to a lesser degree the left anterior cerebral artery A1 segment. Approximately 50% stenosis of the dominant right vertebral artery at its origin. PLAN: Findings were reviewed with patient, the family and also the referring MD. The patient and the family would like to proceed with endovascular revascularization of symptomatic right middle cerebral artery distally. This will be undertaken as  soon as possible. The procedure will be scheduled with anesthesia. Also the patient will be started on aspirin 81 mg a day, and Brilinta 90 mg b.i.d. Electronically Signed   By: Luanne Bras M.D.   On: 12/26/2018 09:00   Ir Angio Vertebral Sel Subclavian Innominate Uni L Mod Sed  Result Date: 12/27/2018 CLINICAL DATA:  Right cervical hemisphere ischemic stroke secondary to high-grade right middle cerebral artery stenosis. Patient failed medical treatment. EXAM: IR ANGIO VERTEBRAL SEL SUBCLAVIAN INNOMINATE UNI LEFT MOD SED; BILATERAL COMMON CAROTID AND INNOMINATE ANGIOGRAPHY; IR ANGIO VERTEBRAL SEL VERTEBRAL UNI RIGHT MOD SED; IR ULTRASOUND GUIDANCE VASC ACCESS RIGHT COMPARISON:  MRI of the brain of 12/24/2018, and MRA of the brain of 12/08/2018. MEDICATIONS: Heparin 1000 units IV; no antibiotic was administered within 1 hour of the procedure. ANESTHESIA/SEDATION: Versed 1 mg IV; Fentanyl 25 mcg IV Moderate Sedation Time:  34 minutes The patient was continuously monitored during the procedure by the interventional radiology nurse under my direct supervision. CONTRAST:  Isovue 300 approximately 60 mL. FLUOROSCOPY TIME:  Fluoroscopy Time: 10 minutes 36 seconds (502 mGy). COMPLICATIONS: None immediate. TECHNIQUE: Informed written consent was obtained from the patient after a thorough discussion of the procedural risks, benefits and alternatives. All questions were addressed. Maximal Sterile Barrier Technique was utilized including caps, mask, sterile gowns, sterile gloves, sterile drape, hand hygiene and skin antiseptic. A timeout was performed prior to the initiation of the procedure. The right forearm to the right wrist was prepped and draped in the usual sterile fashion. The right radial artery was identified, and dorsal palmar anastomosis was confirmed. Using ultrasound guidance, trans radial access into the right radial artery was then obtained without difficulty over a 0.018 inch wire, a 4 French radial  sheath was then inserted without difficulty. Good aspiration obtained from the side port of the radial sheath. This was then connected to continuous heparinized saline infusion. A mixture of 2.5 mg of verapamil, 200 mcg of nitroglycerin and 2000 units of heparin was then slowly injected in a dilute form without any complications. An arteriogram was then performed through the sheath. Under intermittent fluoroscopy, a 5 French Simmons 2 diagnostic catheter was then advanced over a 0.035 inch Roadrunner guidewire to the right subclavian artery. Selective cannulation was then performed of the right vertebral artery, and also of the right common carotid artery, the left  common carotid artery and the left vertebral artery after the Simmons 2 had been formed in the aortic arch region. Following the procedure, a right radial sheath was then removed with the successful application of a wrist band. A radial pulse was palpable distal to the wrist band at the end of the procedure. FINDINGS: The origin of the dominant right vertebral artery demonstrates an approximately 50% stenosis at its origin. The vessel is, otherwise, seen to opacify to the cranial skull base. Wide patency is seen of the right vertebrobasilar junction and the right posterior-inferior cerebellar artery. The vertebrobasilar junction, the basilar artery, the posterior cerebral arteries, the superior cerebellar arteries and the anterior-inferior cerebellar arteries opacify into the capillary and venous phases. Unopacified blood is seen in the proximal basilar artery from the contralateral vertebral artery. The left common carotid arteriogram demonstrates the origin of the left external carotid artery to be widely patent. Its branches are also patent. The left internal carotid artery at the bulb demonstrates a smooth shallow plaque along the posterior wall without evidence of ulcerations or intraluminal filling defects. More distally the left internal carotid  artery is seen to opacify to the cranial skull base. The petrous and the cavernous segments are was widely patent. There is a moderate approximately 50% narrowing of the supraclinoid left ICA. The left middle cerebral artery has a 50% stenosis in the M1 segment. MCA branches, otherwise, demonstrate normal opacification into the capillary and venous phases. Also noted is mild atherosclerotic narrowing of the right anterior cerebral artery A1 segment. More distally, however, the right anterior cerebral artery opacifies into the capillary and venous phases. The right common carotid arteriogram demonstrates the right external carotid artery and its major branches to be widely patent. The right internal carotid artery at the bulb to the cranial skull base demonstrates wide patency. The petrous, cavernous and the supraclinoid segments are widely patent. The right middle cerebral artery demonstrates a diffuse narrowing with tapered severe narrowing of the distal right MCA M1 segment. Patency of the superior and the inferior divisions is noted. The right anterior cerebral artery A1 segment and distally demonstrates wide patency into the capillary and venous phases. The venous phase also demonstrates the presence of bilateral high-riding jugular bulbs. The left vertebral artery origin demonstrates a severe 90% plus stenosis. More distally the left vertebral artery is seen to opacify to the cranial skull base. Wide patency is seen of the left vertebrobasilar junction, and the left posterior- inferior cerebellar artery. The opacified portions of the basilar artery on the lateral projection demonstrates wide patency. Unopacified blood is seen in the basilar artery from the contralateral vertebral artery. 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 Approximately 50% stenosis of the left internal carotid artery supraclinoid segment, the left middle cerebral artery M1 segment,  and to a lesser degree the left anterior cerebral artery A1 segment. Approximately 50% stenosis of the dominant right vertebral artery at its origin. PLAN: Findings were reviewed with patient, the family and also the referring MD. The patient and the family would like to proceed with endovascular revascularization of symptomatic right middle cerebral artery distally. This will be undertaken as soon as possible. The procedure will be scheduled with anesthesia. Also the patient will be started on aspirin 81 mg a day, and Brilinta 90 mg b.i.d. Electronically Signed   By: Luanne Bras M.D.   On: 12/26/2018 09:00   Ir Angio Vertebral Sel Vertebral Uni R Mod Sed  Result  Date: 12/27/2018 CLINICAL DATA:  Right cervical hemisphere ischemic stroke secondary to high-grade right middle cerebral artery stenosis. Patient failed medical treatment. EXAM: IR ANGIO VERTEBRAL SEL SUBCLAVIAN INNOMINATE UNI LEFT MOD SED; BILATERAL COMMON CAROTID AND INNOMINATE ANGIOGRAPHY; IR ANGIO VERTEBRAL SEL VERTEBRAL UNI RIGHT MOD SED; IR ULTRASOUND GUIDANCE VASC ACCESS RIGHT COMPARISON:  MRI of the brain of 12/24/2018, and MRA of the brain of 12/08/2018. MEDICATIONS: Heparin 1000 units IV; no antibiotic was administered within 1 hour of the procedure. ANESTHESIA/SEDATION: Versed 1 mg IV; Fentanyl 25 mcg IV Moderate Sedation Time:  34 minutes The patient was continuously monitored during the procedure by the interventional radiology nurse under my direct supervision. CONTRAST:  Isovue 300 approximately 60 mL. FLUOROSCOPY TIME:  Fluoroscopy Time: 10 minutes 36 seconds (502 mGy). COMPLICATIONS: None immediate. TECHNIQUE: Informed written consent was obtained from the patient after a thorough discussion of the procedural risks, benefits and alternatives. All questions were addressed. Maximal Sterile Barrier Technique was utilized including caps, mask, sterile gowns, sterile gloves, sterile drape, hand hygiene and skin antiseptic. A timeout  was performed prior to the initiation of the procedure. The right forearm to the right wrist was prepped and draped in the usual sterile fashion. The right radial artery was identified, and dorsal palmar anastomosis was confirmed. Using ultrasound guidance, trans radial access into the right radial artery was then obtained without difficulty over a 0.018 inch wire, a 4 French radial sheath was then inserted without difficulty. Good aspiration obtained from the side port of the radial sheath. This was then connected to continuous heparinized saline infusion. A mixture of 2.5 mg of verapamil, 200 mcg of nitroglycerin and 2000 units of heparin was then slowly injected in a dilute form without any complications. An arteriogram was then performed through the sheath. Under intermittent fluoroscopy, a 5 French Simmons 2 diagnostic catheter was then advanced over a 0.035 inch Roadrunner guidewire to the right subclavian artery. Selective cannulation was then performed of the right vertebral artery, and also of the right common carotid artery, the left common carotid artery and the left vertebral artery after the Simmons 2 had been formed in the aortic arch region. Following the procedure, a right radial sheath was then removed with the successful application of a wrist band. A radial pulse was palpable distal to the wrist band at the end of the procedure. FINDINGS: The origin of the dominant right vertebral artery demonstrates an approximately 50% stenosis at its origin. The vessel is, otherwise, seen to opacify to the cranial skull base. Wide patency is seen of the right vertebrobasilar junction and the right posterior-inferior cerebellar artery. The vertebrobasilar junction, the basilar artery, the posterior cerebral arteries, the superior cerebellar arteries and the anterior-inferior cerebellar arteries opacify into the capillary and venous phases. Unopacified blood is seen in the proximal basilar artery from the  contralateral vertebral artery. The left common carotid arteriogram demonstrates the origin of the left external carotid artery to be widely patent. Its branches are also patent. The left internal carotid artery at the bulb demonstrates a smooth shallow plaque along the posterior wall without evidence of ulcerations or intraluminal filling defects. More distally the left internal carotid artery is seen to opacify to the cranial skull base. The petrous and the cavernous segments are was widely patent. There is a moderate approximately 50% narrowing of the supraclinoid left ICA. The left middle cerebral artery has a 50% stenosis in the M1 segment. MCA branches, otherwise, demonstrate normal opacification into the capillary and venous  phases. Also noted is mild atherosclerotic narrowing of the right anterior cerebral artery A1 segment. More distally, however, the right anterior cerebral artery opacifies into the capillary and venous phases. The right common carotid arteriogram demonstrates the right external carotid artery and its major branches to be widely patent. The right internal carotid artery at the bulb to the cranial skull base demonstrates wide patency. The petrous, cavernous and the supraclinoid segments are widely patent. The right middle cerebral artery demonstrates a diffuse narrowing with tapered severe narrowing of the distal right MCA M1 segment. Patency of the superior and the inferior divisions is noted. The right anterior cerebral artery A1 segment and distally demonstrates wide patency into the capillary and venous phases. The venous phase also demonstrates the presence of bilateral high-riding jugular bulbs. The left vertebral artery origin demonstrates a severe 90% plus stenosis. More distally the left vertebral artery is seen to opacify to the cranial skull base. Wide patency is seen of the left vertebrobasilar junction, and the left posterior- inferior cerebellar artery. The opacified portions  of the basilar artery on the lateral projection demonstrates wide patency. Unopacified blood is seen in the basilar artery from the contralateral vertebral artery. 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 Approximately 50% stenosis of the left internal carotid artery supraclinoid segment, the left middle cerebral artery M1 segment, and to a lesser degree the left anterior cerebral artery A1 segment. Approximately 50% stenosis of the dominant right vertebral artery at its origin. PLAN: Findings were reviewed with patient, the family and also the referring MD. The patient and the family would like to proceed with endovascular revascularization of symptomatic right middle cerebral artery distally. This will be undertaken as soon as possible. The procedure will be scheduled with anesthesia. Also the patient will be started on aspirin 81 mg a day, and Brilinta 90 mg b.i.d. Electronically Signed   By: Luanne Bras M.D.   On: 12/26/2018 09:00    Labs:  CBC: Recent Labs    12/24/18 0750 12/24/18 0757 12/25/18 0650 12/26/18 0454 01/04/19 0619  WBC 4.4  --  3.5* 2.8* 4.7  HGB 13.2 13.6 12.8 12.0 13.9  HCT 40.4 40.0 37.7 36.0 42.2  PLT 184  --  178 147* 288    COAGS: Recent Labs    12/24/18 0750 01/04/19 0619  INR 1.07 0.96  APTT 22*  --     BMP: Recent Labs    12/24/18 0750 12/24/18 0757 12/25/18 0650 12/26/18 0454 01/04/19 0619  NA 136 134* 137 136 137  K 4.6 4.7 3.9 3.6 3.7  CL 103 103 103 104 104  CO2 24  --  25 25 23   GLUCOSE 152* 148* 96 96 97  BUN 15 18 14 13 14   CALCIUM 9.5  --  9.0 8.7* 9.7  CREATININE 1.10* 1.10* 0.98 0.71 0.77  GFRNONAA 50*  --  58* >60 >60  GFRAA 58*  --  >60 >60 >60    LIVER FUNCTION TESTS: Recent Labs    12/08/18 0929 12/24/18 0750  BILITOT 0.5 0.9  AST 24 26  ALT 21 21  ALKPHOS 73 63  PROT 7.1 7.0  ALBUMIN 4.3 4.1    TUMOR MARKERS: No results for input(s): AFPTM, CEA, CA199,  CHROMGRNA in the last 8760 hours.  Assessment and Plan:  Severe  Middle cerebral artery stenosis.  P2y12 today is 8 - Dr. Estanislado Pandy ok with this.  Will proceed with angiography with cerebral intervention/angioplasty/stent today  by Dr. Estanislado Pandy.  Risks and benefits of cerebral angiogram with intervention were discussed with the patient including, but not limited to bleeding, infection, vascular injury, contrast induced renal failure, stroke or even death.  This interventional procedure involves the use of X-rays and because of the nature of the planned procedure, it is possible that we will have prolonged use of X-ray fluoroscopy.  Potential radiation risks to you include (but are not limited to) the following: - A slightly elevated risk for cancer  several years later in life. This risk is typically less than 0.5% percent. This risk is low in comparison to the normal incidence of human cancer, which is 33% for women and 50% for men according to the [place]. - Radiation induced injury can include skin redness, resembling a rash, tissue breakdown / ulcers and hair loss (which can be temporary or permanent).   The likelihood of either of these occurring depends on the difficulty of the procedure and whether you are sensitive to radiation due to previous procedures, disease, or genetic conditions.   IF your procedure requires a prolonged use of radiation, you will be notified and given written instructions for further action.  It is your responsibility to monitor the irradiated area for the 2 weeks following the procedure and to notify your physician if you are concerned that you have suffered a radiation induced injury.    All of the patient's questions were answered, patient is agreeable to proceed.  Consent signed and in chart.  Electronically Signed: Murrell Redden, PA-C   01/04/2019, 8:52 AM      I spent a total of    25 Minutes in face to face in clinical  consultation, greater than 50% of which was counseling/coordinating care for cerebral angio with intervention.

## 2019-01-04 NOTE — Anesthesia Procedure Notes (Signed)
Arterial Line Insertion Start/End1/23/2020 8:40 AM, 01/04/2019 8:50 AM Performed by: Leonor Liv, CRNA, CRNA  Patient location: Pre-op. Preanesthetic checklist: patient identified, IV checked, site marked, risks and benefits discussed, surgical consent, monitors and equipment checked, pre-op evaluation, timeout performed and anesthesia consent Lidocaine 1% used for infiltration Right, radial was placed Catheter size: 20 G Hand hygiene performed  and maximum sterile barriers used  Allen's test indicative of satisfactory collateral circulation Attempts: 1 Procedure performed without using ultrasound guided technique. Following insertion, dressing applied and Biopatch. Post procedure assessment: normal  Patient tolerated the procedure well with no immediate complications.

## 2019-01-04 NOTE — Sedation Documentation (Signed)
Right groin sheath removed. 6Fr. Exoseal applied.

## 2019-01-04 NOTE — Procedures (Signed)
S/P  RT common carotid arteriogram followed by stent assisted angioplasty of RT MCA stenosis

## 2019-01-05 ENCOUNTER — Inpatient Hospital Stay (HOSPITAL_COMMUNITY): Payer: Medicare Other

## 2019-01-05 ENCOUNTER — Other Ambulatory Visit (HOSPITAL_COMMUNITY): Payer: Self-pay | Admitting: Internal Medicine

## 2019-01-05 ENCOUNTER — Encounter (HOSPITAL_COMMUNITY): Payer: Self-pay | Admitting: Interventional Radiology

## 2019-01-05 ENCOUNTER — Ambulatory Visit: Payer: Medicare Other

## 2019-01-05 DIAGNOSIS — I6601 Occlusion and stenosis of right middle cerebral artery: Principal | ICD-10-CM

## 2019-01-05 DIAGNOSIS — I639 Cerebral infarction, unspecified: Secondary | ICD-10-CM

## 2019-01-05 LAB — CBC WITH DIFFERENTIAL/PLATELET
Abs Immature Granulocytes: 0.05 10*3/uL (ref 0.00–0.07)
Basophils Absolute: 0 10*3/uL (ref 0.0–0.1)
Basophils Relative: 0 %
Eosinophils Absolute: 0 10*3/uL (ref 0.0–0.5)
Eosinophils Relative: 0 %
HCT: 35.1 % — ABNORMAL LOW (ref 36.0–46.0)
Hemoglobin: 11.9 g/dL — ABNORMAL LOW (ref 12.0–15.0)
Immature Granulocytes: 1 %
Lymphocytes Relative: 15 %
Lymphs Abs: 1.3 10*3/uL (ref 0.7–4.0)
MCH: 30.5 pg (ref 26.0–34.0)
MCHC: 33.9 g/dL (ref 30.0–36.0)
MCV: 90 fL (ref 80.0–100.0)
Monocytes Absolute: 0.7 10*3/uL (ref 0.1–1.0)
Monocytes Relative: 8 %
Neutro Abs: 6.5 10*3/uL (ref 1.7–7.7)
Neutrophils Relative %: 76 %
Platelets: 279 10*3/uL (ref 150–400)
RBC: 3.9 MIL/uL (ref 3.87–5.11)
RDW: 11.6 % (ref 11.5–15.5)
WBC: 8.6 10*3/uL (ref 4.0–10.5)
nRBC: 0 % (ref 0.0–0.2)

## 2019-01-05 LAB — BASIC METABOLIC PANEL
Anion gap: 7 (ref 5–15)
BUN: 7 mg/dL — ABNORMAL LOW (ref 8–23)
CO2: 20 mmol/L — ABNORMAL LOW (ref 22–32)
Calcium: 8.9 mg/dL (ref 8.9–10.3)
Chloride: 112 mmol/L — ABNORMAL HIGH (ref 98–111)
Creatinine, Ser: 0.66 mg/dL (ref 0.44–1.00)
GFR calc Af Amer: >60 mL/min (ref >60)
GFR calc non Af Amer: >60 mL/min (ref >60)
Glucose, Bld: 121 mg/dL — ABNORMAL HIGH (ref 70–99)
Potassium: 3.7 mmol/L (ref 3.5–5.1)
Sodium: 139 mmol/L (ref 135–145)

## 2019-01-05 NOTE — Progress Notes (Signed)
Received page from [place], [place] regarding questionable worsening left facial droop. Went to evaluate patient with Dr. Estanislado Pandy.  Patient awake and alert sitting in bed. Her daughter states she noticed her known left facial droop "slightly worsen".  On PE, mild left facial droop when smiling. PERRL bilaterally. Tongue midline. Fine motor and coordination intact and symmetric.  Code stroke was initiated.  Obtain STAT MRI/MRA brain/head without contrast. Hold off on discharge until after results. IR to follow.  Bea Graff Louk, PA-C 01/05/2019, 11:00 AM

## 2019-01-05 NOTE — Progress Notes (Signed)
1042 : Patients family called out concerning new facial droop on left side. NIHSS 1, neurologically patient alert and oriented, states she feels "fine." Ally PA notified.   10:47- Sarah RN code stroke called. Dr. Estanislado Pandy and PA Radonna Ricker at bedside.   Fransico Michael RN BSN.

## 2019-01-05 NOTE — Consult Note (Signed)
NEURO HOSPITALIST CONSULT NOTE   Requestig physician: Dr. Estanislado Pandy  Reason for Consult: Acute onset of worsened left facial droop  History obtained from:  Patient, Family and Chart    HPI:                                                                                                                                          Shannon [name] is an 73 y.o. female with a PMHx of recent stroke in December with severe right M1 stenosis and subsequent TIA in early January referable to hypoperfusion in the right MCA territory secondary to above stenosis. She was admitted yesterday to the [facility] service for right common carotid arteriogram followed by stent-assisted angioplasty of the right MCA stenosis. She stayed overnight in the 4N ICU following the procedure. Today at 7 AM the patients family alerted RN to a new left-sided facial droop. NIHSS was 1. Code Stroke was called and emergent MRI brain ordered to assess for possible new stroke.   Past Medical History:  Diagnosis Date  . 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 Cancer ([place])    skin  . Complication of anesthesia    " I had difficulty coming out of it"  . Family history of adverse reaction to anesthesia    " My daughter has a problem coming ou to it"  . GERD (gastroesophageal reflux disease)   . Hypertension   . Stroke ([place])   . Thyroid disease     Past Surgical History:  Procedure Laterality Date  . ABDOMINAL HYSTERECTOMY    . APPENDECTOMY    . CARPAL TUNNEL RELEASE Bilateral   . COLONOSCOPY    . DILATION AND CURETTAGE OF UTERUS    . IR ANGIO INTRA EXTRACRAN SEL COM CAROTID INNOMINATE BILAT MOD SED  12/25/2018  . IR ANGIO VERTEBRAL SEL SUBCLAVIAN INNOMINATE UNI L MOD SED  12/25/2018  . IR ANGIO VERTEBRAL SEL VERTEBRAL UNI R MOD SED  12/25/2018  . IR US GUIDE VASC ACCESS RIGHT  12/25/2018  . OVARIAN CYST REMOVAL      Family History  Problem Relation Age of Onset  . CVA Mother   . Heart disease Mother   .  Peripheral Artery Disease Father              Social History:  reports that she has never smoked. She has never used smokeless tobacco. She reports that she does not drink alcohol or use drugs.  Allergies  Allergen Reactions  . Other Palpitations    SINUTAB  . Sulfa Antibiotics Other (See Comments)    Tongue turned black  . Mandol [Cefamandole] Rash    MEDICATIONS:  Scheduled: . sodium chloride   Intravenous Once  . aspirin  81 mg Oral Daily   Or  . aspirin  81 mg Per Tube Daily  . ticagrelor  90 mg Oral BID   Or  . ticagrelor  90 mg Per Tube BID   Continuous: . sodium chloride Stopped (01/05/19 0854)  . clevidipine Stopped (01/05/19 0711)     ROS:                                                                                                                                       She states that her face feels normal. Denies limb weakness or numbness. No trouble with vision or language. Other ROS as per HPI, with additional system review deferred in the context of acuity of presentation.    Blood pressure (!) 124/56, pulse 80, temperature 97.6 F (36.4 C), temperature source Oral, resp. rate 15, height 5\' 5"  (1.651 m), weight 54.7 kg, SpO2 100 %.   General Examination:                                                                                                       Physical Exam  HEENT-  [place]/AT  Lungs- Respirations unlabored Extremities- Warm and well perfused   Neurological Examination Mental Status:  Alert, oriented x 5, thought content appropriate.  Speech fluent with intact comprehension and naming.  Able to follow a 3 step command but with one error.  Cranial Nerves: II: Visual fields intact with no extinction to DSS. PERRL. III,IV, VI: EOMI with saccadic visual pursuits noted. No ptosis. No nystagmus.  V,VII: Subtle left facial droop. Facial  temp sensation equal bilaterally  VIII: hearing intact to voice IX,X: No hypophonia.  XI: Symmetric XII: midline tongue extension Motor: Right : Upper extremity   5/5    Left:     Upper extremity   5/5  Lower extremity   5/5     Lower extremity   5/5 Normal tone throughout; no atrophy noted Sensory: Temp and light touch intact x 4 without extinction Deep Tendon Reflexes: 2+ to 3+ and symmetric throughout Plantars: Right: downgoing   Left: downgoing Cerebellar: No ataxia with FNF or H-S bilaterally Gait: Deferred   Lab Results: Basic Metabolic Panel: Recent Labs  Lab 01/04/19 0619 01/04/19 1136 01/05/19 0457  NA 137 141 139  K 3.7 3.5 3.7  CL 104  --  112*  CO2 23  --  20*  GLUCOSE 97  --  121*  BUN 14  --  7*  CREATININE 0.77  --  0.66  CALCIUM 9.7  --  8.9    CBC: Recent Labs  Lab 01/04/19 0619 01/04/19 1136 01/05/19 0457  WBC 4.7  --  8.6  NEUTROABS 2.7  --  6.5  HGB 13.9 8.5* 11.9*  HCT 42.2 25.0* 35.1*  MCV 92.1  --  90.0  PLT 288  --  279    Cardiac Enzymes: No results for input(s): CKTOTAL, CKMB, CKMBINDEX, TROPONINI in the last 168 hours.  Lipid Panel: No results for input(s): CHOL, TRIG, HDL, CHOLHDL, VLDL, LDLCALC in the last 168 hours.  Imaging: No results found.   Assessment: 73 year old female s/p right MCA stent-assisted angioplasty for right M1 stenosis. Code Stroke called for new left facial droop.  1. Mild/subtle left facial droop involving the lower quadrant of the face. No other neurological findings.  2. DDx includes incidental facial asymmetry versus small new right MCA stroke  Recommendations: 1. STAT MRI brain 2. Additional recommendations following MRI   Electronically signed: Dr. Kerney Elbe 01/05/2019, 11:52 AM

## 2019-01-05 NOTE — Discharge Summary (Addendum)
Patient ID: Shannon [name] MRN: [id number] DOB/AGE: [id number] 73 y.o.  Admit date: 01/04/2019 Discharge date: 01/05/2019  Supervising Physician: Luanne Bras  Patient Status: [facility] - In-pt  Admission Diagnoses: Middle cerebral artery stenosis, right  Discharge Diagnoses:  Active Problems:   Middle cerebral artery stenosis, right   Discharged Condition: stable  Hospital Course:  Patient presented to [facility] 01/04/2019 for an image-guided cerebral arteriogram with revascularization of her right middle cerebral artery stenosis using stent assisted angioplasty by Dr. Estanislado Pandy. Procedure occurred without major complications and patient was transferred to neuro ICU in stable condition (VSS, right groin incision stable) for overnight observation. No major events occurred overnight.  Patient awake and alert laying in bed with no complaints at this time. Accompanied by daughter at bedside. No focal neurologic symptoms noted. Right groin incision stable. Patient was pending discharge when her daughter noticed a "slightly worsen" left facial droop. Code stroke was initiated. Patient underwent MRI/A brain/head which revealed 2 small focal areas of restricted diffusion (probable extension of old stroke). Plan to discharge home today and follow-up with Dr. Estanislado Pandy in clinic 2 weeks after discharge.   Patient to continue taking Brilinta 90 mg twice daily and Aspirin 81 mg once daily. Phoned prescription to [facility] in [place], [place] [phone])- Brilinta 90 mg tablets taken twice daily, dispense 60 tablets with 3 refills.   Consults: neurology  Significant Diagnostic Studies: Dg Chest 2 View  Result Date: 12/26/2018 CLINICAL DATA:  Fever, cough for several days EXAM: CHEST - 2 VIEW COMPARISON:  Portable chest x-ray of 12/24/2018 FINDINGS: No active infiltrate or effusion is seen. A calcified granuloma is again noted at the right lung base. Mediastinal and hilar contours are  unremarkable and the heart is within upper limits of normal in size. No bony abnormality is seen. IMPRESSION: No active lung disease.  Calcified granuloma at the right lung base. Electronically Signed   By: Ivar Drape M.D.   On: 12/26/2018 10:16   Ct Head Wo Contrast  Result Date: 12/08/2018 CLINICAL DATA:  Right-sided headache for 6 days. Fatigue. Right-sided mouth weakness EXAM: CT HEAD WITHOUT CONTRAST TECHNIQUE: Contiguous axial images were obtained from the base of the skull through the vertex without intravenous contrast. COMPARISON:  Head CT and brain MRI October 12, 2017 FINDINGS: Brain: The ventricles are normal in size and configuration. There is no intracranial mass, hemorrhage, extra-axial fluid collection, or midline shift. There is decreased attenuation involving much of the right lentiform nucleus as well as involving portions of the right internal and external capsule, concerning for acute right basal ganglia infarct. There is evidence of a prior infarct involving the anterior limb of the left external capsule, stable. Brain parenchyma otherwise appears unremarkable. Vascular: No hyperdense vessel. There is calcification in each carotid siphon region. Skull: The bony calvarium appears intact. Sinuses/Orbits: There is mild mucosal thickening in several ethmoid air cells. Other visualized paranasal sinuses are clear. Visualized orbits appear symmetric bilaterally. Other: Mastoid air cells are clear. IMPRESSION: Apparent acute infarct involving much of the right basal ganglia. Localized cytotoxic edema in this area noted. Prior infarct anterior limb left external capsule. No mass or hemorrhage. There are foci of arterial vascular calcification. There is mild mucosal thickening in several ethmoid air cells. Electronically Signed   By: Lowella Grip III M.D.   On: 12/08/2018 09:42   Ct Cervical Spine Wo Contrast  Result Date: 12/24/2018 CLINICAL DATA:  Episode of left-sided weakness and  right-sided gaze. Fall. EXAM: CT CERVICAL SPINE  WITHOUT CONTRAST TECHNIQUE: Multidetector CT imaging of the cervical spine was performed without intravenous contrast. Multiplanar CT image reconstructions were also generated. COMPARISON:  None. FINDINGS: Alignment: AP alignment is anatomic. There straightening of the normal cervical lordosis. The patient is an a hard collar. Skull base and vertebrae: Craniocervical junction is normal. Vertebral body heights are maintained. No acute fracture is present. Soft tissues and spinal canal: Thyroid is markedly atrophic or surgically absent. No adenopathy is present. Salivary glands are within normal limits. Vascular calcifications are noted at the left carotid bifurcation, at the origin of the right vertebral artery, and at the innominate artery. Disc levels: Uncovertebral disease results in moderate right foraminal narrowing at C5-6 and mild bilateral foraminal narrowing at C6-7. Upper chest: The lung apices are clear. Thoracic inlet is within normal limits. IMPRESSION: 1. No acute trauma. 2. Atherosclerosis. 3. Degenerative changes of the cervical spine are most evident at C5-6 on the right and bilaterally at C6-7. 4. Straightening of the normal cervical lordosis. This may be related to positioning as the patient is an a hard collar. Electronically Signed   By: San Morelle M.D.   On: 12/24/2018 08:14   Mr Brain Wo Contrast  Result Date: 12/24/2018 CLINICAL DATA:  Focal neuro deficit. New onset left-sided weakness and right-sided gaze. Fall. Recent right basal ganglia infarct. EXAM: MRI HEAD WITHOUT CONTRAST TECHNIQUE: Multiplanar, multiecho pulse sequences of the brain and surrounding structures were obtained without intravenous contrast. COMPARISON:  CT head without contrast 12/24/2018. MRI brain 12/08/2018. FINDINGS: Brain: The superior aspect of the previous infarct demonstrates some restricted diffusion signal. There is no significant restricted diffusion  on the ADC map. No new areas of infarction are evident. T2 signal changes are associated with the recent infarct. Periventricular Briel matter changes are otherwise stable. The remote infarct of the left caudate head and lentiform nucleus is again seen. A remote lacunar infarct is again noted in the left thalamus. No hemorrhage or mass lesion is present. The ventricles are of normal size. No significant extraaxial fluid collection is present. The internal auditory canals are within normal limits. The brainstem and cerebellum are within normal limits. Vascular: Flow is present in the major intracranial arteries. Skull and upper cervical spine: The craniocervical junction is normal. Upper cervical spine is within normal limits. Marrow signal is unremarkable. Sinuses/Orbits: The right maxillary sinus is shrunken without active disease. Minimal mucosal thickening is present in the ethmoid air cells. Mastoid air cells are clear. The globes and orbits are within normal limits. IMPRESSION: 1. Evolving previous infarct with some residual diffusion signal changes in the right centrum semi ovale. There is no significant restricted diffusion. 2. No new infarcts. 3. Stable remote infarcts involving the left caudate, left lentiform nucleus, and left thalamus. 4. 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 Electronically Signed   By: San Morelle M.D.   On: 12/24/2018 14:22   Mr Brain Wo Contrast  Result Date: 12/08/2018 CLINICAL DATA:  Stroke follow-up EXAM: MRI HEAD WITHOUT CONTRAST MRA HEAD WITHOUT CONTRAST TECHNIQUE: Multiplanar, multiecho pulse sequences of the brain and surrounding structures were obtained without intravenous contrast. Angiographic images of the head were obtained using MRA technique without contrast. COMPARISON:  10/12/2017 brain MRI FINDINGS: MRI HEAD FINDINGS Brain: Wedge of restricted diffusion extending from the upper right putamen across the corona radiata  into the right caudate body. Remote left lenticulostriate infarct that is more anteriorly positioned. Small remote high and posterior right frontal cortex infarct. No acute  hemorrhage, hydrocephalus, collection, or masslike finding. Vascular: Arterial findings below. Skull and upper cervical spine: Negative for marrow lesion Sinuses/Orbits: Negative MRA HEAD FINDINGS The carotid arteries are symmetric and diffusely patent. There is a moderate stenosis at the supraclinoid ICA on the left. Advanced distal right M1 segment stenosis which likely also continues into a proximal M2 branch. There is less full MCA branches on the right. The vertebral and basilar arteries are smooth and widely patent. Symmetric robust flow in posterior cerebral arteries. Negative for aneurysm IMPRESSION: 1. 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 2. Remote left basal ganglia infarct. 3. Small remote right frontal cortex infarct. 4. Moderate left supraclinoid ICA narrowing. Electronically Signed   By: Monte Fantasia M.D.   On: 12/08/2018 14:11   US Carotid Bilateral (at [facility] And [facility] Only)  Result Date: 12/09/2018 CLINICAL DATA:  Acute CVA. EXAM: BILATERAL CAROTID DUPLEX ULTRASOUND TECHNIQUE: Pearline Cables scale imaging, color Doppler and duplex ultrasound were performed of bilateral carotid and vertebral arteries in the neck. COMPARISON:  Brain MRI 12/08/2018 FINDINGS: Criteria: Quantification of carotid stenosis is based on velocity parameters that correlate the residual internal carotid diameter with NASCET-based stenosis levels, using the diameter of the distal internal carotid lumen as the denominator for stenosis measurement. The following velocity measurements were obtained: RIGHT ICA: 110 cm/sec CCA: 81 cm/sec SYSTOLIC ICA/CCA RATIO:  1.4 ECA: 185 cm/sec LEFT ICA: 134 cm/sec CCA: 938 cm/sec SYSTOLIC ICA/CCA RATIO:  1.3 ECA: 156 cm/sec RIGHT CAROTID ARTERY: Minimal plaque at the right  carotid bulb and origin of the external carotid artery. External carotid artery is patent with normal waveform. Normal waveforms and velocities in the internal carotid artery. RIGHT VERTEBRAL ARTERY: Antegrade flow and normal waveform in the right vertebral artery. LEFT CAROTID ARTERY: Small amount of plaque in the distal common carotid artery and bulb. External carotid artery is patent with normal waveform. Slightly elevated velocity in the distal internal carotid artery is probably related to tortuosity. No significant plaque or stenosis in the left internal carotid artery. LEFT VERTEBRAL ARTERY: Antegrade flow and normal waveform in the left vertebral artery. IMPRESSION: Minimal plaque in the carotid arteries. Estimated degree of stenosis in the internal carotid arteries is less than 50% bilaterally. Patent vertebral arteries with antegrade flow. Electronically Signed   By: Markus Daft M.D.   On: 12/09/2018 08:09   Ir US Guide Vasc Access Right  Result Date: 12/27/2018 CLINICAL DATA:  Right cervical hemisphere ischemic stroke secondary to high-grade right middle cerebral artery stenosis. Patient failed medical treatment. EXAM: IR ANGIO VERTEBRAL SEL SUBCLAVIAN INNOMINATE UNI LEFT MOD SED; BILATERAL COMMON CAROTID AND INNOMINATE ANGIOGRAPHY; IR ANGIO VERTEBRAL SEL VERTEBRAL UNI RIGHT MOD SED; IR ULTRASOUND GUIDANCE VASC ACCESS RIGHT COMPARISON:  MRI of the brain of 12/24/2018, and MRA of the brain of 12/08/2018. MEDICATIONS: Heparin 1000 units IV; no antibiotic was administered within 1 hour of the procedure. ANESTHESIA/SEDATION: Versed 1 mg IV; Fentanyl 25 mcg IV Moderate Sedation Time:  34 minutes The patient was continuously monitored during the procedure by the interventional radiology nurse under my direct supervision. CONTRAST:  Isovue 300 approximately 60 mL. FLUOROSCOPY TIME:  Fluoroscopy Time: 10 minutes 36 seconds (502 mGy). COMPLICATIONS: None immediate. TECHNIQUE: Informed written consent was  obtained from the patient after a thorough discussion of the procedural risks, benefits and alternatives. All questions were addressed. Maximal Sterile Barrier Technique was utilized including caps, mask, sterile gowns, sterile gloves, sterile drape, hand hygiene and skin antiseptic.  A timeout was performed prior to the initiation of the procedure. The right forearm to the right wrist was prepped and draped in the usual sterile fashion. The right radial artery was identified, and dorsal palmar anastomosis was confirmed. Using ultrasound guidance, trans radial access into the right radial artery was then obtained without difficulty over a 0.018 inch wire, a 4 French radial sheath was then inserted without difficulty. Good aspiration obtained from the side port of the radial sheath. This was then connected to continuous heparinized saline infusion. A mixture of 2.5 mg of verapamil, 200 mcg of nitroglycerin and 2000 units of heparin was then slowly injected in a dilute form without any complications. An arteriogram was then performed through the sheath. Under intermittent fluoroscopy, a 5 French Simmons 2 diagnostic catheter was then advanced over a 0.035 inch Roadrunner guidewire to the right subclavian artery. Selective cannulation was then performed of the right vertebral artery, and also of the right common carotid artery, the left common carotid artery and the left vertebral artery after the Simmons 2 had been formed in the aortic arch region. Following the procedure, a right radial sheath was then removed with the successful application of a wrist band. A radial pulse was palpable distal to the wrist band at the end of the procedure. FINDINGS: The origin of the dominant right vertebral artery demonstrates an approximately 50% stenosis at its origin. The vessel is, otherwise, seen to opacify to the cranial skull base. Wide patency is seen of the right vertebrobasilar junction and the right posterior-inferior  cerebellar artery. The vertebrobasilar junction, the basilar artery, the posterior cerebral arteries, the superior cerebellar arteries and the anterior-inferior cerebellar arteries opacify into the capillary and venous phases. Unopacified blood is seen in the proximal basilar artery from the contralateral vertebral artery. The left common carotid arteriogram demonstrates the origin of the left external carotid artery to be widely patent. Its branches are also patent. The left internal carotid artery at the bulb demonstrates a smooth shallow plaque along the posterior wall without evidence of ulcerations or intraluminal filling defects. More distally the left internal carotid artery is seen to opacify to the cranial skull base. The petrous and the cavernous segments are was widely patent. There is a moderate approximately 50% narrowing of the supraclinoid left ICA. The left middle cerebral artery has a 50% stenosis in the M1 segment. MCA branches, otherwise, demonstrate normal opacification into the capillary and venous phases. Also noted is mild atherosclerotic narrowing of the right anterior cerebral artery A1 segment. More distally, however, the right anterior cerebral artery opacifies into the capillary and venous phases. The right common carotid arteriogram demonstrates the right external carotid artery and its major branches to be widely patent. The right internal carotid artery at the bulb to the cranial skull base demonstrates wide patency. The petrous, cavernous and the supraclinoid segments are widely patent. The right middle cerebral artery demonstrates a diffuse narrowing with tapered severe narrowing of the distal right MCA M1 segment. Patency of the superior and the inferior divisions is noted. The right anterior cerebral artery A1 segment and distally demonstrates wide patency into the capillary and venous phases. The venous phase also demonstrates the presence of bilateral high-riding jugular bulbs.  The left vertebral artery origin demonstrates a severe 90% plus stenosis. More distally the left vertebral artery is seen to opacify to the cranial skull base. Wide patency is seen of the left vertebrobasilar junction, and the left posterior- inferior cerebellar artery. The opacified portions of  the basilar artery on the lateral projection demonstrates wide patency. Unopacified blood is seen in the basilar artery from the contralateral vertebral artery. 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 Approximately 50% stenosis of the left internal carotid artery supraclinoid segment, the left middle cerebral artery M1 segment, and to a lesser degree the left anterior cerebral artery A1 segment. Approximately 50% stenosis of the dominant right vertebral artery at its origin. PLAN: Findings were reviewed with patient, the family and also the referring MD. The patient and the family would like to proceed with endovascular revascularization of symptomatic right middle cerebral artery distally. This will be undertaken as soon as possible. The procedure will be scheduled with anesthesia. Also the patient will be started on aspirin 81 mg a day, and Brilinta 90 mg b.i.d. Electronically Signed   By: Luanne Bras M.D.   On: 12/26/2018 09:00   Dg Chest Portable 1 View  Result Date: 12/24/2018 CLINICAL DATA:  Possible stroke. EXAM: PORTABLE CHEST 1 VIEW COMPARISON:  None. FINDINGS: Monitoring leads overlie the patient. Normal cardiac and mediastinal contours. Calcified granuloma right lower lung. No pleural effusion or pneumothorax. Thoracic spine degenerative changes. IMPRESSION: No acute cardiopulmonary process. Electronically Signed   By: Lovey Newcomer M.D.   On: 12/24/2018 08:19   Mr Jodene Nam Head/brain TD Cm  Result Date: 12/08/2018 CLINICAL DATA:  Stroke follow-up EXAM: MRI HEAD WITHOUT CONTRAST MRA HEAD WITHOUT CONTRAST TECHNIQUE: Multiplanar, multiecho pulse  sequences of the brain and surrounding structures were obtained without intravenous contrast. Angiographic images of the head were obtained using MRA technique without contrast. COMPARISON:  10/12/2017 brain MRI FINDINGS: MRI HEAD FINDINGS Brain: Wedge of restricted diffusion extending from the upper right putamen across the corona radiata into the right caudate body. Remote left lenticulostriate infarct that is more anteriorly positioned. Small remote high and posterior right frontal cortex infarct. No acute hemorrhage, hydrocephalus, collection, or masslike finding. Vascular: Arterial findings below. Skull and upper cervical spine: Negative for marrow lesion Sinuses/Orbits: Negative MRA HEAD FINDINGS The carotid arteries are symmetric and diffusely patent. There is a moderate stenosis at the supraclinoid ICA on the left. Advanced distal right M1 segment stenosis which likely also continues into a proximal M2 branch. There is less full MCA branches on the right. The vertebral and basilar arteries are smooth and widely patent. Symmetric robust flow in posterior cerebral arteries. Negative for aneurysm IMPRESSION: 1. 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 2. Remote left basal ganglia infarct. 3. Small remote right frontal cortex infarct. 4. Moderate left supraclinoid ICA narrowing. Electronically Signed   By: Monte Fantasia M.D.   On: 12/08/2018 14:11   Ct Head Code Stroke Wo Contrast  Result Date: 12/24/2018 CLINICAL DATA:  Code stroke. New onset of left-sided weakness and right-sided gaze. Fall. EXAM: CT HEAD WITHOUT CONTRAST TECHNIQUE: Contiguous axial images were obtained from the base of the skull through the vertex without intravenous contrast. COMPARISON:  MRI brain 12/08/2018. FINDINGS: Brain: The subacute infarct involving the posterior right lentiform nucleus and centrum semi ovale is again noted. No new infarct is present. A remote lacunar  infarct involving the left basal ganglia is stable. Periventricular Nyce matter disease is stable. Basal ganglia are otherwise intact. Internal capsule is otherwise normal. No focal cortical abnormalities are present. The ventricles are of normal size. No significant extraaxial fluid collection is present. The brainstem and cerebellum are within normal limits. Vascular: Atherosclerotic calcifications are present  within the cavernous internal carotid arteries. There is no hyperdense vessel. Skull: Calvarium is intact. No focal lytic or blastic lesions are present. Sinuses/Orbits: The right maxillary sinus is shrunken. There is no active disease. Paranasal sinuses are otherwise clear. Mastoid air cells are clear bilaterally. Globes and orbits are within normal limits. ASPECTS [facility] Stroke Program Early CT Score) - Ganglionic level infarction (caudate, lentiform nuclei, internal capsule, insula, M1-M3 cortex): 7/7 - Supraganglionic infarction (M4-M6 cortex): 3/3 Total score (0-10 with 10 being normal): 10/10 IMPRESSION: 1. No acute intracranial abnormality. 2. Recent infarct involving the posterior right lentiform nucleus and centrum semi ovale is again noted. There is no evidence for expansion of this infarct. 3. Atherosclerosis without hyperdense vessel. 4. ASPECTS is 10/10 The above was relayed via text pager to Dr. Lorraine Lax on 12/24/2018 at 08:02 . Electronically Signed   By: San Morelle M.D.   On: 12/24/2018 08:09   Ir Angio Intra Extracran Sel Com Carotid Innominate Bilat Mod Sed  Result Date: 12/27/2018 CLINICAL DATA:  Right cervical hemisphere ischemic stroke secondary to high-grade right middle cerebral artery stenosis. Patient failed medical treatment. EXAM: IR ANGIO VERTEBRAL SEL SUBCLAVIAN INNOMINATE UNI LEFT MOD SED; BILATERAL COMMON CAROTID AND INNOMINATE ANGIOGRAPHY; IR ANGIO VERTEBRAL SEL VERTEBRAL UNI RIGHT MOD SED; IR ULTRASOUND GUIDANCE VASC ACCESS RIGHT COMPARISON:  MRI of the brain of  12/24/2018, and MRA of the brain of 12/08/2018. MEDICATIONS: Heparin 1000 units IV; no antibiotic was administered within 1 hour of the procedure. ANESTHESIA/SEDATION: Versed 1 mg IV; Fentanyl 25 mcg IV Moderate Sedation Time:  34 minutes The patient was continuously monitored during the procedure by the interventional radiology nurse under my direct supervision. CONTRAST:  Isovue 300 approximately 60 mL. FLUOROSCOPY TIME:  Fluoroscopy Time: 10 minutes 36 seconds (502 mGy). COMPLICATIONS: None immediate. TECHNIQUE: Informed written consent was obtained from the patient after a thorough discussion of the procedural risks, benefits and alternatives. All questions were addressed. Maximal Sterile Barrier Technique was utilized including caps, mask, sterile gowns, sterile gloves, sterile drape, hand hygiene and skin antiseptic. A timeout was performed prior to the initiation of the procedure. The right forearm to the right wrist was prepped and draped in the usual sterile fashion. The right radial artery was identified, and dorsal palmar anastomosis was confirmed. Using ultrasound guidance, trans radial access into the right radial artery was then obtained without difficulty over a 0.018 inch wire, a 4 French radial sheath was then inserted without difficulty. Good aspiration obtained from the side port of the radial sheath. This was then connected to continuous heparinized saline infusion. A mixture of 2.5 mg of verapamil, 200 mcg of nitroglycerin and 2000 units of heparin was then slowly injected in a dilute form without any complications. An arteriogram was then performed through the sheath. Under intermittent fluoroscopy, a 5 French Simmons 2 diagnostic catheter was then advanced over a 0.035 inch Roadrunner guidewire to the right subclavian artery. Selective cannulation was then performed of the right vertebral artery, and also of the right common carotid artery, the left common carotid artery and the left vertebral  artery after the Simmons 2 had been formed in the aortic arch region. Following the procedure, a right radial sheath was then removed with the successful application of a wrist band. A radial pulse was palpable distal to the wrist band at the end of the procedure. FINDINGS: The origin of the dominant right vertebral artery demonstrates an approximately 50% stenosis at its origin. The vessel is, otherwise, seen to  opacify to the cranial skull base. Wide patency is seen of the right vertebrobasilar junction and the right posterior-inferior cerebellar artery. The vertebrobasilar junction, the basilar artery, the posterior cerebral arteries, the superior cerebellar arteries and the anterior-inferior cerebellar arteries opacify into the capillary and venous phases. Unopacified blood is seen in the proximal basilar artery from the contralateral vertebral artery. The left common carotid arteriogram demonstrates the origin of the left external carotid artery to be widely patent. Its branches are also patent. The left internal carotid artery at the bulb demonstrates a smooth shallow plaque along the posterior wall without evidence of ulcerations or intraluminal filling defects. More distally the left internal carotid artery is seen to opacify to the cranial skull base. The petrous and the cavernous segments are was widely patent. There is a moderate approximately 50% narrowing of the supraclinoid left ICA. The left middle cerebral artery has a 50% stenosis in the M1 segment. MCA branches, otherwise, demonstrate normal opacification into the capillary and venous phases. Also noted is mild atherosclerotic narrowing of the right anterior cerebral artery A1 segment. More distally, however, the right anterior cerebral artery opacifies into the capillary and venous phases. The right common carotid arteriogram demonstrates the right external carotid artery and its major branches to be widely patent. The right internal carotid  artery at the bulb to the cranial skull base demonstrates wide patency. The petrous, cavernous and the supraclinoid segments are widely patent. The right middle cerebral artery demonstrates a diffuse narrowing with tapered severe narrowing of the distal right MCA M1 segment. Patency of the superior and the inferior divisions is noted. The right anterior cerebral artery A1 segment and distally demonstrates wide patency into the capillary and venous phases. The venous phase also demonstrates the presence of bilateral high-riding jugular bulbs. The left vertebral artery origin demonstrates a severe 90% plus stenosis. More distally the left vertebral artery is seen to opacify to the cranial skull base. Wide patency is seen of the left vertebrobasilar junction, and the left posterior- inferior cerebellar artery. The opacified portions of the basilar artery on the lateral projection demonstrates wide patency. Unopacified blood is seen in the basilar artery from the contralateral vertebral artery. 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 Approximately 50% stenosis of the left internal carotid artery supraclinoid segment, the left middle cerebral artery M1 segment, and to a lesser degree the left anterior cerebral artery A1 segment. Approximately 50% stenosis of the dominant right vertebral artery at its origin. PLAN: Findings were reviewed with patient, the family and also the referring MD. The patient and the family would like to proceed with endovascular revascularization of symptomatic right middle cerebral artery distally. This will be undertaken as soon as possible. The procedure will be scheduled with anesthesia. Also the patient will be started on aspirin 81 mg a day, and Brilinta 90 mg b.i.d. Electronically Signed   By: Luanne Bras M.D.   On: 12/26/2018 09:00   Ir Angio Vertebral Sel Subclavian Innominate Uni L Mod Sed  Result Date:  12/27/2018 CLINICAL DATA:  Right cervical hemisphere ischemic stroke secondary to high-grade right middle cerebral artery stenosis. Patient failed medical treatment. EXAM: IR ANGIO VERTEBRAL SEL SUBCLAVIAN INNOMINATE UNI LEFT MOD SED; BILATERAL COMMON CAROTID AND INNOMINATE ANGIOGRAPHY; IR ANGIO VERTEBRAL SEL VERTEBRAL UNI RIGHT MOD SED; IR ULTRASOUND GUIDANCE VASC ACCESS RIGHT COMPARISON:  MRI of the brain of 12/24/2018, and MRA of the brain of 12/08/2018. MEDICATIONS: Heparin 1000 units IV; no antibiotic  was administered within 1 hour of the procedure. ANESTHESIA/SEDATION: Versed 1 mg IV; Fentanyl 25 mcg IV Moderate Sedation Time:  34 minutes The patient was continuously monitored during the procedure by the interventional radiology nurse under my direct supervision. CONTRAST:  Isovue 300 approximately 60 mL. FLUOROSCOPY TIME:  Fluoroscopy Time: 10 minutes 36 seconds (502 mGy). COMPLICATIONS: None immediate. TECHNIQUE: Informed written consent was obtained from the patient after a thorough discussion of the procedural risks, benefits and alternatives. All questions were addressed. Maximal Sterile Barrier Technique was utilized including caps, mask, sterile gowns, sterile gloves, sterile drape, hand hygiene and skin antiseptic. A timeout was performed prior to the initiation of the procedure. The right forearm to the right wrist was prepped and draped in the usual sterile fashion. The right radial artery was identified, and dorsal palmar anastomosis was confirmed. Using ultrasound guidance, trans radial access into the right radial artery was then obtained without difficulty over a 0.018 inch wire, a 4 French radial sheath was then inserted without difficulty. Good aspiration obtained from the side port of the radial sheath. This was then connected to continuous heparinized saline infusion. A mixture of 2.5 mg of verapamil, 200 mcg of nitroglycerin and 2000 units of heparin was then slowly injected in a dilute form  without any complications. An arteriogram was then performed through the sheath. Under intermittent fluoroscopy, a 5 French Simmons 2 diagnostic catheter was then advanced over a 0.035 inch Roadrunner guidewire to the right subclavian artery. Selective cannulation was then performed of the right vertebral artery, and also of the right common carotid artery, the left common carotid artery and the left vertebral artery after the Simmons 2 had been formed in the aortic arch region. Following the procedure, a right radial sheath was then removed with the successful application of a wrist band. A radial pulse was palpable distal to the wrist band at the end of the procedure. FINDINGS: The origin of the dominant right vertebral artery demonstrates an approximately 50% stenosis at its origin. The vessel is, otherwise, seen to opacify to the cranial skull base. Wide patency is seen of the right vertebrobasilar junction and the right posterior-inferior cerebellar artery. The vertebrobasilar junction, the basilar artery, the posterior cerebral arteries, the superior cerebellar arteries and the anterior-inferior cerebellar arteries opacify into the capillary and venous phases. Unopacified blood is seen in the proximal basilar artery from the contralateral vertebral artery. The left common carotid arteriogram demonstrates the origin of the left external carotid artery to be widely patent. Its branches are also patent. The left internal carotid artery at the bulb demonstrates a smooth shallow plaque along the posterior wall without evidence of ulcerations or intraluminal filling defects. More distally the left internal carotid artery is seen to opacify to the cranial skull base. The petrous and the cavernous segments are was widely patent. There is a moderate approximately 50% narrowing of the supraclinoid left ICA. The left middle cerebral artery has a 50% stenosis in the M1 segment. MCA branches, otherwise, demonstrate normal  opacification into the capillary and venous phases. Also noted is mild atherosclerotic narrowing of the right anterior cerebral artery A1 segment. More distally, however, the right anterior cerebral artery opacifies into the capillary and venous phases. The right common carotid arteriogram demonstrates the right external carotid artery and its major branches to be widely patent. The right internal carotid artery at the bulb to the cranial skull base demonstrates wide patency. The petrous, cavernous and the supraclinoid segments are widely patent. The  right middle cerebral artery demonstrates a diffuse narrowing with tapered severe narrowing of the distal right MCA M1 segment. Patency of the superior and the inferior divisions is noted. The right anterior cerebral artery A1 segment and distally demonstrates wide patency into the capillary and venous phases. The venous phase also demonstrates the presence of bilateral high-riding jugular bulbs. The left vertebral artery origin demonstrates a severe 90% plus stenosis. More distally the left vertebral artery is seen to opacify to the cranial skull base. Wide patency is seen of the left vertebrobasilar junction, and the left posterior- inferior cerebellar artery. The opacified portions of the basilar artery on the lateral projection demonstrates wide patency. Unopacified blood is seen in the basilar artery from the contralateral vertebral artery. 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 Approximately 50% stenosis of the left internal carotid artery supraclinoid segment, the left middle cerebral artery M1 segment, and to a lesser degree the left anterior cerebral artery A1 segment. Approximately 50% stenosis of the dominant right vertebral artery at its origin. PLAN: Findings were reviewed with patient, the family and also the referring MD. The patient and the family would like to proceed with endovascular  revascularization of symptomatic right middle cerebral artery distally. This will be undertaken as soon as possible. The procedure will be scheduled with anesthesia. Also the patient will be started on aspirin 81 mg a day, and Brilinta 90 mg b.i.d. Electronically Signed   By: Luanne Bras M.D.   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 Patient failed medical treatment. EXAM: IR ANGIO VERTEBRAL SEL SUBCLAVIAN INNOMINATE UNI LEFT MOD SED; BILATERAL COMMON CAROTID AND INNOMINATE ANGIOGRAPHY; IR ANGIO VERTEBRAL SEL VERTEBRAL UNI RIGHT MOD SED; IR ULTRASOUND GUIDANCE VASC ACCESS RIGHT COMPARISON:  MRI of the brain of 12/24/2018, and MRA of the brain of 12/08/2018. MEDICATIONS: Heparin 1000 units IV; no antibiotic was administered within 1 hour of the procedure. ANESTHESIA/SEDATION: Versed 1 mg IV; Fentanyl 25 mcg IV Moderate Sedation Time:  34 minutes The patient was continuously monitored during the procedure by the interventional radiology nurse under my direct supervision. CONTRAST:  Isovue 300 approximately 60 mL. FLUOROSCOPY TIME:  Fluoroscopy Time: 10 minutes 36 seconds (502 mGy). COMPLICATIONS: None immediate. TECHNIQUE: Informed written consent was obtained from the patient after a thorough discussion of the procedural risks, benefits and alternatives. All questions were addressed. Maximal Sterile Barrier Technique was utilized including caps, mask, sterile gowns, sterile gloves, sterile drape, hand hygiene and skin antiseptic. A timeout was performed prior to the initiation of the procedure. The right forearm to the right wrist was prepped and draped in the usual sterile fashion. The right radial artery was identified, and dorsal palmar anastomosis was confirmed. Using ultrasound guidance, trans radial access into the right radial  artery was then obtained without difficulty over a 0.018 inch wire, a 4 French radial sheath was then inserted without difficulty. Good aspiration obtained from the side port of the radial sheath. This was then connected to continuous heparinized saline infusion. A mixture of 2.5 mg of verapamil, 200 mcg of nitroglycerin and 2000 units of heparin was then slowly injected in a dilute form without any complications. An arteriogram was then performed through the sheath. Under intermittent fluoroscopy, a 5 French Simmons 2 diagnostic catheter was then advanced over a 0.035 inch Roadrunner  guidewire to the right subclavian artery. Selective cannulation was then performed of the right vertebral artery, and also of the right common carotid artery, the left common carotid artery and the left vertebral artery after the Simmons 2 had been formed in the aortic arch region. Following the procedure, a right radial sheath was then removed with the successful application of a wrist band. A radial pulse was palpable distal to the wrist band at the end of the procedure. FINDINGS: The origin of the dominant right vertebral artery demonstrates an approximately 50% stenosis at its origin. The vessel is, otherwise, seen to opacify to the cranial skull base. Wide patency is seen of the right vertebrobasilar junction and the right posterior-inferior cerebellar artery. The vertebrobasilar junction, the basilar artery, the posterior cerebral arteries, the superior cerebellar arteries and the anterior-inferior cerebellar arteries opacify into the capillary and venous phases. Unopacified blood is seen in the proximal basilar artery from the contralateral vertebral artery. The left common carotid arteriogram demonstrates the origin of the left external carotid artery to be widely patent. Its branches are also patent. The left internal carotid artery at the bulb demonstrates a smooth shallow plaque along the posterior wall without evidence of  ulcerations or intraluminal filling defects. More distally the left internal carotid artery is seen to opacify to the cranial skull base. The petrous and the cavernous segments are was widely patent. There is a moderate approximately 50% narrowing of the supraclinoid left ICA. The left middle cerebral artery has a 50% stenosis in the M1 segment. MCA branches, otherwise, demonstrate normal opacification into the capillary and venous phases. Also noted is mild atherosclerotic narrowing of the right anterior cerebral artery A1 segment. More distally, however, the right anterior cerebral artery opacifies into the capillary and venous phases. The right common carotid arteriogram demonstrates the right external carotid artery and its major branches to be widely patent. The right internal carotid artery at the bulb to the cranial skull base demonstrates wide patency. The petrous, cavernous and the supraclinoid segments are widely patent. The right middle cerebral artery demonstrates a diffuse narrowing with tapered severe narrowing of the distal right MCA M1 segment. Patency of the superior and the inferior divisions is noted. The right anterior cerebral artery A1 segment and distally demonstrates wide patency into the capillary and venous phases. The venous phase also demonstrates the presence of bilateral high-riding jugular bulbs. The left vertebral artery origin demonstrates a severe 90% plus stenosis. More distally the left vertebral artery is seen to opacify to the cranial skull base. Wide patency is seen of the left vertebrobasilar junction, and the left posterior- inferior cerebellar artery. The opacified portions of the basilar artery on the lateral projection demonstrates wide patency. Unopacified blood is seen in the basilar artery from the contralateral vertebral artery. 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 Approximately 50% stenosis of the left  internal carotid artery supraclinoid segment, the left middle cerebral artery M1 segment, and to a lesser degree the left anterior cerebral artery A1 segment. Approximately 50% stenosis of the dominant right vertebral artery at its origin. PLAN: Findings were reviewed with patient, the family and also the referring MD. The patient and the family would like to proceed with endovascular revascularization of symptomatic right middle cerebral artery distally. This will be undertaken as soon as possible. The procedure will be scheduled with anesthesia. Also the patient will be started on aspirin 81 mg a day, and Brilinta 90 mg b.i.d. Electronically  Signed   By: Luanne Bras M.D.   On: 12/26/2018 09:00    Treatments: Stent assisted angioplasty of patient's right middle cerebral artery stenosis.  Discharge Exam: Blood pressure (!) 124/56, pulse 80, temperature 97.6 F (36.4 C), temperature source Oral, resp. rate 15, height 5\' 5"  (1.651 m), weight 120 lb 9.5 oz (54.7 kg), SpO2 100 %. Physical Exam Vitals signs and nursing note reviewed.  Constitutional:      General: She is not in acute distress.    Appearance: Normal appearance.  Pulmonary:     Effort: Pulmonary effort is normal. No respiratory distress.  Skin:    General: Skin is warm and dry.     Comments: Right groin incision soft without active bleeding or hematoma.  Neurological:     Mental Status: She is alert.     Comments: Alert, awake, and oriented x3. Speech and comprehension intact. PERRL bilaterally. EOMs intact bilaterally without nystagmus or subjective diplopia. Visual fields not assessed. Mild left facial droop, residual from prior CVA. Tongue midline. Motor power symmetric proportional to effort. No pronator drift. Fine motor and coordination intact and symmetric. Gait not assessed. Romberg not assessed. Heel to toe not assessed. Distal pulses 1+ bilaterally.  Psychiatric:        Mood and Affect: Mood normal.         Behavior: Behavior normal.        Thought Content: Thought content normal.        Judgment: Judgment normal.     Disposition: Discharge disposition: 01-Home or Self Care       Discharge Instructions    Call MD for:  difficulty breathing, headache or visual disturbances   Complete by:  As directed    Call MD for:  extreme fatigue   Complete by:  As directed    Call MD for:  hives   Complete by:  As directed    Call MD for:  persistant dizziness or light-headedness   Complete by:  As directed    Call MD for:  persistant nausea and vomiting   Complete by:  As directed    Call MD for:  redness, tenderness, or signs of infection (pain, swelling, redness, odor or green/yellow discharge around incision site)   Complete by:  As directed    Call MD for:  severe uncontrolled pain   Complete by:  As directed    Call MD for:  temperature >100.4   Complete by:  As directed    Diet - low sodium heart healthy   Complete by:  As directed    Discharge instructions   Complete by:  As directed    Continue taking Brilinta 90 mg twice daily. Continue taking Aspirin 81 mg once daily. No bending, stooping, or lifting more than 10 pounds for 2 weeks. No driving self for 2 weeks. Stay hydrated by drinking plenty of water.   Increase activity slowly   Complete by:  As directed    No dressing needed   Complete by:  As directed      Allergies as of 01/05/2019      Reactions   Other Palpitations   SINUTAB   Sulfa Antibiotics Other (See Comments)   Tongue turned black   Mandol [cefamandole] Rash      Medication List    TAKE these medications   aspirin 81 MG EC tablet Take 1 tablet (81 mg total) by mouth daily.   atorvastatin 40 MG tablet Commonly known as:  LIPITOR Take  1 tablet (40 mg total) by mouth daily at 6 PM.   calcium carbonate 600 MG tablet Commonly known as:  OS-CAL Take 1 tablet by mouth daily.   clonazePAM 0.5 MG tablet Commonly known as:  KLONOPIN Take 1 tablet  by mouth daily as needed.   co-enzyme Q-10 30 MG capsule Take 30 mg by mouth daily.   famotidine-calcium carbonate-magnesium hydroxide 10-800-165 MG chewable tablet Commonly known as:  PEPCID COMPLETE Chew 1 tablet by mouth daily as needed.   GAVISCON PO Take 5 mLs by mouth daily as needed.   levothyroxine 75 MCG tablet Commonly known as:  SYNTHROID, LEVOTHROID Take 1 tablet by mouth daily.   levothyroxine 50 MCG tablet Commonly known as:  SYNTHROID, LEVOTHROID Take 1 tablet by mouth daily.   multivitamin capsule Take 1 capsule by mouth daily.   oseltamivir 75 MG capsule Commonly known as:  TAMIFLU Take 1 capsule (75 mg total) by mouth 2 (two) times daily. Take one 75 mg capsule tonight and then continue to take 75 mg twice a day for 3 days   ticagrelor 90 MG Tabs tablet Commonly known as:  BRILINTA Take 1 tablet (90 mg total) by mouth 2 (two) times daily.   vitamin E 400 UNIT capsule Take 400 Units by mouth every other day.      Follow-up Information    Luanne Bras, MD Follow up in 2 week(s).   Specialties:  Interventional Radiology, Radiology Why:  Please follow-up with Dr. Estanislado Pandy in clinic 2 weeks after discharge. Contact information: [address] [place] [address] [phone]            Electronically Signed: Earley Abide, PA-C 01/05/2019, 9:54 AM   I have spent Less Than 30 Minutes discharging [place].

## 2019-01-05 NOTE — Progress Notes (Signed)
Per Dr Estanislado Pandy patient is cleared to be discharged at this time.  All discharge paperwork was provided to patient.  Hiram Gash RN

## 2019-01-08 ENCOUNTER — Other Ambulatory Visit (HOSPITAL_COMMUNITY): Payer: Self-pay | Admitting: Interventional Radiology

## 2019-01-08 ENCOUNTER — Encounter (HOSPITAL_COMMUNITY): Payer: Self-pay | Admitting: Interventional Radiology

## 2019-01-08 ENCOUNTER — Ambulatory Visit: Payer: Medicare Other

## 2019-01-08 DIAGNOSIS — I771 Stricture of artery: Secondary | ICD-10-CM

## 2019-01-10 ENCOUNTER — Other Ambulatory Visit: Payer: Self-pay | Admitting: *Deleted

## 2019-01-10 ENCOUNTER — Ambulatory Visit: Payer: Medicare Other

## 2019-01-10 NOTE — Patient Outreach (Signed)
[facility]) Care Management  01/10/2019  Shannon [name] September 28, 1946 [id number]   EMMI-stroke  RED ON EMMI ALERT Day # 13 Date: Tuesday 01/09/2019 1000 Red Alert Reason:Questions/problems with meds? Yes Went to follow-up appointment? No  Insurance: Medicare and blue cross and blue shield supplement [facility] admissions x 3  ED visits x 3 in the last 6 months    Outreach attempt #1 successful to the home number  Patient is able to verify HIPAA Shannon [name] Management RN reviewed and addressed red alert with patient EMMI Shannon [name] reports she understood the follow up EMMI question to ask her is she had  a follow up appointment in the next few days Vs if she went to her follow up appointment. She did answer the question correct as no. She reports her follow up appointment is next Thursday 01/18/2019, therefore she has not been to a follow up appointment yet. She reports the answer should be that she has She states the answer for questions/problems with meds should be no She denies questions/problems with meds  Social:Shannon [name] is married and has assist from her husband and female family memberfor all care and transportation to medical appointments   Conditions:CVA, HTN, TIA, hypothyroidism, influenza B   Medications:denies concerns with taking medications as prescribed, affording medications, side effects of medications and questions about medications    Appointments:being seen by PT/OT Dr Chrissie Noa to be sen 01/17/2019     Advance Directives:Denies need for assist with advance directivesShe has a living will   Consent: [facility] RN CM reviewed [facility] services with patient. Patient gave verbal consent for services.   Advised patient that there will be further automated EMMI- post discharge calls to assess how the patient is doing following the recent hospitalization Advised the patient that another call may be received from a nurse if any of their responses were  abnormal. Patient voiced understanding and was appreciative of f/u call.   Plan: [facility] RN CM will close case at this time as patient has been assessed and no needs identified/needs resolved.   Pt encouraged to return a call to [facility] RN CM prn  [name], RN, BSN, [place] Coordinator Office number [phone] Mobile number [phone]  Main [facility] number [phone] Fax number [phone]

## 2019-01-15 ENCOUNTER — Ambulatory Visit: Payer: Medicare Other

## 2019-01-17 ENCOUNTER — Ambulatory Visit: Payer: Medicare Other

## 2019-01-18 ENCOUNTER — Ambulatory Visit (HOSPITAL_COMMUNITY)
Admission: RE | Admit: 2019-01-18 | Discharge: 2019-01-18 | Disposition: A | Discharge status: Home / Self Care | Payer: Medicare Other | Source: Ambulatory Visit | Attending: Interventional Radiology | Admitting: Interventional Radiology

## 2019-01-18 DIAGNOSIS — I771 Stricture of artery: Secondary | ICD-10-CM

## 2019-01-18 NOTE — Progress Notes (Signed)
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 female with recent CVA in December who underwent cerebral intervention 01/04/19.  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  She returns to clinic today for 2 week follow-up.  She presents in her usual state of health.  She denies headache, blurry vision, weakness, numbness, or tingling.  She does complain of some soreness in her groin with standing from a seating position and climbing stairs.  She has been taking her Brilinta BID and aspirin daily as prescribed. She reports she has recently completed therapies and is doing well at home.   Past Medical History:  Diagnosis Date  . 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 Cancer ([place])    skin  . Complication of anesthesia    " I had difficulty coming out of it"  . Family history of adverse reaction to anesthesia    " My daughter has a problem coming ou to it"  . GERD (gastroesophageal reflux disease)   . Hypertension   . Stroke ([place])   . Thyroid disease     Past Surgical History:  Procedure Laterality Date  . ABDOMINAL HYSTERECTOMY    . APPENDECTOMY    . CARPAL TUNNEL RELEASE Bilateral   . COLONOSCOPY    . DILATION AND CURETTAGE OF UTERUS    . IR ANGIO INTRA EXTRACRAN SEL COM CAROTID INNOMINATE BILAT MOD SED  12/25/2018  . IR ANGIO VERTEBRAL SEL SUBCLAVIAN INNOMINATE UNI L MOD SED  12/25/2018  . IR ANGIO VERTEBRAL SEL SUBCLAVIAN INNOMINATE UNI R MOD SED  01/04/2019  . IR ANGIO VERTEBRAL SEL VERTEBRAL UNI R MOD SED  12/25/2018  . IR CT HEAD LTD  01/04/2019    . IR INTRA CRAN STENT  01/04/2019  . IR US GUIDE VASC ACCESS RIGHT  12/25/2018  . OVARIAN CYST REMOVAL    . RADIOLOGY WITH ANESTHESIA N/A 01/04/2019   Procedure: RMCA angioplasty with possible stenting;  Surgeon: Luanne Bras, MD;  Location: [facility];  Service: Radiology;  Laterality: N/A;    Allergies: Other; Sulfa antibiotics; and Mandol [cefamandole]  Medications: Prior to Admission medications   Medication Sig Start Date End Date Taking? Authorizing Provider  Alum Hydroxide-Mag Carbonate (GAVISCON PO) Take 5 mLs by mouth daily as needed.    [provider]  aspirin EC 81 MG EC tablet Take 1 tablet (81 mg total) by mouth daily. 12/10/18   Mayo, Pete Pelt, MD  atorvastatin (LIPITOR) 40 MG tablet Take 1 tablet (40 mg total) by mouth daily at 6 PM. 12/09/18   Mayo, Pete Pelt, MD  calcium carbonate (OS-CAL) 600 MG tablet Take 1 tablet by mouth daily.    [provider]  clonazePAM (KLONOPIN) 0.5 MG tablet Take 1 tablet by mouth daily as needed.    [provider]  co-enzyme Q-10 30 MG capsule Take 30 mg by mouth daily.    [provider]  famotidine-calcium carbonate-magnesium hydroxide (PEPCID COMPLETE) 10-800-165 MG chewable tablet Chew 1 tablet by mouth daily as needed.    [provider]  levothyroxine (SYNTHROID, LEVOTHROID) 50 MCG tablet Take 1 tablet by  mouth daily. 04/24/12   [provider]  levothyroxine (SYNTHROID, LEVOTHROID) 75 MCG tablet Take 1 tablet by mouth daily. 04/24/12   [provider]  Multiple Vitamin (MULTIVITAMIN) capsule Take 1 capsule by mouth daily. 04/24/12   [provider]  oseltamivir (TAMIFLU) 75 MG capsule Take 1 capsule (75 mg total) by mouth 2 (two) times daily. Take one 75 mg capsule tonight and then continue to take 75 mg twice a day for 3 days 12/27/18   Rehman, Areeg N, DO  ticagrelor (BRILINTA) 90 MG TABS tablet Take 1 tablet (90 mg total) by mouth 2 (two) times daily. 12/27/18    Rehman, Areeg N, DO  vitamin E 400 UNIT capsule Take 400 Units by mouth every other day.     [provider]     Family History  Problem Relation Age of Onset  . CVA Mother   . Heart disease Mother   . Peripheral Artery Disease Father     Social History   Socioeconomic History  . Marital status: Married    Spouse name: Not on file  . Number of children: Not on file  . Years of education: Not on file  . Highest education level: Not on file  Occupational History  . Occupation: retired  Scientific laboratory technician  . Financial resource strain: Not on file  . Food insecurity:    Worry: Not on file    Inability: Not on file  . Transportation needs:    Medical: Not on file    Non-medical: Not on file  Tobacco Use  . Smoking status: Never Smoker  . Smokeless tobacco: Never Used  Substance and Sexual Activity  . Alcohol use: Never    Frequency: Never  . Drug use: Never  . Sexual activity: Yes  Lifestyle  . Physical activity:    Days per week: Not on file    Minutes per session: Not on file  . Stress: Not on file  Relationships  . Social connections:    Talks on phone: Not on file    Gets together: Not on file    Attends religious service: Not on file    Active member of club or organization: Not on file    Attends meetings of clubs or organizations: Not on file    Relationship status: Not on file  Other Topics Concern  . Not on file  Social History Narrative  . Not on file     Vital Signs: There were no vitals taken for this visit.  Physical Exam Vitals signs and nursing note reviewed.  Constitutional:      General: She is not in acute distress. Skin:    General: Skin is warm and dry.     Comments: Groin site intact.  Scar over incision site.  Hardened area underneath puncture site likely from scar tissue.  Mild tenderness to palpation.   Neurological:     General: No focal deficit present.     Mental Status: She is alert and oriented to person, place, and time.      Comments: No facial droop noted today.  Tongue midline.  Speech clear. Strength 5/5 in bilateral upper and lower extremities.      Imaging: No results found.  Labs:  CBC: Recent Labs    12/25/18 0650 12/26/18 0454 01/04/19 0619 01/04/19 1136 01/05/19 0457  WBC 3.5* 2.8* 4.7  --  8.6  HGB 12.8 12.0 13.9 8.5* 11.9*  HCT 37.7 36.0 42.2 25.0* 35.1*  PLT 178  147* 288  --  279    COAGS: Recent Labs    12/24/18 0750 01/04/19 0619  INR 1.07 0.96  APTT 22*  --     BMP: Recent Labs    12/25/18 0650 12/26/18 0454 01/04/19 0619 01/04/19 1136 01/05/19 0457  NA 137 136 137 141 139  K 3.9 3.6 3.7 3.5 3.7  CL 103 104 104  --  112*  CO2 25 25 23   --  20*  GLUCOSE 96 96 97  --  121*  BUN 14 13 14   --  7*  CALCIUM 9.0 8.7* 9.7  --  8.9  CREATININE 0.98 0.71 0.77  --  0.66  GFRNONAA 58* >60 >60  --  >60  GFRAA >60 >60 >60  --  >60    LIVER FUNCTION TESTS: Recent Labs    12/08/18 0929 12/24/18 0750  BILITOT 0.5 0.9  AST 24 26  ALT 21 21  ALKPHOS 73 63  PROT 7.1 7.0  ALBUMIN 4.3 4.1    Assessment: R MCA stenosis Patient is s/p revascularization of symptomatic high grade stenosis of the R MCA distal M1 segment with stent assisted angioplasty on 01/04/19.  She presents today for routine follow-up. She is recovering well from this procedure and has been able to return to many of her usual activities without symptoms.  She has recently completed rehab therapies with good response.  She does complain of groin soreness today.  Physical exam today reveals a small area of tenderness beneath the puncture site most consistent with scar tissue, however Dr. Estanislado Pandy would like to pursue US imaging to confirm.  Patient will be scheduled for a CT Angiogram in 4 months to assess stent as well as review other areas of concern noted by Dr. Estanislado Pandy.  Patient brings a blood pressure log with her today which shows excellent readings regarding her BP.  Encouraged patient to schedule  an appointment with her PCP to review medication.  She should continue aspirin 81 mg daily and Brilinta 90mg  BID at this time.  Care plan discussed with patient and husband who are agreeable.   Signed: Docia Barrier, PA 01/18/2019, 2:04 PM     I spent a total of  15 Minutes in face to face in clinical consultation, greater than 50% of which was counseling/coordinating care for middle cerebral artery stenosis.

## 2019-01-22 ENCOUNTER — Ambulatory Visit: Payer: Medicare Other

## 2019-01-23 ENCOUNTER — Other Ambulatory Visit (HOSPITAL_COMMUNITY): Payer: Self-pay | Admitting: Interventional Radiology

## 2019-01-24 ENCOUNTER — Ambulatory Visit: Payer: Medicare Other | Admitting: Physical Therapy

## 2019-01-24 ENCOUNTER — Encounter: Payer: Self-pay | Admitting: Physical Therapy

## 2019-01-24 ENCOUNTER — Other Ambulatory Visit: Payer: Self-pay | Admitting: *Deleted

## 2019-01-24 ENCOUNTER — Other Ambulatory Visit: Payer: Self-pay

## 2019-01-24 ENCOUNTER — Ambulatory Visit: Payer: Medicare Other | Attending: Family Medicine | Admitting: Speech Pathology

## 2019-01-24 ENCOUNTER — Ambulatory Visit: Payer: Medicare Other

## 2019-01-24 DIAGNOSIS — R2681 Unsteadiness on feet: Secondary | ICD-10-CM | POA: Diagnosis present

## 2019-01-24 DIAGNOSIS — M6281 Muscle weakness (generalized): Secondary | ICD-10-CM

## 2019-01-24 DIAGNOSIS — I639 Cerebral infarction, unspecified: Secondary | ICD-10-CM | POA: Insufficient documentation

## 2019-01-24 NOTE — Patient Outreach (Signed)
[facility]) Care Management  01/24/2019  Shannon [name] 12/02/1946 [id number]   Care coordination- Assistance with cardiology   [facility] RN CM received a call from [name] to inquire about assistance needed to identify a cardiologist that saw her during her 12/24/2018 hospitalization. She reports she continues to have issues with her BP and her primary MD, Dr Chrissie Noa is wanting to refer her to have cardiac monitoring completed  [name] is able to verify HIPAA   [facility] RN CM reviewed with her the neurology and Radiologist providers that primarily saw her during the hospitalization  [facility] RN CM returned a call to [name] after CM was able to locate a brief EPIC note on 12/26/2018 from Dr Thompson Grayer, Cardiologist, indicating recommendation for a loop monitoring. [name] was able to verify HIPAA. CM provided [name] with the Cardiology contact information to provide to Dr Chrissie Noa to assist with cardiology referral for further monitoring of her BP.  [facility] RN CM educated [name] on the loop monitoring process. She voiced understanding and appreciation of [facility] RN CM assistance  [facility] RN CM routed this note to Dr Myrtie Hawk L. Lavina Hamman, RN, BSN, [place] Coordinator Office number [phone] Mobile number [phone]  Main [facility] number [phone] Fax number [phone]

## 2019-01-24 NOTE — Therapy (Signed)
[place] MAIN [facility] SERVICES [address] [place], [place], [address] Phone: [phone]   Fax:  [phone]  Patient Details  Name: Shannon [name] MRN: [id number] Date of Birth: 01/05/1946 Referring Provider:  Donalynn Furlong, MD  Encounter Date: 01/24/2019   The patient arrived for scheduled appointment.  The patient has no identified speech therapy needs: speech is intelligible, language is intact, and there are no cognitive impairments identified.  The patient does not require speech therapy evaluation and treatment.  Shannon Sea, MS/CCC- SLP  Lou Miner 01/24/2019, 2:19 PM  [place] MAIN [facility] SERVICES [address] [place], [place], [address] Phone: [phone]   Fax:  [phone]

## 2019-01-25 ENCOUNTER — Telehealth (HOSPITAL_COMMUNITY): Payer: Self-pay

## 2019-01-25 ENCOUNTER — Other Ambulatory Visit (HOSPITAL_COMMUNITY): Payer: Self-pay | Admitting: Interventional Radiology

## 2019-01-25 DIAGNOSIS — S301XXA Contusion of abdominal wall, initial encounter: Secondary | ICD-10-CM

## 2019-01-25 DIAGNOSIS — S3012XA Contusion of groin, initial encounter: Secondary | ICD-10-CM

## 2019-01-25 NOTE — Telephone Encounter (Signed)
Called to schedule US pelvis, no answer, left vm. AW

## 2019-01-25 NOTE — Therapy (Signed)
[place] MAIN [facility] SERVICES [address] [place], [place], [address] Phone: [phone]   Fax:  [phone]  Physical Therapy Evaluation  Patient Details  Name: Shannon [name] MRN: [id number] Date of Birth: 1946/12/12 Referring Provider (PT): Donalynn Furlong, MD   Encounter Date: 01/24/2019  PT End of Session - 01/25/19 1047    Visit Number  1    Number of Visits  17    Date for PT Re-Evaluation  03/22/19    Authorization Type  evaluation 01/25/19    Authorization Time Period  on hold until cleared by cardiologist    PT Start Time  1520    PT Stop Time  1615    PT Time Calculation (min)  55 min    Equipment Utilized During Treatment  Gait belt    Activity Tolerance  Patient tolerated treatment well;No increased pain;Treatment limited secondary to medical complications (Comment)    Behavior During Therapy  [facility] for tasks assessed/performed       Past Medical History:  Diagnosis Date  . 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 Cancer ([place])    skin  . Complication of anesthesia    " I had difficulty coming out of it"  . Family history of adverse reaction to anesthesia    " My daughter has a problem coming ou to it"  . GERD (gastroesophageal reflux disease)   . Hypertension   . Stroke ([place])   . Thyroid disease     Past Surgical History:  Procedure Laterality Date  . ABDOMINAL HYSTERECTOMY    . APPENDECTOMY    . CARPAL TUNNEL RELEASE Bilateral   . COLONOSCOPY    . DILATION AND CURETTAGE OF UTERUS    . IR ANGIO INTRA EXTRACRAN SEL COM CAROTID INNOMINATE BILAT MOD SED  12/25/2018  . IR ANGIO VERTEBRAL SEL SUBCLAVIAN INNOMINATE UNI L MOD SED  12/25/2018  . IR ANGIO VERTEBRAL SEL SUBCLAVIAN INNOMINATE UNI R MOD SED  01/04/2019  . IR ANGIO VERTEBRAL SEL VERTEBRAL UNI R MOD SED  12/25/2018  . IR CT HEAD LTD  01/04/2019  . IR INTRA CRAN STENT  01/04/2019  . IR US GUIDE VASC ACCESS RIGHT  12/25/2018  . OVARIAN CYST REMOVAL    . RADIOLOGY WITH  ANESTHESIA N/A 01/04/2019   Procedure: RMCA angioplasty with possible stenting;  Surgeon: Luanne Bras, MD;  Location: [facility];  Service: Radiology;  Laterality: N/A;    There were no vitals filed for this visit.   Subjective Assessment - 01/24/19 1535    Subjective  "My fatigue is getting a little better. I am able to do stuff around the house. I'm not as tired. When I do get tired I stop and rest for a little while."     Pertinent History  73 yo Female s/p CVA on 12/02/18. She was out of town in [place] and wanted to get home. She reports that leading up to Christmas she noticed that she was having trouble with her balance. When she was trying to put up some dishes she just fell over to the floor on the left side. She came to PT for evaluation on 12/20/18. on 12/24/18 she was taken to the ED after syncope and fall. She was worked up for TIA/possible stroke. She was found to have blockage in right MCA and underwent stent placement. She was on restrictions for 2 weeks for no lifting/bending; She reports that last week her doctor released her for activites. She  reports feeling better with her balance not bumping into doorframes or furniture and reports that her endurance is slowly improving.     Limitations  Standing;Walking    How long can you sit comfortably?  NA    How long can you stand comfortably?  at least 30 min    How long can you walk comfortably?  walking about 1 mile today; reports that it felt good to get back to exercise.     Diagnostic tests  Stent placement on right MCA per MRI    Patient Stated Goals  Work back up to walking 2 miles daily    Currently in Pain?  No/denies    Multiple Pain Sites  No         [facility] PT Assessment - 01/25/19 0001      Assessment   Medical Diagnosis  s/p CVA/ s/p stent placement in right MCA    Referring Provider (PT)  Donalynn Furlong, MD    Onset Date/Surgical Date  12/02/18    Hand Dominance  Right    Next MD Visit  next week    Prior Therapy   denies any PT for this condition;       Precautions   Precautions  Fall      Restrictions   Weight Bearing Restrictions  No      Balance Screen   Has the patient fallen in the past 6 months  Yes    How many times?  1    Has the patient had a decrease in activity level because of a fear of falling?   Yes    Is the patient reluctant to leave their home because of a fear of falling?   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, socialize      Cognition   Overall Cognitive Status  Difficult to assess   was having some memory trouble before;    Attention  --   will get headache with prolonged concentration     Observation/Other Assessments   Observations  Cranial nerves- present and intact all 12    Skin Integrity  intact in visualized areas      Sensation   Light Touch  Appears Intact      Coordination   Gross Motor Movements are Fluid and Coordinated  Yes    Finger Nose Finger Test  accurate bilaterally;    Heel Shin Test  accurate bilaterally;      Posture/Postural Control   Posture Comments  able to exhibit erect posture      AROM   Overall AROM Comments  BUE and BLE are [facility]      Strength   Right Hip Flexion  5/5    Right Hip ABduction  4/5    Right Hip ADduction  4/5    Left Hip Flexion  4/5    Left Hip ABduction  4/5    Right Knee Flexion  5/5    Right Knee Extension  5/5  Left Knee Flexion  5/5    Left Knee Extension  5/5    Right Ankle Dorsiflexion  4/5    Left Ankle Dorsiflexion  4/5      Transfers   Comments  able to transfer sit<>stand without pushing on chair, mod I       Ambulation/Gait   Gait Comments  ambulates without AD, good gait speed, does have slight veering with head turns/dual tasks; able to exhibit adequate foot clearance during ambulation      6 Minute Walk- Baseline   6 Minute Walk- Baseline  yes    BP (mmHg)  119/54    HR (bpm)  78    02 Sat (%RA)  100 %      6 Minute walk- Post Test   6 Minute Walk Post Test  yes    BP (mmHg)  175/51    HR (bpm)  78    02 Sat (%RA)  100 %      6 minute walk test results    Aerobic Endurance Distance Walked  1300    Endurance additional comments  without AD, slightly lower than age group norms of 1500 feet;       Standardized Balance Assessment   10 Meter Walk  1.05 m/s without AD, community ambulator, low fall risk      Functional Gait  Assessment   Gait assessed   Yes    Gait Level Surface  Walks 20 ft in less than 5.5 sec, no assistive devices, good speed, no evidence for imbalance, normal gait pattern, deviates no more than 6 in outside of the 12 in walkway width.    Change in Gait Speed  Able to smoothly change walking speed without loss of balance or gait deviation. Deviate no more than 6 in outside of the 12 in walkway width.    Gait with Horizontal Head Turns  Performs head turns smoothly with slight change in gait velocity (eg, minor disruption to smooth gait path), deviates 6-10 in outside 12 in walkway width, or uses an assistive device.    Gait with Vertical Head Turns  Performs task with slight change in gait velocity (eg, minor disruption to smooth gait path), deviates 6 - 10 in outside 12 in walkway width or uses assistive device    Gait and Pivot Turn  Pivot turns safely within 3 sec and stops quickly with no loss of balance.    Step Over Obstacle  Is able to step over one shoe box (4.5 in total height) without changing gait speed. No evidence of imbalance.    Gait with Narrow Base of Support  Ambulates less than 4 steps heel to toe or cannot perform without assistance.    Gait with  Eyes Closed  Walks 20 ft, slow speed, abnormal gait pattern, evidence for imbalance, deviates 10-15 in outside 12 in walkway width. Requires more than 9 sec to ambulate 20 ft.    Ambulating Backwards  Walks 20 ft, uses assistive device, slower speed, mild gait deviations, deviates 6-10 in outside 12 in walkway width.    Steps  Alternating feet, must use rail.    Total Score  20    FGA comment:  high risk for falls                Objective measurements completed on examination: See above findings.    Will address HEP next visit as patient needs medical clearance from cardiology regarding fluctuating BP  PT Education - 01/25/19 1047    Education Details  recommendation/plan of care    Person(s) Educated  Patient    Methods  Explanation    Comprehension  Verbalized understanding       PT Short Term Goals - 01/25/19 1052      PT SHORT TERM GOAL #1   Title  Patient will be adherent to HEP at least 3x a week to improve functional strength and balance for better safety at home.    Time  2    Period  Weeks    Status  New    Target Date  02/08/19      PT SHORT TERM GOAL #2   Title  Patient will tolerate 5 seconds of single leg stance without loss of balance to improve ability to get in and out of shower safely.    Time  2    Period  Weeks    Status  New    Target Date  02/08/19        PT Long Term Goals - 01/25/19 1052      PT LONG TERM GOAL #1   Title  Patient will increase Functional Gait Assessment score to >20/30 as to reduce fall risk and improve dynamic gait safety with community ambulation.    Time  8    Period  Weeks    Status  New    Target Date  03/22/19      PT LONG TERM GOAL #2   Title  Patient will increase BLE gross strength to 4+/5 as to improve functional strength for independent gait, increased standing tolerance and increased ADL ability.    Time  8    Period  Weeks    Status  New    Target Date  03/22/19      PT LONG TERM GOAL  #3   Title  Patient will increase six minute walk test distance to >1500 for progression to age group norms and improve gait ability    Time  8    Period  Weeks    Status  New    Target Date  03/22/19             Plan - 01/25/19 1048    Clinical Impression Statement  73 yo Female was evaluated for s/p CVA on 12/20/18. However a few days after she was taken back to the ED with syncope episode. Imaging showed stenosis in right MCA and worsening stroke. She underwent stent placement in right MCA. Patient does continue to have mild left sided weakness. She reports that she was on activity restrictions for 2 weeks after procedure but that has since been lifted and she has been slowly getting back to walking and resuming her daily activities. Patient did exhibit a significant increase in BP with 6 min walk test which is concerning given patient's past medical history. she reports that her primary physician did recommend that she follow up with cardiology regarding her fluctuating BP. Patient would benefit from skilled PT Intervention to address LE weakness and decreased mobility. However recommend that she follow up with cardiology first and get medical clearance before pursing skilled intervention. patient agreeable.     History and Personal Factors relevant to plan of care:  lives with spouse, recent fall, has 2 story home with 1 step to enter, co-morbities including HTN with fluctuating BP    Clinical Presentation  Unstable    Clinical Presentation due to:  concerned about fluctuating BP and  recent stroke medical history;     Clinical Decision Making  High    Rehab Potential  Good    Clinical Impairments Affecting Rehab Potential  motivated and good PLOF    PT Frequency  2x / week    PT Duration  8 weeks    PT Treatment/Interventions  Cryotherapy;Moist Heat;Gait training;Stair training;Functional mobility training;Therapeutic activities;Therapeutic exercise;Balance training;Neuromuscular  re-education;Patient/family education;Energy conservation;Electrical Stimulation;Aquatic Therapy    PT Home Exercise Plan  will address next visit    Consulted and Agree with Plan of Care  Patient       Patient will benefit from skilled therapeutic intervention in order to improve the following deficits and impairments:  Abnormal gait, Decreased mobility, Impaired perceived functional ability, Decreased strength, Decreased endurance, Decreased activity tolerance, Decreased balance, Difficulty walking  Visit Diagnosis: Muscle weakness (generalized)  Unsteadiness on feet     Problem List Patient Active Problem List   Diagnosis Date Noted  . 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 Influenza B   . Hypothyroidism 12/25/2018  . TIA (transient ischemic attack)   . CVA (cerebral vascular accident) ([place]) 12/08/2018  . Essential hypertension 01/16/2015    Robertlee Rogacki,MargaretPT,DPT 01/25/2019, 10:53 AM  [place] MAIN [facility] SERVICES [address] [place], [place], [address] Phone: [phone]   Fax:  [phone]  Name: Shannon [name] MRN: [id number] Date of Birth: 07/16/1946

## 2019-01-26 ENCOUNTER — Ambulatory Visit: Payer: Medicare Other | Admitting: Occupational Therapy

## 2019-01-26 ENCOUNTER — Ambulatory Visit (HOSPITAL_COMMUNITY)
Admission: RE | Admit: 2019-01-26 | Discharge: 2019-01-26 | Disposition: A | Discharge status: Home / Self Care | Payer: Medicare Other | Source: Ambulatory Visit | Attending: Interventional Radiology | Admitting: Interventional Radiology

## 2019-01-26 DIAGNOSIS — S301XXA Contusion of abdominal wall, initial encounter: Secondary | ICD-10-CM | POA: Insufficient documentation

## 2019-01-29 ENCOUNTER — Ambulatory Visit: Payer: Medicare Other

## 2019-01-29 ENCOUNTER — Ambulatory Visit: Payer: Medicare Other | Admitting: Physical Therapy

## 2019-01-29 ENCOUNTER — Ambulatory Visit: Payer: Medicare Other | Admitting: Speech Pathology

## 2019-01-31 ENCOUNTER — Ambulatory Visit: Payer: Medicare Other | Admitting: Physical Therapy

## 2019-01-31 ENCOUNTER — Encounter: Payer: Medicare Other | Admitting: Speech Pathology

## 2019-01-31 ENCOUNTER — Ambulatory Visit: Payer: Medicare Other

## 2019-02-02 ENCOUNTER — Encounter: Payer: Medicare Other | Admitting: Occupational Therapy

## 2019-02-05 ENCOUNTER — Ambulatory Visit: Payer: Medicare Other

## 2019-02-06 ENCOUNTER — Encounter: Payer: Medicare Other | Admitting: Occupational Therapy

## 2019-02-06 ENCOUNTER — Ambulatory Visit: Payer: Medicare Other | Admitting: Physical Therapy

## 2019-02-06 ENCOUNTER — Encounter: Payer: Medicare Other | Admitting: Speech Pathology

## 2019-02-07 ENCOUNTER — Ambulatory Visit (INDEPENDENT_AMBULATORY_CARE_PROVIDER_SITE_OTHER): Payer: Medicare Other | Admitting: Neurology

## 2019-02-07 ENCOUNTER — Ambulatory Visit: Payer: Medicare Other

## 2019-02-07 ENCOUNTER — Encounter: Payer: Self-pay | Admitting: Neurology

## 2019-02-07 VITALS — BP 162/84 | HR 80 | Ht 65.0 in | Wt 113.8 lb

## 2019-02-07 DIAGNOSIS — I6601 Occlusion and stenosis of right middle cerebral artery: Secondary | ICD-10-CM

## 2019-02-07 DIAGNOSIS — I639 Cerebral infarction, unspecified: Secondary | ICD-10-CM

## 2019-02-07 NOTE — Progress Notes (Signed)
[facility] [address] [place]. [place] [address] [phone]       OFFICE CONSULT NOTE  Ms. Chelise Hanger Date of Birth:  January 16, 1946 Medical Record Number:  [id number]   Referring MD: Rosalin Hawking  Reason for Referral: Stroke  HPI: Ms. Devincentis is a 73 year old Caucasian lady seen today for initial office consultation visit following a stroke.  She is accompanied by her husband.  History is obtained from them and review of electronic medical records.  I personally reviewed imaging films in PACS.  Ms. Mcgowen has a past medical history of hypertension and silent left basal ganglia infarct who was initially seen on 12/08/2018 for transient right face weakness which resolved.  At that time she was found to have high-grade right middle cerebral artery stenosis as well as acute right basal ganglia infarct.  She was also found to have moderate left supraclinoid ICA narrowing and remote age left basal ganglia and small right frontal cortical infarcts.  She was discharged on aspirin and Plavix but returned on 12/24/2018 as a code stroke for evaluation for right gaze deviation and left hemiplegia.  Her blood pressure was found to be 64 systolic and after giving IV fluids it improved to 270 systolic.  She rapidly improved and return back to her baseline.  MRI showed right basal ganglia infarct and severe right M1 stenosis and moderate atherosclerotic changes.  Diagnostic cerebral catheter angiogram performed by Dr. Estanislado Pandy on 12/25/2018 showed severe high-grade stenosis of the right middle cerebral artery in the distal M1 segment with 50% stenosis of the left internal carotid artery supraclinoid segment, left middle cerebral artery M1 segment and to a lesser degree left anterior cerebral artery in the A1 segment.  There was also 50% stenosis of dominant right vertebral artery at its origin.  The patient was discharged home with plans for elective angioplasty and stenting in 2 weeks.  She will  return for this and underwent right M1 angioplasty and stenting.  Postprocedure she had left facial droop and transient worsening.  MRI scan of the brain was obtained which showed slight extension of the basal ganglia infarct.  Patient was eventually discharged home and has done well.  She remains on aspirin and Brilinta which is tolerating well.  She states the facial weakness has resolved.  She still has some heaviness in in the left upper extremity and diminished fine motor skills.  She feels her stamina is not the same and she gets tired easily.  She has been compliant with all her medications but feels her blood pressure has been quite fluctuating.  She is keeping a daily blood pressure log but her primary care physician plans to check ambulatory blood pressure monitor.  She is tolerating Lipitor well without muscle aches and pains.  He has no problems with gait and balance.  She has a lot of anxiety from her condition and has not been returning to her baseline despite physically doing well. ROS:   14 system review of systems is positive for fatigue, chest pain, cough, easy bruising, feeling cold, cramps, anxiety and all other systems negative  PMH:  Past Medical History:  Diagnosis Date  . 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 Cancer ([place])    skin  . Complication of anesthesia    " I had difficulty coming out of it"  . Family history of adverse reaction to anesthesia    " My daughter has a problem coming ou to it"  . GERD (gastroesophageal  reflux disease)   . Hypertension   . Stroke ([place])   . Thyroid disease     Social History:  Social History   Socioeconomic History  . Marital status: Married    Spouse name: Not on file  . Number of children: Not on file  . Years of education: Not on file  . Highest education level: Not on file  Occupational History  . Occupation: retired  Scientific laboratory technician  . Financial resource strain: Not on file  . Food insecurity:    Worry: Not on file     Inability: Not on file  . Transportation needs:    Medical: Not on file    Non-medical: Not on file  Tobacco Use  . Smoking status: Never Smoker  . Smokeless tobacco: Never Used  Substance and Sexual Activity  . Alcohol use: Never    Frequency: Never  . Drug use: Never  . Sexual activity: Yes  Lifestyle  . Physical activity:    Days per week: Not on file    Minutes per session: Not on file  . Stress: Not on file  Relationships  . Social connections:    Talks on phone: Not on file    Gets together: Not on file    Attends religious service: Not on file    Active member of club or organization: Not on file    Attends meetings of clubs or organizations: Not on file    Relationship status: Not on file  . Intimate partner violence:    Fear of current or ex partner: Not on file    Emotionally abused: Not on file    Physically abused: Not on file    Forced sexual activity: Not on file  Other Topics Concern  . Not on file  Social History Narrative  . Not on file    Medications:   Current Outpatient Medications on File Prior to Visit  Medication Sig Dispense Refill  . Alum Hydroxide-Mag Carbonate (GAVISCON PO) Take 5 mLs by mouth daily as needed.    Marland Kitchen aspirin EC 81 MG EC tablet Take 1 tablet (81 mg total) by mouth daily. 30 tablet 0  . atorvastatin (LIPITOR) 40 MG tablet Take 1 tablet (40 mg total) by mouth daily at 6 PM. 30 tablet 0  . calcium carbonate (OS-CAL) 600 MG tablet Take 1 tablet by mouth daily. Take 1500 daily    . co-enzyme Q-10 30 MG capsule Take 30 mg by mouth daily.    . famotidine-calcium carbonate-magnesium hydroxide (PEPCID COMPLETE) 10-800-165 MG chewable tablet Chew 1 tablet by mouth daily as needed.    Marland Kitchen levothyroxine (SYNTHROID, LEVOTHROID) 50 MCG tablet Take 1 tablet by mouth 3 (three) times daily.     Marland Kitchen levothyroxine (SYNTHROID, LEVOTHROID) 75 MCG tablet Take 1 tablet by mouth 4 (four) times a week.     Marland Kitchen lisinopril (PRINIVIL,ZESTRIL) 10 MG tablet Take 10  mg by mouth daily.    . Multiple Vitamin (MULTIVITAMIN) capsule Take 1 capsule by mouth daily.    . ticagrelor (BRILINTA) 90 MG TABS tablet Take 1 tablet (90 mg total) by mouth 2 (two) times daily. 60 tablet 0  . vitamin E 400 UNIT capsule Take 400 Units by mouth every other day.      No current facility-administered medications on file prior to visit.     Allergies:   Allergies  Allergen Reactions  . Other Palpitations    SINUTAB  . Sulfa Antibiotics Other (See Comments)  Tongue turned black  . Mandol [Cefamandole] Rash    Physical Exam General: Frail elderly Caucasian lady, seated, in no evident distress Head: head normocephalic and atraumatic.   Neck: supple with no carotid or supraclavicular bruits Cardiovascular: regular rate and rhythm, no murmurs Musculoskeletal: no deformity Skin:  no rash/petichiae Vascular:  Normal pulses all extremities  Neurologic Exam Mental Status: Awake and fully alert. Oriented to place and time. Recent and remote memory intact. Attention span, concentration and fund of knowledge appropriate. Mood and affect appropriate.  Cranial Nerves: Fundoscopic exam reveals sharp disc margins. Pupils equal, briskly reactive to light. Extraocular movements full without nystagmus. Visual fields full to confrontation. Hearing intact. Facial sensation intact.  Mild left lower facial asymmetry., tongue, palate moves normally and symmetrically.  Motor: Normal bulk and tone. Normal strength in all tested extremity muscles.  Diminished fine finger movements on the left.  Orbits right over left upper extremity.  Minimum left grip weakness. Sensory.: intact to touch , pinprick , position and vibratory sensation.  Coordination: Rapid alternating movements normal in all extremities. Finger-to-nose and heel-to-shin performed accurately bilaterally. Gait and Station: Arises from chair without difficulty. Stance is normal. Gait demonstrates normal stride length and balance .  Able to heel, toe and tandem walk with mild  difficulty.  Reflexes: 1+ and symmetric. Toes downgoing.   NIHSS 1 Modified [place]  2  ASSESSMENT: 73 year old Caucasian lady with symptomatic right middle cerebral artery stenosis in January 2020 status post elective angioplasty and stenting with good clinical results.  Vascular risk factors of hypertension hyperlipidemia and intracranial atherosclerosis.     PLAN: I had a long d/w patient  and her husband about her recent stroke, right middle cerebral artery symptomatic stenosis and angioplasty stenting, risk for recurrent stroke/TIAs, personally independently reviewed imaging studies and stroke evaluation results and answered questions.Continue aspirin and Brilinta for 6 months followed by aspirin alone for secondary stroke prevention and maintain strict control of hypertension with blood pressure goal below 130/90, diabetes with hemoglobin A1c goal below 6.5% and lipids with LDL cholesterol goal below 70 mg/dL. I also advised the patient to eat a healthy diet with plenty of whole grains, cereals, fruits and vegetables, exercise regularly and maintain ideal body weight.  Continue follow-up with Dr. Estanislado Pandy for intracranial stenting follow-up imaging.  I advised her to follow-up with her primary care physician for ambulatory blood pressure recording.Greater than 50% time during this 45-minute consultation visit was spent on counseling and coordination of care about her symptomatic right middle cerebral artery stenosis and discussion about angioplasty stenting and stroke prevention and answering questions followup in the future with me in the future in 6 months or call earlier if necessary Antony Contras, MD  [facility] [address] [place], [place] [address]  Phone [phone] Fax [phone] Note: This document was prepared with digital dictation and possible smart phrase technology. Any transcriptional errors  that result from this process are unintentional.

## 2019-02-07 NOTE — Patient Instructions (Signed)
I had a long d/w patient  and her husband about her recent stroke, right middle cerebral artery symptomatic stenosis and angioplasty stenting, risk for recurrent stroke/TIAs, personally independently reviewed imaging studies and stroke evaluation results and answered questions.Continue aspirin and Brilinta for 6 months followed by aspirin alone for secondary stroke prevention and maintain strict control of hypertension with blood pressure goal below 130/90, diabetes with hemoglobin A1c goal below 6.5% and lipids with LDL cholesterol goal below 70 mg/dL. I also advised the patient to eat a healthy diet with plenty of whole grains, cereals, fruits and vegetables, exercise regularly and maintain ideal body weight.  Continue follow-up with Dr. Estanislado Pandy for intracranial stenting follow-up imaging.  I advised her to follow-up with her primary care physician for ambulatory blood pressure recording.  Followup in the future with me in the future in 6 months or call earlier if necessary   Stroke Prevention Some medical conditions and behaviors are associated with a higher chance of having a stroke. You can help prevent a stroke by making nutrition, lifestyle, and other changes, including managing any medical conditions you may have. What nutrition changes can be made?   Eat healthy foods. You can do this by: ? Choosing foods high in fiber, such as fresh fruits and vegetables and whole grains. ? Eating at least 5 or more servings of fruits and vegetables a day. Try to fill half of your plate at each meal with fruits and vegetables. ? Choosing lean protein foods, such as lean cuts of meat, poultry without skin, fish, tofu, beans, and nuts. ? Eating low-fat dairy products. ? Avoiding foods that are high in salt (sodium). This can help lower blood pressure. ? Avoiding foods that have saturated fat, trans fat, and cholesterol. This can help prevent high cholesterol. ? Avoiding processed and premade foods.  Follow  your health care provider's specific guidelines for losing weight, controlling high blood pressure (hypertension), lowering high cholesterol, and managing diabetes. These may include: ? Reducing your daily calorie intake. ? Limiting your daily sodium intake to 1,500 milligrams (mg). ? Using only healthy fats for cooking, such as olive oil, canola oil, or sunflower oil. ? Counting your daily carbohydrate intake. What lifestyle changes can be made?  Maintain a healthy weight. Talk to your health care provider about your ideal weight.  Get at least 30 minutes of moderate physical activity at least 5 days a week. Moderate activity includes brisk walking, biking, and swimming.  Do not use any products that contain nicotine or tobacco, such as cigarettes and e-cigarettes. If you need help quitting, ask your health care provider. It may also be helpful to avoid exposure to secondhand smoke.  Limit alcohol intake to no more than 1 drink a day for nonpregnant women and 2 drinks a day for men. One drink equals 12 oz of beer, 5 oz of wine, or 1 oz of hard liquor.  Stop any illegal drug use.  Avoid taking birth control pills. Talk to your health care provider about the risks of taking birth control pills if: ? You are over 27 years old. ? You smoke. ? You get migraines. ? You have ever had a blood clot. What other changes can be made?  Manage your cholesterol levels. ? Eating a healthy diet is important for preventing high cholesterol. If cholesterol cannot be managed through diet alone, you may also need to take medicines. ? Take any prescribed medicines to control your cholesterol as told by your health care provider.  Manage your diabetes. ? Eating a healthy diet and exercising regularly are important parts of managing your blood sugar. If your blood sugar cannot be managed through diet and exercise, you may need to take medicines. ? Take any prescribed medicines to control your diabetes as  told by your health care provider.  Control your hypertension. ? To reduce your risk of stroke, try to keep your blood pressure below 130/80. ? Eating a healthy diet and exercising regularly are an important part of controlling your blood pressure. If your blood pressure cannot be managed through diet and exercise, you may need to take medicines. ? Take any prescribed medicines to control hypertension as told by your health care provider. ? Ask your health care provider if you should monitor your blood pressure at home. ? Have your blood pressure checked every year, even if your blood pressure is normal. Blood pressure increases with age and some medical conditions.  Get evaluated for sleep disorders (sleep apnea). Talk to your health care provider about getting a sleep evaluation if you snore a lot or have excessive sleepiness.  Take over-the-counter and prescription medicines only as told by your health care provider. Aspirin or blood thinners (antiplatelets or anticoagulants) may be recommended to reduce your risk of forming blood clots that can lead to stroke.  Make sure that any other medical conditions you have, such as atrial fibrillation or atherosclerosis, are managed. What are the warning signs of a stroke? The warning signs of a stroke can be easily remembered as BEFAST.  B is for balance. Signs include: ? Dizziness. ? Loss of balance or coordination. ? Sudden trouble walking.  E is for eyes. Signs include: ? A sudden change in vision. ? Trouble seeing.  F is for face. Signs include: ? Sudden weakness or numbness of the face. ? The face or eyelid drooping to one side.  A is for arms. Signs include: ? Sudden weakness or numbness of the arm, usually on one side of the body.  S is for speech. Signs include: ? Trouble speaking (aphasia). ? Trouble understanding.  T is for time. ? These symptoms may represent a serious problem that is an emergency. Do not wait to see if  the symptoms will go away. Get medical help right away. Call your local emergency services (911 in the [place] Do not drive yourself to the hospital.  Other signs of stroke may include: ? A sudden, severe headache with no known cause. ? Nausea or vomiting. ? Seizure. Where to find more information For more information, visit:  American Stroke Association: www.strokeassociation.org  National Stroke Association: www.stroke.org Summary  You can prevent a stroke by eating healthy, exercising, not smoking, limiting alcohol intake, and managing any medical conditions you may have.  Do not use any products that contain nicotine or tobacco, such as cigarettes and e-cigarettes. If you need help quitting, ask your health care provider. It may also be helpful to avoid exposure to secondhand smoke.  Remember BEFAST for warning signs of stroke. Get help right away if you or a loved one has any of these signs. This information is not intended to replace advice given to you by your health care provider. Make sure you discuss any questions you have with your health care provider. Document Released: 01/06/2005 Document Revised: 01/04/2017 Document Reviewed: 01/04/2017 Elsevier Interactive Patient Education  2019 [employer].

## 2019-02-08 ENCOUNTER — Ambulatory Visit: Payer: Medicare Other | Admitting: Physical Therapy

## 2019-02-08 ENCOUNTER — Encounter: Payer: Medicare Other | Admitting: Speech Pathology

## 2019-02-08 ENCOUNTER — Encounter: Payer: Medicare Other | Admitting: Occupational Therapy

## 2019-02-09 ENCOUNTER — Encounter: Payer: Medicare Other | Admitting: Occupational Therapy

## 2019-02-12 ENCOUNTER — Telehealth: Payer: Self-pay

## 2019-02-12 ENCOUNTER — Ambulatory Visit: Payer: Medicare Other

## 2019-02-12 NOTE — Telephone Encounter (Signed)
NOTES ON FILE 

## 2019-02-14 ENCOUNTER — Ambulatory Visit: Payer: Medicare Other

## 2019-02-19 ENCOUNTER — Ambulatory Visit: Payer: Medicare Other

## 2019-02-21 ENCOUNTER — Ambulatory Visit: Payer: Medicare Other

## 2019-03-07 ENCOUNTER — Telehealth: Payer: Self-pay | Admitting: Cardiology

## 2019-03-07 NOTE — Telephone Encounter (Signed)
Per pt had stroke in [place] and had "stent done to brain" and f/u with PMD  2 weeks  Per pt B/P has been roller coaster and PMD referred to cardiology for B/P monitor (24 hours) Pt states it has taken 2 months for appt is calling to see if may be kept Will forward to Dr Harrell Gave for review and recommendations ./cy

## 2019-03-07 NOTE — Telephone Encounter (Signed)
New Message:    Pt wants to know if she still have her appointment on Tuesday with Dr Harrell Gave?

## 2019-03-08 NOTE — Telephone Encounter (Signed)
Left message to call back  

## 2019-03-08 NOTE — Telephone Encounter (Signed)
Can we have her log her blood pressures, and let's do her visit Tuesday as either telephone or Webex given that she had flu earlier in the year and would be high risk if she contracted COVID. We are not currently doing ambulatory blood pressure testing given the virus concerns, but we can set her up for this as soon as we return to normal scheduling. Thank you!

## 2019-03-08 NOTE — Telephone Encounter (Signed)
New Message  Patient returning Alisha's call

## 2019-03-09 NOTE — Telephone Encounter (Signed)
   In the setting of the current Covid19 crisis, you are scheduled for a (phone or video) visit with your provider on (date) at (time).  Just as we do with many in-office visits, in order for you to participate in this visit, we must obtain consent.  If you'd like, I can send this to your mychart (if signed up) or email for you to review.  Otherwise, I can obtain your verbal consent now.  All virtual visits are billed to your insurance company just like a normal visit would be.  By agreeing to a virtual visit, we'd like you to understand that the technology does not allow for your provider to perform an examination, and thus may limit your provider's ability to fully assess your condition.  Finally, though the technology is pretty good, we cannot assure that it will always work on either your or our end, and in the setting of a video visit, we may have to convert it to a phone-only visit.  In either situation, we cannot ensure that we have a secure connection.  Are you willing to proceed?  Pt updated with Dr. Judeth Cornfield recommendations. Scheduled appointment converted to tele-health visit (Phone).   TELEPHONE CALL NOTE  Shannon [name] has been deemed a candidate for a follow-up tele-health visit to limit community exposure during the Covid-19 pandemic. I spoke with the patient via phone to ensure availability of phone/video source, confirm preferred email & phone number, and discuss instructions and expectations.  I reminded Shannon [name] to be prepared with any vital sign and/or heart rhythm information that could potentially be obtained via home monitoring, at the time of her visit. I reminded Shannon [name] to expect a call 15 mins prior to her scheduled time.  Pt does not have access to my chart pt verbally consented.   Meryl Crutch, RN 03/09/2019 10:15 AM

## 2019-03-12 ENCOUNTER — Other Ambulatory Visit: Payer: Self-pay

## 2019-03-13 ENCOUNTER — Encounter: Payer: Self-pay | Admitting: Cardiology

## 2019-03-13 ENCOUNTER — Telehealth (INDEPENDENT_AMBULATORY_CARE_PROVIDER_SITE_OTHER): Payer: Medicare Other | Admitting: Cardiology

## 2019-03-13 VITALS — BP 146/69 | HR 66 | Ht 65.0 in | Wt 113.0 lb

## 2019-03-13 DIAGNOSIS — I10 Essential (primary) hypertension: Secondary | ICD-10-CM | POA: Diagnosis not present

## 2019-03-13 DIAGNOSIS — Z8673 Personal history of transient ischemic attack (TIA), and cerebral infarction without residual deficits: Secondary | ICD-10-CM | POA: Diagnosis not present

## 2019-03-13 NOTE — Progress Notes (Signed)
Virtual Visit via Telephone Note    Evaluation Performed:  Establish care  This visit type was conducted due to national recommendations for restrictions regarding the COVID-19 Pandemic (e.g. social distancing).  This format is felt to be most appropriate for this patient at this time.  All issues noted in this document were discussed and addressed.  No physical exam was performed (except for noted visual exam findings with Video Visits).  Please refer to the patient's chart (MyChart message for video visits and phone note for telephone visits) for the patient's consent to telehealth for Shannon [name].  Date:  03/13/2019   ID:  Shannon [name], DOB 1946/01/09, MRN [id number]  Patient Location:  [address] [place], [place] [address]  Provider location:   [place], [place] Office  PCP:  Shannon Furlong, MD  Cardiologist:  Shannon [name] (new)  Chief Complaint:  hypertension  History of Present Illness:    Shannon [name] is a 73 y.o. female who presents via audio/video conferencing for a telehealth visit today.    The patient does not have symptoms concerning for COVID-19 infection (fever, chills, cough, or new shortness of breath).   Her PMH is notable for hypertension, and silent left basal ganglia infarct. She had stroke symptoms on 12/08/18 and was found to have high grade right MCA stenosis and acute right basal ganglia infarct. She also had evidence of multiple small prior strokes in right frontal cortex. She was initially treated with aspirin and clopidogrel, but she returned to ER with stroke on 12/24/18 (right gaze deviation/left hemiplegia). She was noted to be hypotensive to the 66Q systolic at the time, and after IV fluids and normotension, her symptoms resolved. She was discharged and returned electively for right MCA stenting. Post procedure she did have worsening symptoms, but she was ultimately discharged home on aspirin and ticagrelor.   Per  report, her primary care doctor had discussed ambulatory blood pressure monitoring.  Patient concerns today: -PCP prescribed for ambulatory BP monitor for 30 days. She isn't sure how she gets this. -Still feels like her energy level is reduced from where it was. Feels mildly short of breath with exercise or heavy housework. Before her strokes, could walk 2 miles. Now cannot walk as far, though she does note that it is taking some time to recover from the strokes.   Recent BP log: all taken before she took her lisinopril. 126/66-68 3/22 947/65-46 3/23 125/70-85 3/24 140/72-83 3/25 144/67-65 3/26 120/63-67 3/27 121/63-75 3/28 130/63-75 3/29 119/69-85 3/30 146/69-66 3/31  Jan: 154/79 highest Lowest 106/63, HR 87 in February (had sharp pain on left side of head)  Currently on lisinopril 10 mg daily. Was also on metoprolol 25 mg twice daily in the past, but had no energy. Cut metoprolol dose in half, still felt poorly. Over the summer, had low blood pressures in the morning when on both metoprolol 12.5 mg bid and lisinopril. Was seeing cardiologist at [facility], told to stop the metoprolol completely.   Had ETT in 2019: Component Name Value Ref Range  PMHR 148 bpm  Baseline HR 70 bpm  Baseline BP 186/83 mmHg  Peak HR 121 bpm  PMHR % 82 %  Peak BP 213/80 mmHg  Exercise duration (min) 9 min  Exercise duration (sec) 0 sec  Stage Reached 3   Estimated workload 10.1 METS  ST Deviation 0.0 mm  Duke Treadmill Score 5.0   Angina Score for Duke Treadmill Score 1   Other Result Information  This result has an attachment that is not available.  Result Narrative    Minor non-specific ST and T wave changes noted during stress or recovery  Overall, the patient's exercise capacity was excellent.  Negative ETT for ischemia at workload achieved  The patient experienced atypical chest pain during the stress test.  No significant arrhythmia noted   Noted that her baseline BP at that time  was 186/83, with peak 213/80. Was on lisinopril 10 mg at the time.  Had heart cath 2013, was told she had one mild blockage. I cannot see these results, but per notes in Care Everywhere had a 30% in the RCA.   Secondary prevention: on aspirin 81mg , atorvastatin 40 mg, lisinopril 10 mg  Prior CV studies:   The following studies were reviewed today: ETT and Care Everywhere notes  Past Medical History:  Diagnosis Date  . 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 Cancer ([place])    skin  . Complication of anesthesia    " I had difficulty coming out of it"  . Family history of adverse reaction to anesthesia    " My daughter has a problem coming ou to it"  . GERD (gastroesophageal reflux disease)   . Hypertension   . Stroke ([place])   . Thyroid disease    Past Surgical History:  Procedure Laterality Date  . ABDOMINAL HYSTERECTOMY    . APPENDECTOMY    . CARPAL TUNNEL RELEASE Bilateral   . COLONOSCOPY    . DILATION AND CURETTAGE OF UTERUS    . IR ANGIO INTRA EXTRACRAN SEL COM CAROTID INNOMINATE BILAT MOD SED  12/25/2018  . IR ANGIO VERTEBRAL SEL SUBCLAVIAN INNOMINATE UNI L MOD SED  12/25/2018  . IR ANGIO VERTEBRAL SEL SUBCLAVIAN INNOMINATE UNI R MOD SED  01/04/2019  . IR ANGIO VERTEBRAL SEL VERTEBRAL UNI R MOD SED  12/25/2018  . IR CT HEAD LTD  01/04/2019  . IR INTRA CRAN STENT  01/04/2019  . IR US GUIDE VASC ACCESS RIGHT  12/25/2018  . OVARIAN CYST REMOVAL    . RADIOLOGY WITH ANESTHESIA N/A 01/04/2019   Procedure: RMCA angioplasty with possible stenting;  Surgeon: Luanne Bras, MD;  Location: [facility];  Service: Radiology;  Laterality: N/A;     Current Meds  Medication Sig  . aluminum hydroxide-magnesium carbonate (GAVISCON) 95-358 MG/15ML SUSP Take 5 mLs by mouth as needed.  Marland Kitchen aspirin EC 81 MG EC tablet Take 1 tablet (81 mg total) by mouth daily.  Marland Kitchen atorvastatin (LIPITOR) 40 MG tablet Take 1 tablet (40 mg total) by mouth daily at 6 PM.  . calcium carbonate (OS-CAL) 600 MG tablet Take 1,500  tablets by mouth daily. Take 1500 daily  . Coenzyme Q10 50 MG CAPS Take 50 mg by mouth daily.   . famotidine-calcium carbonate-magnesium hydroxide (PEPCID COMPLETE) 10-800-165 MG chewable tablet Chew 1 tablet by mouth daily as needed.  Marland Kitchen levothyroxine (SYNTHROID, LEVOTHROID) 50 MCG tablet Take 1 tablet by mouth 3 (three) times daily.   Marland Kitchen levothyroxine (SYNTHROID, LEVOTHROID) 75 MCG tablet Take 1 tablet by mouth 4 (four) times a week.   Marland Kitchen lisinopril (PRINIVIL,ZESTRIL) 10 MG tablet Take 10 mg by mouth daily.  . Multiple Vitamin (MULTIVITAMIN) capsule Take 1 capsule by mouth daily.  . ticagrelor (BRILINTA) 90 MG TABS tablet Take 1 tablet (90 mg total) by mouth 2 (two) times daily.  . vitamin E 400 UNIT capsule Take 400 Units by mouth every other day.   . [DISCONTINUED] Alum Hydroxide-Mag  Carbonate (GAVISCON PO) Take 5 mLs by mouth daily as needed.     Allergies:   Other; Sulfa antibiotics; and Mandol [cefamandole]   Social History   Tobacco Use  . Smoking status: Never Smoker  . Smokeless tobacco: Never Used  Substance Use Topics  . Alcohol use: Never    Frequency: Never  . Drug use: Never     Family Hx: The patient's family history includes CVA in her maternal aunt and mother; Heart disease in her mother; Peripheral Artery Disease in her father.  ROS:   Please see the history of present illness.    All other systems reviewed and are negative.   Labs/Other Tests and Data Reviewed:    Recent Labs: 12/08/2018: TSH 1.901 12/24/2018: ALT 21 01/05/2019: BUN 7; Creatinine, Ser 0.66; Hemoglobin 11.9; Platelets 279; Potassium 3.7; Sodium 139   Recent Lipid Panel Lab Results  Component Value Date/Time   CHOL 199 12/09/2018 03:52 AM   TRIG 110 12/09/2018 03:52 AM   HDL 45 12/09/2018 03:52 AM   CHOLHDL 4.4 12/09/2018 03:52 AM   LDLCALC 132 (H) 12/09/2018 03:52 AM    Wt Readings from Last 3 Encounters:  03/13/19 113 lb (51.3 kg)  02/07/19 113 lb 12.8 oz (51.6 kg)  01/04/19 120 lb  9.5 oz (54.7 kg)     Objective:    Vital Signs:  BP (!) 146/69   Pulse 66   Ht 5\' 5"  (1.651 m)   Wt 113 lb (51.3 kg)   BMI 18.80 kg/m     ASSESSMENT & PLAN:    1.  Hypertension:  -readings at home are prior to lisinopril -discussed how to measure home BP. Would prefer to have it taken 1-2 hours after taking medication, sitting still in chair for 5-10 minutes, feet flat on floor, arms relaxed.  -she will take these measurements, and we will discuss again over the phone in 1 week -she was recommended for an ambulatory BP monitor for 1 month. This is a very long time given the frequency of readings. Would recommend several days at most. We can arrange this once elective procedures are being performed (on hold due to [place]) -instructed on red flag warning signs that need immediate medical attention -would avoid beta blockers in the future given her fatigue.  2. History of CVA, with need for secondary prevention: -on aspirin, ticagrelor. Ticagrelor duration per interventional neurology -on high intensity atorvastatin -echo 12/09/18 was normal, negative bubble study  Patient Risk:   After full review of this patient's clinical status, I feel that they are at least moderate risk at this time.  Time:   Today, I have spent 23 minutes with the patient with telehealth technology discussing hypertension management.    Patient Instructions  Medication Instructions:  Your Physician recommend you continue on your current medication as directed.    If you need a refill on your cardiac medications before your next appointment, please call your pharmacy.   Lab work: None  Testing/Procedures: None  Follow-Up: At [facility], you and your health needs are our priority.  As part of our continuing mission to provide you with exceptional heart care, we have created designated Provider Care Teams.  These Care Teams include your primary Cardiologist (physician) and Advanced Practice  Providers (APPs -  Physician Assistants and Nurse Practitioners) who all work together to provide you with the care you need, when you need it. You will need a follow up appointment in 2 months.  Please call our  office 2 months in advance to schedule this appointment.  You may see Dr. Harrell Gave or one of the following Advanced Practice Providers on your designated Care Team:   Rosaria Ferries, PA-C . Jory Sims, DNP, ANP      Disposition:  Follow up in 2 month(s)  Signed, Shannon Dresser, MD  03/13/2019 8:31 AM    [place]

## 2019-03-13 NOTE — Patient Instructions (Signed)
Medication Instructions:  Your Physician recommend you continue on your current medication as directed.    If you need a refill on your cardiac medications before your next appointment, please call your pharmacy.   Lab work: None  Testing/Procedures: None  Follow-Up: At [facility], you and your health needs are our priority.  As part of our continuing mission to provide you with exceptional heart care, we have created designated Provider Care Teams.  These Care Teams include your primary Cardiologist (physician) and Advanced Practice Providers (APPs -  Physician Assistants and Nurse Practitioners) who all work together to provide you with the care you need, when you need it. You will need a follow up appointment in 2 months.  Please call our office 2 months in advance to schedule this appointment.  You may see Dr. Harrell Gave or one of the following Advanced Practice Providers on your designated Care Team:   Rosaria Ferries, PA-C . Jory Sims, DNP, ANP

## 2019-03-19 ENCOUNTER — Telehealth: Payer: Self-pay | Admitting: Cardiology

## 2019-03-19 NOTE — Telephone Encounter (Signed)
Called to discuss blood pressure readings. First daily readings are before meds, second line of readings are 2 hours after taking BP medication.  4/1  131/68, 80  155/81, 69 4/2 143/75, 69  133/70, 71  4/3 139/79, 69  142/75, 77  4/4 137/73, 70  148/69, 67  4/5 113/70, 68  4/6 144/68, 67 then 123/64, 65 after deep breaths  144/76, 70 then 138/71, 69 after breaths  No chest pain, but continues to have mild shortness of breath after climbing several flights of stairs. Normal echo in 11/2018.    She will continue to try to increase her exercise tolerance, but if the symptoms persist she will let me know. Also instructed her to discuss with her PCP to see if she would benefit from pulmonary function tests.  Goal BP <130/80, has achieved this on several occasions. Will continue to monitor. Will plan to follow up via phone in 1 month  Buford Dresser, MD, PhD [facility]  [facility]

## 2019-04-04 ENCOUNTER — Telehealth (HOSPITAL_COMMUNITY): Payer: Self-pay | Admitting: Radiology

## 2019-04-04 ENCOUNTER — Telehealth: Payer: Self-pay | Admitting: Student

## 2019-04-04 NOTE — Telephone Encounter (Signed)
Patient called wanting to know about her follow-up and to let us know she is just about out of her Brilinta. Per Dr. Estanislado Pandy the patient is good for follow-up after June 1st and Brilinta refill will be called in to her pharmacy. JM

## 2019-04-04 NOTE — Telephone Encounter (Signed)
Received message from patient requesting medication refill. Patient is currently taking Brilinta 90 mg tablets twice daily.  [facility] in [place], [place] [phone]) at 1412 to fill prescription- Brilinta 90 mg tablets, take on tablet by mouth twice daily, dispense 60 tablets with 3 refills.  Bea Graff Louk, PA-C 04/04/2019, 2:18 PM

## 2019-04-09 ENCOUNTER — Other Ambulatory Visit: Payer: Self-pay | Admitting: *Deleted

## 2019-04-09 NOTE — Patient Outreach (Signed)
[facility]) Care Management  04/09/2019  Shannon [name] 06-24-1946 [id number]   Care coordination  [facility] RN CM received a voice message from this patient on 04/09/2019 [id number] requesting  A return call related to a medicare appeal decision [facility] RN CM returned a call to [name] Patient is able to verify HIPAA She reviewed the letter she had received from CMS with RN CM stating the surgery on 01/04/19 "was denied" She voiced concern about why her anesthesiologist was accepted and the surgery was not. CM discussed with her that all procedures and providers are billed separately.  She voiced understanding.   [place] RN CM reviewed with her that RN CM's contact with her was for EMMI f/u services and discussed [facility] in relation to partnering with [place] for these EMMI services and not billing/ She voiced understanding CM apologized for her having to go through this experience and her frustration  She was encouraged to contact the number listed on her CMS letter for further explanation and questions.  She was encouraged to have her recent EOBs (explanation of benefits) near her during these calls for reference She also was provided with [facility]'s contact information for questions about billing and financial assistance. Call [facility] at [phone] or [phone]   Consent: [facility] RN CM reviewed [facility] services with patient. Patient gave verbal consent for services.   Leeanna Slaby L. Lavina Hamman, RN, BSN, [place] Coordinator Office number [phone] Mobile number [phone]  Main [facility] number [phone] Fax number [phone]

## 2019-04-20 ENCOUNTER — Encounter: Payer: Self-pay | Admitting: Cardiology

## 2019-04-20 ENCOUNTER — Telehealth (INDEPENDENT_AMBULATORY_CARE_PROVIDER_SITE_OTHER): Payer: Medicare Other | Admitting: Cardiology

## 2019-04-20 VITALS — BP 118/66 | HR 68 | Ht 65.0 in | Wt 115.0 lb

## 2019-04-20 DIAGNOSIS — Z7189 Other specified counseling: Secondary | ICD-10-CM

## 2019-04-20 DIAGNOSIS — Z8673 Personal history of transient ischemic attack (TIA), and cerebral infarction without residual deficits: Secondary | ICD-10-CM

## 2019-04-20 DIAGNOSIS — I10 Essential (primary) hypertension: Secondary | ICD-10-CM | POA: Diagnosis not present

## 2019-04-20 NOTE — Patient Instructions (Signed)

## 2019-04-20 NOTE — Progress Notes (Signed)
Virtual Visit via Video Note   This visit type was conducted due to national recommendations for restrictions regarding the COVID-19 Pandemic (e.g. social distancing) in an effort to limit this patient's exposure and mitigate transmission in our community.  Due to her co-morbid illnesses, this patient is at least at moderate risk for complications without adequate follow up.  This format is felt to be most appropriate for this patient at this time.  All issues noted in this document were discussed and addressed.  A limited physical exam was performed with this format.  Please refer to the patient's chart for her consent to telehealth for [facility].   Date:  04/20/2019   ID:  Shannon [name], DOB 18-Aug-1946, MRN [id number]  Patient Location: Home Provider Location: Home  PCP:  Donalynn Furlong, MD  Cardiologist:  Buford Dresser, MD  Electrophysiologist:  None   Evaluation Performed:  Follow-Up Visit  Chief Complaint:  Follow up  History of Present Illness:    Shannon [name] is a 73 y.o. female with hypertension, CVA.  The patient does not have symptoms concerning for COVID-19 infection (fever, chills, cough, or new shortness of breath).   Concerns today: BP readings: BP readings have been good avg 120s/60-70, HR has been up to 98 recently.  Trying to walk dog every day, 30 min per day. Once she gets to about 2 miles gets very tired and feels tired the rest of the day.   Denies chest pain, shortness of breath at rest or with normal exertion. No PND, orthopnea, or unexpected weight gain. No syncope or palpitations. Occasional mild LE edema in she is sedentary (sock lines).   Past Medical History:  Diagnosis Date  . 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 Cancer ([place])    skin  . Complication of anesthesia    " I had difficulty coming out of it"  . Family history of adverse reaction to anesthesia    " My daughter has a problem coming ou to it"  . GERD (gastroesophageal reflux  disease)   . Hypertension   . Stroke ([place])   . Thyroid disease    Past Surgical History:  Procedure Laterality Date  . ABDOMINAL HYSTERECTOMY    . APPENDECTOMY    . CARPAL TUNNEL RELEASE Bilateral   . COLONOSCOPY    . DILATION AND CURETTAGE OF UTERUS    . IR ANGIO INTRA EXTRACRAN SEL COM CAROTID INNOMINATE BILAT MOD SED  12/25/2018  . IR ANGIO VERTEBRAL SEL SUBCLAVIAN INNOMINATE UNI L MOD SED  12/25/2018  . IR ANGIO VERTEBRAL SEL SUBCLAVIAN INNOMINATE UNI R MOD SED  01/04/2019  . IR ANGIO VERTEBRAL SEL VERTEBRAL UNI R MOD SED  12/25/2018  . IR CT HEAD LTD  01/04/2019  . IR INTRA CRAN STENT  01/04/2019  . IR US GUIDE VASC ACCESS RIGHT  12/25/2018  . OVARIAN CYST REMOVAL    . RADIOLOGY WITH ANESTHESIA N/A 01/04/2019   Procedure: RMCA angioplasty with possible stenting;  Surgeon: Luanne Bras, MD;  Location: [facility];  Service: Radiology;  Laterality: N/A;     Current Meds  Medication Sig  . aluminum hydroxide-magnesium carbonate (GAVISCON) 95-358 MG/15ML SUSP Take 5 mLs by mouth as needed.  Marland Kitchen aspirin EC 81 MG EC tablet Take 1 tablet (81 mg total) by mouth daily.  Marland Kitchen atorvastatin (LIPITOR) 40 MG tablet Take 1 tablet (40 mg total) by mouth daily at 6 PM.  . calcium carbonate (OS-CAL) 600 MG tablet Take 1,500 tablets  by mouth daily. Take 1500 daily  . Coenzyme Q10 50 MG CAPS Take 50 mg by mouth daily.   . famotidine-calcium carbonate-magnesium hydroxide (PEPCID COMPLETE) 10-800-165 MG chewable tablet Chew 1 tablet by mouth daily as needed.  Marland Kitchen levothyroxine (SYNTHROID, LEVOTHROID) 50 MCG tablet Take 1 tablet by mouth 3 (three) times daily.   Marland Kitchen levothyroxine (SYNTHROID, LEVOTHROID) 75 MCG tablet Take 1 tablet by mouth 4 (four) times a week.   Marland Kitchen lisinopril (PRINIVIL,ZESTRIL) 10 MG tablet Take 10 mg by mouth daily.  Marland Kitchen loratadine (CLARITIN) 10 MG tablet Take 10 mg by mouth daily.  . Multiple Vitamin (MULTIVITAMIN) capsule Take 1 capsule by mouth daily.  . ticagrelor (BRILINTA) 90 MG TABS tablet  Take 1 tablet (90 mg total) by mouth 2 (two) times daily.  . vitamin E 400 UNIT capsule Take 400 Units by mouth every other day.      Allergies:   Other; Sulfa antibiotics; and Mandol [cefamandole]   Social History   Tobacco Use  . Smoking status: Never Smoker  . Smokeless tobacco: Never Used  Substance Use Topics  . Alcohol use: Never    Frequency: Never  . Drug use: Never     Family Hx: The patient's family history includes CVA in her maternal aunt and mother; Heart disease in her mother; Peripheral Artery Disease in her father.  ROS:   Please see the history of present illness.    Constitutional: Negative for chills, fever, night sweats, unintentional weight loss  HENT: Negative for ear pain and hearing loss.   Eyes: Negative for loss of vision and eye pain.  Respiratory: Negative for cough, sputum, wheezing.   Cardiovascular: See HPI. Gastrointestinal: Negative for abdominal pain, melena, and hematochezia.  Genitourinary: Negative for dysuria and hematuria.  Musculoskeletal: Negative for falls and myalgias.  Skin: Negative for itching and rash.  Neurological: Negative for focal weakness, focal sensory changes and loss of consciousness.  Endo/Heme/Allergies: Does bruise/bleed easily.  All other systems reviewed and are negative.   Prior CV studies:   The following studies were reviewed today: Echo, carotids  Labs/Other Tests and Data Reviewed:    EKG:  An ECG dated 12/24/18 was personally reviewed today and demonstrated:  SR with sinus arrhythmia  Recent Labs: 12/08/2018: TSH 1.901 12/24/2018: ALT 21 01/05/2019: BUN 7; Creatinine, Ser 0.66; Hemoglobin 11.9; Platelets 279; Potassium 3.7; Sodium 139   Recent Lipid Panel Lab Results  Component Value Date/Time   CHOL 199 12/09/2018 03:52 AM   TRIG 110 12/09/2018 03:52 AM   HDL 45 12/09/2018 03:52 AM   CHOLHDL 4.4 12/09/2018 03:52 AM   LDLCALC 132 (H) 12/09/2018 03:52 AM    Wt Readings from Last 3 Encounters:   04/20/19 115 lb (52.2 kg)  03/13/19 113 lb (51.3 kg)  02/07/19 113 lb 12.8 oz (51.6 kg)     Objective:    Vital Signs:  BP 118/66   Pulse 68   Ht 5\' 5"  (1.651 m)   Wt 115 lb (52.2 kg)   BMI 19.14 kg/m    VITAL SIGNS:  reviewed GEN:  no acute distress EYES:  sclerae anicteric, EOMI - Extraocular Movements Intact RESPIRATORY:  normal respiratory effort, symmetric expansion CARDIOVASCULAR:  no peripheral edema SKIN:  no rash, lesions or ulcers. MUSCULOSKELETAL:  no obvious deformities. NEURO:  alert and oriented x 3, no obvious focal deficit PSYCH:  normal affect  ASSESSMENT & PLAN:    1.  Hypertension:  -readings much better controlled today. Continue lisinopril. -given the  improvement in her BP control, do not think we need to pursue an ambulatory BP cuff at this time.  -instructed on red flag warning signs that need immediate medical attention -would avoid beta blockers in the future given her fatigue. Heart rate range has been appropriate. -instructed to take BP readings after medications. Does not need to be multiple times/day unless she is feeling poorly. Ok if it is not every day. Contact us if she notices increasing BP readings or with any other concerns.  2. History of CVA, with need for secondary prevention: -on aspirin, ticagrelor. Ticagrelor duration per interventional neurology -on high intensity atorvastatin -echo 12/09/18 was normal, negative bubble study  COVID-19 Education: The signs and symptoms of COVID-19 were discussed with the patient and how to seek care for testing (follow up with PCP or arrange E-visit).  The importance of social distancing was discussed today.  Time:   Today, I have spent 18 minutes with the patient with telehealth technology discussing the above problems.    Patient Instructions  Medication Instructions:  Your Physician recommend you continue on your current medication as directed.    If you need a refill on your cardiac  medications before your next appointment, please call your pharmacy.   Lab work: None  Testing/Procedures: None  Follow-Up: At [facility], you and your health needs are our priority.  As part of our continuing mission to provide you with exceptional heart care, we have created designated Provider Care Teams.  These Care Teams include your primary Cardiologist (physician) and Advanced Practice Providers (APPs -  Physician Assistants and Nurse Practitioners) who all work together to provide you with the care you need, when you need it. You will need a follow up appointment in 6 months.  Please call our office 2 months in advance to schedule this appointment.  You may see Buford Dresser, MD or one of the following Advanced Practice Providers on your designated Care Team:   Rosaria Ferries, PA-C . Jory Sims, DNP, ANP        Medication Adjustments/Labs and Tests Ordered: Current medicines are reviewed at length with the patient today.  Concerns regarding medicines are outlined above.   Tests Ordered: No orders of the defined types were placed in this encounter.   Medication Changes: No orders of the defined types were placed in this encounter.   Disposition:  Follow up 6 mos  Signed, Buford Dresser, MD  04/20/2019 3:24 PM    [facility]

## 2019-06-12 ENCOUNTER — Ambulatory Visit: Payer: Medicare Other | Admitting: Cardiology

## 2019-08-06 ENCOUNTER — Telehealth: Payer: Self-pay | Admitting: Student

## 2019-08-06 ENCOUNTER — Telehealth: Payer: Self-pay | Admitting: Neurology

## 2019-08-06 NOTE — Telephone Encounter (Signed)
Pt would like RN to call her back to discuss her medications. Please advise.

## 2019-08-06 NOTE — Telephone Encounter (Signed)
NIR.  Received call from patient requesting Brilinta refill.  [facility] in [place], [place] at [phone] to fill prescription- Brilinta 90 mg tablets, take one tablet by mouth twice daily, dispense 60 tablets with 3 refills.   Bea Graff Louk, PA-C 08/06/2019, 9:46 AM

## 2019-08-06 NOTE — Telephone Encounter (Signed)
I called pt about her brilinta medication. She had an appt with Dr.Sethi but had to r/s till 10/2019 due to her husband having a health issue. Pt wanted to discuss the refill and if she would be continue on it. I advise pt that Dr.Devshwar office should be contacted about the brilinita and the refills. I gave her their number to discuss. PT verbalized understanding.

## 2019-08-08 ENCOUNTER — Ambulatory Visit: Payer: Medicare Other | Admitting: Neurology

## 2019-09-05 ENCOUNTER — Telehealth (HOSPITAL_COMMUNITY): Payer: Self-pay

## 2019-09-05 NOTE — Telephone Encounter (Signed)
Called to schedule f/u cta head/neck, no answer, left vm. AW 

## 2019-09-10 ENCOUNTER — Other Ambulatory Visit (HOSPITAL_COMMUNITY): Payer: Self-pay | Admitting: Interventional Radiology

## 2019-09-10 DIAGNOSIS — I771 Stricture of artery: Secondary | ICD-10-CM

## 2019-09-30 ENCOUNTER — Other Ambulatory Visit: Payer: Self-pay

## 2019-09-30 ENCOUNTER — Encounter (HOSPITAL_COMMUNITY): Payer: Self-pay | Admitting: Emergency Medicine

## 2019-09-30 ENCOUNTER — Emergency Department (HOSPITAL_COMMUNITY)
Admission: EM | Admit: 2019-09-30 | Discharge: 2019-09-30 | Disposition: A | Discharge status: Home / Self Care | Payer: Medicare Other | Attending: Emergency Medicine | Admitting: Emergency Medicine

## 2019-09-30 DIAGNOSIS — I10 Essential (primary) hypertension: Secondary | ICD-10-CM | POA: Diagnosis present

## 2019-09-30 DIAGNOSIS — R233 Spontaneous ecchymoses: Secondary | ICD-10-CM | POA: Insufficient documentation

## 2019-09-30 DIAGNOSIS — Z7982 Long term (current) use of aspirin: Secondary | ICD-10-CM | POA: Diagnosis not present

## 2019-09-30 DIAGNOSIS — Z79899 Other long term (current) drug therapy: Secondary | ICD-10-CM | POA: Insufficient documentation

## 2019-09-30 LAB — CBC
HCT: 39.5 % (ref 36.0–46.0)
Hemoglobin: 13.2 g/dL (ref 12.0–15.0)
MCH: 31.4 pg (ref 26.0–34.0)
MCHC: 33.4 g/dL (ref 30.0–36.0)
MCV: 93.8 fL (ref 80.0–100.0)
Platelets: 185 10*3/uL (ref 150–400)
RBC: 4.21 MIL/uL (ref 3.87–5.11)
RDW: 11.8 % (ref 11.5–15.5)
WBC: 4 10*3/uL (ref 4.0–10.5)
nRBC: 0 % (ref 0.0–0.2)

## 2019-09-30 LAB — BASIC METABOLIC PANEL
Anion gap: 10 (ref 5–15)
BUN: 18 mg/dL (ref 8–23)
CO2: 23 mmol/L (ref 22–32)
Calcium: 9.5 mg/dL (ref 8.9–10.3)
Chloride: 107 mmol/L (ref 98–111)
Creatinine, Ser: 0.79 mg/dL (ref 0.44–1.00)
GFR calc Af Amer: >60 mL/min (ref >60)
GFR calc non Af Amer: >60 mL/min (ref >60)
Glucose, Bld: 107 mg/dL — ABNORMAL HIGH (ref 70–99)
Potassium: 4.1 mmol/L (ref 3.5–5.1)
Sodium: 140 mmol/L (ref 135–145)

## 2019-09-30 MED ORDER — SODIUM CHLORIDE 0.9% FLUSH: 3.0000 mL | Freq: Once | INTRAVENOUS | Status: DC

## 2019-09-30 NOTE — Discharge Instructions (Signed)
Findings were reassuring today.  Please follow-up with your primary care provider for any further assessment. Have your primary care provider reassess your blood pressure and your medication effectiveness. Return to the emergency department for chest pain, shortness of breath, vision changes, weakness, numbness, passing out, or any other major concerns.

## 2019-09-30 NOTE — ED Triage Notes (Signed)
Pt reports a popped blood vessel next to her left eye and noticed her BP elevated. Endorses a HA earlier today. States she has had a stroke and wants to be assessed for another one today.

## 2019-09-30 NOTE — ED Provider Notes (Signed)
[facility] EMERG[facility]Y DEPARTMENT Provider Note   CSN: [id number] Arrival date & time: 09/30/19  1815     History   Chief Complaint Chief Complaint  Patient presents with  . Hypertension    HPI Shannon [name] is a 73 y.o. female.     HPI   Shannon [name] is a 73 y.o. female, with a history of GERD, HTN, stroke, presenting to the ED with concern for stroke.  She states she was at rest when she noted her eyes felt dry.  She went to look in the mirror and noted a small area of bruising lateral to the left eye.  After noting this, she took her blood pressure and noted it to be 190/90. She states she has had quite a stressful last couple days and has experienced intermittent, mild to moderate headaches.  Denies fever/chills, N/V/D, chest pain, shortness of breath, abdominal pain, cough, vision changes, epistaxis, dizziness, syncope, speech difficulties, cognitive difficulties, falls/trauma, or any other complaints.      Past Medical History:  Diagnosis Date  . 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 Cancer ([place])    skin  . Complication of anesthesia    " I had difficulty coming out of it"  . Family history of adverse reaction to anesthesia    " My daughter has a problem coming ou to it"  . GERD (gastroesophageal reflux disease)   . Hypertension   . Stroke ([place])   . Thyroid disease     Patient Active Problem List   Diagnosis Date Noted  . History of CVA (cerebrovascular accident) 03/13/2019  . 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 Influenza B   . Hypothyroidism 12/25/2018  . TIA (transient ischemic attack)   . CVA (cerebral vascular accident) ([place]) 12/08/2018  . Essential hypertension 01/16/2015    Past Surgical History:  Procedure Laterality Date  . ABDOMINAL HYSTERECTOMY    . APPENDECTOMY    . CARPAL TUNNEL RELEASE Bilateral   . COLONOSCOPY    . DILATION AND CURETTAGE OF UTERUS    . IR ANGIO INTRA EXTRACRAN SEL COM CAROTID  INNOMINATE BILAT MOD SED  12/25/2018  . IR ANGIO VERTEBRAL SEL SUBCLAVIAN INNOMINATE UNI L MOD SED  12/25/2018  . IR ANGIO VERTEBRAL SEL SUBCLAVIAN INNOMINATE UNI R MOD SED  01/04/2019  . IR ANGIO VERTEBRAL SEL VERTEBRAL UNI R MOD SED  12/25/2018  . IR CT HEAD LTD  01/04/2019  . IR INTRA CRAN STENT  01/04/2019  . IR US GUIDE VASC ACCESS RIGHT  12/25/2018  . OVARIAN CYST REMOVAL    . RADIOLOGY WITH ANESTHESIA N/A 01/04/2019   Procedure: RMCA angioplasty with possible stenting;  Surgeon: Luanne Bras, MD;  Location: [facility];  Service: Radiology;  Laterality: N/A;     OB History   No obstetric history on file.      Home Medications    Prior to Admission medications   Medication Sig Start Date End Date Taking? Authorizing Provider  aluminum hydroxide-magnesium carbonate (GAVISCON) 95-358 MG/15ML SUSP Take 5 mLs by mouth as needed.    [provider]  aspirin EC 81 MG EC tablet Take 1 tablet (81 mg total) by mouth daily. 12/10/18   Mayo, Pete Pelt, MD  atorvastatin (LIPITOR) 40 MG tablet Take 1 tablet (40 mg total) by mouth daily at 6 PM. 12/09/18   Mayo, Pete Pelt, MD  calcium carbonate (OS-CAL) 600 MG tablet Take 1,500 tablets by mouth daily. Take 1500 daily  [provider]  Coenzyme Q10 50 MG CAPS Take 50 mg by mouth daily.     [provider]  famotidine-calcium carbonate-magnesium hydroxide (PEPCID COMPLETE) 10-800-165 MG chewable tablet Chew 1 tablet by mouth daily as needed.    [provider]  levothyroxine (SYNTHROID, LEVOTHROID) 50 MCG tablet Take 1 tablet by mouth 3 (three) times daily.  04/24/12   [provider]  levothyroxine (SYNTHROID, LEVOTHROID) 75 MCG tablet Take 1 tablet by mouth 4 (four) times a week.  04/24/12   [provider]  lisinopril (PRINIVIL,ZESTRIL) 10 MG tablet Take 10 mg by mouth daily.    [provider]  loratadine (CLARITIN) 10 MG tablet Take 10 mg by mouth daily.    [provider]   Multiple Vitamin (MULTIVITAMIN) capsule Take 1 capsule by mouth daily. 04/24/12   [provider]  ticagrelor (BRILINTA) 90 MG TABS tablet Take 1 tablet (90 mg total) by mouth 2 (two) times daily. 12/27/18   Rehman, Areeg N, DO  vitamin E 400 UNIT capsule Take 400 Units by mouth every other day.     [provider]    Family History Family History  Problem Relation Age of Onset  . CVA Mother   . Heart disease Mother   . Peripheral Artery Disease Father   . CVA Maternal Aunt     Social History Social History   Tobacco Use  . Smoking status: Never Smoker  . Smokeless tobacco: Never Used  Substance Use Topics  . Alcohol use: Never    Frequency: Never  . Drug use: Never     Allergies   Other, Sulfa antibiotics, and Mandol [cefamandole]   Review of Systems Review of Systems  Constitutional: Negative for chills, diaphoresis and fever.  HENT:       Small area of bruising to left side of face.  Respiratory: Negative for cough and shortness of breath.   Cardiovascular: Negative for chest pain and leg swelling.  Gastrointestinal: Negative for abdominal pain, diarrhea, nausea and vomiting.  Musculoskeletal: Negative for neck pain and neck stiffness.  Neurological: Positive for headaches (resolved). Negative for dizziness, syncope, facial asymmetry, speech difficulty, weakness, light-headedness and numbness.  All other systems reviewed and are negative.    Physical Exam Updated Vital Signs BP (!) 176/63 (BP Location: Right Arm)   Pulse 80   Resp 18   SpO2 100%   Physical Exam Vitals signs and nursing note reviewed.  Constitutional:      General: She is not in acute distress.    Appearance: She is well-developed. She is not diaphoretic.  HENT:     Head: Normocephalic and atraumatic.     Comments: Small area of bruising discoloration to the left zygomatic region of face, as shown in the photos.  Area is nontender.  No swelling, deformity, or instability.     Mouth/Throat:     Mouth: Mucous membranes are moist.     Pharynx: Oropharynx is clear.  Eyes:     Extraocular Movements: Extraocular movements intact.     Conjunctiva/sclera: Conjunctivae normal.     Pupils: Pupils are equal, round, and reactive to light.     Comments: No pain with EOMs.  No fullness, color change, or edema noted to the conjunctiva.  Patient denies vision changes during EOMs.  Neck:     Musculoskeletal: Neck supple.  Cardiovascular:     Rate and Rhythm: Normal rate and regular rhythm.     Pulses: Normal pulses.  Radial pulses are 2+ on the right side and 2+ on the left side.       Posterior tibial pulses are 2+ on the right side and 2+ on the left side.     Heart sounds: Normal heart sounds.     Comments: Tactile temperature in the extremities appropriate and equal bilaterally. Pulmonary:     Effort: Pulmonary effort is normal. No respiratory distress.     Breath sounds: Normal breath sounds.  Abdominal:     Palpations: Abdomen is soft.     Tenderness: There is no abdominal tenderness. There is no guarding.  Musculoskeletal:     Right lower leg: No edema.     Left lower leg: No edema.  Lymphadenopathy:     Cervical: No cervical adenopathy.  Skin:    General: Skin is warm and dry.  Neurological:     Mental Status: She is alert and oriented to person, place, and time.     Comments: Sensation grossly intact to light touch in the extremities. No noted speech deficits. No aphasia. Patient handles oral secretions without difficulty. No noted swallowing defects.  Equal grip strength bilaterally. No arm drift. Strength 5/5 in the upper extremities. Strength 5/5 in the lower extremities.  No gait disturbance.  Coordination intact. Cranial nerves III-XII grossly intact.  No noted visual field deficit. No facial droop.    Psychiatric:        Mood and Affect: Mood and affect normal.        Speech: Speech normal.        Behavior: Behavior normal.                ED Treatments / Results  Labs (all labs ordered are listed, but only abnormal results are displayed) Labs Reviewed  BASIC METABOLIC PANEL - Abnormal; Notable for the following components:      Result Value   Glucose, Bld 107 (*)    All other components within normal limits  CBC  URINALYSIS, ROUTINE W REFLEX MICROSCOPIC    EKG None  Radiology No results found.  Procedures Procedures (including critical care time)  Medications Ordered in ED Medications  sodium chloride flush (NS) 0.9 % injection 3 mL (3 mLs Intravenous Not Given 09/30/19 2138)     Initial Impression / Assessment and Plan / ED Course  I have reviewed the triage vital signs and the nursing notes.  Pertinent labs & imaging results that were available during my care of the patient were reviewed by me and considered in my medical decision making (see chart for details).        Patient presents with an area of bruising to the left side of the face.  She also has concerns over her blood pressure. Patient is nontoxic appearing, afebrile, not tachycardic, not tachypneic, not hypotensive, maintains excellent SPO2 on room air, and is in no apparent distress.  Blood pressure improved during patient's ED course. No neurologic deficits on exam.  No findings consistent with acute stroke. Advised to follow-up with her PCP to reassess her blood pressure management needs. Strict return precautions discussed.  Patient voices understanding of these instructions, accepts the plan, and is comfortable with discharge.  Findings and plan of care discussed with Julianne Rice, MD.    Vitals:   09/30/19 1825 09/30/19 2120 09/30/19 2235  BP: (!) 184/83 (!) 176/63 (!) 154/58  Pulse: 91 80 85  Resp: 16 18 16   TempSrc: Oral    SpO2: 100% 100% 99%  Final Clinical Impressions(s) / ED Diagnoses   Final diagnoses:  Hypertension, unspecified type    ED Discharge Orders    None       Layla Maw 10/01/19 0118    Julianne Rice, MD 10/02/19 2227

## 2019-10-15 ENCOUNTER — Other Ambulatory Visit: Payer: Self-pay

## 2019-10-15 ENCOUNTER — Ambulatory Visit (HOSPITAL_COMMUNITY)
Admission: RE | Admit: 2019-10-15 | Discharge: 2019-10-15 | Disposition: A | Discharge status: Home / Self Care | Payer: Medicare Other | Source: Ambulatory Visit | Attending: Interventional Radiology | Admitting: Interventional Radiology

## 2019-10-15 DIAGNOSIS — I771 Stricture of artery: Secondary | ICD-10-CM | POA: Insufficient documentation

## 2019-10-15 MED ORDER — IOHEXOL 350 MG/ML SOLN
75.0000 mL | Freq: Once | INTRAVENOUS | Status: AC | PRN
  Administered 2019-10-15: 75 mL via INTRAVENOUS

## 2019-10-16 ENCOUNTER — Other Ambulatory Visit (HOSPITAL_COMMUNITY): Payer: Self-pay | Admitting: Interventional Radiology

## 2019-10-16 DIAGNOSIS — I771 Stricture of artery: Secondary | ICD-10-CM

## 2019-10-17 ENCOUNTER — Ambulatory Visit (INDEPENDENT_AMBULATORY_CARE_PROVIDER_SITE_OTHER): Payer: Medicare Other | Admitting: Cardiology

## 2019-10-17 ENCOUNTER — Other Ambulatory Visit: Payer: Self-pay

## 2019-10-17 ENCOUNTER — Encounter: Payer: Self-pay | Admitting: Cardiology

## 2019-10-17 VITALS — BP 174/88 | HR 66 | Temp 97.0°F | Ht 65.0 in | Wt 118.6 lb

## 2019-10-17 DIAGNOSIS — I10 Essential (primary) hypertension: Secondary | ICD-10-CM

## 2019-10-17 DIAGNOSIS — I639 Cerebral infarction, unspecified: Secondary | ICD-10-CM

## 2019-10-17 DIAGNOSIS — Z8673 Personal history of transient ischemic attack (TIA), and cerebral infarction without residual deficits: Secondary | ICD-10-CM | POA: Diagnosis not present

## 2019-10-17 DIAGNOSIS — Z7189 Other specified counseling: Secondary | ICD-10-CM | POA: Diagnosis not present

## 2019-10-17 MED ORDER — AMLODIPINE BESYLATE 2.5 MG PO TABS: 2.5000 mg | ORAL_TABLET | Freq: Every day | ORAL | 11 refills | Status: DC

## 2019-10-17 NOTE — Progress Notes (Signed)
Cardiology Office Note:    Date:  10/17/2019   ID:  Shannon [name], DOB May 08, 1946, MRN [id number]  PCP:  Shannon Furlong, MD  Cardiologist:  Shannon Dresser, MD  Referring MD: Shannon Furlong, MD   Cc: post ER follow up  History of Present Illness:    Shannon [name] is a 73 y.o. female with a hx of hypertension, CVA who is seen for follow up today. She was recently seen in the ER on 09/30/19 for concern for stroke, elevated blood pressure.  Today: [place] Husband had to have emergency surgery while they were there. Came home 10/17, was stressful trip back home. Got home, had bugs in the house, so had to bug bomb the house and stayed outside. Went into the house to get her eyedrops and saw a large mark under her eye. Was very upset about this, checked her blood pressure and it was very elevated, 99991111 systolic. Waited for 3 hours at ER, was very anxious during this time. BP improved over time. Was told to get CT, plans to follow up with Dr. Estanislado [name] on this.  Saw her PCP, recommended she start buspirone for anxiety as this was felt to be part of her BP issues. Has only taken one this AM, didn't feel like it helped her.  Checked BP while [place] the mountains. Would go many days in a row with good control, then have a day in the [id number] systolic. Usually running more [id number] systolic. Was walking 2 miles/day without issues.  Since being home, has been running [id number] systolic at home. Has had less routine exercise but working on getting back into exercise.   Was on norvasc in the past while she was working. Was taking amlodipine and lisinopril for a time, but was on amlodipine 10 mg at the time. Reports that her BP went down after retirement, was taken off the amlodipine.  Denies chest pain, shortness of breath at rest or with normal exertion. Does get short of breath with more intense exertion, carrying items while climbing stairs, etc. No PND,  orthopnea, LE edema or unexpected weight gain. No syncope. Feels heart beats a little fast when she is stressed. Has atypical lower left rib pain, has been associated with her hiatal hernia in the past.  Past Medical History:  Diagnosis Date  . 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 Cancer ([place])    skin  . Complication of anesthesia    " I had difficulty coming out of it"  . Family history of adverse reaction to anesthesia    " My daughter has a problem coming ou to it"  . GERD (gastroesophageal reflux disease)   . Hypertension   . Stroke ([place])   . Thyroid disease     Past Surgical History:  Procedure Laterality Date  . ABDOMINAL HYSTERECTOMY    . APPENDECTOMY    . CARPAL TUNNEL RELEASE Bilateral   . COLONOSCOPY    . DILATION AND CURETTAGE OF UTERUS    . IR ANGIO INTRA EXTRACRAN SEL COM CAROTID INNOMINATE BILAT MOD SED  12/25/2018  . IR ANGIO VERTEBRAL SEL SUBCLAVIAN INNOMINATE UNI L MOD SED  12/25/2018  . IR ANGIO VERTEBRAL SEL SUBCLAVIAN INNOMINATE UNI R MOD SED  01/04/2019  . IR ANGIO VERTEBRAL SEL VERTEBRAL UNI R MOD SED  12/25/2018  . IR CT HEAD LTD  01/04/2019  . IR INTRA CRAN STENT  01/04/2019  . IR US GUIDE VASC ACCESS RIGHT  12/25/2018  . OVARIAN CYST REMOVAL    . RADIOLOGY WITH ANESTHESIA N/A 01/04/2019   Procedure: RMCA angioplasty with possible stenting;  Surgeon: Luanne Bras, MD;  Location: [facility];  Service: Radiology;  Laterality: N/A;    Current Medications: Current Outpatient Medications on File Prior to Visit  Medication Sig  . aluminum hydroxide-magnesium carbonate (GAVISCON) 95-358 MG/15ML SUSP Take 5 mLs by mouth as needed.  Marland Kitchen aspirin EC 81 MG EC tablet Take 1 tablet (81 mg total) by mouth daily.  Marland Kitchen atorvastatin (LIPITOR) 40 MG tablet Take 1 tablet (40 mg total) by mouth daily at 6 PM.  . calcium carbonate (OS-CAL) 600 MG tablet Take 1,500 tablets by mouth daily. Take 1500 daily  . Coenzyme Q10 50 MG CAPS Take 50 mg by mouth daily.   . famotidine-calcium  carbonate-magnesium hydroxide (PEPCID COMPLETE) 10-800-165 MG chewable tablet Chew 1 tablet by mouth daily as needed.  Marland Kitchen levothyroxine (SYNTHROID, LEVOTHROID) 50 MCG tablet Take 1 tablet by mouth 3 (three) times daily.   Marland Kitchen levothyroxine (SYNTHROID, LEVOTHROID) 75 MCG tablet Take 1 tablet by mouth 4 (four) times a week.   Marland Kitchen lisinopril (PRINIVIL,ZESTRIL) 10 MG tablet Take 10 mg by mouth daily.  Marland Kitchen loratadine (CLARITIN) 10 MG tablet Take 10 mg by mouth daily.  . Multiple Vitamin (MULTIVITAMIN) capsule Take 1 capsule by mouth daily.  . ticagrelor (BRILINTA) 90 MG TABS tablet Take 1 tablet (90 mg total) by mouth 2 (two) times daily.  . vitamin E 400 UNIT capsule Take 400 Units by mouth every other day.   . busPIRone (BUSPAR) 5 MG tablet Take 5 mg by mouth 2 (two) times daily as needed.   No current facility-administered medications on file prior to visit.      Allergies:   Other, Sulfa antibiotics, and Mandol [cefamandole]   Social History   Tobacco Use  . Smoking status: Never Smoker  . Smokeless tobacco: Never Used  Substance Use Topics  . Alcohol use: Never    Frequency: Never  . Drug use: Never    Family History: family history includes CVA in her maternal aunt and mother; Heart disease in her mother; Peripheral Artery Disease in her father.  ROS:   Please see the history of present illness.  Additional pertinent ROS: Constitutional: Negative for chills, fever, night sweats, unintentional weight loss  HENT: Negative for ear pain and hearing loss.   Eyes: Negative for loss of vision and eye pain.  Respiratory: Negative for cough, sputum, wheezing.   Cardiovascular: See HPI. Gastrointestinal: Negative for abdominal pain, melena, and hematochezia.  Genitourinary: Negative for dysuria and hematuria.  Musculoskeletal: Negative for falls and myalgias.  Skin: Negative for itching and rash.  Neurological: Negative for focal weakness, focal sensory changes and loss of consciousness.   Endo/Heme/Allergies: Does not bruise/bleed easily.   EKGs/Labs/Other Studies Reviewed:    The following studies were reviewed today: Echo 12/09/2018 - Left ventricle: The cavity size was normal. Systolic function was   normal. The estimated ejection fraction was in the range of 60%   to 65%. Wall motion was normal; there were no regional wall   motion abnormalities. Left ventricular diastolic function   parameters were normal. - Left atrium: The atrium was normal in size. - Right ventricle: Systolic function was normal. - Atrial septum: No defect or patent foramen ovale was identified.   Echo contrast study showed no right-to-left atrial level shunt,   at baseline or with provocation. - Pulmonary arteries: Systolic pressure was within  the normal   range.  EKG:  EKG is personally reviewed.  The ekg ordered today demonstrates NSR at 67 bpm  Recent Labs: 12/08/2018: TSH 1.901 12/24/2018: ALT 21 09/30/2019: BUN 18; Creatinine, Ser 0.79; Hemoglobin 13.2; Platelets 185; Potassium 4.1; Sodium 140  Recent Lipid Panel    Component Value Date/Time   CHOL 199 12/09/2018 0352   TRIG 110 12/09/2018 0352   HDL 45 12/09/2018 0352   CHOLHDL 4.4 12/09/2018 0352   VLDL 22 12/09/2018 0352   LDLCALC 132 (H) 12/09/2018 0352    Physical Exam:    VS:  BP (!) 165/76   Pulse 66   Temp (!) 97 F (36.1 C)   Ht 5\' 5"  (1.651 m)   Wt 118 lb 9.6 oz (53.8 kg)   SpO2 100%   BMI 19.74 kg/m     Wt Readings from Last 3 Encounters:  10/17/19 118 lb 9.6 oz (53.8 kg)  04/20/19 115 lb (52.2 kg)  03/13/19 113 lb (51.3 kg)    GEN: Well nourished, well developed in no acute distress HEENT: Normal, moist mucous membranes NECK: No JVD CARDIAC: regular rhythm, normal S1 and S2, no rubs or gallops. No murmur. VASCULAR: Radial and DP pulses 2+ bilaterally. No carotid bruits RESPIRATORY:  Clear to auscultation without rales, wheezing or rhonchi  ABDOMEN: Soft, non-tender, non-distended MUSCULOSKELETAL:   Ambulates independently SKIN: Warm and dry, no edema NEUROLOGIC:  Alert and oriented x 3. No focal neuro deficits noted. PSYCHIATRIC:  Normal affect   ASSESSMENT:    1. Essential hypertension   2. History of CVA (cerebrovascular accident)   3. Cardiac risk counseling   4. Counseling on health promotion and disease prevention    PLAN:    Hypertension: readings remain elevated and above goal of 130/80. -on home lisinopril 10 mg. Discussed uptitrating vs starting additional med. I suspect she will need both. Will add low dose amlodipine, monitor response, then continue uptitration -reviewed home BP checking -instructed on red flag warning signs that need immediate medical attention -would avoid beta blockers in the future given her fatigue.  History of CVA, with need for secondary prevention: -on aspirin, ticagrelor. Ticagrelor duration per interventional neurology -on high intensity atorvastatin -echo 12/09/18 was normal, negative bubble study  Cardiac risk counseling and prevention recommendations: -recommend heart healthy/Mediterranean diet, with whole grains, fruits, vegetable, fish, lean meats, nuts, and olive oil. Limit salt. -recommend moderate walking, 3-5 times/week for 30-50 minutes each session. Aim for at least 150 minutes.week. Goal should be pace of 3 miles/hours, or walking 1.5 miles in 30 minutes -recommend avoidance of tobacco products. Avoid excess alcohol.  Plan for follow up: 3-4 weeks for med titration  Medication Adjustments/Labs and Tests Ordered: Current medicines are reviewed at length with the patient today.  Concerns regarding medicines are outlined above.  Orders Placed This Encounter  Procedures  . EKG 12-Lead   Meds ordered this encounter  Medications  . amLODipine (NORVASC) 2.5 MG tablet    Sig: Take 1 tablet (2.5 mg total) by mouth daily.    Dispense:  30 tablet    Refill:  11    Patient Instructions  Medication Instructions:  Start:  Amlodipine 2.5 mg daily   *If you need a refill on your cardiac medications before your next appointment, please call your pharmacy*  Lab Work: None  Testing/Procedures: None  Follow-Up: At [facility], you and your health needs are our priority.  As part of our continuing mission to provide you with exceptional  heart care, we have created designated Provider Care Teams.  These Care Teams include your primary Cardiologist (physician) and Advanced Practice Providers (APPs -  Physician Assistants and Nurse Practitioners) who all work together to provide you with the care you need, when you need it.  Your next appointment:   3-4 weeks  The format for your next appointment:   Virtual Visit   Provider:   Buford Dresser, MD      Signed, Shannon Dresser, MD PhD 10/17/2019 5:07 PM    [place]

## 2019-10-17 NOTE — Patient Instructions (Signed)
Medication Instructions:  Start: Amlodipine 2.5 mg daily   *If you need a refill on your cardiac medications before your next appointment, please call your pharmacy*  Lab Work: None  Testing/Procedures: None  Follow-Up: At [facility], you and your health needs are our priority.  As part of our continuing mission to provide you with exceptional heart care, we have created designated Provider Care Teams.  These Care Teams include your primary Cardiologist (physician) and Advanced Practice Providers (APPs -  Physician Assistants and Nurse Practitioners) who all work together to provide you with the care you need, when you need it.  Your next appointment:   3-4 weeks  The format for your next appointment:   Virtual Visit   Provider:   Buford Dresser, MD

## 2019-10-29 ENCOUNTER — Ambulatory Visit (INDEPENDENT_AMBULATORY_CARE_PROVIDER_SITE_OTHER): Payer: Medicare Other | Admitting: Neurology

## 2019-10-29 ENCOUNTER — Encounter: Payer: Self-pay | Admitting: Neurology

## 2019-10-29 ENCOUNTER — Other Ambulatory Visit: Payer: Self-pay

## 2019-10-29 ENCOUNTER — Other Ambulatory Visit: Payer: Self-pay | Admitting: Radiology

## 2019-10-29 VITALS — BP 132/72 | HR 65 | Temp 97.6°F | Ht 65.0 in | Wt 117.0 lb

## 2019-10-29 DIAGNOSIS — H8111 Benign paroxysmal vertigo, right ear: Secondary | ICD-10-CM | POA: Diagnosis not present

## 2019-10-29 DIAGNOSIS — I639 Cerebral infarction, unspecified: Secondary | ICD-10-CM

## 2019-10-29 NOTE — Progress Notes (Signed)
[facility] [address] [place]. [place] [address] [phone]       OFFICE FOLLOW UP VISIT NOTE  [name] Date of Birth:  March 09, 1946 Medical Record Number:  [id number]   Referring MD: Rosalin Hawking  Reason for Referral: Stroke  [id number] Consult 02/07/2019 : Shannon [name] is a 73 year old Caucasian lady seen today for initial office consultation visit following a stroke.  She is accompanied by her husband.  History is obtained from them and review of electronic medical records.  I personally reviewed imaging films in PACS.  Shannon [name] has a past medical history of hypertension and silent left basal ganglia infarct who was initially seen on 12/08/2018 for transient right face weakness which resolved.  At that time she was found to have high-grade right middle cerebral artery stenosis as well as acute right basal ganglia infarct.  She was also found to have moderate left supraclinoid ICA narrowing and remote age left basal ganglia and small right frontal cortical infarcts.  She was discharged on aspirin and Plavix but returned on 12/24/2018 as a code stroke for evaluation for right gaze deviation and left hemiplegia.  Her blood pressure was found to be 64 systolic and after giving IV fluids it improved to 123456 systolic.  She rapidly improved and return back to her baseline.  MRI showed right basal ganglia infarct and severe right M1 stenosis and moderate atherosclerotic changes.  Diagnostic cerebral catheter angiogram performed by Dr. Estanislado Pandy on 12/25/2018 showed severe high-grade stenosis of the right middle cerebral artery in the distal M1 segment with 50% stenosis of the left internal carotid artery supraclinoid segment, left middle cerebral artery M1 segment and to a lesser degree left anterior cerebral artery in the A1 segment.  There was also 50% stenosis of dominant right vertebral artery at its origin.  The Shannon [name] was discharged home with plans for elective angioplasty and  stenting in 2 weeks.  She will return for this and underwent right M1 angioplasty and stenting.  Postprocedure she had left facial droop and transient worsening.  MRI scan of the brain was obtained which showed slight extension of the basal ganglia infarct.  Shannon [name] was eventually discharged home and has done well.  She remains on aspirin and Brilinta which is tolerating well.  She states the facial weakness has resolved.  She still has some heaviness in in the left upper extremity and diminished fine motor skills.  She feels her stamina is not the same and she gets tired easily.  She has been compliant with all her medications but feels her blood pressure has been quite fluctuating.  She is keeping a daily blood pressure log but her primary care physician plans to check ambulatory blood pressure monitor.  She is tolerating Lipitor well without muscle aches and pains.  He has no problems with gait and balance.  She has a lot of anxiety from her condition and has not been returning to her baseline despite physically doing well. Update 10/29/2019 : She returns for follow-up after last visit with me 8 months ago.  She has not had any recurrent TIA or stroke symptoms.  She remains on aspirin and Brilinta but does complain of easy bruising and bleeding.  In fact she hit her shin a month ago and had lot of subcutaneous ecchymosis and bruising.  She had a couple of episodes which she wants to clarify and not related to strokes.  On 02/15/2019 while they were driving back from [place] she had transient  vertigo which lasted few seconds.  She later had back to the feeling passed.  She blames this on driving over winding roads.  On April 2 she had 2 brief episodes of numbness on the tip of nose which lasted only few seconds and resolved.  She also noticed an indentation in the right cheek in August which is still there.  In September she had a brief episode of feeling dizzy off balance and into the left which also did not last  long.  She recently had a follow-up CT angiogram of the brain and neck done on 10/15/2019 with Dr. Estanislado Pandy which showed patent right MCA stent but there appears to be diminutive flow distal to the stent and Dr. Estanislado Pandy plans to do a diagnostic cerebral catheter angiogram which is scheduled for tomorrow.  Shannon [name] is still on both aspirin and Brilinta and I advised her to discuss with Dr. Estanislado Pandy tomorrow if she can needs both and can come off one of them.  She remains on Lipitor which is tolerating well without side effects.  She states she did have lipid profile checked few weeks ago by primary physician but I do not have access to those labs in satisfactory.  Her blood pressure had been elevated a little bit recently and she saw her cardiologist but today it seems to be doing fine at 132/72.  She has anxiety about the procedure tomorrow but does have a prescription for of BuSpar and advised her to take it prior to the angiogram. ROS:   14 system review of systems is positive for dizziness, vertigo easy bruising, feeling cold,  anxiety and all other systems negative  PMH:  Past Medical History:  Diagnosis Date   Artery stenosis (HCC)    cerebral   Cancer ([facility])    skin   Complication of anesthesia    " I had difficulty coming out of it"   Family history of adverse reaction to anesthesia    " My daughter has a problem coming ou to it"   GERD (gastroesophageal reflux disease)    Hypertension    Stroke ([place])    Thyroid disease     Social History:  Social History   Socioeconomic History   Marital status: Married    Spouse name: Not on file   Number of children: Not on file   Years of education: Not on file   Highest education level: Not on file  Occupational History   Occupation: retired  Scientist, product/process development strain: Not on file   Food insecurity    Worry: Not on file    Inability: Not on Lexicographer needs    Medical: Not on file     Non-medical: Not on file  Tobacco Use   Smoking status: Never Smoker   Smokeless tobacco: Never Used  Substance and Sexual Activity   Alcohol use: Never    Frequency: Never   Drug use: Never   Sexual activity: Yes  Lifestyle   Physical activity    Days per week: Not on file    Minutes per session: Not on file   Stress: Not on file  Relationships   Social connections    Talks on phone: Not on file    Gets together: Not on file    Attends religious service: Not on file    Active member of club or organization: Not on file    Attends meetings of clubs or organizations: Not on file  Relationship status: Not on file   Intimate partner violence    Fear of current or ex partner: Not on file    Emotionally abused: Not on file    Physically abused: Not on file    Forced sexual activity: Not on file  Other Topics Concern   Not on file  Social History Narrative   Not on file    Medications:   Current Outpatient Medications on File Prior to Visit  Medication Sig Dispense Refill   amLODipine (NORVASC) 2.5 MG tablet Take 1 tablet (2.5 mg total) by mouth daily. 30 tablet 11   aspirin EC 81 MG EC tablet Take 1 tablet (81 mg total) by mouth daily. 30 tablet 0   atorvastatin (LIPITOR) 40 MG tablet Take 1 tablet (40 mg total) by mouth daily at 6 PM. 30 tablet 0   busPIRone (BUSPAR) 5 MG tablet Take 5 mg by mouth 2 (two) times daily as needed (anxiety).      calcium carbonate (OS-CAL) 600 MG tablet Take 1,500 tablets by mouth daily.      Coenzyme Q10 50 MG CAPS Take 50 mg by mouth daily.      famotidine-calcium carbonate-magnesium hydroxide (PEPCID COMPLETE) 10-800-165 MG chewable tablet Chew 1 tablet by mouth daily as needed (Stomach pain).      levothyroxine (SYNTHROID, LEVOTHROID) 50 MCG tablet Take 1 tablet by mouth every Monday, Wednesday, and Friday.      levothyroxine (SYNTHROID, LEVOTHROID) 75 MCG tablet Take 1 tablet by mouth 4 (four) times a week.       lisinopril (PRINIVIL,ZESTRIL) 10 MG tablet Take 10 mg by mouth daily.     loratadine (CLARITIN) 10 MG tablet Take 10 mg by mouth daily.     Multiple Vitamin (MULTIVITAMIN) capsule Take 1 capsule by mouth daily. Senior     ticagrelor (BRILINTA) 90 MG TABS tablet Take 1 tablet (90 mg total) by mouth 2 (two) times daily. 60 tablet 0   vitamin E 400 UNIT capsule Take 400 Units by mouth every other day.      No current facility-administered medications on file prior to visit.     Allergies:   Allergies  Allergen Reactions   Other Palpitations    SINUTAB   Sulfa Antibiotics Other (See Comments)    Tongue turned black   Mandol [Cefamandole] Rash    Physical Exam General: Frail elderly Caucasian lady, seated, in no evident distress Head: head normocephalic and atraumatic.   Neck: supple with no carotid or supraclavicular bruits Cardiovascular: regular rate and rhythm, no murmurs Musculoskeletal: no deformity Skin:  no rash/petichiae Vascular:  Normal pulses all extremities  Neurologic Exam Mental Status: Awake and fully alert. Oriented to place and time. Recent and remote memory intact. Attention span, concentration and fund of knowledge appropriate. Mood and affect appropriate.  Cranial Nerves: Fundoscopic exam reveals sharp disc margins. Pupils equal, briskly reactive to light. Extraocular movements full without nystagmus. Visual fields full to confrontation. Hearing intact. Facial sensation intact.  Mild left lower facial asymmetry., tongue, palate moves normally and symmetrically.  Motor: Normal bulk and tone. Normal strength in all tested extremity muscles.  Diminished fine finger movements on the left.  Orbits right over left upper extremity.  Minimum left grip weakness. Sensory.: intact to touch , pinprick , position and vibratory sensation.  Coordination: Rapid alternating movements normal in all extremities. Finger-to-nose and heel-to-shin performed accurately bilaterally.   Positive Fukuda test foot Shannon [name] moving off base and rotating to the right Gait and  Station: Arises from chair without difficulty. Stance is normal. Gait demonstrates normal stride length and balance . Able to heel, toe and tandem walk with mild  difficulty.  Reflexes: 1+ and symmetric. Toes downgoing.    ASSESSMENT: 73 year old Caucasian lady with symptomatic right middle cerebral artery stenosis in January 2020 status post elective angioplasty and stenting with good clinical results.  Vascular risk factors of hypertension hyperlipidemia and intracranial atherosclerosis.  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     PLAN: I had a long discussion with the Shannon [name] regarding prior strokes and symptomatic right MCA stenosis and angioplasty stenting.  I recommend she keep her appointment tomorrow for diagnostic cerebral angiogram with Dr. Estanislado Pandy and also discussed with him whether she still needs to be on dual antiplatelet therapy as she is having a lot of bruising and bleeding.  She will maintains strict control of blood pressure with goal below 130/90, lipids with LDL cholesterol goal below 70 mg percent and diabetes with hemoglobin A1c goal below 6.5%.  She was encouraged to eat a healthy diet with lots of fruits, vegetables, cereals and whole grains and to be active.  I also advised her to avoid sudden neck movements due to her transient positional vertigo.  If this becomes more disabling may need to consider referral to physical therapy for vestibular stabilization exercises.  She will return for follow-up in the future in 6 months with Janett Billow my nurse practitioner call earlier if necessary.Greater than 50% time during this 25-minute  visit was spent on counseling and coordination of care about her symptomatic right middle cerebral artery stenosis and discussion about angioplasty stenting and stroke prevention and answering questions Antony Contras, MD  [facility] [address] [place], [place] [address]  Phone [phone] Fax [phone] Note: This document was prepared with digital dictation and possible smart phrase technology. Any transcriptional errors that result from this process are unintentional.

## 2019-10-29 NOTE — Patient Instructions (Addendum)
I had a long discussion with the patient regarding prior strokes and symptomatic right MCA stenosis and angioplasty stenting.  I recommend she keep her appointment tomorrow for diagnostic cerebral angiogram with Dr. Estanislado Pandy and also discussed with him whether she still needs to be on dual antiplatelet therapy as she is having a lot of bruising and bleeding.  She will maintains strict control of blood pressure with goal below 130/90, lipids with LDL cholesterol goal below 70 mg percent and diabetes with hemoglobin A1c goal below 6.5%.  She was encouraged to eat a healthy diet with lots of fruits, vegetables, cereals and whole grains and to be active.  I also advised her to avoid sudden neck movements due to her transient positional vertigo.  If this becomes more disabling may need to consider referral to physical therapy for vestibular stabilization exercises.  She will return for follow-up in the future in 6 months with Janett Billow my nurse practitioner call earlier if necessary.   Benign Positional Vertigo Vertigo is the feeling that you or your surroundings are moving when they are not. Benign positional vertigo is the most common form of vertigo. This is usually a harmless condition (benign). This condition is positional. This means that symptoms are triggered by certain movements and positions. This condition can be dangerous if it occurs while you are doing something that could cause harm to you or others. This includes activities such as driving or operating machinery. What are the causes? In many cases, the cause of this condition is not known. It may be caused by a disturbance in an area of the inner ear that helps your brain to sense movement and balance. This disturbance can be caused by:  Viral infection (labyrinthitis).  Head injury.  Repetitive motion, such as jumping, dancing, or running. What increases the risk? You are more likely to develop this condition if:  You are a woman.  You  are 39 years of age or older. What are the signs or symptoms? Symptoms of this condition usually happen when you move your head or your eyes in different directions. Symptoms may start suddenly, and usually last for less than a minute. They include:  Loss of balance and falling.  Feeling like you are spinning or moving.  Feeling like your surroundings are spinning or moving.  Nausea and vomiting.  Blurred vision.  Dizziness.  Involuntary eye movement (nystagmus). Symptoms can be mild and cause only minor problems, or they can be severe and interfere with daily life. Episodes of benign positional vertigo may return (recur) over time. Symptoms may improve over time. How is this diagnosed? This condition may be diagnosed based on:  Your medical history.  Physical exam of the head, neck, and ears.  Tests, such as: ? MRI. ? CT scan. ? Eye movement tests. Your health care provider may ask you to change positions quickly while he or she watches you for symptoms of benign positional vertigo, such as nystagmus. Eye movement may be tested with a variety of exams that are designed to evaluate or stimulate vertigo. ? An electroencephalogram (EEG). This records electrical activity in your brain. ? Hearing tests. You may be referred to a health care provider who specializes in ear, nose, and throat (ENT) problems (otolaryngologist) or a provider who specializes in disorders of the nervous system (neurologist). How is this treated?  This condition may be treated in a session in which your health care provider moves your head in specific positions to adjust your inner ear  back to normal. Treatment for this condition may take several sessions. Surgery may be needed in severe cases, but this is rare. In some cases, benign positional vertigo may resolve on its own in 2-4 weeks. Follow these instructions at home: Safety  Move slowly. Avoid sudden body or head movements or certain positions, as  told by your health care provider.  Avoid driving until your health care provider says it is safe for you to do so.  Avoid operating heavy machinery until your health care provider says it is safe for you to do so.  Avoid doing any tasks that would be dangerous to you or others if vertigo occurs.  If you have trouble walking or keeping your balance, try using a cane for stability. If you feel dizzy or unstable, sit down right away.  Return to your normal activities as told by your health care provider. Ask your health care provider what activities are safe for you. General instructions  Take over-the-counter and prescription medicines only as told by your health care provider.  Drink enough fluid to keep your urine pale yellow.  Keep all follow-up visits as told by your health care provider. This is important. Contact a health care provider if:  You have a fever.  Your condition gets worse or you develop new symptoms.  Your family or friends notice any behavioral changes.  You have nausea or vomiting that gets worse.  You have numbness or a "pins and needles" sensation. Get help right away if you:  Have difficulty speaking or moving.  Are always dizzy.  Faint.  Develop severe headaches.  Have weakness in your legs or arms.  Have changes in your hearing or vision.  Develop a stiff neck.  Develop sensitivity to light. Summary  Vertigo is the feeling that you or your surroundings are moving when they are not. Benign positional vertigo is the most common form of vertigo.  The cause of this condition is not known. It may be caused by a disturbance in an area of the inner ear that helps your brain to sense movement and balance.  Symptoms include loss of balance and falling, feeling that you or your surroundings are moving, nausea and vomiting, and blurred vision.  This condition can be diagnosed based on symptoms, physical exam, and other tests, such as MRI, CT scan,  eye movement tests, and hearing tests.  Follow safety instructions as told by your health care provider. You will also be told when to contact your health care provider in case of problems. This information is not intended to replace advice given to you by your health care provider. Make sure you discuss any questions you have with your health care provider. Document Released: 09/06/2006 Document Revised: 05/10/2018 Document Reviewed: 05/10/2018 Elsevier Patient Education  2020 [employer].

## 2019-10-30 ENCOUNTER — Other Ambulatory Visit: Payer: Self-pay

## 2019-10-30 ENCOUNTER — Other Ambulatory Visit (HOSPITAL_COMMUNITY): Payer: Self-pay | Admitting: Interventional Radiology

## 2019-10-30 ENCOUNTER — Ambulatory Visit (HOSPITAL_COMMUNITY)
Admission: RE | Admit: 2019-10-30 | Discharge: 2019-10-30 | Disposition: A | Discharge status: Home / Self Care | Payer: Medicare Other | Source: Ambulatory Visit | Attending: Interventional Radiology | Admitting: Interventional Radiology

## 2019-10-30 DIAGNOSIS — E079 Disorder of thyroid, unspecified: Secondary | ICD-10-CM | POA: Insufficient documentation

## 2019-10-30 DIAGNOSIS — K219 Gastro-esophageal reflux disease without esophagitis: Secondary | ICD-10-CM | POA: Insufficient documentation

## 2019-10-30 DIAGNOSIS — Z7982 Long term (current) use of aspirin: Secondary | ICD-10-CM | POA: Diagnosis not present

## 2019-10-30 DIAGNOSIS — Z79890 Hormone replacement therapy (postmenopausal): Secondary | ICD-10-CM | POA: Diagnosis not present

## 2019-10-30 DIAGNOSIS — I6522 Occlusion and stenosis of left carotid artery: Secondary | ICD-10-CM | POA: Diagnosis not present

## 2019-10-30 DIAGNOSIS — Z79899 Other long term (current) drug therapy: Secondary | ICD-10-CM | POA: Insufficient documentation

## 2019-10-30 DIAGNOSIS — I771 Stricture of artery: Secondary | ICD-10-CM

## 2019-10-30 DIAGNOSIS — I6601 Occlusion and stenosis of right middle cerebral artery: Secondary | ICD-10-CM | POA: Insufficient documentation

## 2019-10-30 DIAGNOSIS — I10 Essential (primary) hypertension: Secondary | ICD-10-CM | POA: Insufficient documentation

## 2019-10-30 DIAGNOSIS — Z8673 Personal history of transient ischemic attack (TIA), and cerebral infarction without residual deficits: Secondary | ICD-10-CM | POA: Diagnosis not present

## 2019-10-30 HISTORY — PX: IR ANGIO VERTEBRAL SEL SUBCLAVIAN INNOMINATE UNI L MOD SED: IMG5364

## 2019-10-30 HISTORY — PX: IR ANGIO VERTEBRAL SEL VERTEBRAL UNI R MOD SED: IMG5368

## 2019-10-30 HISTORY — PX: IR ANGIO INTRA EXTRACRAN SEL COM CAROTID INNOMINATE BILAT MOD SED: IMG5360

## 2019-10-30 LAB — BASIC METABOLIC PANEL
Anion gap: 12 (ref 5–15)
BUN: 19 mg/dL (ref 8–23)
CO2: 26 mmol/L (ref 22–32)
Calcium: 9.8 mg/dL (ref 8.9–10.3)
Chloride: 104 mmol/L (ref 98–111)
Creatinine, Ser: 0.71 mg/dL (ref 0.44–1.00)
GFR calc Af Amer: >60 mL/min (ref >60)
GFR calc non Af Amer: >60 mL/min (ref >60)
Glucose, Bld: 93 mg/dL (ref 70–99)
Potassium: 4.2 mmol/L (ref 3.5–5.1)
Sodium: 142 mmol/L (ref 135–145)

## 2019-10-30 LAB — PROTIME-INR
INR: 1 (ref 0.8–1.2)
Prothrombin Time: 12.8 seconds (ref 11.4–15.2)

## 2019-10-30 LAB — CBC
HCT: 43.6 % (ref 36.0–46.0)
Hemoglobin: 14.6 g/dL (ref 12.0–15.0)
MCH: 31.5 pg (ref 26.0–34.0)
MCHC: 33.5 g/dL (ref 30.0–36.0)
MCV: 94 fL (ref 80.0–100.0)
Platelets: 199 10*3/uL (ref 150–400)
RBC: 4.64 MIL/uL (ref 3.87–5.11)
RDW: 11.7 % (ref 11.5–15.5)
WBC: 4.4 10*3/uL (ref 4.0–10.5)
nRBC: 0 % (ref 0.0–0.2)

## 2019-10-30 MED ORDER — MIDAZOLAM HCL 2 MG/2ML IJ SOLN
INTRAMUSCULAR | Status: AC
  Filled 2019-10-30: qty 2

## 2019-10-30 MED ORDER — NITROGLYCERIN 1 MG/10 ML FOR IR/CATH LAB
INTRA_ARTERIAL | Status: AC
  Filled 2019-10-30: qty 10

## 2019-10-30 MED ORDER — MIDAZOLAM HCL 2 MG/2ML IJ SOLN
INTRAMUSCULAR | Status: AC | PRN
  Administered 2019-10-30: 1 mg via INTRAVENOUS

## 2019-10-30 MED ORDER — ACETAMINOPHEN 325 MG PO TABS
650.0000 mg | ORAL_TABLET | Freq: Once | ORAL | Status: AC
  Administered 2019-10-30: 650 mg via ORAL

## 2019-10-30 MED ORDER — NITROGLYCERIN 1 MG/10 ML FOR IR/CATH LAB
INTRA_ARTERIAL | Status: AC | PRN
  Administered 2019-10-30: 200 ug via INTRA_ARTERIAL

## 2019-10-30 MED ORDER — LIDOCAINE HCL 1 % IJ SOLN
INTRAMUSCULAR | Status: AC
  Filled 2019-10-30: qty 20

## 2019-10-30 MED ORDER — SODIUM CHLORIDE 0.9 % IV SOLN
Freq: Once | INTRAVENOUS | Status: AC
  Administered 2019-10-30: 07:00:00 via INTRAVENOUS

## 2019-10-30 MED ORDER — FENTANYL CITRATE (PF) 100 MCG/2ML IJ SOLN
INTRAMUSCULAR | Status: AC
  Filled 2019-10-30: qty 2

## 2019-10-30 MED ORDER — FENTANYL CITRATE (PF) 100 MCG/2ML IJ SOLN
INTRAMUSCULAR | Status: AC | PRN
  Administered 2019-10-30: 25 ug via INTRAVENOUS

## 2019-10-30 MED ORDER — HEPARIN SODIUM (PORCINE) 1000 UNIT/ML IJ SOLN
INTRAMUSCULAR | Status: AC | PRN
  Administered 2019-10-30: 2000 [IU] via INTRAVENOUS

## 2019-10-30 MED ORDER — ACETAMINOPHEN 325 MG PO TABS
ORAL_TABLET | ORAL | Status: AC
  Filled 2019-10-30: qty 2

## 2019-10-30 MED ORDER — IOHEXOL 300 MG/ML  SOLN
150.0000 mL | Freq: Once | INTRAMUSCULAR | Status: AC | PRN
  Administered 2019-10-30: 65 mL via INTRA_ARTERIAL

## 2019-10-30 MED ORDER — IOHEXOL 300 MG/ML  SOLN
150.0000 mL | Freq: Once | INTRAMUSCULAR | Status: AC | PRN
  Administered 2019-10-30: 20 mL via INTRA_ARTERIAL

## 2019-10-30 MED ORDER — SODIUM CHLORIDE 0.9 % IV SOLN
INTRAVENOUS | Status: DC
  Administered 2019-10-30: 10:00:00 via INTRAVENOUS

## 2019-10-30 MED ORDER — VERAPAMIL HCL 2.5 MG/ML IV SOLN
INTRAVENOUS | Status: AC | PRN
  Administered 2019-10-30: 2.5 mg via INTRA_ARTERIAL

## 2019-10-30 MED ORDER — HEPARIN SODIUM (PORCINE) 1000 UNIT/ML IJ SOLN
INTRAMUSCULAR | Status: AC
  Filled 2019-10-30: qty 1

## 2019-10-30 MED ORDER — VERAPAMIL HCL 2.5 MG/ML IV SOLN
INTRAVENOUS | Status: AC
  Filled 2019-10-30: qty 2

## 2019-10-30 NOTE — Sedation Documentation (Signed)
TR band to right wrist applied with balloon at 12

## 2019-10-30 NOTE — Procedures (Signed)
S/P 4 vessel cerebral arteriogram. RT radial approach. Findings. 1.Severe RT MCA intra stent  Stenosis. 2,Severe LtVA origin stenosis. 3.%0 % :Lt MCA M1 stenosis. 4.50 to 75 % RT PCA P 1 seg stenosis. S.Jaeanna Mccomber MD

## 2019-10-30 NOTE — Discharge Instructions (Addendum)
Radial Site Care ° °This sheet gives you information about how to care for yourself after your procedure. Your health care provider may also give you more specific instructions. If you have problems or questions, contact your health care provider. °What can I expect after the procedure? °After the procedure, it is common to have: °· Bruising and tenderness at the catheter insertion area. °Follow these instructions at home: °Medicines °· Take over-the-counter and prescription medicines only as told by your health care provider. °Insertion site care °· Follow instructions from your health care provider about how to take care of your insertion site. Make sure you: °? Wash your hands with soap and water before you change your bandage (dressing). If soap and water are not available, use hand sanitizer. °? Change your dressing as told by your health care provider. °? Leave stitches (sutures), skin glue, or adhesive strips in place. These skin closures may need to stay in place for 2 weeks or longer. If adhesive strip edges start to loosen and curl up, you may trim the loose edges. Do not remove adhesive strips completely unless your health care provider tells you to do that. °· Check your insertion site every day for signs of infection. Check for: °? Redness, swelling, or pain. °? Fluid or blood. °? Pus or a bad smell. °? Warmth. °· Do not take baths, swim, or use a hot tub until your health care provider approves. °· You may shower 24-48 hours after the procedure, or as directed by your health care provider. °? Remove the dressing and gently wash the site with plain soap and water. °? Pat the area dry with a clean towel. °? Do not rub the site. That could cause bleeding. °· Do not apply powder or lotion to the site. °Activity ° °· For 24 hours after the procedure, or as directed by your health care provider: °? Do not flex or bend the affected arm. °? Do not push or pull heavy objects with the affected arm. °? Do not  drive yourself home from the hospital or clinic. You may drive 24 hours after the procedure unless your health care provider tells you not to. °? Do not operate machinery or power tools. °· Do not lift anything that is heavier than 10 lb (4.5 kg), or the limit that you are told, until your health care provider says that it is safe. °· Ask your health care provider when it is okay to: °? Return to work or school. °? Resume usual physical activities or sports. °? Resume sexual activity. °General instructions °· If the catheter site starts to bleed, raise your arm and put firm pressure on the site. If the bleeding does not stop, get help right away. This is a medical emergency. °· If you went home on the same day as your procedure, a responsible adult should be with you for the first 24 hours after you arrive home. °· Keep all follow-up visits as told by your health care provider. This is important. °Contact a health care provider if: °· You have a fever. °· You have redness, swelling, or yellow drainage around your insertion site. °Get help right away if: °· You have unusual pain at the radial site. °· The catheter insertion area swells very fast. °· The insertion area is bleeding, and the bleeding does not stop when you hold steady pressure on the area. °· Your arm or hand becomes pale, cool, tingly, or numb. °These symptoms may represent a serious problem   that is an emergency. Do not wait to see if the symptoms will go away. Get medical help right away. Call your local emergency services (911 in the [place] Do not drive yourself to the hospital. °Summary °· After the procedure, it is common to have bruising and tenderness at the site. °· Follow instructions from your health care provider about how to take care of your radial site wound. Check the wound every day for signs of infection. °· Do not lift anything that is heavier than 10 lb (4.5 kg), or the limit that you are told, until your health care provider says  that it is safe. °This information is not intended to replace advice given to you by your health care provider. Make sure you discuss any questions you have with your health care provider. °Document Released: 01/01/2011 Document Revised: 01/04/2018 Document Reviewed: 01/04/2018 °[employer] Patient Education © 2020 [employer] ° ° °Angiogram, Care After °This sheet gives you information about how to care for yourself after your procedure. Your doctor may also give you more specific instructions. If you have problems or questions, contact your doctor. °Follow these instructions at home: °Insertion site care °· Follow instructions from your doctor about how to take care of your long, thin tube (catheter) insertion area. Make sure you: °? Wash your hands with soap and water before you change your bandage (dressing). If you cannot use soap and water, use hand sanitizer. °? Change your bandage as told by your doctor. °? Leave stitches (sutures), skin glue, or skin tape (adhesive) strips in place. They may need to stay in place for 2 weeks or longer. If tape strips get loose and curl up, you may trim the loose edges. Do not remove tape strips completely unless your doctor says it is okay. °· Do not take baths, swim, or use a hot tub until your doctor says it is okay. °· You may shower 24-48 hours after the procedure or as told by your doctor. °? Gently wash the area with plain soap and water. °? Pat the area dry with a clean towel. °? Do not rub the area. This may cause bleeding. °· Do not apply powder or lotion to the area. Keep the area clean and dry. °· Check your insertion area every day for signs of infection. Check for: °? More redness, swelling, or pain. °? Fluid or blood. °? Warmth. °? Pus or a bad smell. °Activity °· Rest as told by your doctor, usually for 1-2 days. °· Do not lift anything that is heavier than 10 lbs. (4.5 kg) or as told by your doctor. °· Do not drive for 24 hours if you were given a medicine to  help you relax (sedative). °· Do not drive or use heavy machinery while taking prescription pain medicine. °General instructions ° °· Go back to your normal activities as told by your doctor, usually in about a week. Ask your doctor what activities are safe for you. °· If the insertion area starts to bleed, lie flat and put pressure on the area. If the bleeding does not stop, get help right away. This is an emergency. °· Drink enough fluid to keep your pee (urine) clear or pale yellow. °· Take over-the-counter and prescription medicines only as told by your doctor. °· Keep all follow-up visits as told by your doctor. This is important. °Contact a doctor if: °· You have a fever. °· You have chills. °· You have more redness, swelling, or pain around your insertion   area. °· You have fluid or blood coming from your insertion area. °· The insertion area feels warm to the touch. °· You have pus or a bad smell coming from your insertion area. °· You have more bruising around the insertion area. °· Blood collects in the tissue around the insertion area (hematoma) that may be painful to the touch. °Get help right away if: °· You have a lot of pain in the insertion area. °· The insertion area swells very fast. °· The insertion area is bleeding, and the bleeding does not stop after holding steady pressure on the area. °· The area near or just beyond the insertion area becomes pale, cool, tingly, or numb. °These symptoms may be an emergency. Do not wait to see if the symptoms will go away. Get medical help right away. Call your local emergency services (911 in the [place] Do not drive yourself to the hospital. °Summary °· After the procedure, it is common to have bruising and tenderness at the long, thin tube insertion area. °· After the procedure, it is important to rest and drink plenty of fluids. °· Do not take baths, swim, or use a hot tub until your doctor says it is okay to do so. You may shower 24-48 hours after the  procedure or as told by your doctor. °· If the insertion area starts to bleed, lie flat and put pressure on the area. If the bleeding does not stop, get help right away. This is an emergency. °This information is not intended to replace advice given to you by your health care provider. Make sure you discuss any questions you have with your health care provider. °Document Released: 02/25/2009 Document Revised: 11/11/2017 Document Reviewed: 11/23/2016 °[employer] Patient Education © 2020 [employer] ° ° ° °Moderate Conscious Sedation, Adult, Care After °These instructions provide you with information about caring for yourself after your procedure. Your health care provider may also give you more specific instructions. Your treatment has been planned according to current medical practices, but problems sometimes occur. Call your health care provider if you have any problems or questions after your procedure. °What can I expect after the procedure? °After your procedure, it is common: °· To feel sleepy for several hours. °· To feel clumsy and have poor balance for several hours. °· To have poor judgment for several hours. °· To vomit if you eat too soon. °Follow these instructions at home: °For at least 24 hours after the procedure: ° °· Do not: °? Participate in activities where you could fall or become injured. °? Drive. °? Use heavy machinery. °? Drink alcohol. °? Take sleeping pills or medicines that cause drowsiness. °? Make important decisions or sign legal documents. °? Take care of children on your own. °· Rest. °Eating and drinking °· Follow the diet recommended by your health care provider. °· If you vomit: °? Drink water, juice, or soup when you can drink without vomiting. °? Make sure you have little or no nausea before eating solid foods. °General instructions °· Have a responsible adult stay with you until you are awake and alert. °· Take over-the-counter and prescription medicines only as told by your  health care provider. °· If you smoke, do not smoke without supervision. °· Keep all follow-up visits as told by your health care provider. This is important. °Contact a health care provider if: °· You keep feeling nauseous or you keep vomiting. °· You feel light-headed. °· You develop a rash. °· You have a   fever. °Get help right away if: °· You have trouble breathing. °This information is not intended to replace advice given to you by your health care provider. Make sure you discuss any questions you have with your health care provider. °Document Released: 09/19/2013 Document Revised: 11/11/2017 Document Reviewed: 03/20/2016 °[employer] Patient Education © 2020 [employer] ° °

## 2019-10-30 NOTE — Progress Notes (Signed)
Shannon [name] called and informed of pt c/o head ache. New order noted

## 2019-10-30 NOTE — H&P (Signed)
Chief Complaint: Patient was seen in consultation today for image guided diagnostic cerebral angiogram.  Supervising Physician: Luanne Bras  Patient Status: [facility] - Out-pt  History of Present Illness: Shannon [name] is a 73 y.o. female with a past medical history significant for GERD, thyroid disease, HTN and CVA followed by Dr. Estanislado Pandy who presents today for an image guided diagnostic cerebral angiogram. Shannon [name] underwent a diagnostic cerebral angiogram with Dr. Estanislado Pandy on 12/25/18 after presenting to the ED with left sided weakness and right sided gaze preference. 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 She then underwent a cerebral angiogram with revascularization of the right MCA M1 segment with stent assisted angioplasty with resulting 80% patency on 01/04/19. 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 She presents today for a diagnostic cerebral angiogram to further evaluate the most recent CTA findings.  Shannon [name] reports that she continues to have bilateral neck stiffness and right sided headaches occasionally that are sharp in nature and last for several hours if she does not take Tylenol, the headache is usually resolved with OTC Tylenol. She also states that reading for too long will give her a headache and she was supposed to have cataract  surgery this year, however this was postponed due to her NIR procedure. She also endorses infrequent numbness of her nose and upper lip which lasts for approximately 10 seconds and then resolves spontaneously. She is concerned that there is an area of indentation on the right side of her face which does not match the left side. She reports frequent urge incontinence stating that she often wants to finish what she is doing before using the restroom which causes her to become incontinent. She reports having bilateral lower extremity cramps twice and having vertigo in the car once while her [place]. She reports good PO intake and adequate hydration. She states that she went to the ED a few months ago because she suddenly had an area of bleeding on her left eye and took her BP at home which was 196/90 mmHg - she states she was told the area was harmless and to monitor her BP. She states her BP at home is "all over the place," ranging from 117/70 - 196/90 however both of theses extremes occurred once and she estimates her average BP to be around 140/80 mmHg. She is currently taking Lisinopril 10 mg QD and Norvasc 2.5 mg QD which was just recently added. She continues to take Brilinta BID and ASA QD however she is wondering if she can discontinue one of these medications as she does not like how bruised she becomes from a small injury.   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   Cancer ([place])    skin   Complication of anesthesia    " I had difficulty coming out of it"   Family history  of adverse reaction to anesthesia    " My daughter has a problem coming ou to it"   GERD (gastroesophageal reflux disease)    Hypertension    Stroke [facility])    Thyroid disease     Past Surgical History:  Procedure Laterality Date   ABDOMINAL HYSTERECTOMY     APPENDECTOMY     CARPAL TUNNEL RELEASE Bilateral    COLONOSCOPY     DILATION AND  CURETTAGE OF UTERUS     IR ANGIO INTRA EXTRACRAN SEL COM CAROTID INNOMINATE BILAT MOD SED  12/25/2018   IR ANGIO VERTEBRAL SEL SUBCLAVIAN INNOMINATE UNI L MOD SED  12/25/2018   IR ANGIO VERTEBRAL SEL SUBCLAVIAN INNOMINATE UNI R MOD SED  01/04/2019   IR ANGIO VERTEBRAL SEL VERTEBRAL UNI R MOD SED  12/25/2018   IR CT HEAD LTD  01/04/2019   IR INTRA CRAN STENT  01/04/2019   IR US GUIDE VASC ACCESS RIGHT  12/25/2018   OVARIAN CYST REMOVAL     RADIOLOGY WITH ANESTHESIA N/A 01/04/2019   Procedure: RMCA angioplasty with possible stenting;  Surgeon: Luanne Bras, MD;  Location: [facility];  Service: Radiology;  Laterality: N/A;    Allergies: Other, Sulfa antibiotics, and Mandol [cefamandole]  Medications: Prior to Admission medications   Medication Sig Start Date End Date Taking? Authorizing Provider  acetaminophen (TYLENOL) 325 MG tablet Take 650 mg by mouth every 6 (six) hours as needed for headache.   Yes [provider]  amLODipine (NORVASC) 2.5 MG tablet Take 1 tablet (2.5 mg total) by mouth daily. 10/17/19 10/11/20 Yes Buford Dresser, MD  aspirin EC 81 MG EC tablet Take 1 tablet (81 mg total) by mouth daily. 12/10/18  Yes Mayo, Pete Pelt, MD  atorvastatin (LIPITOR) 40 MG tablet Take 1 tablet (40 mg total) by mouth daily at 6 PM. 12/09/18  Yes Mayo, Pete Pelt, MD  busPIRone (BUSPAR) 5 MG tablet Take 5 mg by mouth 2 (two) times daily as needed (anxiety).  10/12/19  Yes [provider]  calcium carbonate (OS-CAL) 600 MG tablet Take 1,500 tablets by mouth daily.    Yes [provider]  Coenzyme Q10 50 MG CAPS Take 50 mg by mouth daily.    Yes [provider]  famotidine-calcium carbonate-magnesium hydroxide (PEPCID COMPLETE) 10-800-165 MG chewable tablet Chew 1 tablet by mouth daily as needed (Stomach pain).    Yes [provider]  hydroxypropyl methylcellulose / hypromellose (ISOPTO TEARS / GONIOVISC) 2.5 % ophthalmic solution Place 1 drop  into both eyes as needed for dry eyes.   Yes [provider]  levothyroxine (SYNTHROID, LEVOTHROID) 50 MCG tablet Take 1 tablet by mouth every Monday, Wednesday, and Friday.  04/24/12  Yes [provider]  levothyroxine (SYNTHROID, LEVOTHROID) 75 MCG tablet Take 1 tablet by mouth 4 (four) times a week.  04/24/12  Yes [provider]  lisinopril (PRINIVIL,ZESTRIL) 10 MG tablet Take 10 mg by mouth daily.   Yes [provider]  loratadine (CLARITIN) 10 MG tablet Take 10 mg by mouth daily.   Yes [provider]  Multiple Vitamin (MULTIVITAMIN) capsule Take 1 capsule by mouth daily. Senior 04/24/12  Yes [provider]  ticagrelor (BRILINTA) 90 MG TABS tablet Take 1 tablet (90 mg total) by mouth 2 (two) times daily. 12/27/18  Yes Rehman, Areeg N, DO  vitamin E 400 UNIT capsule Take 400 Units by mouth every other day.    Yes [provider]     Family History  Problem Relation Age of Onset   CVA Mother    Heart disease Mother    Peripheral Artery Disease Father    CVA Maternal Aunt     Social History   Socioeconomic History   Marital status: Married    Spouse name: Not on file   Number of children: Not on file   Years of education: Not on file   Highest education level: Not on file  Occupational History   Occupation: retired  Scientist, product/process development strain: Not on file   Food insecurity    Worry: Not on file    Inability: Not on Lexicographer needs    Medical: Not on file    Non-medical: Not on file  Tobacco Use   Smoking status: Never Smoker   Smokeless tobacco: Never Used  Substance and Sexual Activity   Alcohol use: Never    Frequency: Never   Drug use: Never   Sexual activity: Yes  Lifestyle   Physical activity    Days per week: Not on file    Minutes per session: Not on file   Stress: Not on file  Relationships   Social connections    Talks on phone: Not on file    Gets  together: Not on file    Attends religious service: Not on file    Active member of club or organization: Not on file    Attends meetings of clubs or organizations: Not on file    Relationship status: Not on file  Other Topics Concern   Not on file  Social History Narrative   Not on file     Review of Systems: A 12 point ROS discussed and pertinent positives are indicated in the HPI above.  All other systems are negative.  Review of Systems  Constitutional: Negative for appetite change, chills and fever.  HENT: Negative for nosebleeds, tinnitus and trouble swallowing.   Eyes: Negative for photophobia, pain, redness and visual disturbance.  Respiratory: Negative for cough.   Cardiovascular: Negative for chest pain.  Gastrointestinal: Negative for abdominal pain, blood in stool, diarrhea, nausea and vomiting.  Genitourinary: Positive for urgency. Negative for dysuria and hematuria.  Musculoskeletal: Positive for neck pain and neck stiffness. Negative for back pain.  Skin: Negative for rash and wound.  Neurological: Positive for headaches. Negative for dizziness, speech difficulty, weakness and numbness.  Hematological: Bruises/bleeds easily.    Vital Signs: BP (!) 141/60    Pulse 76    Temp 97.6 F (36.4 C) (Oral)    Resp 20    Ht 5\' 5"  (1.651 m)    Wt 117 lb 3.2 oz (53.2 kg)    SpO2 99%    BMI 19.50 kg/m   Physical Exam Vitals signs reviewed.  Constitutional:      General: She is not in acute distress. HENT:     Head: Normocephalic.     Mouth/Throat:     Mouth: Mucous membranes are moist.     Pharynx: Oropharynx is clear. No oropharyngeal exudate or posterior oropharyngeal erythema.  Cardiovascular:     Rate and Rhythm: Normal rate and regular rhythm.  Pulmonary:     Effort: Pulmonary effort is normal.     Breath sounds: Normal breath sounds.  Abdominal:     General: Bowel sounds are normal. There is no distension.     Palpations: Abdomen is soft.     Tenderness:  There is no abdominal tenderness.  Skin:  General: Skin is warm and dry.  Neurological:     Mental Status: She is alert and oriented to person, place, and time.     Comments: (+) mild left sided facial droop which is at baseline per patient  Psychiatric:        Mood and Affect: Mood normal.        Behavior: Behavior normal.        Thought Content: Thought content normal.        Judgment: Judgment normal.      MD Evaluation Airway: WNL Heart: WNL Abdomen: WNL Chest/ Lungs: WNL ASA  Classification: 3 Mallampati/Airway Score: Two   Imaging: Ct Angio Head W Or Wo Contrast  Result Date: 10/15/2019 CLINICAL DATA:  73 year old female with history of symptomatic right MCA stenosis treated endovascularly in January 2020 with stent assisted angioplasty. Prior infarcts in the basal ganglia, right centrum semiovale. EXAM: CT ANGIOGRAPHY HEAD AND NECK TECHNIQUE: Multidetector CT imaging of the head and neck was performed using the standard protocol during bolus administration of intravenous contrast. Multiplanar CT image reconstructions and MIPs were obtained to evaluate the vascular anatomy. Carotid stenosis measurements (when applicable) are obtained utilizing NASCET criteria, using the distal internal carotid diameter as the denominator. CONTRAST:  31mL OMNIPAQUE IOHEXOL 350 MG/ML SOLN COMPARISON:  Cerebral angiogram 01/05/2019, brain MRI and intracranial MRA 01/05/2019, brain MRI 12/24/2018, pretreatment brain MRI and intracranial MRA 12/08/2018. Head CT 12/24/2018. FINDINGS: CT HEAD Brain: Patchy encephalomalacia in the right centrum semi of bowel and basal ganglia. Right MCA M1 stent extending into the posterior division. No midline shift, ventriculomegaly, mass effect, evidence of mass lesion, intracranial hemorrhage or evidence of cortically based acute infarction. Calvarium and skull base: No acute osseous abnormality identified. Paranasal sinuses: Stable since January and generally well  pneumatized. Orbits: No acute orbit or scalp soft tissue findings. CTA NECK Skeleton: No acute osseous abnormality identified. Upper chest: Small calcified precarinal lymph node compatible with prior granulomatous disease. No superior mediastinal lymphadenopathy. Negative visible lungs. Other neck: Negative. Aortic arch: Bulky soft and calcified plaque in the distal arch distal to the left subclavian artery origin on series 13, image 127. 3 vessel arch configuration. Right carotid system: Negative brachiocephalic artery and right CCA origin. Negative right carotid bifurcation. Beaded appearance of the mid cervical right ICA (series 13, image 70). Otherwise negative cervical right ICA. Left carotid system: Minimal soft plaque at the left CCA origin without stenosis. Minimal plaque at the left carotid bifurcation. Mildly tortuous cervical left ICA with a beaded and nodular appearance without stenosis (series 13, image 113. Vertebral arteries: Calcified plaque at the right subclavian artery origin without stenosis. Soft and calcified plaque adjacent to the right vertebral artery origin, mild right V1 segment stenosis. Tortuous right V1 segment with a kinked appearance on series 11, image 264. Otherwise widely patent right vertebral artery to the skull base. Soft and calcified plaque at the left subclavian artery origin in conjunction with that in the distal arch but no significant stenosis. Moderate to severe focal irregularity and stenosis of the left vertebral V1 segment just distal to the origin on series 12, image 158. Beyond this level the left Vertebral caliber is codominant. Additional moderate stenosis in the left V2 segment at the C4-C5 level (series 11, image 210). The left vertebral remains patent to the skull base. CTA HEAD Posterior circulation: Negative V4 vertebral artery segments with normal vertebrobasilar junction. Normal right PICA origin. The left AICA may be dominant. Patent basilar artery without  stenosis. Normal SCA and PCA origins. Posterior communicating arteries are diminutive or absent. There is mild irregularity in the left PCA P1/P2 junction on series 14, image 20. Otherwise negative PCA branches. Anterior circulation: Patent ICA siphons. On the left there is mild to moderate calcified plaque mostly in the supraclinoid segment but only mild stenosis. On the right mild calcified plaque without stenosis. The ACA origins appear patent and normal. Anterior communicating artery and bilateral ACA branches are within normal limits. Left MCA origin is mildly irregular without stenosis. See series 16, image 21. Left M1 and bifurcation are patent without stenosis. Left MCA branches are within normal limits. Right MCA stent from the proximal M1 segment into the posterior right MCA M2 is patent but there is asymmetrically decreased enhancement at the right MCA origin on series 16, image 20. Similar substantially asymmetrically decreased enhancement of right MCA branches although no discrete branch occlusion is identified. See series 15, image 12, series 14, image 18. Venous sinuses: Patent. Anatomic variants: None. Review of the MIP images confirms the above findings IMPRESSION: 1. Patent Right MCA stent but substantially decreased Right MCA origin and MCA branch enhancement compared to the left side. No discrete branch occlusion. 2. Evidence of bilateral cervical ICA Fibromuscular Dysplasia (FMD). Mild carotid atherosclerosis without significant stenosis. 3. 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 Right V1 atherosclerosis with only mild stenosis. Negative distal vertebral arteries and basilar. Mild left PCA irregularity and stenosis. 4. Conspicuous soft and calcified plaque in the distal aortic arch. Some involvement of the left subclavian artery origin without stenosis. 5. No acute intracranial abnormality. Chronic ischemia in the right MCA territory.  Electronically Signed   By: Genevie Ann M.D.   On: 10/15/2019 10:32   Ct Angio Neck W Or Wo Contrast  Result Date: 10/15/2019 CLINICAL DATA:  73 year old female with history of symptomatic right MCA stenosis treated endovascularly in January 2020 with stent assisted angioplasty. Prior infarcts in the basal ganglia, right centrum semiovale. EXAM: CT ANGIOGRAPHY HEAD AND NECK TECHNIQUE: Multidetector CT imaging of the head and neck was performed using the standard protocol during bolus administration of intravenous contrast. Multiplanar CT image reconstructions and MIPs were obtained to evaluate the vascular anatomy. Carotid stenosis measurements (when applicable) are obtained utilizing NASCET criteria, using the distal internal carotid diameter as the denominator. CONTRAST:  11mL OMNIPAQUE IOHEXOL 350 MG/ML SOLN COMPARISON:  Cerebral angiogram 01/05/2019, brain MRI and intracranial MRA 01/05/2019, brain MRI 12/24/2018, pretreatment brain MRI and intracranial MRA 12/08/2018. Head CT 12/24/2018. FINDINGS: CT HEAD Brain: Patchy encephalomalacia in the right centrum semi of bowel and basal ganglia. Right MCA M1 stent extending into the posterior division. No midline shift, ventriculomegaly, mass effect, evidence of mass lesion, intracranial hemorrhage or evidence of cortically based acute infarction. Calvarium and skull base: No acute osseous abnormality identified. Paranasal sinuses: Stable since January and generally well pneumatized. Orbits: No acute orbit or scalp soft tissue findings. CTA NECK Skeleton: No acute osseous abnormality identified. Upper chest: Small calcified precarinal lymph node compatible with prior granulomatous disease. No superior mediastinal lymphadenopathy. Negative visible lungs. Other neck: Negative. Aortic arch: Bulky soft and calcified plaque in the distal arch distal to the left subclavian artery origin on series 13, image 127. 3 vessel arch configuration. Right carotid system: Negative  brachiocephalic artery and right CCA origin. Negative right carotid bifurcation. Beaded appearance of the mid cervical right ICA (series 13, image 70). Otherwise negative cervical right ICA. Left  carotid system: Minimal soft plaque at the left CCA origin without stenosis. Minimal plaque at the left carotid bifurcation. Mildly tortuous cervical left ICA with a beaded and nodular appearance without stenosis (series 13, image 113. Vertebral arteries: Calcified plaque at the right subclavian artery origin without stenosis. Soft and calcified plaque adjacent to the right vertebral artery origin, mild right V1 segment stenosis. Tortuous right V1 segment with a kinked appearance on series 11, image 264. Otherwise widely patent right vertebral artery to the skull base. Soft and calcified plaque at the left subclavian artery origin in conjunction with that in the distal arch but no significant stenosis. Moderate to severe focal irregularity and stenosis of the left vertebral V1 segment just distal to the origin on series 12, image 158. Beyond this level the left Vertebral caliber is codominant. Additional moderate stenosis in the left V2 segment at the C4-C5 level (series 11, image 210). The left vertebral remains patent to the skull base. CTA HEAD Posterior circulation: Negative V4 vertebral artery segments with normal vertebrobasilar junction. Normal right PICA origin. The left AICA may be dominant. Patent basilar artery without stenosis. Normal SCA and PCA origins. Posterior communicating arteries are diminutive or absent. There is mild irregularity in the left PCA P1/P2 junction on series 14, image 20. Otherwise negative PCA branches. Anterior circulation: Patent ICA siphons. On the left there is mild to moderate calcified plaque mostly in the supraclinoid segment but only mild stenosis. On the right mild calcified plaque without stenosis. The ACA origins appear patent and normal. Anterior communicating artery and  bilateral ACA branches are within normal limits. Left MCA origin is mildly irregular without stenosis. See series 16, image 21. Left M1 and bifurcation are patent without stenosis. Left MCA branches are within normal limits. Right MCA stent from the proximal M1 segment into the posterior right MCA M2 is patent but there is asymmetrically decreased enhancement at the right MCA origin on series 16, image 20. Similar substantially asymmetrically decreased enhancement of right MCA branches although no discrete branch occlusion is identified. See series 15, image 12, series 14, image 18. Venous sinuses: Patent. Anatomic variants: None. Review of the MIP images confirms the above findings IMPRESSION: 1. Patent Right MCA stent but substantially decreased Right MCA origin and MCA branch enhancement compared to the left side. No discrete branch occlusion. 2. Evidence of bilateral cervical ICA Fibromuscular Dysplasia (FMD). Mild carotid atherosclerosis without significant stenosis. 3. 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 Right V1 atherosclerosis with only mild stenosis. Negative distal vertebral arteries and basilar. Mild left PCA irregularity and stenosis. 4. Conspicuous soft and calcified plaque in the distal aortic arch. Some involvement of the left subclavian artery origin without stenosis. 5. No acute intracranial abnormality. Chronic ischemia in the right MCA territory. Electronically Signed   By: Genevie Ann M.D.   On: 10/15/2019 10:32    Labs:  CBC: Recent Labs    01/04/19 0619 01/04/19 1136 01/05/19 0457 09/30/19 1833 10/30/19 0631  WBC 4.7  --  8.6 4.0 4.4  HGB 13.9 8.5* 11.9* 13.2 14.6  HCT 42.2 25.0* 35.1* 39.5 43.6  PLT 288  --  279 185 199    COAGS: Recent Labs    12/24/18 0750 01/04/19 0619 10/30/19 0631  INR 1.07 0.96 1.0  APTT 22*  --   --     BMP: Recent Labs    01/04/19 [id number] 01/04/19 1136 01/05/19 0457 09/30/19 1833  10/30/19 0631  NA 137 141 139 140 142  K 3.7 3.5 3.7 4.1 4.2  CL 104  --  112* 107 104  CO2 23  --  20* 23 26  GLUCOSE 97  --  121* 107* 93  BUN 14  --  7* 18 19  CALCIUM 9.7  --  8.9 9.5 9.8  CREATININE 0.77  --  0.66 0.79 0.71  GFRNONAA >60  --  >60 >60 >60  GFRAA >60  --  >60 >60 >60    LIVER FUNCTION TESTS: Recent Labs    12/08/18 0929 12/24/18 0750  BILITOT 0.5 0.9  AST 24 26  ALT 21 21  ALKPHOS 73 63  PROT 7.1 7.0  ALBUMIN 4.3 4.1    TUMOR MARKERS: No results for input(s): AFPTM, CEA, CA199, CHROMGRNA in the last 8760 hours.  Assessment and Plan:  73 y/o F with history of revascularization of the right MCA M1 segment with stent assisted angioplasty with resulting 80% patency on 01/04/19 with Dr. 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 She presents today for a diagnostic cerebral angiogram to further evaluate the most recent CTA findings.  Patient has been NPO since 9 pm last night except for a few sips of water with her morning medications, she continues on Brilinta BID + ASA QD. Afebrile, WBC 4.4, hgb 14.6, plt 199, creatinine 0.71, INR 1.0.  Risks and benefits of diagnostic cerebral angiogram were discussed with the patient including, but not limited to bleeding, infection, vascular injury, stroke, or contrast induced renal failure. This interventional procedure involves the use of X-rays and because of the nature of the planned procedure, it is possible that we will have prolonged use of X-ray fluoroscopy.  Potential radiation risks to you include (but are not limited to) the following: -  A slightly elevated risk for cancer  several years later in life. This risk is typically less than 0.5% percent. This risk is low in comparison to the normal incidence of human cancer, which is 33% for women and 50% for men according to the [employer]. - Radiation induced injury can include skin redness, resembling a rash, tissue breakdown / ulcers and hair loss (which can be temporary or permanent).  The likelihood of either of these occurring depends on the difficulty of the procedure and whether you are sensitive to radiation due to previous procedures, disease, or genetic conditions.  IF your procedure requires a prolonged use of radiation, you will be notified and given written instructions for further action.  It is your responsibility to monitor the irradiated area for the 2 weeks following the procedure and to notify your physician if you are concerned that you have suffered a radiation induced injury.    All of the patient's questions were answered, patient is agreeable to proceed.  Consent signed and in chart.  Thank you for this interesting consult.  I greatly enjoyed meeting Mariyam Galeski and look forward to participating in their care.  A copy of this report was sent to the requesting provider on this date.  Electronically Signed: Joaquim Nam, PA-C 10/30/2019, 7:48 AM   I spent a total of15 Minutes in face to face  in clinical consultation, greater than 50% of which was counseling/coordinating care for diagnostic cerebral angiogram.

## 2019-10-30 NOTE — Progress Notes (Signed)
Ambulated in hallway and to bathroom to void. Tol well

## 2019-11-02 ENCOUNTER — Encounter (HOSPITAL_COMMUNITY): Payer: Self-pay | Admitting: Interventional Radiology

## 2019-11-02 HISTORY — PX: IR US GUIDE VASC ACCESS RIGHT: IMG2390

## 2019-11-06 ENCOUNTER — Other Ambulatory Visit (HOSPITAL_COMMUNITY): Payer: Self-pay | Admitting: Interventional Radiology

## 2019-11-06 DIAGNOSIS — I771 Stricture of artery: Secondary | ICD-10-CM

## 2019-11-15 ENCOUNTER — Encounter: Payer: Self-pay | Admitting: Cardiology

## 2019-11-16 ENCOUNTER — Other Ambulatory Visit: Payer: Self-pay | Admitting: Student

## 2019-11-16 ENCOUNTER — Telehealth: Payer: Self-pay | Admitting: Student

## 2019-11-16 LAB — PLATELET INHIBITION P2Y12: Platelet Function  P2Y12: 0 [PRU] — ABNORMAL LOW (ref 182–335)

## 2019-11-16 NOTE — Progress Notes (Signed)
NIR.  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 Estanislado Pandy; underwent an image-guided diagnostic cerebral arteriogram 10/30/2019 by Dr. 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 Patient is scheduled for an image-guided cerebral arteriogram with possible revascularization of right MCA M1 segment intra-stent stenosis tentatively for 11/21/2019 with Dr. Estanislado Pandy.  Pre-procedure P2Y12 this AM- 0 PRU. Discussed case with Dr. Estanislado Pandy who recommends discontinuing Brilinta 90 mg twice daily, begin taking Brilinta 45 mg twice daily, continue taking Aspirin 81 mg once daily, and come to [facility] Monday 11/19/2019 for repeat P2Y12. Called patient's home number at [phone], no answer. Called patient's cell number at [phone] and spoke with patient. Informed patient of above. All questions answered and concerns addressed. Patient conveys understanding and agrees with plan.   Bea Graff Gillian Kluever, PA-C 11/16/2019, 11:33 AM

## 2019-11-17 ENCOUNTER — Other Ambulatory Visit (HOSPITAL_COMMUNITY)
Admission: RE | Admit: 2019-11-17 | Discharge: 2019-11-17 | Disposition: A | Discharge status: Home / Self Care | Payer: Medicare Other | Source: Ambulatory Visit | Attending: Interventional Radiology | Admitting: Interventional Radiology

## 2019-11-17 DIAGNOSIS — Z01812 Encounter for preprocedural laboratory examination: Secondary | ICD-10-CM | POA: Insufficient documentation

## 2019-11-17 DIAGNOSIS — Z20828 Contact with and (suspected) exposure to other viral communicable diseases: Secondary | ICD-10-CM | POA: Insufficient documentation

## 2019-11-19 ENCOUNTER — Other Ambulatory Visit (HOSPITAL_COMMUNITY): Payer: Self-pay

## 2019-11-19 DIAGNOSIS — I771 Stricture of artery: Secondary | ICD-10-CM

## 2019-11-19 DIAGNOSIS — I729 Aneurysm of unspecified site: Secondary | ICD-10-CM

## 2019-11-19 LAB — PLATELET INHIBITION P2Y12: Platelet Function  P2Y12: 8 [PRU] — ABNORMAL LOW (ref 182–335)

## 2019-11-19 LAB — NOVEL CORONAVIRUS, NAA (HOSP ORDER, SEND-OUT TO REF LAB; TAT 18-24 HRS): SARS-CoV-2, NAA: NOT DETECTED

## 2019-11-20 ENCOUNTER — Encounter (HOSPITAL_COMMUNITY): Payer: Self-pay | Admitting: *Deleted

## 2019-11-20 ENCOUNTER — Other Ambulatory Visit: Payer: Self-pay | Admitting: Student

## 2019-11-20 ENCOUNTER — Other Ambulatory Visit: Payer: Self-pay

## 2019-11-20 NOTE — Anesthesia Preprocedure Evaluation (Addendum)
Anesthesia Evaluation  Patient identified by MRN, date of birth, ID band Patient awake    Reviewed: Allergy & Precautions, NPO status , Patient's Chart, lab work & pertinent test results  History of Anesthesia Complications (+) PROLONGED EMERGENCE and history of anesthetic complications  Airway Mallampati: II  TM Distance: >3 FB Neck ROM: Full    Dental  (+) Dental Advisory Given, Teeth Intact   Pulmonary neg pulmonary ROS,    Pulmonary exam normal        Cardiovascular hypertension, Pt. on medications Normal cardiovascular exam   '19 TTE - Normal EF, no valvulopathy  '19 Carotid US - <50% ICAS bilaterally    Neuro/Psych  Headaches, PSYCHIATRIC DISORDERS Anxiety  Cerebral artery stenosis  CVA, Residual Symptoms    GI/Hepatic Neg liver ROS, GERD  Medicated and Controlled,  Endo/Other  Hypothyroidism   Renal/GU negative Renal ROS     Musculoskeletal  (+) Arthritis ,   Abdominal   Peds  Hematology  Last dose of ticagrelor this AM    Anesthesia Other Findings Covid negative 12/5   Reproductive/Obstetrics                            Anesthesia Physical Anesthesia Plan  ASA: III  Anesthesia Plan: General   Post-op Pain Management:    Induction: Intravenous  PONV Risk Score and Plan: 3 and Treatment may vary due to age or medical condition, Ondansetron and Dexamethasone  Airway Management Planned: Oral ETT  Additional Equipment: None and Arterial line  Intra-op Plan:   Post-operative Plan: Extubation in OR  Informed Consent: I have reviewed the patients History and Physical, chart, labs and discussed the procedure including the risks, benefits and alternatives for the proposed anesthesia with the patient or authorized representative who has indicated his/her understanding and acceptance.     Dental advisory given  Plan Discussed with: CRNA and  Anesthesiologist  Anesthesia Plan Comments: (May begin as MAC if requested by proceduralist, transition to GETA as indicated by procedure)       Anesthesia Quick Evaluation

## 2019-11-20 NOTE — Progress Notes (Addendum)
Spoke with pt for pre-op call. Pt denies cardiac history, chest pain or sob. Pt states she is not diabetic. Pt states she taking her Aspirin and Brilinta. Pt sees a cardiologist for HTN - Dr. Darnell Level Usher in [place], [place]  Pt had Covid test done on 11/17/19 and it's negative. Pt states she has been quarantine since the test was done and understands that she needs to stay in quarantine until she comes to the hospital.  Pt informed of the Visitation policy and voiced understanding.

## 2019-11-21 ENCOUNTER — Encounter (HOSPITAL_COMMUNITY): Payer: Self-pay

## 2019-11-21 ENCOUNTER — Ambulatory Visit (HOSPITAL_COMMUNITY)
Admission: RE | Admit: 2019-11-21 | Discharge: 2019-11-21 | Disposition: A | Discharge status: Home / Self Care | Payer: Medicare Other | Source: Ambulatory Visit | Attending: Interventional Radiology | Admitting: Interventional Radiology

## 2019-11-21 ENCOUNTER — Ambulatory Visit (HOSPITAL_COMMUNITY): Payer: Medicare Other | Admitting: Anesthesiology

## 2019-11-21 ENCOUNTER — Inpatient Hospital Stay (HOSPITAL_COMMUNITY)
Admission: RE | Admit: 2019-11-21 | Discharge: 2019-11-22 | DRG: 026 | Disposition: A | Discharge status: Home / Self Care | Payer: Medicare Other | Attending: Interventional Radiology | Admitting: Interventional Radiology

## 2019-11-21 ENCOUNTER — Encounter (HOSPITAL_COMMUNITY)
Admission: RE | Disposition: A | Discharge status: Home / Self Care | Payer: Self-pay | Source: Home / Self Care | Attending: Interventional Radiology

## 2019-11-21 DIAGNOSIS — G43909 Migraine, unspecified, not intractable, without status migrainosus: Secondary | ICD-10-CM | POA: Diagnosis present

## 2019-11-21 DIAGNOSIS — Z882 Allergy status to sulfonamides status: Secondary | ICD-10-CM | POA: Diagnosis not present

## 2019-11-21 DIAGNOSIS — E039 Hypothyroidism, unspecified: Secondary | ICD-10-CM | POA: Diagnosis present

## 2019-11-21 DIAGNOSIS — Z8673 Personal history of transient ischemic attack (TIA), and cerebral infarction without residual deficits: Secondary | ICD-10-CM

## 2019-11-21 DIAGNOSIS — Z7982 Long term (current) use of aspirin: Secondary | ICD-10-CM

## 2019-11-21 DIAGNOSIS — I959 Hypotension, unspecified: Secondary | ICD-10-CM | POA: Diagnosis present

## 2019-11-21 DIAGNOSIS — I10 Essential (primary) hypertension: Secondary | ICD-10-CM | POA: Diagnosis present

## 2019-11-21 DIAGNOSIS — Z823 Family history of stroke: Secondary | ICD-10-CM

## 2019-11-21 DIAGNOSIS — T82856A Stenosis of peripheral vascular stent, initial encounter: Secondary | ICD-10-CM | POA: Diagnosis present

## 2019-11-21 DIAGNOSIS — M1990 Unspecified osteoarthritis, unspecified site: Secondary | ICD-10-CM | POA: Diagnosis present

## 2019-11-21 DIAGNOSIS — Z20828 Contact with and (suspected) exposure to other viral communicable diseases: Secondary | ICD-10-CM | POA: Diagnosis present

## 2019-11-21 DIAGNOSIS — F419 Anxiety disorder, unspecified: Secondary | ICD-10-CM | POA: Diagnosis present

## 2019-11-21 DIAGNOSIS — I6609 Occlusion and stenosis of unspecified middle cerebral artery: Secondary | ICD-10-CM | POA: Diagnosis present

## 2019-11-21 DIAGNOSIS — I6601 Occlusion and stenosis of right middle cerebral artery: Principal | ICD-10-CM | POA: Diagnosis present

## 2019-11-21 DIAGNOSIS — Z23 Encounter for immunization: Secondary | ICD-10-CM | POA: Diagnosis not present

## 2019-11-21 DIAGNOSIS — Z888 Allergy status to other drugs, medicaments and biological substances status: Secondary | ICD-10-CM

## 2019-11-21 DIAGNOSIS — I771 Stricture of artery: Secondary | ICD-10-CM

## 2019-11-21 DIAGNOSIS — E785 Hyperlipidemia, unspecified: Secondary | ICD-10-CM | POA: Diagnosis present

## 2019-11-21 DIAGNOSIS — Z79899 Other long term (current) drug therapy: Secondary | ICD-10-CM | POA: Diagnosis not present

## 2019-11-21 DIAGNOSIS — Z79890 Hormone replacement therapy (postmenopausal): Secondary | ICD-10-CM

## 2019-11-21 DIAGNOSIS — K219 Gastro-esophageal reflux disease without esophagitis: Secondary | ICD-10-CM | POA: Diagnosis present

## 2019-11-21 DIAGNOSIS — E079 Disorder of thyroid, unspecified: Secondary | ICD-10-CM | POA: Diagnosis present

## 2019-11-21 DIAGNOSIS — Z8249 Family history of ischemic heart disease and other diseases of the circulatory system: Secondary | ICD-10-CM | POA: Diagnosis not present

## 2019-11-21 DIAGNOSIS — R29810 Facial weakness: Secondary | ICD-10-CM | POA: Diagnosis present

## 2019-11-21 HISTORY — DX: Hypothyroidism, unspecified: E03.9

## 2019-11-21 HISTORY — DX: Unspecified osteoarthritis, unspecified site: M19.90

## 2019-11-21 HISTORY — PX: RADIOLOGY WITH ANESTHESIA: SHX6223

## 2019-11-21 HISTORY — PX: IR INTRA CRAN STENT: IMG2345

## 2019-11-21 HISTORY — DX: Dyspnea, unspecified: R06.00

## 2019-11-21 HISTORY — DX: Anxiety disorder, unspecified: F41.9

## 2019-11-21 HISTORY — DX: Headache, unspecified: R51.9

## 2019-11-21 HISTORY — PX: IR CT HEAD LTD: IMG2386

## 2019-11-21 LAB — BASIC METABOLIC PANEL
Anion gap: 11 (ref 5–15)
BUN: 15 mg/dL (ref 8–23)
CO2: 25 mmol/L (ref 22–32)
Calcium: 9.9 mg/dL (ref 8.9–10.3)
Chloride: 105 mmol/L (ref 98–111)
Creatinine, Ser: 0.74 mg/dL (ref 0.44–1.00)
GFR calc Af Amer: >60 mL/min (ref >60)
GFR calc non Af Amer: >60 mL/min (ref >60)
Glucose, Bld: 89 mg/dL (ref 70–99)
Potassium: 4 mmol/L (ref 3.5–5.1)
Sodium: 141 mmol/L (ref 135–145)

## 2019-11-21 LAB — URINALYSIS, COMPLETE (UACMP) WITH MICROSCOPIC
Bilirubin Urine: NEGATIVE
Glucose, UA: NEGATIVE mg/dL
Hgb urine dipstick: NEGATIVE
Ketones, ur: NEGATIVE mg/dL
Leukocytes,Ua: NEGATIVE
Nitrite: NEGATIVE
Protein, ur: NEGATIVE mg/dL
Specific Gravity, Urine: 1.013 (ref 1.005–1.030)
pH: 5 (ref 5.0–8.0)

## 2019-11-21 LAB — CBC WITH DIFFERENTIAL/PLATELET
Abs Immature Granulocytes: 0.01 10*3/uL (ref 0.00–0.07)
Basophils Absolute: 0.1 10*3/uL (ref 0.0–0.1)
Basophils Relative: 1 %
Eosinophils Absolute: 0.1 10*3/uL (ref 0.0–0.5)
Eosinophils Relative: 2 %
HCT: 39.9 % (ref 36.0–46.0)
Hemoglobin: 13.3 g/dL (ref 12.0–15.0)
Immature Granulocytes: 0 %
Lymphocytes Relative: 35 %
Lymphs Abs: 1.3 10*3/uL (ref 0.7–4.0)
MCH: 31.1 pg (ref 26.0–34.0)
MCHC: 33.3 g/dL (ref 30.0–36.0)
MCV: 93.2 fL (ref 80.0–100.0)
Monocytes Absolute: 0.3 10*3/uL (ref 0.1–1.0)
Monocytes Relative: 8 %
Neutro Abs: 2 10*3/uL (ref 1.7–7.7)
Neutrophils Relative %: 54 %
Platelets: 187 10*3/uL (ref 150–400)
RBC: 4.28 MIL/uL (ref 3.87–5.11)
RDW: 11.5 % (ref 11.5–15.5)
WBC: 3.7 10*3/uL — ABNORMAL LOW (ref 4.0–10.5)
nRBC: 0 % (ref 0.0–0.2)

## 2019-11-21 LAB — APTT
aPTT: 30 seconds (ref 24–36)
aPTT: >200 seconds (ref 24–36)

## 2019-11-21 LAB — PLATELET INHIBITION P2Y12: Platelet Function  P2Y12: 8 [PRU] — ABNORMAL LOW (ref 182–335)

## 2019-11-21 LAB — PROTIME-INR
INR: 0.9 (ref 0.8–1.2)
Prothrombin Time: 12.5 seconds (ref 11.4–15.2)

## 2019-11-21 LAB — POCT ACTIVATED CLOTTING TIME
Activated Clotting Time: 257 seconds
Activated Clotting Time: 274 seconds
Activated Clotting Time: 313 seconds
Activated Clotting Time: 230 seconds
Activated Clotting Time: 235 seconds
Activated Clotting Time: 230 seconds
Activated Clotting Time: 235 seconds
Activated Clotting Time: 186 seconds

## 2019-11-21 LAB — MRSA PCR SCREENING: MRSA by PCR: NEGATIVE

## 2019-11-21 SURGERY — IR WITH ANESTHESIA
Anesthesia: General

## 2019-11-21 MED ORDER — PHENYLEPHRINE HCL-NACL 10-0.9 MG/250ML-% IV SOLN
INTRAVENOUS | Status: DC | PRN
  Administered 2019-11-21: 13:00:00 via INTRAVENOUS
  Administered 2019-11-21: 25 ug/min via INTRAVENOUS

## 2019-11-21 MED ORDER — AMLODIPINE BESYLATE 2.5 MG PO TABS
2.5000 mg | ORAL_TABLET | Freq: Every day | ORAL | Status: DC
  Administered 2019-11-22: 2.5 mg via ORAL
  Filled 2019-11-21: qty 1

## 2019-11-21 MED ORDER — IOHEXOL 300 MG/ML  SOLN
150.0000 mL | Freq: Once | INTRAMUSCULAR | Status: AC | PRN
  Administered 2019-11-21: 15 mL via INTRA_ARTERIAL

## 2019-11-21 MED ORDER — POLYVINYL ALCOHOL 1.4 % OP SOLN
1.0000 [drp] | Freq: Every day | OPHTHALMIC | Status: DC | PRN
  Filled 2019-11-21: qty 15

## 2019-11-21 MED ORDER — ONDANSETRON HCL 4 MG/2ML IJ SOLN
INTRAMUSCULAR | Status: DC | PRN
  Administered 2019-11-21: 4 mg via INTRAVENOUS

## 2019-11-21 MED ORDER — CLEVIDIPINE BUTYRATE 0.5 MG/ML IV EMUL
0.0000 mg/h | INTRAVENOUS | Status: DC
  Administered 2019-11-21: 6 mg/h via INTRAVENOUS

## 2019-11-21 MED ORDER — OXYCODONE HCL 5 MG PO TABS: 5.0000 mg | ORAL_TABLET | Freq: Once | ORAL | Status: DC | PRN

## 2019-11-21 MED ORDER — NITROGLYCERIN 1 MG/10 ML FOR IR/CATH LAB
INTRA_ARTERIAL | Status: AC
  Filled 2019-11-21: qty 10

## 2019-11-21 MED ORDER — HEPARIN (PORCINE) 25000 UT/250ML-% IV SOLN: 500.0000 [IU]/h | INTRAVENOUS | Status: DC

## 2019-11-21 MED ORDER — OXYCODONE HCL 5 MG/5ML PO SOLN: 5.0000 mg | Freq: Once | ORAL | Status: DC | PRN

## 2019-11-21 MED ORDER — FENTANYL CITRATE (PF) 100 MCG/2ML IJ SOLN: 25.0000 ug | INTRAMUSCULAR | Status: DC | PRN

## 2019-11-21 MED ORDER — LIDOCAINE 2% (20 MG/ML) 5 ML SYRINGE
INTRAMUSCULAR | Status: DC | PRN
  Administered 2019-11-21: 60 mg via INTRAVENOUS

## 2019-11-21 MED ORDER — TICAGRELOR 90 MG PO TABS
90.0000 mg | ORAL_TABLET | Freq: Once | ORAL | Status: AC
  Administered 2019-11-21: 90 mg via ORAL
  Filled 2019-11-21: qty 1

## 2019-11-21 MED ORDER — ASPIRIN 81 MG PO CHEW
81.0000 mg | CHEWABLE_TABLET | Freq: Every day | ORAL | Status: DC
  Administered 2019-11-22: 81 mg via ORAL
  Filled 2019-11-21: qty 1

## 2019-11-21 MED ORDER — NITROGLYCERIN 1 MG/10 ML FOR IR/CATH LAB
INTRA_ARTERIAL | Status: AC | PRN
  Administered 2019-11-21 (×2): 25 ug via INTRA_ARTERIAL

## 2019-11-21 MED ORDER — BUSPIRONE HCL 5 MG PO TABS
5.0000 mg | ORAL_TABLET | Freq: Two times a day (BID) | ORAL | Status: DC | PRN
  Filled 2019-11-21: qty 1

## 2019-11-21 MED ORDER — LEVOTHYROXINE SODIUM 50 MCG PO TABS
50.0000 ug | ORAL_TABLET | ORAL | Status: DC
  Filled 2019-11-21: qty 1

## 2019-11-21 MED ORDER — LIDOCAINE HCL 1 % IJ SOLN
INTRAMUSCULAR | Status: AC
  Filled 2019-11-21: qty 20

## 2019-11-21 MED ORDER — LORATADINE 10 MG PO TABS
10.0000 mg | ORAL_TABLET | Freq: Every day | ORAL | Status: DC
  Administered 2019-11-22: 10 mg via ORAL
  Filled 2019-11-21: qty 1

## 2019-11-21 MED ORDER — IOHEXOL 300 MG/ML  SOLN
150.0000 mL | Freq: Once | INTRAMUSCULAR | Status: AC | PRN
  Administered 2019-11-21: 75 mL via INTRA_ARTERIAL

## 2019-11-21 MED ORDER — LISINOPRIL 10 MG PO TABS
10.0000 mg | ORAL_TABLET | Freq: Every day | ORAL | Status: DC
  Administered 2019-11-22: 10 mg via ORAL
  Filled 2019-11-21: qty 1

## 2019-11-21 MED ORDER — ONDANSETRON HCL 4 MG/2ML IJ SOLN: 4.0000 mg | Freq: Once | INTRAMUSCULAR | Status: DC | PRN

## 2019-11-21 MED ORDER — SODIUM CHLORIDE 0.9 % IV SOLN
INTRAVENOUS | Status: DC
  Administered 2019-11-21 – 2019-11-22 (×2): via INTRAVENOUS

## 2019-11-21 MED ORDER — CLEVIDIPINE BUTYRATE 0.5 MG/ML IV EMUL
INTRAVENOUS | Status: AC
  Filled 2019-11-21: qty 50

## 2019-11-21 MED ORDER — ACETAMINOPHEN 325 MG PO TABS
650.0000 mg | ORAL_TABLET | Freq: Four times a day (QID) | ORAL | Status: DC | PRN
  Administered 2019-11-21 – 2019-11-22 (×2): 650 mg via ORAL
  Filled 2019-11-21 (×2): qty 2

## 2019-11-21 MED ORDER — VANCOMYCIN HCL IN DEXTROSE 1-5 GM/200ML-% IV SOLN
1000.0000 mg | Freq: Once | INTRAVENOUS | Status: AC
  Administered 2019-11-21: 1000 mg via INTRAVENOUS
  Filled 2019-11-21: qty 200

## 2019-11-21 MED ORDER — ASPIRIN 81 MG PO CHEW: 81.0000 mg | CHEWABLE_TABLET | Freq: Every day | ORAL | Status: DC

## 2019-11-21 MED ORDER — FENTANYL CITRATE (PF) 100 MCG/2ML IJ SOLN
INTRAMUSCULAR | Status: DC | PRN
  Administered 2019-11-21 (×2): 50 ug via INTRAVENOUS

## 2019-11-21 MED ORDER — VANCOMYCIN HCL 1000 MG IV SOLR: 1000.0000 mg | Freq: Once | INTRAVENOUS | Status: DC

## 2019-11-21 MED ORDER — ASPIRIN EC 81 MG PO TBEC: 81.0000 mg | DELAYED_RELEASE_TABLET | Freq: Once | ORAL | Status: DC

## 2019-11-21 MED ORDER — LIDOCAINE HCL 1 % IJ SOLN
INTRAMUSCULAR | Status: AC | PRN
  Administered 2019-11-21: 10 mL
  Administered 2019-11-21: 15 mL

## 2019-11-21 MED ORDER — HEPARIN (PORCINE) 25000 UT/250ML-% IV SOLN
INTRAVENOUS | Status: AC
  Filled 2019-11-21: qty 250

## 2019-11-21 MED ORDER — EPHEDRINE SULFATE-NACL 50-0.9 MG/10ML-% IV SOSY
PREFILLED_SYRINGE | INTRAVENOUS | Status: DC | PRN
  Administered 2019-11-21 (×2): 5 mg via INTRAVENOUS

## 2019-11-21 MED ORDER — CLEVIDIPINE BUTYRATE 0.5 MG/ML IV EMUL
0.0000 mg/h | INTRAVENOUS | Status: DC
  Administered 2019-11-21: 8 mg/h via INTRAVENOUS
  Administered 2019-11-22: 3 mg/h via INTRAVENOUS
  Administered 2019-11-22: 5 mg/h via INTRAVENOUS
  Filled 2019-11-21 (×3): qty 50

## 2019-11-21 MED ORDER — SUGAMMADEX SODIUM 200 MG/2ML IV SOLN
INTRAVENOUS | Status: DC | PRN
  Administered 2019-11-21: 106.2 mg via INTRAVENOUS

## 2019-11-21 MED ORDER — TICAGRELOR 90 MG PO TABS
45.0000 mg | ORAL_TABLET | Freq: Two times a day (BID) | ORAL | Status: DC
  Administered 2019-11-21 – 2019-11-22 (×2): 45 mg via ORAL
  Filled 2019-11-21 (×2): qty 1

## 2019-11-21 MED ORDER — SODIUM CHLORIDE 0.9 % IV SOLN
INTRAVENOUS | Status: DC
  Administered 2019-11-21 (×3): via INTRAVENOUS

## 2019-11-21 MED ORDER — CHLORHEXIDINE GLUCONATE CLOTH 2 % EX PADS
6.0000 | MEDICATED_PAD | Freq: Every day | CUTANEOUS | Status: DC
  Administered 2019-11-21: 6 via TOPICAL

## 2019-11-21 MED ORDER — CLEVIDIPINE BUTYRATE 0.5 MG/ML IV EMUL
INTRAVENOUS | Status: DC | PRN
  Administered 2019-11-21: 1 mg/h via INTRAVENOUS

## 2019-11-21 MED ORDER — NIMODIPINE 30 MG PO CAPS
0.0000 mg | ORAL_CAPSULE | ORAL | Status: AC
  Administered 2019-11-21: 60 mg via ORAL
  Filled 2019-11-21: qty 2

## 2019-11-21 MED ORDER — TICAGRELOR 90 MG PO TABS: 90.0000 mg | ORAL_TABLET | Freq: Two times a day (BID) | ORAL | Status: DC

## 2019-11-21 MED ORDER — HEPARIN SODIUM (PORCINE) 1000 UNIT/ML IJ SOLN
INTRAMUSCULAR | Status: DC | PRN
  Administered 2019-11-21: 2000 [IU] via INTRAVENOUS
  Administered 2019-11-21: 1000 [IU] via INTRAVENOUS

## 2019-11-21 MED ORDER — PROTAMINE SULFATE 10 MG/ML IV SOLN
INTRAVENOUS | Status: DC | PRN
  Administered 2019-11-21: 5 mg via INTRAVENOUS
  Administered 2019-11-21 (×2): 10 mg via INTRAVENOUS

## 2019-11-21 MED ORDER — ATORVASTATIN CALCIUM 40 MG PO TABS
40.0000 mg | ORAL_TABLET | Freq: Every day | ORAL | Status: DC
  Administered 2019-11-21: 40 mg via ORAL
  Filled 2019-11-21: qty 1

## 2019-11-21 MED ORDER — PROPOFOL 10 MG/ML IV BOLUS
INTRAVENOUS | Status: DC | PRN
  Administered 2019-11-21: 30 mg via INTRAVENOUS
  Administered 2019-11-21: 130 mg via INTRAVENOUS

## 2019-11-21 MED ORDER — HEPARIN (PORCINE) 25000 UT/250ML-% IV SOLN
450.0000 [IU]/h | INTRAVENOUS | Status: DC
  Administered 2019-11-21: 450 [IU]/h via INTRAVENOUS
  Filled 2019-11-21: qty 250

## 2019-11-21 MED ORDER — ROCURONIUM BROMIDE 10 MG/ML (PF) SYRINGE
PREFILLED_SYRINGE | INTRAVENOUS | Status: DC | PRN
  Administered 2019-11-21: 20 mg via INTRAVENOUS
  Administered 2019-11-21: 50 mg via INTRAVENOUS
  Administered 2019-11-21 (×2): 20 mg via INTRAVENOUS

## 2019-11-21 NOTE — Transfer of Care (Signed)
Immediate Anesthesia Transfer of Care Note  Patient: Shannon [name]  Procedure(s) Performed: IR WITH ANESTHESIA/ STENT PLACEMENT (N/A )  Patient Location: PACU  Anesthesia Type:General  Level of Consciousness: drowsy and patient cooperative  Airway & Oxygen Therapy: Patient Spontanous Breathing and Patient connected to face mask oxygen  Post-op Assessment: Report given to RN, Post -op Vital signs reviewed and stable, Patient moving all extremities X 4 and Patient able to stick tongue midline  Post vital signs: Reviewed and stable  Last Vitals:  Vitals Value Taken Time  BP 122/55 11/21/19 1431  Temp    Pulse 85 11/21/19 1433  Resp 23 11/21/19 1433  SpO2 94 % 11/21/19 1433  Vitals shown include unvalidated device data.  Last Pain:  Vitals:   11/21/19 0712  TempSrc:   PainSc: 3       Patients Stated Pain Goal: 3 ([id number])  Complications: No apparent anesthesia complications

## 2019-11-21 NOTE — Sedation Documentation (Signed)
ACT 274, Dr Estanislado Pandy notified

## 2019-11-21 NOTE — Anesthesia Procedure Notes (Signed)
Arterial Line Insertion Start/End12/08/2019 9:45 AM, 11/21/2019 9:47 AM Performed by: Lowella Dell, CRNA, CRNA  Preanesthetic checklist: patient identified, IV checked, site marked, risks and benefits discussed, surgical consent, monitors and equipment checked, pre-op evaluation, timeout performed and anesthesia consent Lidocaine 1% used for infiltration and patient sedated Left, radial was placed Catheter size: 20 G Hand hygiene performed  and maximum sterile barriers used   Attempts: 1 Procedure performed without using ultrasound guided technique. Following insertion, Biopatch and dressing applied. Post procedure assessment: normal  Patient tolerated the procedure well with no immediate complications.

## 2019-11-21 NOTE — Sedation Documentation (Signed)
ACT 230, notified Dr Estanislado Pandy

## 2019-11-21 NOTE — Progress Notes (Signed)
Dr. Estanislado Pandy made aware pt's ACT is 186 and critical PTT is >200. He ordered heparin to start at 4:15pm and PTT goal 50-60.

## 2019-11-21 NOTE — Progress Notes (Signed)
Dr. Estanislado Pandy at bedside and aware of pt's left facial droop present at this time. Pt c/o left groin pain, MD states he will order xray of left hip. Dr. Estanislado Pandy made aware blood was noted on pt's pad, RN and MD assessed pt for possible cause of bleeding. No notable bleeding from right groin, urethra, or rectum. Per MD continue to assess patient for bleeding and start heparin gtt now. Will continue to monitor.

## 2019-11-21 NOTE — Sedation Documentation (Signed)
Stent deployed by Dr Estanislado Pandy

## 2019-11-21 NOTE — H&P (Signed)
Chief Complaint: Patient was seen in consultation today for R MCA stenosis  Supervising Physician: Luanne Bras  Patient Status: [facility] - Out-pt  History of Present Illness: Shannon [name] is a 73 y.o. 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 Estanislado Pandy.  She underwent diagnostic angiogram 10/30/19 which showed high-grade intra-stent stenosis.  After discussing results post-procedure, a plan was made to proceed with repeat angiogram with intent to treat her newly found intra-stent stenosis.   Shannon [name] presents for her procedure today.  She has been NPO.  She reports weakness L > R.  States she occasionally stumbles when walking, but is not dragging her feet.  She overall feels weaker on the left side, but does not have any new symptoms and is overall stable for the past 1 year.   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   Arthritis    Cancer ([place])    skin - squamous cell   Complication of anesthesia    " I had difficulty coming out of it"   Dyspnea    with exertion   Family history of adverse reaction to anesthesia    " My daughter has a problem coming ou to it"   GERD (gastroesophageal reflux disease)    Headache    migraines in the past   Hypertension    Hypothyroidism    Stroke ([place]) 11/2018   some weakness on right side    Thyroid disease     Past Surgical History:  Procedure Laterality Date   ABDOMINAL HYSTERECTOMY     APPENDECTOMY     CARDIAC CATHETERIZATION     CARPAL TUNNEL RELEASE Bilateral    COLONOSCOPY     DILATION AND CURETTAGE OF UTERUS     IR ANGIO INTRA EXTRACRAN SEL COM CAROTID INNOMINATE BILAT MOD SED  12/25/2018   IR ANGIO INTRA EXTRACRAN SEL COM CAROTID INNOMINATE BILAT MOD SED  10/30/2019   IR ANGIO VERTEBRAL SEL SUBCLAVIAN INNOMINATE UNI L MOD SED  12/25/2018   IR ANGIO VERTEBRAL SEL SUBCLAVIAN  INNOMINATE UNI L MOD SED  10/30/2019   IR ANGIO VERTEBRAL SEL SUBCLAVIAN INNOMINATE UNI R MOD SED  01/04/2019   IR ANGIO VERTEBRAL SEL VERTEBRAL UNI R MOD SED  12/25/2018   IR ANGIO VERTEBRAL SEL VERTEBRAL UNI R MOD SED  10/30/2019   IR CT HEAD LTD  01/04/2019   IR INTRA CRAN STENT  01/04/2019   IR US GUIDE VASC ACCESS RIGHT  12/25/2018   IR US GUIDE VASC ACCESS RIGHT  11/02/2019   OVARIAN CYST REMOVAL     RADIOLOGY WITH ANESTHESIA N/A 01/04/2019   Procedure: RMCA angioplasty with possible stenting;  Surgeon: Luanne Bras, MD;  Location: [place];  Service: Radiology;  Laterality: N/A;    Allergies: Sulfa antibiotics, Mandol [cefamandole], and Sinutab non-drowsy max [pseudoephedrine-acetaminophen]  Medications: Prior to Admission medications   Medication Sig Start Date End Date Taking? Authorizing Provider  acetaminophen (TYLENOL) 325 MG tablet Take 650 mg by mouth every 6 (six) hours as needed for headache.   Yes [provider]  Alum Hydroxide-Mag Carbonate (GAVISCON EXTRA STRENGTH) [id number] MG/10ML SUSP Take 5 mLs by mouth daily as needed (acid reflux).   Yes [provider]  amLODipine (NORVASC) 2.5 MG tablet Take 1 tablet (2.5 mg total) by mouth daily. 10/17/19 10/11/20 Yes Buford Dresser, MD  aspirin EC 81 MG  EC tablet Take 1 tablet (81 mg total) by mouth daily. 12/10/18  Yes Mayo, Pete Pelt, MD  atorvastatin (LIPITOR) 40 MG tablet Take 1 tablet (40 mg total) by mouth daily at 6 PM. 12/09/18  Yes Mayo, Pete Pelt, MD  busPIRone (BUSPAR) 5 MG tablet Take 5 mg by mouth 2 (two) times daily as needed (anxiety).  10/12/19  Yes [provider]  Calcium Carb-Cholecalciferol (CALCIUM 600 + D PO) Take 0.5-1 tablets by mouth See admin instructions. Take 1 tablet after breakfast, 1 tablet after lunch, and 0.5 tablet after dinner   Yes [provider]  Carboxymethylcellulose Sodium (ARTIFICIAL TEARS OP) Place 1 drop into both eyes daily as needed  (dry eyes).   Yes [provider]  Coenzyme Q10 50 MG CAPS Take 50 mg by mouth daily.    Yes [provider]  levothyroxine (SYNTHROID, LEVOTHROID) 50 MCG tablet Take 50 mcg by mouth every Monday, Wednesday, and Friday.  04/24/12  Yes [provider]  levothyroxine (SYNTHROID, LEVOTHROID) 75 MCG tablet Take 75 mcg by mouth 4 (four) times a week.  04/24/12  Yes [provider]  lisinopril (PRINIVIL,ZESTRIL) 10 MG tablet Take 10 mg by mouth daily.   Yes [provider]  loratadine (CLARITIN) 10 MG tablet Take 10 mg by mouth daily.   Yes [provider]  Multiple Vitamin (MULTIVITAMIN) capsule Take 1 capsule by mouth daily. Senior 04/24/12  Yes [provider]  ticagrelor (BRILINTA) 90 MG TABS tablet Take 1 tablet (90 mg total) by mouth 2 (two) times daily. 12/27/18  Yes Rehman, Areeg N, DO  vitamin E 400 UNIT capsule Take 400 Units by mouth every other day.    Yes [provider]     Family History  Problem Relation Age of Onset   CVA Mother    Heart disease Mother    Peripheral Artery Disease Father    CVA Maternal Aunt     Social History   Socioeconomic History   Marital status: Married    Spouse name: Not on file   Number of children: Not on file   Years of education: Not on file   Highest education level: Not on file  Occupational History   Occupation: retired  Scientist, product/process development strain: Not on file   Food insecurity    Worry: Not on file    Inability: Not on Lexicographer needs    Medical: Not on file    Non-medical: Not on file  Tobacco Use   Smoking status: Never Smoker   Smokeless tobacco: Never Used  Substance and Sexual Activity   Alcohol use: Never    Frequency: Never   Drug use: Never   Sexual activity: Yes  Lifestyle   Physical activity    Days per week: Not on file    Minutes per session: Not on file   Stress: Not on file  Relationships   Social  connections    Talks on phone: Not on file    Gets together: Not on file    Attends religious service: Not on file    Active member of club or organization: Not on file    Attends meetings of clubs or organizations: Not on file    Relationship status: Not on file  Other Topics Concern   Not on file  Social History Narrative   Not on file     Review of Systems: A 12 point ROS discussed and pertinent positives  are indicated in the HPI above.  All other systems are negative.  Review of Systems  Constitutional: Negative for fatigue and fever.  Respiratory: Negative for cough and shortness of breath.   Cardiovascular: Negative for chest pain.  Gastrointestinal: Negative for abdominal pain.  Musculoskeletal: Negative for back pain.  Neurological: Positive for weakness.  Psychiatric/Behavioral: Negative for behavioral problems and confusion.    Vital Signs: There were no vitals taken for this visit.  Physical Exam Vitals signs and nursing note reviewed.  Constitutional:      General: She is not in acute distress.    Appearance: Normal appearance. She is not ill-appearing.  HENT:     Mouth/Throat:     Mouth: Mucous membranes are moist.     Pharynx: Oropharynx is clear.  Cardiovascular:     Rate and Rhythm: Normal rate and regular rhythm.     Pulses: Normal pulses.  Pulmonary:     Effort: Pulmonary effort is normal. No respiratory distress.     Breath sounds: Normal breath sounds.  Abdominal:     General: Abdomen is flat.     Palpations: Abdomen is soft.  Skin:    General: Skin is warm and dry.  Neurological:     General: No focal deficit present.     Mental Status: She is alert and oriented to person, place, and time. Mental status is at baseline.     Comments: Tongue midline, speech intelligible. Follows commands.  No facial droop.  Point-to-point intact.  Hand grip strength 5/5 on R, 4+/5 on left.  Bilateral lower extremity strength 5/5   Psychiatric:        Mood  and Affect: Mood normal.        Behavior: Behavior normal.        Thought Content: Thought content normal.        Judgment: Judgment normal.          Imaging: Ir US Guide Vasc Access Right  Result Date: 11/02/2019 CLINICAL DATA:  Right middle cerebral artery intra stent stenosis on CT angiogram of the head and neck. Vertebrobasilar insufficiency symptoms.  EXAM: BILATERAL COMMON CAROTID AND INNOMINATE ANGIOGRAPHY, and bilateral vertebral artery angiograms.  COMPARISON:  CT angiogram of the head and neck of October 15, 2019, and also arteriogram of January 04, 2019.  MEDICATIONS: Heparin 2000 units IA; no antibiotic was administered within 1 hour of the procedure.  ANESTHESIA/SEDATION: Versed 1 mg IV; Fentanyl 25 mcg IV  Moderate Sedation Time:  38 minutes  The patient was continuously monitored during the procedure by the interventional radiology nurse under my direct supervision.  CONTRAST:  Isovue 300 approximately 60 mL.  FLUOROSCOPY TIME:  Fluoroscopy Time: 6 minutes 30 seconds (592 mGy).  COMPLICATIONS: None immediate.  TECHNIQUE: Informed written consent was obtained from the patient after a thorough discussion of the procedural risks, benefits and alternatives. All questions were addressed. Maximal Sterile Barrier Technique was utilized including caps, mask, sterile gowns, sterile gloves, sterile drape, hand hygiene and skin antiseptic. A timeout was performed prior to the initiation of the procedure.  The right forearm to the wrist was prepped and draped in the usual sterile fashion.  The right radial artery was palpated. A dorsal palmar anastomosis was then verified. Ultrasound was utilized to document the morphology of the right radial artery.  Using ultrasound guidance, trans radial access in the right radial artery was obtained with a micropuncture set followed by advancement of a 4/5 French radial sheath over a 0.018 inch  micro guidewire. The obturator and the wire were  removed. Good aspiration was obtained from the side port of the sheath. A cocktail of 2000 units of heparin, 2.5 mg of verapamil, and 200 mcg of nitroglycerin was then injected into the radial sheath in diluted form without event.  A right radial angiogram was then obtained.  Over a 0.035 inch Roadrunner guidewire, a 5 [place] Simmons 2 diagnostic catheter was then advanced to the aortic arch region and selectively positioned in the right vertebral artery, the right common carotid artery, the left common carotid artery and the left subclavian artery.  Following the procedure, hemostasis was achieved with a wrist band in the right radial region. Distal right radial pulse was verified to be present.  FINDINGS: Approximately 50% stenosis at the origin of the right vertebral artery is seen.  The vessel, otherwise, opacifies to the cranial skull base. Wide patency is seen of the right posterior-inferior cerebellar artery with mild narrowing of the right vertebrobasilar junction.  Flow is seen into the basilar artery, the posterior cerebral arteries, the superior cerebellar arteries and the anterior-inferior cerebellar arteries.  There is suggestion of a moderate stenosis just proximal to the origin of the right anterior-inferior cerebellar artery with in flow from the contralateral unopacified blood from the left vertebral artery.  Also seen is an approximately 50-75% stenosis of the right P1 segment and to a lesser degree of the left P1 segment.  A right common carotid arteriogram demonstrates the right external carotid artery and its major branches to be widely patent.  The right internal carotid artery at the bulb to the cranial skull base demonstrates wide patency.  The petrous segment is widely patent.  There is mild fusiform dilatation of the caval cavernous segment.  The supraclinoid segment is widely patent.  The right middle cerebral artery demonstrates severe stenosis within the previously  positioned stent from the proximal 1/3 of the middle cerebral artery to the proximal M2 inferior division.  Also reduced hemodynamic flow in the right and right MCA distribution is seen,  The right anterior cerebral artery opacifies into the capillary and venous phases. Cross-filling via the anterior communicating artery of the left anterior cerebral A2 segment and distally is seen,  A left subclavian arteriogram demonstrates severe high-grade stenosis at the origin of the left vertebral artery.  There is slow ascent of contrast to the cranial skull base with approximately 40% stenosis at the level of C3.  More distally the left vertebrobasilar junction is widely patent.  The opacified portion of the basilar artery, the posterior cerebral arteries, the superior cerebellar arteries and the anterior-inferior cerebellar arteries are patent. Unopacified blood is seen in the basilar artery from the contralateral right vertebral artery.  The left common carotid arteriogram demonstrates the left external carotid artery and its major branches to be widely patent.  The left internal carotid artery has a smooth shallow plaque along the lateral posterior wall without evidence of significant stenosis.  More distally the left internal carotid artery is seen to opacify to the cranial skull base. The petrous and cavernous segments are widely patent.  There is mild diffuse narrowing of the left internal carotid supraclinoid segment. A 50% narrowing is seen of the proximal right anterior cerebral A1 segment, and of the left middle cerebral artery M1 segment.  Distal perfusion in both these vessels appears to be unremarkable.  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  Severe high-grade stenosis at the  origin of the left vertebral artery, with a 10 - 50% stenosis of the mid cervical left vertebral artery.  Approximately 50% stenosis of the left middle cerebral M1  segment, and of the left anterior cerebral A1 segment.  50-75% stenosis of the right posterior cerebral artery P1 segment, and 50% stenosis of the left posterior cerebral artery P1 segment.  Approximately 50% stenosis of the right vertebrobasilar junction just proximal to the basilar artery.  PLAN: Findings reviewed with patient and the patient's husband. Angiographically the patient demonstrates a severe interval development of intra stent stenosis of the right middle cerebral artery, and also of the left vertebral artery at its origin. This despite the patient taking aspirin 81 mg a day, and Brilinta 90 mg a day.  Option of endovascular revascularization of the right middle cerebral artery and probably the left vertebral artery at a latter time was reviewed. The patient does complain of vague symptoms of a lack of energy, and lack of concentration.  Following discussion with the husband and the patient, they expressed the desire to proceed with a revascularization of the severe intra stent right MCA stenosis.  This will be scheduled at the earliest possible.  In the meantime, the patient has been asked to maintain adequate hydration.  They were asked to call 911 should her symptoms worsen.   Electronically Signed   By: Luanne Bras M.D.   On: 10/30/2019 10:51   Ir Angio Intra Extracran Sel Com Carotid Innominate Bilat Mod Sed  Result Date: 11/02/2019 CLINICAL DATA:  Right middle cerebral artery intra stent stenosis on CT angiogram of the head and neck. Vertebrobasilar insufficiency symptoms. EXAM: BILATERAL COMMON CAROTID AND INNOMINATE ANGIOGRAPHY, and bilateral vertebral artery angiograms. COMPARISON:  CT angiogram of the head and neck of October 15, 2019, and also arteriogram of January 04, 2019. MEDICATIONS: Heparin 2000 units IA; no antibiotic was administered within 1 hour of the procedure. ANESTHESIA/SEDATION: Versed 1 mg IV; Fentanyl 25 mcg IV Moderate Sedation Time:  38 minutes The  patient was continuously monitored during the procedure by the interventional radiology nurse under my direct supervision. CONTRAST:  Isovue 300 approximately 60 mL. FLUOROSCOPY TIME:  Fluoroscopy Time: 6 minutes 30 seconds (592 mGy). COMPLICATIONS: None immediate. TECHNIQUE: Informed written consent was obtained from the patient after a thorough discussion of the procedural risks, benefits and alternatives. All questions were addressed. Maximal Sterile Barrier Technique was utilized including caps, mask, sterile gowns, sterile gloves, sterile drape, hand hygiene and skin antiseptic. A timeout was performed prior to the initiation of the procedure. The right forearm to the wrist was prepped and draped in the usual sterile fashion. The right radial artery was palpated. A dorsal palmar anastomosis was then verified. Ultrasound was utilized to document the morphology of the right radial artery. Using ultrasound guidance, trans radial access in the right radial artery was obtained with a micropuncture set followed by advancement of a 4/5 French radial sheath over a 0.018 inch micro guidewire. The obturator and the wire were removed. Good aspiration was obtained from the side port of the sheath. A cocktail of 2000 units of heparin, 2.5 mg of verapamil, and 200 mcg of nitroglycerin was then injected into the radial sheath in diluted form without event. A right radial angiogram was then obtained. Over a 0.035 inch Roadrunner guidewire, a 5 [place] Simmons 2 diagnostic catheter was then advanced to the aortic arch region and selectively positioned in the right vertebral artery, the right common carotid artery, the left common  carotid artery and the left subclavian artery. Following the procedure, hemostasis was achieved with a wrist band in the right radial region. Distal right radial pulse was verified to be present. FINDINGS: Approximately 50% stenosis at the origin of the right vertebral artery is seen. The vessel,  otherwise, opacifies to the cranial skull base. Wide patency is seen of the right posterior-inferior cerebellar artery with mild narrowing of the right vertebrobasilar junction. Flow is seen into the basilar artery, the posterior cerebral arteries, the superior cerebellar arteries and the anterior-inferior cerebellar arteries. There is suggestion of a moderate stenosis just proximal to the origin of the right anterior-inferior cerebellar artery with in flow from the contralateral unopacified blood from the left vertebral artery. Also seen is an approximately 50-75% stenosis of the right P1 segment and to a lesser degree of the left P1 segment. A right common carotid arteriogram demonstrates the right external carotid artery and its major branches to be widely patent. The right internal carotid artery at the bulb to the cranial skull base demonstrates wide patency. The petrous segment is widely patent. There is mild fusiform dilatation of the caval cavernous segment. The supraclinoid segment is widely patent. The right middle cerebral artery demonstrates severe stenosis within the previously positioned stent from the proximal 1/3 of the middle cerebral artery to the proximal M2 inferior division. Also reduced hemodynamic flow in the right and right MCA distribution is seen, The right anterior cerebral artery opacifies into the capillary and venous phases. Cross-filling via the anterior communicating artery of the left anterior cerebral A2 segment and distally is seen, A left subclavian arteriogram demonstrates severe high-grade stenosis at the origin of the left vertebral artery. There is slow ascent of contrast to the cranial skull base with approximately 40% stenosis at the level of C3. More distally the left vertebrobasilar junction is widely patent. The opacified portion of the basilar artery, the posterior cerebral arteries, the superior cerebellar arteries and the anterior-inferior cerebellar arteries are  patent. Unopacified blood is seen in the basilar artery from the contralateral right vertebral artery. The left common carotid arteriogram demonstrates the left external carotid artery and its major branches to be widely patent. The left internal carotid artery has a smooth shallow plaque along the lateral posterior wall without evidence of significant stenosis. More distally the left internal carotid artery is seen to opacify to the cranial skull base. The petrous and cavernous segments are widely patent. There is mild diffuse narrowing of the left internal carotid supraclinoid segment. A 50% narrowing is seen of the proximal right anterior cerebral A1 segment, and of the left middle cerebral artery M1 segment. Distal perfusion in both these vessels appears to be unremarkable. 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 Severe high-grade stenosis at the origin of the left vertebral artery, with a 10 - 50% stenosis of the mid cervical left vertebral artery. Approximately 50% stenosis of the left middle cerebral M1 segment, and of the left anterior cerebral A1 segment. 50-75% stenosis of the right posterior cerebral artery P1 segment, and 50% stenosis of the left posterior cerebral artery P1 segment. Approximately 50% stenosis of the right vertebrobasilar junction just proximal to the basilar artery. PLAN: Findings reviewed with patient and the patient's husband. Angiographically the patient demonstrates a severe interval development of intra stent stenosis of the right middle cerebral artery, and also of the left vertebral artery at its origin. This despite the patient taking aspirin 81 mg a day,  and Brilinta 90 mg a day. Option of endovascular revascularization of the right middle cerebral artery and probably the left vertebral artery at a latter time was reviewed. The patient does complain of vague symptoms of a lack of energy, and lack of concentration.  Following discussion with the husband and the patient, they expressed the desire to proceed with a revascularization of the severe intra stent right MCA stenosis. This will be scheduled at the earliest possible. In the meantime, the patient has been asked to maintain adequate hydration. They were asked to call 911 should her symptoms worsen. Electronically Signed   By: Luanne Bras M.D.   On: 10/30/2019 10:51   Ir Angio Vertebral Sel Subclavian Innominate Uni L Mod Sed  Result Date: 10/30/2019 CLINICAL DATA:  Right middle cerebral artery intra stent stenosis on CT angiogram of the head and neck. Vertebrobasilar insufficiency symptoms.  EXAM: BILATERAL COMMON CAROTID AND INNOMINATE ANGIOGRAPHY, and bilateral vertebral artery angiograms.  COMPARISON:  CT angiogram of the head and neck of October 15, 2019, and also arteriogram of January 04, 2019.  MEDICATIONS: Heparin 2000 units IA; no antibiotic was administered within 1 hour of the procedure.  ANESTHESIA/SEDATION: Versed 1 mg IV; Fentanyl 25 mcg IV  Moderate Sedation Time:  38 minutes  The patient was continuously monitored during the procedure by the interventional radiology nurse under my direct supervision.  CONTRAST:  Isovue 300 approximately 60 mL.  FLUOROSCOPY TIME:  Fluoroscopy Time: 6 minutes 30 seconds (592 mGy).  COMPLICATIONS: None immediate.  TECHNIQUE: Informed written consent was obtained from the patient after a thorough discussion of the procedural risks, benefits and alternatives. All questions were addressed. Maximal Sterile Barrier Technique was utilized including caps, mask, sterile gowns, sterile gloves, sterile drape, hand hygiene and skin antiseptic. A timeout was performed prior to the initiation of the procedure.  The right forearm to the wrist was prepped and draped in the usual sterile fashion.  The right radial artery was palpated. A dorsal palmar anastomosis was then verified. Ultrasound was utilized to document  the morphology of the right radial artery.  Using ultrasound guidance, trans radial access in the right radial artery was obtained with a micropuncture set followed by advancement of a 4/5 French radial sheath over a 0.018 inch micro guidewire. The obturator and the wire were removed. Good aspiration was obtained from the side port of the sheath. A cocktail of 2000 units of heparin, 2.5 mg of verapamil, and 200 mcg of nitroglycerin was then injected into the radial sheath in diluted form without event.  A right radial angiogram was then obtained.  Over a 0.035 inch Roadrunner guidewire, a 5 [place] Simmons 2 diagnostic catheter was then advanced to the aortic arch region and selectively positioned in the right vertebral artery, the right common carotid artery, the left common carotid artery and the left subclavian artery.  Following the procedure, hemostasis was achieved with a wrist band in the right radial region. Distal right radial pulse was verified to be present.  FINDINGS: Approximately 50% stenosis at the origin of the right vertebral artery is seen.  The vessel, otherwise, opacifies to the cranial skull base. Wide patency is seen of the right posterior-inferior cerebellar artery with mild narrowing of the right vertebrobasilar junction.  Flow is seen into the basilar artery, the posterior cerebral arteries, the superior cerebellar arteries and the anterior-inferior cerebellar arteries.  There is suggestion of a moderate stenosis just proximal to the origin of the right anterior-inferior cerebellar artery with  in flow from the contralateral unopacified blood from the left vertebral artery.  Also seen is an approximately 50-75% stenosis of the right P1 segment and to a lesser degree of the left P1 segment.  A right common carotid arteriogram demonstrates the right external carotid artery and its major branches to be widely patent.  The right internal carotid artery at the bulb to the cranial skull  base demonstrates wide patency.  The petrous segment is widely patent.  There is mild fusiform dilatation of the caval cavernous segment.  The supraclinoid segment is widely patent.  The right middle cerebral artery demonstrates severe stenosis within the previously positioned stent from the proximal 1/3 of the middle cerebral artery to the proximal M2 inferior division.  Also reduced hemodynamic flow in the right and right MCA distribution is seen,  The right anterior cerebral artery opacifies into the capillary and venous phases. Cross-filling via the anterior communicating artery of the left anterior cerebral A2 segment and distally is seen,  A left subclavian arteriogram demonstrates severe high-grade stenosis at the origin of the left vertebral artery.  There is slow ascent of contrast to the cranial skull base with approximately 40% stenosis at the level of C3.  More distally the left vertebrobasilar junction is widely patent.  The opacified portion of the basilar artery, the posterior cerebral arteries, the superior cerebellar arteries and the anterior-inferior cerebellar arteries are patent. Unopacified blood is seen in the basilar artery from the contralateral right vertebral artery.  The left common carotid arteriogram demonstrates the left external carotid artery and its major branches to be widely patent.  The left internal carotid artery has a smooth shallow plaque along the lateral posterior wall without evidence of significant stenosis.  More distally the left internal carotid artery is seen to opacify to the cranial skull base. The petrous and cavernous segments are widely patent.  There is mild diffuse narrowing of the left internal carotid supraclinoid segment. A 50% narrowing is seen of the proximal right anterior cerebral A1 segment, and of the left middle cerebral artery M1 segment.  Distal perfusion in both these vessels appears to be unremarkable.  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  Severe high-grade stenosis at the origin of the left vertebral artery, with a 10 - 50% stenosis of the mid cervical left vertebral artery.  Approximately 50% stenosis of the left middle cerebral M1 segment, and of the left anterior cerebral A1 segment.  50-75% stenosis of the right posterior cerebral artery P1 segment, and 50% stenosis of the left posterior cerebral artery P1 segment.  Approximately 50% stenosis of the right vertebrobasilar junction just proximal to the basilar artery.  PLAN: Findings reviewed with patient and the patient's husband. Angiographically the patient demonstrates a severe interval development of intra stent stenosis of the right middle cerebral artery, and also of the left vertebral artery at its origin. This despite the patient taking aspirin 81 mg a day, and Brilinta 90 mg a day.  Option of endovascular revascularization of the right middle cerebral artery and probably the left vertebral artery at a latter time was reviewed. The patient does complain of vague symptoms of a lack of energy, and lack of concentration.  Following discussion with the husband and the patient, they expressed the desire to proceed with a revascularization of the severe intra stent right MCA stenosis.  This will be scheduled at the earliest possible.  In the meantime, the patient has  been asked to maintain adequate hydration.  They were asked to call 911 should her symptoms worsen.   Electronically Signed   By: Luanne Bras M.D.   On: 10/30/2019 10:51   Ir Angio Vertebral Sel Vertebral Uni R Mod Sed  Result Date: 10/30/2019 CLINICAL DATA:  Right middle cerebral artery intra stent stenosis on CT angiogram of the head and neck. Vertebrobasilar insufficiency symptoms.  EXAM: BILATERAL COMMON CAROTID AND INNOMINATE ANGIOGRAPHY, and bilateral vertebral artery angiograms.  COMPARISON:  CT angiogram of the head and neck of  October 15, 2019, and also arteriogram of January 04, 2019.  MEDICATIONS: Heparin 2000 units IA; no antibiotic was administered within 1 hour of the procedure.  ANESTHESIA/SEDATION: Versed 1 mg IV; Fentanyl 25 mcg IV  Moderate Sedation Time:  38 minutes  The patient was continuously monitored during the procedure by the interventional radiology nurse under my direct supervision.  CONTRAST:  Isovue 300 approximately 60 mL.  FLUOROSCOPY TIME:  Fluoroscopy Time: 6 minutes 30 seconds (592 mGy).  COMPLICATIONS: None immediate.  TECHNIQUE: Informed written consent was obtained from the patient after a thorough discussion of the procedural risks, benefits and alternatives. All questions were addressed. Maximal Sterile Barrier Technique was utilized including caps, mask, sterile gowns, sterile gloves, sterile drape, hand hygiene and skin antiseptic. A timeout was performed prior to the initiation of the procedure.  The right forearm to the wrist was prepped and draped in the usual sterile fashion.  The right radial artery was palpated. A dorsal palmar anastomosis was then verified. Ultrasound was utilized to document the morphology of the right radial artery.  Using ultrasound guidance, trans radial access in the right radial artery was obtained with a micropuncture set followed by advancement of a 4/5 French radial sheath over a 0.018 inch micro guidewire. The obturator and the wire were removed. Good aspiration was obtained from the side port of the sheath. A cocktail of 2000 units of heparin, 2.5 mg of verapamil, and 200 mcg of nitroglycerin was then injected into the radial sheath in diluted form without event.  A right radial angiogram was then obtained.  Over a 0.035 inch Roadrunner guidewire, a 5 [place] Simmons 2 diagnostic catheter was then advanced to the aortic arch region and selectively positioned in the right vertebral artery, the right common carotid artery, the left common carotid artery and the  left subclavian artery.  Following the procedure, hemostasis was achieved with a wrist band in the right radial region. Distal right radial pulse was verified to be present.  FINDINGS: Approximately 50% stenosis at the origin of the right vertebral artery is seen.  The vessel, otherwise, opacifies to the cranial skull base. Wide patency is seen of the right posterior-inferior cerebellar artery with mild narrowing of the right vertebrobasilar junction.  Flow is seen into the basilar artery, the posterior cerebral arteries, the superior cerebellar arteries and the anterior-inferior cerebellar arteries.  There is suggestion of a moderate stenosis just proximal to the origin of the right anterior-inferior cerebellar artery with in flow from the contralateral unopacified blood from the left vertebral artery.  Also seen is an approximately 50-75% stenosis of the right P1 segment and to a lesser degree of the left P1 segment.  A right common carotid arteriogram demonstrates the right external carotid artery and its major branches to be widely patent.  The right internal carotid artery at the bulb to the cranial skull base demonstrates wide patency.  The petrous segment is widely patent.  There  is mild fusiform dilatation of the caval cavernous segment.  The supraclinoid segment is widely patent.  The right middle cerebral artery demonstrates severe stenosis within the previously positioned stent from the proximal 1/3 of the middle cerebral artery to the proximal M2 inferior division.  Also reduced hemodynamic flow in the right and right MCA distribution is seen,  The right anterior cerebral artery opacifies into the capillary and venous phases. Cross-filling via the anterior communicating artery of the left anterior cerebral A2 segment and distally is seen,  A left subclavian arteriogram demonstrates severe high-grade stenosis at the origin of the left vertebral artery.  There is slow ascent of contrast to  the cranial skull base with approximately 40% stenosis at the level of C3.  More distally the left vertebrobasilar junction is widely patent.  The opacified portion of the basilar artery, the posterior cerebral arteries, the superior cerebellar arteries and the anterior-inferior cerebellar arteries are patent. Unopacified blood is seen in the basilar artery from the contralateral right vertebral artery.  The left common carotid arteriogram demonstrates the left external carotid artery and its major branches to be widely patent.  The left internal carotid artery has a smooth shallow plaque along the lateral posterior wall without evidence of significant stenosis.  More distally the left internal carotid artery is seen to opacify to the cranial skull base. The petrous and cavernous segments are widely patent.  There is mild diffuse narrowing of the left internal carotid supraclinoid segment. A 50% narrowing is seen of the proximal right anterior cerebral A1 segment, and of the left middle cerebral artery M1 segment.  Distal perfusion in both these vessels appears to be unremarkable.  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  Severe high-grade stenosis at the origin of the left vertebral artery, with a 10 - 50% stenosis of the mid cervical left vertebral artery.  Approximately 50% stenosis of the left middle cerebral M1 segment, and of the left anterior cerebral A1 segment.  50-75% stenosis of the right posterior cerebral artery P1 segment, and 50% stenosis of the left posterior cerebral artery P1 segment.  Approximately 50% stenosis of the right vertebrobasilar junction just proximal to the basilar artery.  PLAN: Findings reviewed with patient and the patient's husband. Angiographically the patient demonstrates a severe interval development of intra stent stenosis of the right middle cerebral artery, and also of the left vertebral artery at its  origin. This despite the patient taking aspirin 81 mg a day, and Brilinta 90 mg a day.  Option of endovascular revascularization of the right middle cerebral artery and probably the left vertebral artery at a latter time was reviewed. The patient does complain of vague symptoms of a lack of energy, and lack of concentration.  Following discussion with the husband and the patient, they expressed the desire to proceed with a revascularization of the severe intra stent right MCA stenosis.  This will be scheduled at the earliest possible.  In the meantime, the patient has been asked to maintain adequate hydration.  They were asked to call 911 should her symptoms worsen.   Electronically Signed   By: Luanne Bras M.D.   On: 10/30/2019 10:51    Labs:  CBC: Recent Labs    01/04/19 0619 01/04/19 1136 01/05/19 0457 09/30/19 1833 10/30/19 0631  WBC 4.7  --  8.6 4.0 4.4  HGB 13.9 8.5* 11.9* 13.2 14.6  HCT 42.2 25.0* 35.1* 39.5 43.6  PLT 288  --  279 185 199    COAGS: Recent Labs    12/24/18 0750 01/04/19 0619 10/30/19 0631  INR 1.07 0.96 1.0  APTT 22*  --   --     BMP: Recent Labs    01/04/19 0619 01/04/19 1136 01/05/19 0457 09/30/19 1833 10/30/19 0631  NA 137 141 139 140 142  K 3.7 3.5 3.7 4.1 4.2  CL 104  --  112* 107 104  CO2 23  --  20* 23 26  GLUCOSE 97  --  121* 107* 93  BUN 14  --  7* 18 19  CALCIUM 9.7  --  8.9 9.5 9.8  CREATININE 0.77  --  0.66 0.79 0.71  GFRNONAA >60  --  >60 >60 >60  GFRAA >60  --  >60 >60 >60    LIVER FUNCTION TESTS: Recent Labs    12/08/18 0929 12/24/18 0750  BILITOT 0.5 0.9  AST 24 26  ALT 21 21  ALKPHOS 73 63  PROT 7.1 7.0  ALBUMIN 4.3 4.1    TUMOR MARKERS: No results for input(s): AFPTM, CEA, CA199, CHROMGRNA in the last 8760 hours.  Assessment and Plan: Mrs. Koep is a 73 year old female known to NIR due to history of R MCA stenosis who presents with complaint of high-grade intra-stent stenosis recently identified  on angiogram 10/30/19.   Mrs. Shell has discussed her results with Dr. Estanislado Pandy and elects to proceed with repeat angiogram with intent to treat severe stenosis if redemonstrated on angiogram today.   Patient presents today in their usual state of health.  She has been NPO and is not currently on blood thinners. She has received her Brilinta and aspirin.  Risks and benefits of cerebral angiogram with intervention were discussed with the patient including, but not limited to bleeding, infection, vascular injury, contrast induced renal failure, stroke or even death.  This interventional procedure involves the use of X-rays and because of the nature of the planned procedure, it is possible that we will have prolonged use of X-ray fluoroscopy.  Potential radiation risks to you include (but are not limited to) the following: - A slightly elevated risk for cancer  several years later in life. This risk is typically less than 0.5% percent. This risk is low in comparison to the normal incidence of human cancer, which is 33% for women and 50% for men according to the [place]. - Radiation induced injury can include skin redness, resembling a rash, tissue breakdown / ulcers and hair loss (which can be temporary or permanent).   The likelihood of either of these occurring depends on the difficulty of the procedure and whether you are sensitive to radiation due to previous procedures, disease, or genetic conditions.   IF your procedure requires a prolonged use of radiation, you will be notified and given written instructions for further action.  It is your responsibility to monitor the irradiated area for the 2 weeks following the procedure and to notify your physician if you are concerned that you have suffered a radiation induced injury.    All of the patient's questions were answered, patient is agreeable to proceed.  Consent signed and in chart.  Plan to proceed today after  assessment of bed availability during COVID pandemic as patient is aware she will need admission overnight for observation to a neuro ICU.   Thank you for this interesting consult.  I greatly enjoyed meeting Teagin Christoffel and look forward to participating in their care.  A copy  of this report was sent to the requesting provider on this date.  Electronically Signed: Docia Barrier, PA 11/21/2019, 6:33 AM   I spent a total of  30 Minutes   in face to face in clinical consultation, greater than 50% of which was counseling/coordinating care for R MCA stenosis.

## 2019-11-21 NOTE — Sedation Documentation (Signed)
ACT 235, Dr Estanislado Pandy notified

## 2019-11-21 NOTE — Sedation Documentation (Signed)
Verbal order received from Dr. Estanislado Pandy that heparin gtt is not to be started until PTT is resulted and he is notified of the result.

## 2019-11-21 NOTE — Progress Notes (Signed)
NIR.  Patient underwent an image-guided cerebral arteriogram with revascularization of right MCA M1 segment severe pre-occlusive intra-stent stenosis using stenting and angioplasty via right femoral approach this AM by Dr. Estanislado Pandy.  Went and evaluated patient bedside alongside Dr. Estanislado Pandy in neuro ICU following procedure. Patient awake and alert laying in bed. Accompanied by husband at bedside. Complains of left groin/hip pain. Denies headache, weakness, numbness/tingling, dizziness, vision changes, hearing changes, tinnitus, or speech difficulty.  Alert, awake, and oriented x3. Speech and comprehension intact. PERRL bilaterally. EOMs intact bilaterally without nystagmus or subjective diplopia. No facial asymmetry. Tongue mildly deviated to right. Can spontaneously move all extremities with 4+/5 hand grip of left and 5/5 hand grip of right. No pronator drift. Distal pulses 1+ bilaterally. Right groin incision soft without active bleeding or hematoma.  Plan to stay in neuro ICU for overnight observation. BP goal = 0000000 systolic. Continue taking Brilinta 45 mg twice daily and Aspirin 81 mg once daily. NIR to follow.   Shannon Graff Louk, PA-C 11/21/2019, 5:31 PM

## 2019-11-21 NOTE — Progress Notes (Signed)
Order placed for 4N ICU bed per Dr Estanislado Pandy. I spoke with Hilliard Clark, from bed placement to confirm he is able to see the order.

## 2019-11-21 NOTE — Sedation Documentation (Signed)
ACT 235, notified Dr Estanislado Pandy

## 2019-11-21 NOTE — Sedation Documentation (Signed)
ACT 313, notified Dr Estanislado Pandy

## 2019-11-21 NOTE — Sedation Documentation (Signed)
ACT 230, Dr Estanislado Pandy notified

## 2019-11-21 NOTE — Sedation Documentation (Signed)
8FR angioseal deployed to right femoral artery by Dr Estanislado Pandy and sheath removed.  [place] IR Tech holding pressure to right groin site

## 2019-11-21 NOTE — Progress Notes (Addendum)
Paged Brynda Greathouse, PA-C. Aware P2Y12 is less than 8. Spoke with Dr. Estanislado Pandy, okay to proceed. Verified pt to receive Brilinta and Nimotop. D/C 81mg  ASA

## 2019-11-21 NOTE — Sedation Documentation (Signed)
ACT 257.  Dr. Estanislado Pandy notified

## 2019-11-21 NOTE — Progress Notes (Signed)
Patient ID: Tiawna Gillett, female   DOB: Aug 03, 1946, 73 y.o.   MRN: [id number] INR. Post procedure. Patient extubated. C/O  A sore throat.  Gradually waking up to obeying simple commands appropriately. Moves all 4 s to command. Pupils 2 mm RT = LT tongue midline.   RT groin soft with pressure dressing. Distal pulses palpable  DPs and PTs bilaterally... S.Chiquita Heckert ND

## 2019-11-21 NOTE — Progress Notes (Addendum)
ANTICOAGULATION CONSULT NOTE - Initial Consult  Pharmacy Consult for Heparin Indication: Post-Interventional Radiology  Allergies  Allergen Reactions  . Sulfa Antibiotics Other (See Comments)    Tongue turned black  . Mandol [Cefamandole] Rash  . Sinutab Non-Drowsy Max [Pseudoephedrine-Acetaminophen] Palpitations    Patient Measurements: Height: 5\' 5"  (165.1 cm) Weight: 117 lb (53.1 kg) IBW/kg (Calculated) : 57 Heparin Dosing Weight: 53.1 kg  Vital Signs: Temp: 97.7 F (36.5 C) (12/09 1430) Temp Source: Oral (12/09 0647) BP: 125/56 (12/09 1500) Pulse Rate: 80 (12/09 1500)  Labs: Recent Labs    11/21/19 0635 11/21/19 1400  HGB 13.3  --   HCT 39.9  --   PLT 187  --   APTT 30 >200*  LABPROT 12.5  --   INR 0.9  --   CREATININE 0.74  --     Estimated Creatinine Clearance: 52.5 mL/min (by C-G formula based on SCr of 0.74 mg/dL).   Medical History: Past Medical History:  Diagnosis Date  . Anxiety   . 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 Arthritis   . Cancer (HCC)    skin - squamous cell  . Complication of anesthesia    " I had difficulty coming out of it"  . Dyspnea    with exertion  . Family history of adverse reaction to anesthesia    " My daughter has a problem coming ou to it"  . GERD (gastroesophageal reflux disease)   . Headache    migraines in the past  . Hypertension   . Hypothyroidism   . Stroke ([place]) 11/2018   some weakness on right side   . 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  She underwent diagnostic angiogram 10/30/19 which showed high-grade intra-stent stenosis. Today, pt is S/P right common carotid arteriogram followed by angioplasty and stenting of severe pre-occlusive intrastent stenosis of right MCA M1 seg. Pt was on no anticoagulants prior to admission. Pharmacy is consulted to dose heparin after the procedure; heparin is  scheduled to stop at 8 AM tomorrow morning.  H/H 13.3/39.9, platelets 187  Per PACU RN, Dr. Estanislado Pandy instructed her to restart heparin at 16:15 PM this afternoon (ACT 186, aPTT >200).  Goal of Therapy:  Heparin level 0.1-0.25 units/ml Monitor platelets by anticoagulation protocol: Yes   Plan:  Heparin infusion at 450 units/hr, starting at 16:15 PM this afternoon Check 8-hr heparin level Monitor daily heparin level, CBC while on heparin Monitor for signs/symptoms of bleeding  Gillermina Hu, PharmD, BCPS, [facility] Clinical Pharmacist 11/21/2019,3:08 PM

## 2019-11-21 NOTE — Sedation Documentation (Signed)
Dr Estanislado Pandy made aware of bleeding from mouth, nose, and and labia folds

## 2019-11-21 NOTE — Procedures (Signed)
S/P RT common carotid arteriogram followed  by angioplasty and stenting of severe pre occlusive intrastent stenosis  Of RT MCA M1 seg. S.Masayo Fera MD

## 2019-11-21 NOTE — Anesthesia Procedure Notes (Signed)
Procedure Name: Intubation Date/Time: 11/21/2019 9:38 AM Performed by: Lowella Dell, CRNA Pre-anesthesia Checklist: Patient identified, Emergency Drugs available, Suction available and Patient being monitored Patient Re-evaluated:Patient Re-evaluated prior to induction Oxygen Delivery Method: Circle System Utilized Preoxygenation: Pre-oxygenation with 100% oxygen Induction Type: IV induction Ventilation: Mask ventilation without difficulty and Oral airway inserted - appropriate to patient size Laryngoscope Size: Mac and 3 Grade View: Grade II Tube type: Oral Tube size: 7.0 mm Number of attempts: 1 Airway Equipment and Method: Stylet and Oral airway Placement Confirmation: ETT inserted through vocal cords under direct vision,  positive ETCO2 and breath sounds checked- equal and bilateral Secured at: 22 cm Tube secured with: Tape Dental Injury: Teeth and Oropharynx as per pre-operative assessment

## 2019-11-22 ENCOUNTER — Other Ambulatory Visit: Payer: Self-pay | Admitting: Radiology

## 2019-11-22 ENCOUNTER — Telehealth: Payer: Medicare Other | Admitting: Cardiology

## 2019-11-22 ENCOUNTER — Other Ambulatory Visit (HOSPITAL_COMMUNITY): Payer: Self-pay | Admitting: Interventional Radiology

## 2019-11-22 ENCOUNTER — Telehealth (HOSPITAL_COMMUNITY): Payer: Self-pay

## 2019-11-22 DIAGNOSIS — I771 Stricture of artery: Secondary | ICD-10-CM

## 2019-11-22 DIAGNOSIS — I6601 Occlusion and stenosis of right middle cerebral artery: Principal | ICD-10-CM

## 2019-11-22 LAB — BASIC METABOLIC PANEL
Anion gap: 8 (ref 5–15)
BUN: 6 mg/dL — ABNORMAL LOW (ref 8–23)
CO2: 21 mmol/L — ABNORMAL LOW (ref 22–32)
Calcium: 8 mg/dL — ABNORMAL LOW (ref 8.9–10.3)
Chloride: 108 mmol/L (ref 98–111)
Creatinine, Ser: 0.59 mg/dL (ref 0.44–1.00)
GFR calc Af Amer: >60 mL/min (ref >60)
GFR calc non Af Amer: >60 mL/min (ref >60)
Glucose, Bld: 89 mg/dL (ref 70–99)
Potassium: 3 mmol/L — ABNORMAL LOW (ref 3.5–5.1)
Sodium: 137 mmol/L (ref 135–145)

## 2019-11-22 LAB — CBC WITH DIFFERENTIAL/PLATELET
Abs Immature Granulocytes: 0.01 10*3/uL (ref 0.00–0.07)
Basophils Absolute: 0 10*3/uL (ref 0.0–0.1)
Basophils Relative: 1 %
Eosinophils Absolute: 0 10*3/uL (ref 0.0–0.5)
Eosinophils Relative: 0 %
HCT: 32.5 % — ABNORMAL LOW (ref 36.0–46.0)
Hemoglobin: 11.1 g/dL — ABNORMAL LOW (ref 12.0–15.0)
Immature Granulocytes: 0 %
Lymphocytes Relative: 22 %
Lymphs Abs: 1.3 10*3/uL (ref 0.7–4.0)
MCH: 31.9 pg (ref 26.0–34.0)
MCHC: 34.2 g/dL (ref 30.0–36.0)
MCV: 93.4 fL (ref 80.0–100.0)
Monocytes Absolute: 0.4 10*3/uL (ref 0.1–1.0)
Monocytes Relative: 6 %
Neutro Abs: 4.4 10*3/uL (ref 1.7–7.7)
Neutrophils Relative %: 71 %
Platelets: 173 10*3/uL (ref 150–400)
RBC: 3.48 MIL/uL — ABNORMAL LOW (ref 3.87–5.11)
RDW: 11.8 % (ref 11.5–15.5)
WBC: 6.2 10*3/uL (ref 4.0–10.5)
nRBC: 0 % (ref 0.0–0.2)

## 2019-11-22 LAB — HEPARIN LEVEL (UNFRACTIONATED): Heparin Unfractionated: 0.18 IU/mL — ABNORMAL LOW (ref 0.30–0.70)

## 2019-11-22 LAB — PLATELET INHIBITION P2Y12: Platelet Function  P2Y12: 62 [PRU] — ABNORMAL LOW (ref 182–335)

## 2019-11-22 MED ORDER — LEVOTHYROXINE SODIUM 75 MCG PO TABS
75.0000 ug | ORAL_TABLET | ORAL | Status: DC
  Administered 2019-11-22: 75 ug via ORAL
  Filled 2019-11-22: qty 1

## 2019-11-22 NOTE — Evaluation (Signed)
Physical Therapy Evaluation Patient Details Name: Shannon [name] MRN: [id number] DOB: [birth year] Today's Date: 11/22/2019   History of Present Illness  Shannon [name] is a 73 y.o. 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 Estanislado Pandy.  She underwent diagnostic angiogram 10/30/19 which showed high-grade intra-stent stenosis.  After discussing results post-procedure, a plan was made to proceed with repeat angiogram with intent to treat her newly found intra-stent stenosis.  On admission she report seakness on L greater than R.  Clinical Impression  .Pt is at or close to baseline functioning and should be safe at home with available assist. There are no further acute PT needs.  Will sign off at this time.     Follow Up Recommendations No PT follow up;Supervision/Assistance - 24 hour    Equipment Recommendations  None recommended by PT    Recommendations for Other Services       Precautions / Restrictions Precautions Precautions: None(very minimal fall risk,)      Mobility  Bed Mobility Overal bed mobility: Needs Assistance Bed Mobility: Supine to Sit     Supine to sit: Supervision        Transfers Overall transfer level: Needs assistance   Transfers: Sit to/from Stand Sit to Stand: Min guard            Ambulation/Gait Ambulation/Gait assistance: Supervision;Min guard Gait Distance (Feet): 200 Feet Assistive device: None Gait Pattern/deviations: Step-through pattern Gait velocity: slower to moderate   General Gait Details: mildly unsteady with minor deviation that pt can manage safely  Stairs Stairs: Yes Stairs assistance: Supervision Stair Management: One rail Right;Step to pattern;Forwards Number of Stairs: 4 General stair comments: safe and steady with rail  Wheelchair Mobility    Modified Rankin (Stroke Patients Only) Modified Rankin (Stroke Patients Only) Pre-Morbid Rankin Score:  Slight disability Modified Rankin: No significant disability     Balance Overall balance assessment: Mild deficits observed, not formally tested                                           Pertinent Vitals/Pain Pain Assessment: Faces Faces Pain Scale: Hurts little more Pain Location: R leg incision Pain Descriptors / Indicators: Aching;Sore;Operative site guarding Pain Intervention(s): Monitored during session    Home Living Family/patient expects to be discharged to:: Private residence Living Arrangements: Spouse/significant other Available Help at Discharge: Family;Available 24 hours/day Type of Home: House Home Access: Stairs to enter Entrance Stairs-Rails: None Entrance Stairs-Number of Steps: 1 Home Layout: Two level;Able to live on main level with bedroom/bathroom Home Equipment: Shower seat - built in      Prior Function Level of Independence: Independent               Hand Dominance   Dominant Hand: Right    Extremity/Trunk Assessment   Upper Extremity Assessment Upper Extremity Assessment: Overall WFL for tasks assessed    Lower Extremity Assessment Lower Extremity Assessment: Overall WFL for tasks assessed(mild proximal weakness due to pain)       Communication   Communication: No difficulties  Cognition Arousal/Alertness: Awake/alert Behavior During Therapy: WFL for tasks assessed/performed Overall Cognitive Status: Within Functional Limits for tasks assessed  General Comments General comments (skin integrity, edema, etc.): vss through out    Exercises     Assessment/Plan    PT Assessment Patent does not need any further PT services  PT Problem List         PT Treatment Interventions      PT Goals (Current goals can be found in the Care Plan section)  Acute Rehab PT Goals Patient Stated Goal: home PT Goal Formulation: All assessment and education complete, DC  therapy    Frequency     Barriers to discharge        Co-evaluation PT/OT/SLP Co-Evaluation/Treatment: Yes Reason for Co-Treatment: To address functional/ADL transfers PT goals addressed during session: Mobility/safety with mobility OT goals addressed during session: ADL's and self-care       AM-PAC PT "6 Clicks" Mobility  Outcome Measure Help needed turning from your back to your side while in a flat bed without using bedrails?: None Help needed moving from lying on your back to sitting on the side of a flat bed without using bedrails?: None Help needed moving to and from a bed to a chair (including a wheelchair)?: None Help needed standing up from a chair using your arms (e.g., wheelchair or bedside chair)?: None Help needed to walk in hospital room?: None Help needed climbing 3-5 steps with a railing? : None 6 Click Score: 24    End of Session   Activity Tolerance: Patient tolerated treatment well Patient left: in chair;with call bell/phone within reach;with family/visitor present Nurse Communication: Mobility status PT Visit Diagnosis: Unsteadiness on feet (R26.81);Other abnormalities of gait and mobility (R26.89);Pain Pain - part of body: (R leg)    Time: [id number] PT Time Calculation (min) (ACUTE ONLY): 23 min   Charges:   PT Evaluation $PT Eval Moderate Complexity: 1 Mod          11/22/2019  Donnella Sham, PT Acute Rehabilitation Services [phone]  (pager) [phone]  (office)  Shannon [name] 11/22/2019, 12:07 PM

## 2019-11-22 NOTE — Discharge Instructions (Signed)
Cerebral Angiogram, Care After This sheet gives you information about how to care for yourself after your procedure. Your health care provider may also give you more specific instructions. If you have problems or questions, contact your health care provider. What can I expect after the procedure? After your procedure, it is common to have:  Bruising and tenderness at the catheter insertion site.  A mild headache. Follow these instructions at home: Insertion site care   Follow instructions from your health care provider about how to take care of the insertion site. Make sure you: ? Wash your hands with soap and water before you change your bandage (dressing). If soap and water are not available, use hand sanitizer. ? Change your dressing as told by your health care provider. ? Leave stitches (sutures), skin glue, or adhesive strips in place. These skin closures may need to stay in place for 2 weeks or longer. If adhesive strip edges start to loosen and curl up, you may trim the loose edges. Do not remove adhesive strips completely unless your health care provider tells you to do that.  Do not apply powder or lotion to the site.  Do not take baths, swim, or use a hot tub until your health care provider says it is okay to do so.  You may shower 24-48 hours after the procedure or as told by your health care provider. ? Gently wash the site with plain soap and water. ? Pat the area dry with a clean towel. ? Do not rub the site. This may cause bleeding.  Check your incision area every day for signs of infection. Check for: ? Redness, swelling, or pain. ? Fluid or blood. ? Warmth. ? Pus or a bad smell. Activity  Rest as told by your health care provider.  Do not lift anything that is heavier than 10 lb (4.5 kg), or the limit that your health care provider tells you, until he or she says that it is safe.  Do not drive for 24 hours if you were given a medicine to help you relax  (sedative).  Do not drive or use heavy machinery while taking prescription pain medicine. General instructions   Return to your normal activities as told by your health care provider. Ask your health care provider what activities are safe for you.  If the catheter site starts to bleed, lie flat and put pressure on the site.  Drink enough fluid to keep your urine clear or pale yellow. This helps flush the contrast dye from your body.  Take over-the-counter and prescription medicines only as told by your health care provider.  Keep all follow-up visits as directed by your health care provider. This is important. Contact a health care provider if:  You have a fever or chills.  You have redness, swelling, or more pain around your insertion site.  You have fluid or blood coming from your insertion site.  The insertion site feels warm to the touch.  You have pus or a bad smell coming from your insertion site.  You have more bruising around the insertion site.  Blood collects in the tissue around the catheter site (hematoma), and the hematoma may be painful to the touch. Get help right away if:  You have vision changes or loss of vision.  The catheter insertion area swells very fast.  You have numbness or weakness on one side of your body.  You have trouble talking, or you have slurred speech or cannot speak (aphasia).  You feel confused or have trouble remembering.  You have severe pain at the catheter insertion area.  The catheter insertion area is bleeding, and the bleeding does not stop after 30 minutes of holding steady pressure on the site.  The arm or leg where the catheter was inserted is numb, tingling, or cold.  You have chest pain.  You have trouble breathing.  You have a rash.  You have trouble using the arm or leg where the catheter was inserted. These symptoms may represent a serious problem that is an emergency. Do not wait to see if the symptoms will go  away. Get medical help right away. Call your local emergency services (911 in [place] Do not drive yourself to the hospital. Summary  After your procedure, it is common to have bruising and tenderness at the site where the catheter was inserted.  If your catheter insertion site begins to bleed, put direct pressure on it until the bleeding stops.  After your procedure, it is important to rest and drink plenty of fluids.  Return to your normal activities as told by your health care provider. Ask your health care provider what activities are safe for you. This information is not intended to replace advice given to you by your health care provider. Make sure you discuss any questions you have with your health care provider. Document Released: 04/15/2014 Document Revised: 11/11/2017 Document Reviewed: 01/03/2017 Elsevier Patient Education  2020 [employer].

## 2019-11-22 NOTE — Evaluation (Signed)
Occupational Therapy Evaluation Patient Details Name: Shannon [name] MRN: [id number] DOB: Apr 23, 1946 Today's Date: 11/22/2019    History of Present Illness Shannon [name] is a 73 y.o. 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 Estanislado Pandy.  She underwent diagnostic angiogram 10/30/19 which showed high-grade intra-stent stenosis.  After discussing results post-procedure, a plan was made to proceed with repeat angiogram with intent to treat her newly found intra-stent stenosis.  On admission she report seakness on L greater than R.   Clinical Impression   Pt admitted with above diagnoses, presenting with RLE incisional pain. PTA pt living with spouse, independent with BADL and mobility. Pt is very close to baseline, seems only to have BADL mobility issues 2/2 to the incisional pain mentioned above. She was able to complete toilet transfer and standing grooming at min guard. She also shows the ability to complete beyond household level of functional mobility without DME. No further OT needs identified. OT will sign off. Thank you for this consult.    Follow Up Recommendations  No OT follow up;Supervision - Intermittent    Equipment Recommendations  None recommended by OT    Recommendations for Other Services       Precautions / Restrictions Precautions Precautions: Fall      Mobility Bed Mobility Overal bed mobility: Needs Assistance Bed Mobility: Supine to Sit     Supine to sit: Supervision        Transfers Overall transfer level: Needs assistance   Transfers: Sit to/from Stand Sit to Stand: Min guard         General transfer comment: for ssafety, stiffness with initial stand    Balance Overall balance assessment: Mild deficits observed, not formally tested                                         ADL either performed or assessed with clinical judgement   ADL Overall ADL's : Needs  assistance/impaired Eating/Feeding: Set up;Sitting   Grooming: Min guard;Standing;Wash/dry hands;Oral care;Brushing hair Grooming Details (indicate cue type and reason): at sink in BR Upper Body Bathing: Set up;Sitting   Lower Body Bathing: Minimal assistance;Sit to/from stand Lower Body Bathing Details (indicate cue type and reason): 2/2 RLE pain Upper Body Dressing : Set up;Sitting   Lower Body Dressing: Minimal assistance;Sit to/from stand;Sitting/lateral leans Lower Body Dressing Details (indicate cue type and reason): 2/2 to RLE pain Toilet Transfer: Min guard;Regular Toilet;Grab bars Toilet Transfer Details (indicate cue type and reason): BR in room, slow to descend and use of grab bars 2/2 pain Toileting- Clothing Manipulation and Hygiene: Set up;Sitting/lateral lean   Tub/ Shower Transfer: Min guard;Shower seat;Ambulation   Functional mobility during ADLs: Min guard General ADL Comments: pt limited by RLE pain but close to baseline     Vision Baseline Vision/History: Wears glasses Wears Glasses: At all times Patient Visual Report: No change from baseline       Perception     Praxis      Pertinent Vitals/Pain Pain Assessment: Faces Faces Pain Scale: Hurts little more Pain Location: R leg incision Pain Descriptors / Indicators: Aching;Sore;Operative site guarding Pain Intervention(s): Limited activity within patient's tolerance     Hand Dominance Right   Extremity/Trunk Assessment Upper Extremity Assessment Upper Extremity Assessment: Overall WFL for tasks assessed   Lower Extremity Assessment Lower Extremity Assessment: Defer to  PT evaluation       Communication Communication Communication: No difficulties   Cognition Arousal/Alertness: Awake/alert Behavior During Therapy: WFL for tasks assessed/performed Overall Cognitive Status: Within Functional Limits for tasks assessed                                     General Comments  vss  through out    Exercises     Shoulder Instructions      Home Living Family/patient expects to be discharged to:: Private residence Living Arrangements: Spouse/significant other Available Help at Discharge: Family;Available 24 hours/day Type of Home: House Home Access: Stairs to enter [employer] of Steps: 1 Entrance Stairs-Rails: None Home Layout: Two level;Able to live on main level with bedroom/bathroom Alternate Level Stairs-Number of Steps: flight Alternate Level Stairs-Rails: Left;Right Bathroom Shower/Tub: Occupational psychologist: Standard Bathroom Accessibility: Yes   Home Equipment: Shower seat - built in          Prior Functioning/Environment Level of Independence: Independent                 OT Problem List: Decreased knowledge of use of DME or AE;Decreased activity tolerance;Pain;Impaired balance (sitting and/or standing)      OT Treatment/Interventions:      OT Goals(Current goals can be found in the care plan section) Acute Rehab OT Goals Patient Stated Goal: go home, return to ind OT Goal Formulation: With patient Time For Goal Achievement: 12/06/19 Potential to Achieve Goals: Good  OT Frequency:     Barriers to D/C:            Co-evaluation PT/OT/SLP Co-Evaluation/Treatment: Yes Reason for Co-Treatment: To address functional/ADL transfers PT goals addressed during session: Mobility/safety with mobility OT goals addressed during session: ADL's and self-care      AM-PAC OT "6 Clicks" Daily Activity     Outcome Measure Help from another person eating meals?: A Little Help from another person taking care of personal grooming?: A Little Help from another person toileting, which includes using toliet, bedpan, or urinal?: A Little Help from another person bathing (including washing, rinsing, drying)?: A Little Help from another person to put on and taking off regular upper body clothing?: None Help from another person to  put on and taking off regular lower body clothing?: A Little 6 Click Score: 19   End of Session Equipment Utilized During Treatment: Gait belt Nurse Communication: Mobility status  Activity Tolerance: Patient tolerated treatment well Patient left: in bed;with call bell/phone within reach;with family/visitor present  OT Visit Diagnosis: Unsteadiness on feet (R26.81);Other abnormalities of gait and mobility (R26.89);Pain Pain - Right/Left: Right Pain - part of body: Leg                Time: [id number] OT Time Calculation (min): 23 min Charges:  OT General Charges $OT Visit: 1 Visit OT Evaluation $OT Eval Low Complexity: 1 Low  Zenovia Jarred, MSOT, OTR/L Behavioral Health OT/ Acute Relief OT [facility] Office: [place] 11/22/2019, 1:25 PM

## 2019-11-22 NOTE — Anesthesia Postprocedure Evaluation (Signed)
Anesthesia Post Note  Patient: Shannon [name]  Procedure(s) Performed: IR WITH ANESTHESIA/ STENT PLACEMENT (N/A )     Patient location during evaluation: PACU Anesthesia Type: General Level of consciousness: awake and alert Pain management: pain level controlled Vital Signs Assessment: post-procedure vital signs reviewed and stable Respiratory status: spontaneous breathing, nonlabored ventilation, respiratory function stable and patient connected to nasal cannula oxygen Cardiovascular status: stable (On cleviprex for BP control) Postop Assessment: no apparent nausea or vomiting Anesthetic complications: no    Last Vitals:  Vitals:   11/22/19 0630 11/22/19 0700  BP: (!) 124/50 (!) 126/54  Pulse: 78 75  Resp: 18 17  Temp:    SpO2: 96% 97%    Last Pain:  Vitals:   11/22/19 0400  TempSrc: Oral  PainSc: [place]

## 2019-11-22 NOTE — Discharge Summary (Signed)
Physician Discharge Summary      Patient ID: Shannon [name] MRN: [id number] DOB/AGE: 03-11-1946 73 y.o.  Admit date: 11/21/2019 Discharge date: 11/22/2019  Admission Diagnoses: Active Problems:   Middle cerebral artery stenosis, right  Discharge Diagnoses:  Active Problems:   Middle cerebral artery stenosis, right    Procedures: S/P RT common carotid arteriogram followed  by angioplasty and stenting of severe pre occlusive intrastent stenosis of RT MCA M1 seg.  Discharged Condition: good  Hospital Course: Admitted 12/9 after successful (R)MCA angioplasty with stent. POD #1 no complications. Heparin turned off. Pt tolerating diet, denies HA. Still feels a little weak. She is stable for discharge back home. We reviewed her medications as well as follow up plans.  Discharge Exam: Blood pressure (!) 129/50, pulse 79, temperature 98.8 F (37.1 C), temperature source Oral, resp. rate 19, height 5\' 5"  (1.651 m), weight 53.1 kg, SpO2 100 %. General: sitting up in bed eating. Lungs: CTA Heart: Regular Ext: (R)groin soft, NT, no hematoma. Palp pedal pulses. Neuro: Awake and alert No drift PERRLA, EOMI Face symmetric. Tongue midline no deviation Fine motor intact and equal Strength 5/5 and equal.  Disposition: Discharge disposition: 01-Home or Self Care      Discharge home Discharge Instructions    Call MD for:  difficulty breathing, headache or visual disturbances   Complete by: As directed    Call MD for:  persistant nausea and vomiting   Complete by: As directed    Call MD for:  redness, tenderness, or signs of infection (pain, swelling, redness, odor or green/yellow discharge around incision site)   Complete by: As directed    Call MD for:  severe uncontrolled pain   Complete by: As directed    Call MD for:  temperature >100.4   Complete by: As directed    Diet - low sodium heart healthy   Complete by: As directed    Increase activity slowly   Complete by: As  directed    May shower / Bathe   Complete by: As directed    Remove dressing in 24 hours   Complete by: As directed       Follow-up Information    Shannon Bras, MD. Go in 2 week(s).   Specialties: Interventional Radiology, Radiology Why: Office will call you to schedule Contact information: [place] [place], [place] [address] [phone]           Signed: Ascencion Dike PA-C 11/22/2019, 10:59 AM

## 2019-11-22 NOTE — Telephone Encounter (Signed)
Called to schedule 2 week f/u post stenting. Pt wanted me to let the PA and Dr. Estanislado Pandy know that she still has a left sided facial droop. She said that it was like this when she was in the hospital and she was told that it should go away. I told her that I would let them know right away. Pt agreed. AW

## 2019-11-22 NOTE — Consult Note (Signed)
Stroke Neurology Consultation Note  Consult Requested by: Wyman Songster, MD (Interventional Neuroradiologist)   Reason for Consult: Dr. Clydene [name] w/ hx [name] s/p elective angioplasty intrastent stenosis of R M1 11/21/2019  Consult Date:  11/22/19  History of Present Illness:  Shannon [name] is an 73 y.o. Caucasian female with PMH of stroke and TIA approximately 1 yr ago d/t R MCA M1 stenosis s/o revascularization R MCA M1 stenosis using stent assisted angioplasty 01/04/2019 by Dr Estanislado Pandy. Angio 10/30/2019 shows severe high-grade intra-stent preocclusive stenosis of R MCA M1. Admitted for elective angioplasty of severe pre occlusive intra-stent stenosis, now completed.  Mild L facial droop post IR. Patient has done well with the procedure except for mild facial droop which appears to be improving.  She denies any headache, dysarthria or focal extremity weakness or numbness.  IV heparin drip has been discontinued and blood pressure seems adequately controlled. Past Medical History:  Diagnosis Date  . Anxiety   . 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 Arthritis   . Cancer (HCC)    skin - squamous cell  . Complication of anesthesia    " I had difficulty coming out of it"  . Dyspnea    with exertion  . Family history of adverse reaction to anesthesia    " My daughter has a problem coming ou to it"  . GERD (gastroesophageal reflux disease)   . Headache    migraines in the past  . Hypertension   . Hypothyroidism   . Stroke ([place]) 11/2018   some weakness on right side   . Thyroid disease      Past Surgical History:  Procedure Laterality Date  . ABDOMINAL HYSTERECTOMY    . APPENDECTOMY    . CARDIAC CATHETERIZATION    . CARPAL TUNNEL RELEASE Bilateral   . COLONOSCOPY    . DILATION AND CURETTAGE OF UTERUS    . IR ANGIO INTRA EXTRACRAN SEL COM CAROTID INNOMINATE BILAT MOD SED  12/25/2018  . IR ANGIO INTRA EXTRACRAN SEL COM CAROTID INNOMINATE BILAT MOD SED  10/30/2019  . IR  ANGIO VERTEBRAL SEL SUBCLAVIAN INNOMINATE UNI L MOD SED  12/25/2018  . IR ANGIO VERTEBRAL SEL SUBCLAVIAN INNOMINATE UNI L MOD SED  10/30/2019  . IR ANGIO VERTEBRAL SEL SUBCLAVIAN INNOMINATE UNI R MOD SED  01/04/2019  . IR ANGIO VERTEBRAL SEL VERTEBRAL UNI R MOD SED  12/25/2018  . IR ANGIO VERTEBRAL SEL VERTEBRAL UNI R MOD SED  10/30/2019  . IR CT HEAD LTD  01/04/2019  . IR INTRA CRAN STENT  01/04/2019  . IR US GUIDE VASC ACCESS RIGHT  12/25/2018  . IR US GUIDE VASC ACCESS RIGHT  11/02/2019  . OVARIAN CYST REMOVAL    . RADIOLOGY WITH ANESTHESIA N/A 01/04/2019   Procedure: RMCA angioplasty with possible stenting;  Surgeon: Luanne Bras, MD;  Location: [facility];  Service: Radiology;  Laterality: N/A;    Family History  Problem Relation Age of Onset  . CVA Mother   . Heart disease Mother   . Peripheral Artery Disease Father   . CVA Maternal Aunt      Social History:  reports that she has never smoked. She has never used smokeless tobacco. She reports that she does not drink alcohol or use drugs.  Review of Systems: A full ROS was attempted today and was able to be performed.  Systems assessed include - Constitutional, Eyes, HENT, Respiratory, Cardiovascular, Gastrointestinal, Genitourinary, Integument/breast, Hematologic/lymphatic, Musculoskeletal, Neurological, Behavioral/Psych, Endocrine, Allergic/Immunologic -  with pertinent responses as per HPI.  Review of system was negative except for some facial droop only.  No other complaints.  Allergies:  Allergies  Allergen Reactions  . Sulfa Antibiotics Other (See Comments)    Tongue turned black  . Mandol [Cefamandole] Rash  . Sinutab Non-Drowsy Max [Pseudoephedrine-Acetaminophen] Palpitations     Medications: I have reviewed the patient's current medications.  Test Results: CBC:  Recent Labs  Lab 11/21/19 0635 11/22/19 0500  WBC 3.7* 6.2  NEUTROABS 2.0 4.4  HGB 13.3 11.1*  HCT 39.9 32.5*  MCV 93.2 93.4  PLT 187 [id number]   Basic  Metabolic Panel:  Recent Labs  Lab 11/21/19 0635 11/22/19 0500  NA 141 137  K 4.0 3.0*  CL 105 108  CO2 25 21*  GLUCOSE 89 89  BUN 15 6*  CREATININE 0.74 0.59  CALCIUM 9.9 8.0*   Coagulation Studies:  Recent Labs    11/21/19 0635  LABPROT 12.5  INR 0.9   Urinalysis:  Recent Labs  Lab 11/21/19 0713  COLORURINE YELLOW  LABSPEC 1.013  PHURINE 5.0  GLUCOSEU NEGATIVE  HGBUR NEGATIVE  BILIRUBINUR NEGATIVE  KETONESUR NEGATIVE  PROTEINUR NEGATIVE  NITRITE NEGATIVE  LEUKOCYTESUR NEGATIVE   Microbiology:  Results for orders placed or performed during the hospital encounter of 11/21/19  MRSA PCR Screening     Status: Shannon [name]   Collection Time: 11/21/19  5:49 PM   Specimen: Nasal Mucosa; Nasopharyngeal  Result Value Ref Range Status   MRSA by PCR NEGATIVE NEGATIVE Final    Comment:        The GeneXpert MRSA Assay (FDA approved for NASAL specimens only), is one component of a comprehensive MRSA colonization surveillance program. It is not intended to diagnose MRSA infection nor to guide or monitor treatment for MRSA infections. Performed at [facility], [place] [address], [place], [place] [address]     IR US Guide Vasc Access Right  Result Date: 11/02/2019 CLINICAL DATA:  Right middle cerebral artery intra stent stenosis on CT angiogram of the head and neck. Vertebrobasilar insufficiency symptoms.  EXAM: BILATERAL COMMON CAROTID AND INNOMINATE ANGIOGRAPHY, and bilateral vertebral artery angiograms.  COMPARISON:  CT angiogram of the head and neck of October 15, 2019, and also arteriogram of January 04, 2019.  MEDICATIONS: Heparin 2000 units IA; no antibiotic was administered within 1 hour of the procedure.  ANESTHESIA/SEDATION: Versed 1 mg IV; Fentanyl 25 mcg IV  Moderate Sedation Time:  38 minutes  The patient was continuously monitored during the procedure by the interventional radiology nurse under my direct supervision.  CONTRAST:  Isovue 300 approximately 60  mL.  FLUOROSCOPY TIME:  Fluoroscopy Time: 6 minutes 30 seconds (592 mGy).  COMPLICATIONS: Shannon [name] immediate.  TECHNIQUE: Informed written consent was obtained from the patient after a thorough discussion of the procedural risks, benefits and alternatives. All questions were addressed. Maximal Sterile Barrier Technique was utilized including caps, mask, sterile gowns, sterile gloves, sterile drape, hand hygiene and skin antiseptic. A timeout was performed prior to the initiation of the procedure.  The right forearm to the wrist was prepped and draped in the usual sterile fashion.  The right radial artery was palpated. A dorsal palmar anastomosis was then verified. Ultrasound was utilized to document the morphology of the right radial artery.  Using ultrasound guidance, trans radial access in the right radial artery was obtained with a micropuncture set followed by advancement of a 4/5 French radial sheath over a 0.018 inch micro guidewire. The  obturator and the wire were removed. Good aspiration was obtained from the side port of the sheath. A cocktail of 2000 units of heparin, 2.5 mg of verapamil, and 200 mcg of nitroglycerin was then injected into the radial sheath in diluted form without event.  A right radial angiogram was then obtained.  Over a 0.035 inch Roadrunner guidewire, a 5 [place] Simmons 2 diagnostic catheter was then advanced to the aortic arch region and selectively positioned in the right vertebral artery, the right common carotid artery, the left common carotid artery and the left subclavian artery.  Following the procedure, hemostasis was achieved with a wrist band in the right radial region. Distal right radial pulse was verified to be present.  FINDINGS: Approximately 50% stenosis at the origin of the right vertebral artery is seen.  The vessel, otherwise, opacifies to the cranial skull base. Wide patency is seen of the right posterior-inferior cerebellar artery with mild narrowing of the  right vertebrobasilar junction.  Flow is seen into the basilar artery, the posterior cerebral arteries, the superior cerebellar arteries and the anterior-inferior cerebellar arteries.  There is suggestion of a moderate stenosis just proximal to the origin of the right anterior-inferior cerebellar artery with in flow from the contralateral unopacified blood from the left vertebral artery.  Also seen is an approximately 50-75% stenosis of the right P1 segment and to a lesser degree of the left P1 segment.  A right common carotid arteriogram demonstrates the right external carotid artery and its major branches to be widely patent.  The right internal carotid artery at the bulb to the cranial skull base demonstrates wide patency.  The petrous segment is widely patent.  There is mild fusiform dilatation of the caval cavernous segment.  The supraclinoid segment is widely patent.  The right middle cerebral artery demonstrates severe stenosis within the previously positioned stent from the proximal 1/3 of the middle cerebral artery to the proximal M2 inferior division.  Also reduced hemodynamic flow in the right and right MCA distribution is seen,  The right anterior cerebral artery opacifies into the capillary and venous phases. Cross-filling via the anterior communicating artery of the left anterior cerebral A2 segment and distally is seen,  A left subclavian arteriogram demonstrates severe high-grade stenosis at the origin of the left vertebral artery.  There is slow ascent of contrast to the cranial skull base with approximately 40% stenosis at the level of C3.  More distally the left vertebrobasilar junction is widely patent.  The opacified portion of the basilar artery, the posterior cerebral arteries, the superior cerebellar arteries and the anterior-inferior cerebellar arteries are patent. Unopacified blood is seen in the basilar artery from the contralateral right vertebral artery.  The left common  carotid arteriogram demonstrates the left external carotid artery and its major branches to be widely patent.  The left internal carotid artery has a smooth shallow plaque along the lateral posterior wall without evidence of significant stenosis.  More distally the left internal carotid artery is seen to opacify to the cranial skull base. The petrous and cavernous segments are widely patent.  There is mild diffuse narrowing of the left internal carotid supraclinoid segment. A 50% narrowing is seen of the proximal right anterior cerebral A1 segment, and of the left middle cerebral artery M1 segment.  Distal perfusion in both these vessels appears to be unremarkable.  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  Severe high-grade stenosis at the origin of the  left vertebral artery, with a 10 - 50% stenosis of the mid cervical left vertebral artery.  Approximately 50% stenosis of the left middle cerebral M1 segment, and of the left anterior cerebral A1 segment.  50-75% stenosis of the right posterior cerebral artery P1 segment, and 50% stenosis of the left posterior cerebral artery P1 segment.  Approximately 50% stenosis of the right vertebrobasilar junction just proximal to the basilar artery.  PLAN: Findings reviewed with patient and the patient's husband. Angiographically the patient demonstrates a severe interval development of intra stent stenosis of the right middle cerebral artery, and also of the left vertebral artery at its origin. This despite the patient taking aspirin 81 mg a day, and Brilinta 90 mg a day.  Option of endovascular revascularization of the right middle cerebral artery and probably the left vertebral artery at a latter time was reviewed. The patient does complain of vague symptoms of a lack of energy, and lack of concentration.  Following discussion with the husband and the patient, they expressed the desire to proceed with a  revascularization of the severe intra stent right MCA stenosis.  This will be scheduled at the earliest possible.  In the meantime, the patient has been asked to maintain adequate hydration.  They were asked to call 911 should her symptoms worsen.   Electronically Signed   By: Luanne Bras M.D.   On: 10/30/2019 10:51   IR ANGIO INTRA EXTRACRAN SEL COM CAROTID INNOMINATE BILAT MOD SED  Result Date: 11/02/2019 CLINICAL DATA:  Right middle cerebral artery intra stent stenosis on CT angiogram of the head and neck. Vertebrobasilar insufficiency symptoms. EXAM: BILATERAL COMMON CAROTID AND INNOMINATE ANGIOGRAPHY, and bilateral vertebral artery angiograms. COMPARISON:  CT angiogram of the head and neck of October 15, 2019, and also arteriogram of January 04, 2019. MEDICATIONS: Heparin 2000 units IA; no antibiotic was administered within 1 hour of the procedure. ANESTHESIA/SEDATION: Versed 1 mg IV; Fentanyl 25 mcg IV Moderate Sedation Time:  38 minutes The patient was continuously monitored during the procedure by the interventional radiology nurse under my direct supervision. CONTRAST:  Isovue 300 approximately 60 mL. FLUOROSCOPY TIME:  Fluoroscopy Time: 6 minutes 30 seconds (592 mGy). COMPLICATIONS: Shannon [name] immediate. TECHNIQUE: Informed written consent was obtained from the patient after a thorough discussion of the procedural risks, benefits and alternatives. All questions were addressed. Maximal Sterile Barrier Technique was utilized including caps, mask, sterile gowns, sterile gloves, sterile drape, hand hygiene and skin antiseptic. A timeout was performed prior to the initiation of the procedure. The right forearm to the wrist was prepped and draped in the usual sterile fashion. The right radial artery was palpated. A dorsal palmar anastomosis was then verified. Ultrasound was utilized to document the morphology of the right radial artery. Using ultrasound guidance, trans radial access in the right  radial artery was obtained with a micropuncture set followed by advancement of a 4/5 French radial sheath over a 0.018 inch micro guidewire. The obturator and the wire were removed. Good aspiration was obtained from the side port of the sheath. A cocktail of 2000 units of heparin, 2.5 mg of verapamil, and 200 mcg of nitroglycerin was then injected into the radial sheath in diluted form without event. A right radial angiogram was then obtained. Over a 0.035 inch Roadrunner guidewire, a 5 [place] Simmons 2 diagnostic catheter was then advanced to the aortic arch region and selectively positioned in the right vertebral artery, the right common carotid artery, the left common carotid artery and  the left subclavian artery. Following the procedure, hemostasis was achieved with a wrist band in the right radial region. Distal right radial pulse was verified to be present. FINDINGS: Approximately 50% stenosis at the origin of the right vertebral artery is seen. The vessel, otherwise, opacifies to the cranial skull base. Wide patency is seen of the right posterior-inferior cerebellar artery with mild narrowing of the right vertebrobasilar junction. Flow is seen into the basilar artery, the posterior cerebral arteries, the superior cerebellar arteries and the anterior-inferior cerebellar arteries. There is suggestion of a moderate stenosis just proximal to the origin of the right anterior-inferior cerebellar artery with in flow from the contralateral unopacified blood from the left vertebral artery. Also seen is an approximately 50-75% stenosis of the right P1 segment and to a lesser degree of the left P1 segment. A right common carotid arteriogram demonstrates the right external carotid artery and its major branches to be widely patent. The right internal carotid artery at the bulb to the cranial skull base demonstrates wide patency. The petrous segment is widely patent. There is mild fusiform dilatation of the caval cavernous  segment. The supraclinoid segment is widely patent. The right middle cerebral artery demonstrates severe stenosis within the previously positioned stent from the proximal 1/3 of the middle cerebral artery to the proximal M2 inferior division. Also reduced hemodynamic flow in the right and right MCA distribution is seen, The right anterior cerebral artery opacifies into the capillary and venous phases. Cross-filling via the anterior communicating artery of the left anterior cerebral A2 segment and distally is seen, A left subclavian arteriogram demonstrates severe high-grade stenosis at the origin of the left vertebral artery. There is slow ascent of contrast to the cranial skull base with approximately 40% stenosis at the level of C3. More distally the left vertebrobasilar junction is widely patent. The opacified portion of the basilar artery, the posterior cerebral arteries, the superior cerebellar arteries and the anterior-inferior cerebellar arteries are patent. Unopacified blood is seen in the basilar artery from the contralateral right vertebral artery. The left common carotid arteriogram demonstrates the left external carotid artery and its major branches to be widely patent. The left internal carotid artery has a smooth shallow plaque along the lateral posterior wall without evidence of significant stenosis. More distally the left internal carotid artery is seen to opacify to the cranial skull base. The petrous and cavernous segments are widely patent. There is mild diffuse narrowing of the left internal carotid supraclinoid segment. A 50% narrowing is seen of the proximal right anterior cerebral A1 segment, and of the left middle cerebral artery M1 segment. Distal perfusion in both these vessels appears to be unremarkable. 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 Severe high-grade stenosis at the origin of the left vertebral artery, with a 10 - 50%  stenosis of the mid cervical left vertebral artery. Approximately 50% stenosis of the left middle cerebral M1 segment, and of the left anterior cerebral A1 segment. 50-75% stenosis of the right posterior cerebral artery P1 segment, and 50% stenosis of the left posterior cerebral artery P1 segment. Approximately 50% stenosis of the right vertebrobasilar junction just proximal to the basilar artery. PLAN: Findings reviewed with patient and the patient's husband. Angiographically the patient demonstrates a severe interval development of intra stent stenosis of the right middle cerebral artery, and also of the left vertebral artery at its origin. This despite the patient taking aspirin 81 mg a day, and Brilinta 90  mg a day. Option of endovascular revascularization of the right middle cerebral artery and probably the left vertebral artery at a latter time was reviewed. The patient does complain of vague symptoms of a lack of energy, and lack of concentration. Following discussion with the husband and the patient, they expressed the desire to proceed with a revascularization of the severe intra stent right MCA stenosis. This will be scheduled at the earliest possible. In the meantime, the patient has been asked to maintain adequate hydration. They were asked to call 911 should her symptoms worsen. Electronically Signed   By: Luanne Bras M.D.   On: 10/30/2019 10:51   IR ANGIO VERTEBRAL SEL SUBCLAVIAN INNOMINATE UNI L MOD SED  Result Date: 10/30/2019 CLINICAL DATA:  Right middle cerebral artery intra stent stenosis on CT angiogram of the head and neck. Vertebrobasilar insufficiency symptoms.  EXAM: BILATERAL COMMON CAROTID AND INNOMINATE ANGIOGRAPHY, and bilateral vertebral artery angiograms.  COMPARISON:  CT angiogram of the head and neck of October 15, 2019, and also arteriogram of January 04, 2019.  MEDICATIONS: Heparin 2000 units IA; no antibiotic was administered within 1 hour of the procedure.   ANESTHESIA/SEDATION: Versed 1 mg IV; Fentanyl 25 mcg IV  Moderate Sedation Time:  38 minutes  The patient was continuously monitored during the procedure by the interventional radiology nurse under my direct supervision.  CONTRAST:  Isovue 300 approximately 60 mL.  FLUOROSCOPY TIME:  Fluoroscopy Time: 6 minutes 30 seconds (592 mGy).  COMPLICATIONS: Shannon [name] immediate.  TECHNIQUE: Informed written consent was obtained from the patient after a thorough discussion of the procedural risks, benefits and alternatives. All questions were addressed. Maximal Sterile Barrier Technique was utilized including caps, mask, sterile gowns, sterile gloves, sterile drape, hand hygiene and skin antiseptic. A timeout was performed prior to the initiation of the procedure.  The right forearm to the wrist was prepped and draped in the usual sterile fashion.  The right radial artery was palpated. A dorsal palmar anastomosis was then verified. Ultrasound was utilized to document the morphology of the right radial artery.  Using ultrasound guidance, trans radial access in the right radial artery was obtained with a micropuncture set followed by advancement of a 4/5 French radial sheath over a 0.018 inch micro guidewire. The obturator and the wire were removed. Good aspiration was obtained from the side port of the sheath. A cocktail of 2000 units of heparin, 2.5 mg of verapamil, and 200 mcg of nitroglycerin was then injected into the radial sheath in diluted form without event.  A right radial angiogram was then obtained.  Over a 0.035 inch Roadrunner guidewire, a 5 [place] Simmons 2 diagnostic catheter was then advanced to the aortic arch region and selectively positioned in the right vertebral artery, the right common carotid artery, the left common carotid artery and the left subclavian artery.  Following the procedure, hemostasis was achieved with a wrist band in the right radial region. Distal right radial pulse was verified to  be present.  FINDINGS: Approximately 50% stenosis at the origin of the right vertebral artery is seen.  The vessel, otherwise, opacifies to the cranial skull base. Wide patency is seen of the right posterior-inferior cerebellar artery with mild narrowing of the right vertebrobasilar junction.  Flow is seen into the basilar artery, the posterior cerebral arteries, the superior cerebellar arteries and the anterior-inferior cerebellar arteries.  There is suggestion of a moderate stenosis just proximal to the origin of the right anterior-inferior cerebellar artery with in flow from  the contralateral unopacified blood from the left vertebral artery.  Also seen is an approximately 50-75% stenosis of the right P1 segment and to a lesser degree of the left P1 segment.  A right common carotid arteriogram demonstrates the right external carotid artery and its major branches to be widely patent.  The right internal carotid artery at the bulb to the cranial skull base demonstrates wide patency.  The petrous segment is widely patent.  There is mild fusiform dilatation of the caval cavernous segment.  The supraclinoid segment is widely patent.  The right middle cerebral artery demonstrates severe stenosis within the previously positioned stent from the proximal 1/3 of the middle cerebral artery to the proximal M2 inferior division.  Also reduced hemodynamic flow in the right and right MCA distribution is seen,  The right anterior cerebral artery opacifies into the capillary and venous phases. Cross-filling via the anterior communicating artery of the left anterior cerebral A2 segment and distally is seen,  A left subclavian arteriogram demonstrates severe high-grade stenosis at the origin of the left vertebral artery.  There is slow ascent of contrast to the cranial skull base with approximately 40% stenosis at the level of C3.  More distally the left vertebrobasilar junction is widely patent.  The opacified  portion of the basilar artery, the posterior cerebral arteries, the superior cerebellar arteries and the anterior-inferior cerebellar arteries are patent. Unopacified blood is seen in the basilar artery from the contralateral right vertebral artery.  The left common carotid arteriogram demonstrates the left external carotid artery and its major branches to be widely patent.  The left internal carotid artery has a smooth shallow plaque along the lateral posterior wall without evidence of significant stenosis.  More distally the left internal carotid artery is seen to opacify to the cranial skull base. The petrous and cavernous segments are widely patent.  There is mild diffuse narrowing of the left internal carotid supraclinoid segment. A 50% narrowing is seen of the proximal right anterior cerebral A1 segment, and of the left middle cerebral artery M1 segment.  Distal perfusion in both these vessels appears to be unremarkable.  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  Severe high-grade stenosis at the origin of the left vertebral artery, with a 10 - 50% stenosis of the mid cervical left vertebral artery.  Approximately 50% stenosis of the left middle cerebral M1 segment, and of the left anterior cerebral A1 segment.  50-75% stenosis of the right posterior cerebral artery P1 segment, and 50% stenosis of the left posterior cerebral artery P1 segment.  Approximately 50% stenosis of the right vertebrobasilar junction just proximal to the basilar artery.  PLAN: Findings reviewed with patient and the patient's husband. Angiographically the patient demonstrates a severe interval development of intra stent stenosis of the right middle cerebral artery, and also of the left vertebral artery at its origin. This despite the patient taking aspirin 81 mg a day, and Brilinta 90 mg a day.  Option of endovascular revascularization of the right middle cerebral  artery and probably the left vertebral artery at a latter time was reviewed. The patient does complain of vague symptoms of a lack of energy, and lack of concentration.  Following discussion with the husband and the patient, they expressed the desire to proceed with a revascularization of the severe intra stent right MCA stenosis.  This will be scheduled at the earliest possible.  In the meantime, the patient has been asked to  maintain adequate hydration.  They were asked to call 911 should her symptoms worsen.   Electronically Signed   By: Luanne Bras M.D.   On: 10/30/2019 10:51   IR ANGIO VERTEBRAL SEL VERTEBRAL UNI R MOD SED  Result Date: 10/30/2019 CLINICAL DATA:  Right middle cerebral artery intra stent stenosis on CT angiogram of the head and neck. Vertebrobasilar insufficiency symptoms.  EXAM: BILATERAL COMMON CAROTID AND INNOMINATE ANGIOGRAPHY, and bilateral vertebral artery angiograms.  COMPARISON:  CT angiogram of the head and neck of October 15, 2019, and also arteriogram of January 04, 2019.  MEDICATIONS: Heparin 2000 units IA; no antibiotic was administered within 1 hour of the procedure.  ANESTHESIA/SEDATION: Versed 1 mg IV; Fentanyl 25 mcg IV  Moderate Sedation Time:  38 minutes  The patient was continuously monitored during the procedure by the interventional radiology nurse under my direct supervision.  CONTRAST:  Isovue 300 approximately 60 mL.  FLUOROSCOPY TIME:  Fluoroscopy Time: 6 minutes 30 seconds (592 mGy).  COMPLICATIONS: Shannon [name] immediate.  TECHNIQUE: Informed written consent was obtained from the patient after a thorough discussion of the procedural risks, benefits and alternatives. All questions were addressed. Maximal Sterile Barrier Technique was utilized including caps, mask, sterile gowns, sterile gloves, sterile drape, hand hygiene and skin antiseptic. A timeout was performed prior to the initiation of the procedure.  The right forearm to the wrist was  prepped and draped in the usual sterile fashion.  The right radial artery was palpated. A dorsal palmar anastomosis was then verified. Ultrasound was utilized to document the morphology of the right radial artery.  Using ultrasound guidance, trans radial access in the right radial artery was obtained with a micropuncture set followed by advancement of a 4/5 French radial sheath over a 0.018 inch micro guidewire. The obturator and the wire were removed. Good aspiration was obtained from the side port of the sheath. A cocktail of 2000 units of heparin, 2.5 mg of verapamil, and 200 mcg of nitroglycerin was then injected into the radial sheath in diluted form without event.  A right radial angiogram was then obtained.  Over a 0.035 inch Roadrunner guidewire, a 5 [place] Simmons 2 diagnostic catheter was then advanced to the aortic arch region and selectively positioned in the right vertebral artery, the right common carotid artery, the left common carotid artery and the left subclavian artery.  Following the procedure, hemostasis was achieved with a wrist band in the right radial region. Distal right radial pulse was verified to be present.  FINDINGS: Approximately 50% stenosis at the origin of the right vertebral artery is seen.  The vessel, otherwise, opacifies to the cranial skull base. Wide patency is seen of the right posterior-inferior cerebellar artery with mild narrowing of the right vertebrobasilar junction.  Flow is seen into the basilar artery, the posterior cerebral arteries, the superior cerebellar arteries and the anterior-inferior cerebellar arteries.  There is suggestion of a moderate stenosis just proximal to the origin of the right anterior-inferior cerebellar artery with in flow from the contralateral unopacified blood from the left vertebral artery.  Also seen is an approximately 50-75% stenosis of the right P1 segment and to a lesser degree of the left P1 segment.  A right common carotid  arteriogram demonstrates the right external carotid artery and its major branches to be widely patent.  The right internal carotid artery at the bulb to the cranial skull base demonstrates wide patency.  The petrous segment is widely patent.  There is mild fusiform  dilatation of the caval cavernous segment.  The supraclinoid segment is widely patent.  The right middle cerebral artery demonstrates severe stenosis within the previously positioned stent from the proximal 1/3 of the middle cerebral artery to the proximal M2 inferior division.  Also reduced hemodynamic flow in the right and right MCA distribution is seen,  The right anterior cerebral artery opacifies into the capillary and venous phases. Cross-filling via the anterior communicating artery of the left anterior cerebral A2 segment and distally is seen,  A left subclavian arteriogram demonstrates severe high-grade stenosis at the origin of the left vertebral artery.  There is slow ascent of contrast to the cranial skull base with approximately 40% stenosis at the level of C3.  More distally the left vertebrobasilar junction is widely patent.  The opacified portion of the basilar artery, the posterior cerebral arteries, the superior cerebellar arteries and the anterior-inferior cerebellar arteries are patent. Unopacified blood is seen in the basilar artery from the contralateral right vertebral artery.  The left common carotid arteriogram demonstrates the left external carotid artery and its major branches to be widely patent.  The left internal carotid artery has a smooth shallow plaque along the lateral posterior wall without evidence of significant stenosis.  More distally the left internal carotid artery is seen to opacify to the cranial skull base. The petrous and cavernous segments are widely patent.  There is mild diffuse narrowing of the left internal carotid supraclinoid segment. A 50% narrowing is seen of the proximal right anterior  cerebral A1 segment, and of the left middle cerebral artery M1 segment.  Distal perfusion in both these vessels appears to be unremarkable.  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  Severe high-grade stenosis at the origin of the left vertebral artery, with a 10 - 50% stenosis of the mid cervical left vertebral artery.  Approximately 50% stenosis of the left middle cerebral M1 segment, and of the left anterior cerebral A1 segment.  50-75% stenosis of the right posterior cerebral artery P1 segment, and 50% stenosis of the left posterior cerebral artery P1 segment.  Approximately 50% stenosis of the right vertebrobasilar junction just proximal to the basilar artery.  PLAN: Findings reviewed with patient and the patient's husband. Angiographically the patient demonstrates a severe interval development of intra stent stenosis of the right middle cerebral artery, and also of the left vertebral artery at its origin. This despite the patient taking aspirin 81 mg a day, and Brilinta 90 mg a day.  Option of endovascular revascularization of the right middle cerebral artery and probably the left vertebral artery at a latter time was reviewed. The patient does complain of vague symptoms of a lack of energy, and lack of concentration.  Following discussion with the husband and the patient, they expressed the desire to proceed with a revascularization of the severe intra stent right MCA stenosis.  This will be scheduled at the earliest possible.  In the meantime, the patient has been asked to maintain adequate hydration.  They were asked to call 911 should her symptoms worsen.   Electronically Signed   By: Luanne Bras M.D.   On: 10/30/2019 10:51    Cerebral angio 11/21/2019 RT common carotid arteriogram followed  by angioplasty and stenting of severe pre occlusive intrastent stenosis  Of RT MCA M1 seg.   Physical Examination: Temp:  [97.7 F  (36.5 C)-99 F (37.2 C)] 98.8 F (37.1 C) (12/10 0742) Pulse Rate:  [73-94]  80 (12/10 0800) Resp:  [14-22] 20 (12/10 0800) BP: (114-156)/(45-134) 129/54 (12/10 0800) SpO2:  [93 %-100 %] 96 % (12/10 0800) Arterial Line BP: (113-159)/(39-64) 132/53 (12/10 0800) Physical Exam General: Frail elderly Caucasian lady, seated, in no evident distress Head: head normocephalic and atraumatic.   Neck: supple with no carotid or supraclavicular bruits Cardiovascular: regular rate and rhythm, no murmurs Musculoskeletal: no deformity Skin:  no rash/petichiae Vascular:  Normal pulses all extremities  Neurologic Exam Mental Status: Awake and fully alert. Oriented to place and time. Recent and remote memory intact. Attention span, concentration and fund of knowledge appropriate. Mood and affect appropriate.  Cranial Nerves: Fundoscopic exam reveals sharp disc margins. Pupils equal, briskly reactive to light. Extraocular movements full without nystagmus. Visual fields full to confrontation. Hearing intact. Facial sensation intact.  Mild left lower facial asymmetry., tongue, palate moves normally and symmetrically.  Motor: Normal bulk and tone. Normal strength in all tested extremity muscles.  Diminished fine finger movements on the left.  Orbits right over left upper extremity.  Minimum left grip weakness. Sensory.: intact to touch , pinprick , position and vibratory sensation.  Coordination: Rapid alternating movements normal in all extremities. Finger-to-nose and heel-to-shin performed accurately bilaterally.  Positive Fukuda test foot patient moving off base and rotating to the right Gait and Station: Deferred Reflexes: 1+ and symmetric. Toes downgoing.   Assessment:  Shannon [name] is a 73 y.o. female with history of stroke and TIA d/t R MCA M1 stenosis s/o revascularization R MCA M1 stenosis using stent assisted angioplasty 01/04/2019 by Dr Estanislado Pandy. Angio 10/30/2019 shows severe high-grade intra-stent  preocclusive stenosis of R MCA M1. Admitted for elective angioplasty of severe pre occlusive intra-stent stenosis.   R MCA stenosis s/p angioplasty of R MCA M1 intra-stent stenosis,  post IR L facial droop is improving  Cerebral angio angioplasty and stenting of severe pre occlusive intra-stent stenosis R MCA M1  On IV heparin until early this am  P2Y12 0->8->8->62  aspirin 81 mg daily and Brilinta (ticagrelor) 90 mg bid prior to admission, now on aspirin 81 mg daily and Brilinta (ticagrelor) 45 mg bid.   PT and OT to see for any recommendations   Anticipate d/c later today  History of [name] 12/24/2018  TIA - due to right MCA high grade stenosis in the setting of hypotension 12/08/2018   12/08/18 -admitted for slurred speech and generalized weakness, dizziness.  CT showed right basal ganglia infarct  MRI showed right BG/CR infarct  MRI showed right M1 high-grade stenosis  Carotid Doppler unremarkable  2D Echo EF 60 to 65%  LDL 132  HgbA1c 5.4  Put on ASA 81 and plavix 75 on discharge  Pt no significant stroke risk factors, with isolated right M1 high grade stenosis, concerning for cardioembolic source, agree with EP and recommend 30 day cardiac event monitoring as outpt to rule out afib.    Hypertension  Stable  Hyperlipidemia  Home meds:  lipitor 40, resumed in hospital  Continue statin at discharge  Other Stroke Risk Factors  Advanced age  Hx [name] (see above)  Hospital day # 1  Patient presented for elective right middle cerebral artery angioplasty stenting which was performed successfully.  She has mild worsening of her pre-existing left facial droop which also appears to be improving.  I do not believe any further neuroimaging studies indicated.  Continue dual antiplatelet therapy and aggressive risk factor modification.  Mobilize out of bed.  Therapy consults.  Likely discharge home later  today.  Follow-up as an outpatient in the stroke clinic  as already scheduled with Franky Macho nurse practitioner.  Greater than 50% time during this 55-minute consultation visit was spent on counseling and coordination of care about intracranial stenosis and angioplasty stenting and discussion about stroke risk factor modification and answering questions Thank you for this consultation and allowing [place] to participate in the care of this patient.  Antony Contras, MD    To contact Stroke Continuity provider, please refer to [email or web address] After hours, contact General Neurology

## 2019-11-22 NOTE — Progress Notes (Signed)
[facility] for Heparin Indication: Post-Interventional Radiology  Allergies  Allergen Reactions  . Sulfa Antibiotics Other (See Comments)    Tongue turned black  . Mandol [Cefamandole] Rash  . Sinutab Non-Drowsy Max [Pseudoephedrine-Acetaminophen] Palpitations    Patient Measurements: Height: 5\' 5"  (165.1 cm) Weight: 117 lb (53.1 kg) IBW/kg (Calculated) : 57 Heparin Dosing Weight: 53.1 kg  Vital Signs: Temp: 98.4 F (36.9 C) (12/10 0000) Temp Source: Oral (12/10 0000) BP: 121/51 (12/09 2230) Pulse Rate: 73 (12/09 2230)  Labs: Recent Labs    11/21/19 0635 11/21/19 1400 11/22/19 0125  HGB 13.3  --   --   HCT 39.9  --   --   PLT 187  --   --   APTT 30 >200*  --   LABPROT 12.5  --   --   INR 0.9  --   --   HEPARINUNFRC  --   --  0.18*  CREATININE 0.74  --   --     Estimated Creatinine Clearance: 52.5 mL/min (by C-G formula based on SCr of 0.74 mg/dL).   Medical History: Past Medical History:  Diagnosis Date  . Anxiety   . 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 Arthritis   . Cancer (HCC)    skin - squamous cell  . Complication of anesthesia    " I had difficulty coming out of it"  . Dyspnea    with exertion  . Family history of adverse reaction to anesthesia    " My daughter has a problem coming ou to it"  . GERD (gastroesophageal reflux disease)   . Headache    migraines in the past  . Hypertension   . Hypothyroidism   . Stroke ([place]) 11/2018   some weakness on right side   . 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  She underwent diagnostic angiogram 10/30/19 which showed high-grade intra-stent stenosis. Today, pt is S/P right common carotid arteriogram followed by angioplasty and stenting of severe pre-occlusive intrastent stenosis of right MCA M1 seg. Pt was on no anticoagulants prior to admission. Pharmacy  is consulted to dose heparin after the procedure; heparin is scheduled to stop at 8 AM tomorrow morning.  Per PACU RN, Dr. Estanislado Pandy instructed her to restart heparin at 16:15 PM this afternoon (ACT 186, aPTT >200).  Heparin level therapeutic at 0.18 per post-IR goals  Goal of Therapy:  Heparin level 0.1-0.25 units/ml Monitor platelets by anticoagulation protocol: Yes   Plan:  Continue heparin gtt at 450 units/hr Scheduled to stop @0800 , f/u IR plans  Bertis Ruddy, PharmD Clinical Pharmacist Please check AMION for all [facility] [place] numbers 11/22/2019 3:01 AM

## 2019-11-26 ENCOUNTER — Telehealth: Payer: Self-pay | Admitting: Student

## 2019-11-26 ENCOUNTER — Other Ambulatory Visit (HOSPITAL_COMMUNITY): Payer: Self-pay | Admitting: Interventional Radiology

## 2019-11-26 ENCOUNTER — Telehealth (HOSPITAL_COMMUNITY): Payer: Self-pay

## 2019-11-26 DIAGNOSIS — R519 Headache, unspecified: Secondary | ICD-10-CM

## 2019-11-26 DIAGNOSIS — I771 Stricture of artery: Secondary | ICD-10-CM

## 2019-11-26 DIAGNOSIS — R200 Anesthesia of skin: Secondary | ICD-10-CM

## 2019-11-26 NOTE — Telephone Encounter (Signed)
NIR.  Patient underwent an image-guided cerebral arteriogram with revascularization of right MCA M1 segment severe pre-occlusive intra-stent stenosis using stenting and angioplasty via right femoral approach 11/21/2019 by Dr. Estanislado Pandy.  Received message from patient regarding symptoms requesting that I call her.  Called patient's home phone at 1202. Patient had multiple questions for me, as below: 1- Patient states that she developed a cough since her procedure 11/21/2019. States that she would like to take Delsym OTC for this, but states she was told by Dr. Estanislado Pandy not to take any medications without his permission. 2- States that "the thing that hangs down in the back of my mouth" (?uvula) is red and swollen. Husband states no active bleeding at this time. Patient wants to ensure that this is normal. 3- Complains of daily intermittent headaches. States headaches are located on the right side of her head near her temple. Describes headaches as "achey". Rates headaches as 5-6/10. States she takes OTC Tylenol with relief of headaches. Denies N/V, dizziness, or vision changes associated with headaches. 4- Complains of intermittent numbness/tingling of left side of lip and gums. States this occurred yesterday multiple times, lasting a few seconds each time. States that it has not occurred today. States taste and speech are normal.  Discussed above with Dr. Estanislado Pandy who states: [place] for patient to take Delsym. 2- Probable edema/erythema from intubation, no need to be concerned unless active bleeding, will re-assess at 2 week F/U. 3- Probable normal headaches related to inflammation changes from procedure. 4- Not concerned since hasn't occurred since and only lasted a few seconds. Recommend patient F/U in 2 weeks as planned.  Called patient at 66 and discussed above. Patient states that since she last spoke with me, she had a 10 minute episode of numbness/tingling of left side of lips/gums. Again  discussed case with Dr. Estanislado Pandy who recommends MRI/MRA brain/head (without contrast) ASAP for further evaluation- message sent to schedulers to facilitate this. Informed patient that our schedulers will call her to set up this imaging scan. Instructed patient to stay hydrated by drinking plenty of water. Per Dr. Estanislado Pandy- instructed patient to discontinue taking Brilinta 45 mg twice daily and begin taking Brilinta 90 mg twice daily (continue taking Aspirin 81 mg once daily). All questions answered and concerns addressed. Patient conveys understanding and agrees with plan.   Bea Graff Aamir Mclinden, PA-C 11/26/2019, 2:06 PM

## 2019-11-26 NOTE — Telephone Encounter (Signed)
Pt called with questions regarding her recent procedure and new sx. I have sent a message to our PA to review and will call patient back. AW

## 2019-11-27 ENCOUNTER — Telehealth (HOSPITAL_COMMUNITY): Payer: Self-pay

## 2019-11-27 ENCOUNTER — Other Ambulatory Visit: Payer: Self-pay

## 2019-11-27 ENCOUNTER — Ambulatory Visit (HOSPITAL_COMMUNITY)
Admission: RE | Admit: 2019-11-27 | Discharge: 2019-11-27 | Disposition: A | Discharge status: Home / Self Care | Payer: Medicare Other | Source: Ambulatory Visit | Attending: Interventional Radiology | Admitting: Interventional Radiology

## 2019-11-27 DIAGNOSIS — I63411 Cerebral infarction due to embolism of right middle cerebral artery: Secondary | ICD-10-CM | POA: Diagnosis not present

## 2019-11-27 DIAGNOSIS — R200 Anesthesia of skin: Secondary | ICD-10-CM | POA: Diagnosis present

## 2019-11-27 DIAGNOSIS — I771 Stricture of artery: Secondary | ICD-10-CM | POA: Insufficient documentation

## 2019-11-27 DIAGNOSIS — R519 Headache, unspecified: Secondary | ICD-10-CM | POA: Insufficient documentation

## 2019-11-27 NOTE — Telephone Encounter (Signed)
Called pt regarding her recent mri. Per Dr. Estanislado Pandy, there is nothing on her scan that would cause her current sx. We will keep her appt for 12/23 and see her then. Call if things change. Pt agreed. AW

## 2019-12-03 ENCOUNTER — Other Ambulatory Visit (HOSPITAL_COMMUNITY): Payer: Self-pay

## 2019-12-04 LAB — PLATELET INHIBITION P2Y12

## 2019-12-05 ENCOUNTER — Ambulatory Visit (HOSPITAL_COMMUNITY)
Admission: RE | Admit: 2019-12-05 | Discharge: 2019-12-05 | Disposition: A | Discharge status: Home / Self Care | Payer: Medicare Other | Source: Ambulatory Visit | Attending: Interventional Radiology | Admitting: Interventional Radiology

## 2019-12-05 ENCOUNTER — Other Ambulatory Visit: Payer: Self-pay

## 2019-12-05 ENCOUNTER — Encounter: Payer: Self-pay | Admitting: Cardiology

## 2019-12-05 ENCOUNTER — Telehealth (INDEPENDENT_AMBULATORY_CARE_PROVIDER_SITE_OTHER): Payer: Medicare Other | Admitting: Cardiology

## 2019-12-05 VITALS — BP 119/73 | HR 90 | Ht 65.0 in | Wt 112.0 lb

## 2019-12-05 DIAGNOSIS — Z8673 Personal history of transient ischemic attack (TIA), and cerebral infarction without residual deficits: Secondary | ICD-10-CM | POA: Diagnosis not present

## 2019-12-05 DIAGNOSIS — I771 Stricture of artery: Secondary | ICD-10-CM

## 2019-12-05 DIAGNOSIS — Z7189 Other specified counseling: Secondary | ICD-10-CM

## 2019-12-05 DIAGNOSIS — I639 Cerebral infarction, unspecified: Secondary | ICD-10-CM | POA: Diagnosis not present

## 2019-12-05 DIAGNOSIS — I10 Essential (primary) hypertension: Secondary | ICD-10-CM

## 2019-12-05 NOTE — Progress Notes (Signed)
Brifghette,  Thanks for reaching out. I do not think it is necessary at present time

## 2019-12-05 NOTE — Progress Notes (Signed)
Chief Complaint: Patient was seen in consultation today for port intervention follow up  Referring Physician(s): Deveshwar,Sanjeev  History of Present Illness: Shannon [name] is a 73 y.o. female who is now s/p repeat angiogram and stent angioplasty of right MCA intra-stent stenosis. She did well immediately post op and was discharged in stable condition. She developed some intermittent left side mouth and face numbness a few days after discharge and a STAT MRI?MRA was ordered and completed. This showed tiny punctate infarcts likely related to recent procedure but not felt to be the cause of her sxs. Since that time, she has not had any further episodes. We also asked her to increase her Brilinta to 90 mg bid as well as continuing her ASA. Overall she fells pretty good, some fatigue with prolonged activity as expected. Denies HA, N/V, CP, SOB, leg pain. She is here for follow up today  Past Medical History:  Diagnosis Date  . Anxiety   . 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 Arthritis   . Cancer (HCC)    skin - squamous cell  . Complication of anesthesia    " I had difficulty coming out of it"  . Dyspnea    with exertion  . Family history of adverse reaction to anesthesia    " My daughter has a problem coming ou to it"  . GERD (gastroesophageal reflux disease)   . Headache    migraines in the past  . Hypertension   . Hypothyroidism   . Stroke ([place]) 11/2018   some weakness on right side   . Thyroid disease     Past Surgical History:  Procedure Laterality Date  . ABDOMINAL HYSTERECTOMY    . APPENDECTOMY    . CARDIAC CATHETERIZATION    . CARPAL TUNNEL RELEASE Bilateral   . COLONOSCOPY    . DILATION AND CURETTAGE OF UTERUS    . IR ANGIO INTRA EXTRACRAN SEL COM CAROTID INNOMINATE BILAT MOD SED  12/25/2018  . IR ANGIO INTRA EXTRACRAN SEL COM CAROTID INNOMINATE BILAT MOD SED  10/30/2019  . IR ANGIO VERTEBRAL SEL SUBCLAVIAN INNOMINATE UNI L MOD SED  12/25/2018  . IR ANGIO  VERTEBRAL SEL SUBCLAVIAN INNOMINATE UNI L MOD SED  10/30/2019  . IR ANGIO VERTEBRAL SEL SUBCLAVIAN INNOMINATE UNI R MOD SED  01/04/2019  . IR ANGIO VERTEBRAL SEL VERTEBRAL UNI R MOD SED  12/25/2018  . IR ANGIO VERTEBRAL SEL VERTEBRAL UNI R MOD SED  10/30/2019  . IR CT HEAD LTD  01/04/2019  . IR CT HEAD LTD  11/21/2019  . IR INTRA CRAN STENT  01/04/2019  . IR INTRA CRAN STENT  11/21/2019  . IR US GUIDE VASC ACCESS RIGHT  12/25/2018  . IR US GUIDE VASC ACCESS RIGHT  11/02/2019  . OVARIAN CYST REMOVAL    . RADIOLOGY WITH ANESTHESIA N/A 01/04/2019   Procedure: RMCA angioplasty with possible stenting;  Surgeon: Luanne Bras, MD;  Location: [facility];  Service: Radiology;  Laterality: N/A;  . RADIOLOGY WITH ANESTHESIA N/A 11/21/2019   Procedure: IR WITH ANESTHESIA/ STENT PLACEMENT;  Surgeon: Luanne Bras, MD;  Location: [facility];  Service: Radiology;  Laterality: N/A;    Allergies: Sulfa antibiotics, Mandol [cefamandole], and Sinutab non-drowsy max [pseudoephedrine-acetaminophen]  Medications: Prior to Admission medications   Medication Sig Start Date End Date Taking? Authorizing Provider  acetaminophen (TYLENOL) 325 MG tablet Take 650 mg by mouth every 6 (six) hours as needed for headache.    [provider]  Alum Hydroxide-Mag Carbonate (GAVISCON EXTRA STRENGTH) 508-475 MG/10ML SUSP Take 5 mLs by mouth daily as needed (acid reflux).    [provider]  amLODipine (NORVASC) 2.5 MG tablet Take 1 tablet (2.5 mg total) by mouth daily. 10/17/19 10/11/20  Buford Dresser, MD  aspirin EC 81 MG EC tablet Take 1 tablet (81 mg total) by mouth daily. 12/10/18   Mayo, Pete Pelt, MD  atorvastatin (LIPITOR) 40 MG tablet Take 1 tablet (40 mg total) by mouth daily at 6 PM. 12/09/18   Mayo, Pete Pelt, MD  busPIRone (BUSPAR) 5 MG tablet Take 5 mg by mouth daily as needed (anxiety).  10/12/19   [provider]  Calcium Carb-Cholecalciferol (CALCIUM 600 + D PO) Take 0.5-1 tablets by  mouth See admin instructions. Take 1 tablet after breakfast, 1 tablet after lunch, and 0.5 tablet after dinner    [provider]  Carboxymethylcellulose Sodium (ARTIFICIAL TEARS OP) Place 1 drop into both eyes daily as needed (dry eyes).    [provider]  Coenzyme Q10 50 MG CAPS Take 50 mg by mouth daily.     [provider]  levothyroxine (SYNTHROID) 75 MCG tablet Take 75 mcg by mouth 4 (four) times a week. [name], Thurs, Sat    [provider]  levothyroxine Wilmer Floor, LEVOTHROID) 50 MCG tablet Take 50 mcg by mouth every Monday, Wednesday, and Friday.  04/24/12   [provider]  lisinopril (PRINIVIL,ZESTRIL) 10 MG tablet Take 10 mg by mouth daily.    [provider]  loratadine (CLARITIN) 10 MG tablet Take 10 mg by mouth daily.    [provider]  Multiple Vitamin (MULTIVITAMIN) capsule Take 1 capsule by mouth daily. Senior 04/24/12   [provider]  ticagrelor (BRILINTA) 90 MG TABS tablet Take 1 tablet (90 mg total) by mouth 2 (two) times daily. 12/27/18   Rehman, Areeg N, DO  vitamin E 400 UNIT capsule Take 400 Units by mouth every other day.     [provider]     Family History  Problem Relation Age of Onset  . CVA Mother   . Heart disease Mother   . Peripheral Artery Disease Father   . CVA Maternal Aunt     Social History   Socioeconomic History  . Marital status: Married    Spouse name: Not on file  . Number of children: Not on file  . Years of education: Not on file  . Highest education level: Not on file  Occupational History  . Occupation: retired  Tobacco Use  . Smoking status: Never Smoker  . Smokeless tobacco: Never Used  Substance and Sexual Activity  . Alcohol use: Never  . Drug use: Never  . Sexual activity: Yes  Other Topics Concern  . Not on file  Social History Narrative  . Not on file   Social Determinants of Health   Financial Resource Strain:   . Difficulty of  Paying Living Expenses: Not on file  Food Insecurity:   . Worried About Charity fundraiser in the Last Year: Not on file  . Ran Out of Food in the Last Year: Not on file  Transportation Needs:   . Lack of Transportation (Medical): Not on file  . Lack of Transportation (Non-Medical): Not on file  Physical Activity:   . Days of Exercise per Week: Not on file  . Minutes of Exercise per Session: Not on file  Stress:   . Feeling of Stress : Not on  file  Social Connections:   . Frequency of Communication with Friends and Family: Not on file  . Frequency of Social Gatherings with Friends and Family: Not on file  . Attends Religious Services: Not on file  . Active Member of Clubs or Organizations: Not on file  . Attends Archivist Meetings: Not on file  . Marital Status: Not on file    Review of Systems: A 12 point ROS discussed and pertinent positives are indicated in the HPI above.  All other systems are negative.  Review of Systems  Vital Signs: There were no vitals taken for this visit.  Physical Exam Constitutional:      General: She is not in acute distress.    Appearance: She is not ill-appearing.  HENT:     Head: Normocephalic and atraumatic.     Mouth/Throat:     Mouth: Mucous membranes are moist.     Pharynx: Oropharynx is clear.  Eyes:     Extraocular Movements: Extraocular movements intact.     Pupils: Pupils are equal, round, and reactive to light.  Cardiovascular:     Rate and Rhythm: Normal rate and regular rhythm.     Heart sounds: Normal heart sounds.  Pulmonary:     Effort: Pulmonary effort is normal. No respiratory distress.     Breath sounds: Normal breath sounds.  Skin:    General: Skin is warm and dry.  Neurological:     General: No focal deficit present.     Mental Status: She is alert and oriented to person, place, and time.     Cranial Nerves: No cranial nerve deficit.  Psychiatric:        Mood and Affect: Mood normal.        Thought  Content: Thought content normal.        Judgment: Judgment normal.      Imaging: MR ANGIO HEAD WO CONTRAST  Result Date: 11/27/2019 CLINICAL DATA:  New onset of episodes of left facial numbness. Right temporal headache. History of right MCA stent. EXAM: MRI HEAD WITHOUT CONTRAST MRA HEAD WITHOUT CONTRAST TECHNIQUE: Multiplanar, multiecho pulse sequences of the brain and surrounding structures were obtained without intravenous contrast. Angiographic images of the head were obtained using MRA technique without contrast. COMPARISON:  Procedural imaging 11/21/2019. CT angiography 10/15/2019. MRI 01/05/2019 FINDINGS: MRI HEAD FINDINGS Brain: Diffusion imaging shows 4 punctate acute infarctions scattered within the right parietal and posterior frontal region consistent with micro embolic infarctions in the right MCA territory. No large confluent infarction. Old infarctions in the right basal ganglia and radiating Koerber matter tracts. Minimal small-vessel change elsewhere of the cerebral hemispheric Rosevear matter. No brainstem or cerebellar abnormality. No hemorrhage, hydrocephalus or extra-axial collection. Vascular: Major vessels at the base of the brain show flow. Skull and upper cervical spine: Negative Sinuses/Orbits: Clear/normal.  Diminutive maxillary sinuses. Other: None MRA HEAD FINDINGS Both internal carotid arteries are patent through the skull base. On the left, the anterior and middle cerebral vessels are patent. Mild atherosclerotic narrowing in the A1 and M1 segments but no flow limiting stenosis. More distal branch vessels show atherosclerotic irregularity. On the right, there is narrowing of the supraclinoid ICA by about 30%. Right anterior cerebral artery appears patent without flow limiting stenosis. Right middle cerebral artery shows signal loss related to the presence of the stent. Flow is present through the stent. Distal vessels are not as well seen as on the right, possibly due to a  combination of flow reduction  and signal loss from the stent. Both vertebral arteries are widely patent to the basilar. Mild atherosclerotic irregularity of the vertebrobasilar junction and basilar artery without flow limiting stenosis. Posterior circulation branch vessels are patent. 30-50% stenosis of the P1 segment on the right. Findings are similar to the recent CT angiogram. IMPRESSION: Four punctate micro embolic acute infarctions in the right MCA territory. Old infarctions of both hemispheres as seen previously. No change in the vascular status when compared to previous studies. Signal loss in the right MCA related to the stent. Distal vessels in the right MCA territory do show flow, but there is diminished visualization possibly due to a combination of flow reduction and signal loss secondary to the stent. Electronically Signed   By: Nelson Chimes M.D.   On: 11/27/2019 10:10   MR BRAIN WO CONTRAST  Result Date: 11/27/2019 CLINICAL DATA:  New onset of episodes of left facial numbness. Right temporal headache. History of right MCA stent. EXAM: MRI HEAD WITHOUT CONTRAST MRA HEAD WITHOUT CONTRAST TECHNIQUE: Multiplanar, multiecho pulse sequences of the brain and surrounding structures were obtained without intravenous contrast. Angiographic images of the head were obtained using MRA technique without contrast. COMPARISON:  Procedural imaging 11/21/2019. CT angiography 10/15/2019. MRI 01/05/2019 FINDINGS: MRI HEAD FINDINGS Brain: Diffusion imaging shows 4 punctate acute infarctions scattered within the right parietal and posterior frontal region consistent with micro embolic infarctions in the right MCA territory. No large confluent infarction. Old infarctions in the right basal ganglia and radiating Brosnahan matter tracts. Minimal small-vessel change elsewhere of the cerebral hemispheric Mitchener matter. No brainstem or cerebellar abnormality. No hemorrhage, hydrocephalus or extra-axial collection. Vascular: Major  vessels at the base of the brain show flow. Skull and upper cervical spine: Negative Sinuses/Orbits: Clear/normal.  Diminutive maxillary sinuses. Other: None MRA HEAD FINDINGS Both internal carotid arteries are patent through the skull base. On the left, the anterior and middle cerebral vessels are patent. Mild atherosclerotic narrowing in the A1 and M1 segments but no flow limiting stenosis. More distal branch vessels show atherosclerotic irregularity. On the right, there is narrowing of the supraclinoid ICA by about 30%. Right anterior cerebral artery appears patent without flow limiting stenosis. Right middle cerebral artery shows signal loss related to the presence of the stent. Flow is present through the stent. Distal vessels are not as well seen as on the right, possibly due to a combination of flow reduction and signal loss from the stent. Both vertebral arteries are widely patent to the basilar. Mild atherosclerotic irregularity of the vertebrobasilar junction and basilar artery without flow limiting stenosis. Posterior circulation branch vessels are patent. 30-50% stenosis of the P1 segment on the right. Findings are similar to the recent CT angiogram. IMPRESSION: Four punctate micro embolic acute infarctions in the right MCA territory. Old infarctions of both hemispheres as seen previously. No change in the vascular status when compared to previous studies. Signal loss in the right MCA related to the stent. Distal vessels in the right MCA territory do show flow, but there is diminished visualization possibly due to a combination of flow reduction and signal loss secondary to the stent. Electronically Signed   By: Nelson Chimes M.D.   On: 11/27/2019 10:10   IR Intra Cran Stent  Result Date: 11/23/2019 CLINICAL DATA:  Severe right middle cerebral artery intra stent stenosis. Generalized weakness. EXAM: INTRACRANIAL STENT (INCL PTA) COMPARISON:  Diagnostic arteriogram of January 04, 2019. MEDICATIONS:  Heparin 3,000 units IV; vancomycin 1 g IV antibiotic was administered  within 1 hour of the procedure. ANESTHESIA/SEDATION: Mac anesthesia for diagnostic portion. General anesthesia for therapeutic intervention. CONTRAST:  Isovue 300 approximately 150 mL. FLUOROSCOPY TIME:  Fluoroscopy Time: 103 minutes 42 seconds (3602 mGy). COMPLICATIONS: None immediate. TECHNIQUE: Informed written consent was obtained from the patient after a thorough discussion of the procedural risks, benefits and alternatives. All questions were addressed. Maximal Sterile Barrier Technique was utilized including caps, mask, sterile gowns, sterile gloves, sterile drape, hand hygiene and skin antiseptic. A timeout was performed prior to the initiation of the procedure. The right groin was prepped and draped in the usual sterile fashion. Thereafter using modified Seldinger technique, transfemoral access into the right common femoral artery was obtained without difficulty. Over a 0.035 inch guidewire, a 5 French Pinnacle sheath was inserted. Through this, and also over 0.035 inch guidewire, a 5 [place] JB 1 catheter was advanced to the aortic arch region and selectively positioned in the innominate artery and right common carotid artery. FINDINGS: The innominate artery injection demonstrates the origin of the right subclavian artery and the right common carotid artery to be widely patent. The right vertebral artery origin is widely patent. The vessel is seen to opacify to the cranial skull base to the level of the right vertebrobasilar junction and the proximal basilar artery which appears to be widely patent. The right common carotid arteriogram demonstrates the right external carotid artery and its major branches to be widely patent. The right internal carotid artery at the bulb to the cranial skull base also demonstrates wide patency. The petrous, cavernous and the supraclinoid segments are patent. The right anterior cerebral artery opacifies  into the capillary and venous phases. There is prompt filling via the anterior communicating artery of the left anterior cerebral artery A2 segment and distally. The right middle cerebral artery commencing at its origin and throughout the stented segment of the M1 segment demonstrates severe pre occlusive stenosis with delayed opacification of decompressed right middle cerebral artery distribution in the M3 M4 regions. The delayed arterial phase also demonstrates partial retrograde opacification of the M3 M4 region from the right pericallosal and callosal marginal branches. The venous phase is unremarkable. ENDOVASCULAR REVASCULARIZATION OF SEVERE INTRA STENT STENOSIS OF THE RIGHT MIDDLE CEREBRAL M1 SEGMENT WITH ANGIOPLASTY, AND STENTING. The diagnostic JB 1 catheter in the right common carotid artery was exchanged over a 0.035 inch 300 cm Rosen exchange guidewire for an 8 [place] Pinnacle sheath in the right groin which was connected to continuous heparinized saline infusion. This was connected to continuous heparinized saline infusion. Over the [employer] guidewire, an 8 [place] 80 cm Neuron Max sheath was then advanced and positioned just proximal to the right common carotid artery. The guidewire was removed. Good aspiration obtained from the hub of the Neuron Max sheath. Gentle contrast injection demonstrated no change in the intracranial or extracranial circulation. Over a 0.035 inch Roadrunner guidewire, a 5 French 115 cm Catalyst guide catheter was then advanced to the distal petrous segment. The guidewire was removed. Good aspiration obtained from the hub of the Catalyst guide catheter. Control arteriogram with roadmap was then performed of the right internal carotid artery concentrating on the right middle cerebral artery distribution. Measurements were performed of the opacified portions of the proximal right middle cerebral artery M1 segment. It was decided initially to perform intra stent angioplasty.  A 1.5 mm x 15 mm Gateway angioplasty microcatheter was then prepped and draped with 75% contrast and 25% heparinized saline infusion. Over a 0.014 inch standard  Synchro micro guidewire, the balloon was then advanced to the distal end of the Catalyst guide catheter. With the micro guidewire leading with a J tip configuration to avoid dissections or inducing spasm, and torque device, the micro guidewire was advanced without difficulty through the stenosed right middle cerebral artery M1 segment and into the M2 M3 inferior division. The balloon was then gently brought and advanced without difficulty within the stented segment of the right M1 segment. Thereafter, angioplasties were then performed using micro inflation syringe device via micro tubing. Slow inflation was performed with small increments to initially 6 atmospheres. The balloon was maintained inflated for approximately 1 minute. This was then retrieved slightly more proximally. A control arteriogram performed revealed persistent waist in the stented portion of the stented segment. A second angioplasty was performed with slow increments with micro inflation syringe device via micro tubing to 7 atmospheres where it was maintained for approximately 45 seconds. Balloon was deflated and retrieved more proximally. A control arteriogram performed through the Catalyst guide catheter demonstrated significantly improved caliber and flow through the entirety of the right middle cerebral artery proximally. Improved opacification of the distal middle cerebral artery distribution. A 1.5 mm x 15 mm balloon was then advanced to the M2 M3 region of the inferior division over the micro guidewire. The guidewire was then removed. Good aspiration was obtained from the hub of the Gateway balloon angioplasty catheter. This in turn was then exchanged for a 300 cm Softip Transend EX exchange micro guidewire. Following removal of the Gateway angioplasty microcatheter, a control  arteriogram performed through the 5 [place] Catalyst guide catheter demonstrated improved flow through the angioplastied segment but also a stenosis in the more proximal portion of the right middle cerebral artery. A 2 mm x 9 mm Gateway angioplasty balloon microcatheter were again advanced after preparation as described above over the exchange micro guidewire. It was brought into position in the proximal portion of the right middle cerebral artery M1 segment. Control inflations were performed at the origin of the right middle cerebral artery, and then slightly more distally in the proximal right middle cerebral artery and the proximal portion of the previously positioned stent. Inflations were again performed and control made as described above. Initially it was at 4.2 atmospheres, then slightly more distally 5.5 atmospheres, and eventually 9 atmospheres. The duration of the angioplasty was approximately 45 seconds at each of the sites. Following these angioplasties, a control arteriogram performed through the Catalyst guide catheter demonstrated robust flow through the right MCA distribution in the stented segment and distally. However, there was a linear filling defect felt to be an atherosclerotic plaque which extended to the proximal portion of the previous stent. Control arteriograms performed over 20 minutes continued to demonstrate progressively decreased flow through the angioplastied segment in the right middle cerebral artery. It was decided to place a Wingspan 3.5 mm x 20 mm system in order to provide support at the site of this linear plaque projecting into the lumen of the left middle cerebral artery proximally. Following prepping of the Wingspan system in the delivery microcatheter, and the inner core, and with continuous heparinized saline infusion, the delivery system was advanced without difficulty to just proximal to the previously positioned stent markers. Multiple attempts were then made to  advance the delivery microcatheter with the stent. Despite multiple attempts, and despite the exchange with different exchange micro guidewires, the system could not be advanced because of significant resistance at the site of the proximal previous stent.  The system was then retrieved and removed. A control arteriogram performed through the Catalyst guide catheter in the right internal carotid artery continued to initially reveal brisk flow through the previously positioned stent. However, there continued to be progressive slowing of hemodynamic flow through the proximal portion of the first stent. It was, therefore, decided to proceed with placement of a rescue stent. Over a 0.014 inch standard Synchro micro guidewire with a J configuration, a 17 Headway 2 tip microcatheter was advanced without difficulty to the supraclinoid right ICA. The micro guidewire was then gently advanced along the inferior aspect of the plaque without resistance and advanced to the M2 M3 region of the inferior division of the right middle cerebral artery. The microcatheter was then advanced without difficulty to just distal to the right MCA trifurcation branches. The micro guidewire was then removed. Good aspiration was obtained from the hub of the microcatheter. A gentle control arteriogram performed through microcatheter demonstrated safe position of the tip of the microcatheter which was then connected to continuous heparinized saline infusion. A 4 mm x 24 mm Neuroform Atlas stent was then advanced in a coaxial manner and with constant heparinized saline infusion to the distal end of the microcatheter. The entire system was then straightened. The proximal and the distal landing zones were then defined. The O ring on the delivery microcatheter was released. With steady traction with the right hand on the delivery micro guidewire, with the left hand the distal and then the proximal portion of the stent were deployed without difficulty.  The proximal portion was deliberately positioned in the distal supraclinoid right ICA to provide support and visual forces for ease of hemodynamic flow. A control arteriogram performed through the 5 [place] Catalyst guide catheter in the right internal carotid artery following removal of the microcatheter delivery and micro guidewire systems demonstrated excellent flow through the entirety of the right middle cerebral artery. A good distal right MCA distribution opacification was noted. Control arteriograms were then performed at 15 and 30 minutes following placement of the second rescue stent. No evidence of intraluminal filling defects or of distal occlusion was seen. A final control arteriogram performed through the Neuron Max sheath in the right common carotid artery demonstrated unchanged opacification of the right internal carotid artery extra cranially and intracranially and also of the right middle cerebral artery and the right anterior cerebral artery distributions. Throughout the procedure, the patient's blood pressure and neurological status remained stable. An 8 French Pinnacle sheath was then removed with successful hemostasis with an 8 [place] Angio-Seal closure device. Nevertheless, a compression bandage was also applied. A CT of the brain demonstrated no evidence of intracranial hemorrhage, mass effect or midline shift. Distal pulses remained palpable in the dorsalis pedis, and the posterior tibial arteries bilaterally unchanged. The patient's ACT was in the range of 300 seconds which prompted the use of 20 mg of protamine sulfate to partially reversal the effects of IV heparin. The patient was then extubated without difficulty. Upon recovery, the patient complained of a severe sore throat. Otherwise, she did not complain of any headaches, nausea or vomiting. The following day upon recovery, the patient was oriented to place and year. She was able to move all 4s adequately with slight weakness in the  left hand grip. She was then transferred to PACU and then neuro ICU to continue on low-dose IV heparin, and also close neurologic observations, and blood pressure control. Toward the end of the day, the patient had also completely recovered.  However, she did still complain of pain in the hips bilaterally though there was no focal tenderness or groin changes bilaterally. The following morning, her IV heparin was stopped and the patient switched to aspirin 81 mg per day, and Brilinta 45 mg b.i.d. The right groin appeared soft without evidence of hematoma or tenderness. She was more alert and awake and eating breakfast without difficulty. The patient will be sat up in the chair with physical therapy consultation to evaluate for improved mobility prior to considering patient discharge. 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 PLAN: Follow-up in the clinic in 2 weeks time. Prior to discharge, the patient was advised to maintain adequate hydration and take her home meds. She was advised to refrain from stooping, bending or lifting weights greater than 10 pounds for 2 weeks. She was also advised to refrain from driving for at least 2 weeks. The patient expressed understanding and agreement with the above management plan. Electronically Signed   By: Luanne Bras M.D.   On: 11/22/2019 09:30   IR CT [facility]  Result Date: 11/21/2019 CLINICAL DATA:  Severe right middle cerebral artery intra stent stenosis. Generalized weakness.  EXAM: INTRACRANIAL STENT (INCL PTA)  COMPARISON:  Diagnostic arteriogram of January 04, 2019.  MEDICATIONS: Heparin 3,000 units IV; vancomycin 1 g IV antibiotic was administered within 1 hour of the procedure.  ANESTHESIA/SEDATION: Mac anesthesia for diagnostic portion. General anesthesia for therapeutic intervention.  CONTRAST:  Isovue 300 approximately 150 mL.  FLUOROSCOPY TIME:   Fluoroscopy Time: 103 minutes 42 seconds (3602 mGy).  COMPLICATIONS: None immediate.  TECHNIQUE: Informed written consent was obtained from the patient after a thorough discussion of the procedural risks, benefits and alternatives. All questions were addressed. Maximal Sterile Barrier Technique was utilized including caps, mask, sterile gowns, sterile gloves, sterile drape, hand hygiene and skin antiseptic. A timeout was performed prior to the initiation of the procedure.  The right groin was prepped and draped in the usual sterile fashion. Thereafter using modified Seldinger technique, transfemoral access into the right common femoral artery was obtained without difficulty. Over a 0.035 inch guidewire, a 5 French Pinnacle sheath was inserted. Through this, and also over 0.035 inch guidewire, a 5 [place] JB 1 catheter was advanced to the aortic arch region and selectively positioned in the innominate artery and right common carotid artery.  FINDINGS: The innominate artery injection demonstrates the origin of the right subclavian artery and the right common carotid artery to be widely patent.  The right vertebral artery origin is widely patent.  The vessel is seen to opacify to the cranial skull base to the level of the right vertebrobasilar junction and the proximal basilar artery which appears to be widely patent.  The right common carotid arteriogram demonstrates the right external carotid artery and its major branches to be widely patent.  The right internal carotid artery at the bulb to the cranial skull base also demonstrates wide patency. The petrous, cavernous and the supraclinoid segments are patent.  The right anterior cerebral artery opacifies into the capillary and venous phases. There is prompt filling via the anterior communicating artery of the left anterior cerebral artery A2 segment and distally.  The right middle cerebral artery commencing at its origin and throughout the stented segment of  the M1 segment demonstrates severe pre occlusive stenosis with delayed opacification of decompressed right middle cerebral artery distribution in the M3 M4 regions.  The  delayed arterial phase also demonstrates partial retrograde opacification of the M3 M4 region from the right pericallosal and callosal marginal branches. The venous phase is unremarkable.  ENDOVASCULAR REVASCULARIZATION OF SEVERE INTRA STENT STENOSIS OF THE RIGHT MIDDLE CEREBRAL M1 SEGMENT WITH ANGIOPLASTY, AND STENTING.  The diagnostic JB 1 catheter in the right common carotid artery was exchanged over a 0.035 inch 300 cm Rosen exchange guidewire for an 8 [place] Pinnacle sheath in the right groin which was connected to continuous heparinized saline infusion. This was connected to continuous heparinized saline infusion.  Over the [employer] guidewire, an 8 [place] 80 cm Neuron Max sheath was then advanced and positioned just proximal to the right common carotid artery. The guidewire was removed. Good aspiration obtained from the hub of the Neuron Max sheath. Gentle contrast injection demonstrated no change in the intracranial or extracranial circulation.  Over a 0.035 inch Roadrunner guidewire, a 5 French 115 cm Catalyst guide catheter was then advanced to the distal petrous segment. The guidewire was removed. Good aspiration obtained from the hub of the Catalyst guide catheter. Control arteriogram with roadmap was then performed of the right internal carotid artery concentrating on the right middle cerebral artery distribution.  Measurements were performed of the opacified portions of the proximal right middle cerebral artery M1 segment.  It was decided initially to perform intra stent angioplasty. A 1.5 mm x 15 mm Gateway angioplasty microcatheter was then prepped and draped with 75% contrast and 25% heparinized saline infusion. Over a 0.014 inch standard Synchro micro guidewire, the balloon was then advanced to the distal end of the  Catalyst guide catheter. With the micro guidewire leading with a J tip configuration to avoid dissections or inducing spasm, and torque device, the micro guidewire was advanced without difficulty through the stenosed right middle cerebral artery M1 segment and into the M2 M3 inferior division. The balloon was then gently brought and advanced without difficulty within the stented segment of the right M1 segment.  Thereafter, angioplasties were then performed using micro inflation syringe device via micro tubing. Slow inflation was performed with small increments to initially 6 atmospheres. The balloon was maintained inflated for approximately 1 minute. This was then retrieved slightly more proximally. A control arteriogram performed revealed persistent waist in the stented portion of the stented segment. A second angioplasty was performed with slow increments with micro inflation syringe device via micro tubing to 7 atmospheres where it was maintained for approximately 45 seconds. Balloon was deflated and retrieved more proximally. A control arteriogram performed through the Catalyst guide catheter demonstrated significantly improved caliber and flow through the entirety of the right middle cerebral artery proximally. Improved opacification of the distal middle cerebral artery distribution.  A 1.5 mm x 15 mm balloon was then advanced to the M2 M3 region of the inferior division over the micro guidewire. The guidewire was then removed. Good aspiration was obtained from the hub of the Gateway balloon angioplasty catheter. This in turn was then exchanged for a 300 cm Softip Transend EX exchange micro guidewire. Following removal of the Gateway angioplasty microcatheter, a control arteriogram performed through the 5 [place] Catalyst guide catheter demonstrated improved flow through the angioplastied segment but also a stenosis in the more proximal portion of the right middle cerebral artery.  A 2 mm x 9 mm Gateway  angioplasty balloon microcatheter were again advanced after preparation as described above over the exchange micro guidewire. It was brought into position in the proximal portion of the right middle  cerebral artery M1 segment. Control inflations were performed at the origin of the right middle cerebral artery, and then slightly more distally in the proximal right middle cerebral artery and the proximal portion of the previously positioned stent.  Inflations were again performed and control made as described above.  Initially it was at 4.2 atmospheres, then slightly more distally 5.5 atmospheres, and eventually 9 atmospheres. The duration of the angioplasty was approximately 45 seconds at each of the sites. Following these angioplasties, a control arteriogram performed through the Catalyst guide catheter demonstrated robust flow through the right MCA distribution in the stented segment and distally. However, there was a linear filling defect felt to be an atherosclerotic plaque which extended to the proximal portion of the previous stent.  Control arteriograms performed over 20 minutes continued to demonstrate progressively decreased flow through the angioplastied segment in the right middle cerebral artery.  It was decided to place a Wingspan 3.5 mm x 20 mm system in order to provide support at the site of this linear plaque projecting into the lumen of the left middle cerebral artery proximally.  Following prepping of the Wingspan system in the delivery microcatheter, and the inner core, and with continuous heparinized saline infusion, the delivery system was advanced without difficulty to just proximal to the previously positioned stent markers.  Multiple attempts were then made to advance the delivery microcatheter with the stent. Despite multiple attempts, and despite the exchange with different exchange micro guidewires, the system could not be advanced because of significant resistance at the site of the  proximal previous stent.  The system was then retrieved and removed. A control arteriogram performed through the Catalyst guide catheter in the right internal carotid artery continued to initially reveal brisk flow through the previously positioned stent.  However, there continued to be progressive slowing of hemodynamic flow through the proximal portion of the first stent. It was, therefore, decided to proceed with placement of a rescue stent. Over a 0.014 inch standard Synchro micro guidewire with a J configuration, a 17 Headway 2 tip microcatheter was advanced without difficulty to the supraclinoid right ICA. The micro guidewire was then gently advanced along the inferior aspect of the plaque without resistance and advanced to the M2 M3 region of the inferior division of the right middle cerebral artery. The microcatheter was then advanced without difficulty to just distal to the right MCA trifurcation branches.  The micro guidewire was then removed. Good aspiration was obtained from the hub of the microcatheter. A gentle control arteriogram performed through microcatheter demonstrated safe position of the tip of the microcatheter which was then connected to continuous heparinized saline infusion.  A 4 mm x 24 mm Neuroform Atlas stent was then advanced in a coaxial manner and with constant heparinized saline infusion to the distal end of the microcatheter. The entire system was then straightened. The proximal and the distal landing zones were then defined.  The O ring on the delivery microcatheter was released. With steady traction with the right hand on the delivery micro guidewire, with the left hand the distal and then the proximal portion of the stent were deployed without difficulty.  The proximal portion was deliberately positioned in the distal supraclinoid right ICA to provide support and visual forces for ease of hemodynamic flow.  A control arteriogram performed through the 5 [place] Catalyst  guide catheter in the right internal carotid artery following removal of the microcatheter delivery and micro guidewire systems demonstrated excellent flow through the entirety of  the right middle cerebral artery. A good distal right MCA distribution opacification was noted.  Control arteriograms were then performed at 15 and 30 minutes following placement of the second rescue stent. No evidence of intraluminal filling defects or of distal occlusion was seen.  A final control arteriogram performed through the Neuron Max sheath in the right common carotid artery demonstrated unchanged opacification of the right internal carotid artery extra cranially and intracranially and also of the right middle cerebral artery and the right anterior cerebral artery distributions.  Throughout the procedure, the patient's blood pressure and neurological status remained stable.  An 8 French Pinnacle sheath was then removed with successful hemostasis with an 8 [place] Angio-Seal closure device.  Nevertheless, a compression bandage was also applied.  A CT of the brain demonstrated no evidence of intracranial hemorrhage, mass effect or midline shift.  Distal pulses remained palpable in the dorsalis pedis, and the posterior tibial arteries bilaterally unchanged.  The patient's ACT was in the range of 300 seconds which prompted the use of 20 mg of protamine sulfate to partially reversal the effects of IV heparin.  The patient was then extubated without difficulty. Upon recovery, the patient complained of a severe sore throat. Otherwise, she did not complain of any headaches, nausea or vomiting. The following day  upon recovery, the patient was oriented to place and year. She was able to move all 4s adequately with slight weakness in the left hand grip.  She was then transferred to PACU and then neuro ICU to continue on low-dose IV heparin, and also close neurologic observations, and blood pressure control.  Toward the end of  the day, the patient had also completely recovered.  However, she did still complain of pain in the hips bilaterally though there was no focal tenderness or groin changes bilaterally. The following morning, her IV heparin was stopped and the patient switched to aspirin 81 mg per day, and Brilinta 45 mg b.i.d. The right groin appeared soft without evidence of hematoma or tenderness. She was more alert and awake and eating breakfast without difficulty.  The patient will be sat up in the chair with physical therapy consultation to evaluate for improved mobility prior to considering patient discharge.  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  PLAN: Follow-up in the clinic in 2 weeks time. Prior to discharge, the patient was advised to maintain adequate hydration and take her home meds. She was advised to refrain from stooping, bending or lifting weights greater than 10 pounds for 2 weeks. She was also advised to refrain from driving for at least 2 weeks.  The patient expressed understanding and agreement with the above management plan.   Electronically Signed   By: Luanne Bras M.D.   On: 11/22/2019 09:30    Labs:  CBC: Recent Labs    09/30/19 1833 10/30/19 0631 11/21/19 0635 11/22/19 0500  WBC 4.0 4.4 3.7* 6.2  HGB 13.2 14.6 13.3 11.1*  HCT 39.5 43.6 39.9 32.5*  PLT 185 199 187 173    COAGS: Recent Labs    12/24/18 0750 01/04/19 0619 10/30/19 0631 11/21/19 0635 11/21/19 1400  INR 1.07 0.96 1.0 0.9  --   APTT 22*  --   --  30 >200*    BMP: Recent Labs    09/30/19 1833 10/30/19 0631 11/21/19 0635 11/22/19 0500  NA 140 142 141 137  K 4.1 4.2 4.0 3.0*  CL  107 104 105 108  CO2 _0 21*  GLUCOSE 107* 93 89 89  BUN _1 6*  CALCIUM 9.5 9.8 9.9 8.0*  CREATININE 0.79 0.71 0.74 0.59  GFRNONAA >60 >60 >60 >60  GFRAA >60 >60 >60 >60    LIVER FUNCTION  TESTS: Recent Labs    12/08/18 0929 12/24/18 0750  BILITOT 0.5 0.9  AST 24 26  ALT 21 21  ALKPHOS 73 63  PROT 7.1 7.0  ALBUMIN 4.3 4.1    TUMOR MARKERS: No results for input(s): AFPTM, CEA, CA199, CHROMGRNA in the last 8760 hours.  Assessment and Plan: S/p cerebral angio with stent angioplasty of recurrent (R)MCA stenosis Post procedure left face/mouth paresthesias seem to have resolved. MRA shows tiny punctate infarcts. Recommend she cont ASA and Brilinta 28m bid Can resume activity including walking and housework. Needs to keep regular follow up with PCP, Neuro, Cardiology, and [place] Will set her up for CTA in about 3 months for reassessment. She also has known left vert artery stenosis which we are following closely  Thank you for this interesting consult.  I greatly enjoyed meeting CGearl Kimbroughand look forward to participating in their care.  A copy of this report was sent to the requesting provider on this date.  Electronically Signed: KAscencion Dike12/23/2020, 1:31 PM   I spent a total of 25 minutes in face to face in clinical consultation, greater than 50% of which was counseling/coordinating care for intracranial stenosis

## 2019-12-05 NOTE — Patient Instructions (Signed)

## 2019-12-05 NOTE — Progress Notes (Signed)
Virtual Visit via Telephone Note   This visit type was conducted due to national recommendations for restrictions regarding the COVID-19 Pandemic (e.g. social distancing) in an effort to limit this patient's exposure and mitigate transmission in our community.  Due to her co-morbid illnesses, this patient is at least at moderate risk for complications without adequate follow up.  This format is felt to be most appropriate for this patient at this time.  The patient did not have access to video technology/had technical difficulties with video requiring transitioning to audio format only (telephone).  All issues noted in this document were discussed and addressed.  No physical exam could be performed with this format.  Please refer to the patient's chart for her  consent to telehealth for [facility].   Date:  12/05/2019   ID:  Shannon [name], DOB 11-20-1946, MRN [id number]  Patient Location: Home Provider Location: Home  PCP:  Donalynn Furlong, MD  Cardiologist:  Buford Dresser, MD  Electrophysiologist:  None   Evaluation Performed:  Follow-Up Visit  Chief Complaint:  Follow up  History of Present Illness:    Shannon [name] is a 73 y.o. female with PMH hypertension, CVA, recent MCA stenting.  The patient does not have symptoms concerning for COVID-19 infection (fever, chills, cough, or new shortness of breath).   Today: Recently had angiogram and stenting of Rt MCA M1 by Dr. Estanislado Pandy. Had brief facial numbness post procedure, but this has resolved. Headaches have improved as well. Generally energy level low but no other specific concerns. Has follow up later today with Dr. Arlean Hopping office.  Blood pressures have been much better controlled. Takes home BP frequently, though missed some days around her neuroradiology procedure.  Highest readings: 11/5 started amlodipine 145/73, 73 HR, then 126/73 later that day 11/9 after walking, had a headache 151/70, 85  Most have been  110s-130s/70s on average. Discussed natural variation, how pain/exercise affects BP, etc.  Denies chest pain, shortness of breath at rest. No PND, orthopnea, LE edema or unexpected weight gain. No syncope or palpitations.  Still has chronic SOB with climbing stairs, etc but feels tired easily.  Will send note to Dr. Leonie Man re: 30 day monitor need. She has not had a monitor done since living in [place] in the 1990s. With stenosis that has now been treated, unclear if this is still needed for stroke workup.  Cardiac notes reviewed in Care Everywhere: Per Dr. Carmelina Paddock note 01/22/2016 . Cardiac catheterization 04/2012  Nonobstructive 30% RCA disease otherwise NL and EF 65%   Past Medical History:  Diagnosis Date  . Anxiety   . 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 Arthritis   . Cancer (HCC)    skin - squamous cell  . Complication of anesthesia    " I had difficulty coming out of it"  . Dyspnea    with exertion  . Family history of adverse reaction to anesthesia    " My daughter has a problem coming ou to it"  . GERD (gastroesophageal reflux disease)   . Headache    migraines in the past  . Hypertension   . Hypothyroidism   . Stroke ([place]) 11/2018   some weakness on right side   . Thyroid disease    Past Surgical History:  Procedure Laterality Date  . ABDOMINAL HYSTERECTOMY    . APPENDECTOMY    . CARDIAC CATHETERIZATION    . CARPAL TUNNEL RELEASE Bilateral   . COLONOSCOPY    .  DILATION AND CURETTAGE OF UTERUS    . IR ANGIO INTRA EXTRACRAN SEL COM CAROTID INNOMINATE BILAT MOD SED  12/25/2018  . IR ANGIO INTRA EXTRACRAN SEL COM CAROTID INNOMINATE BILAT MOD SED  10/30/2019  . IR ANGIO VERTEBRAL SEL SUBCLAVIAN INNOMINATE UNI L MOD SED  12/25/2018  . IR ANGIO VERTEBRAL SEL SUBCLAVIAN INNOMINATE UNI L MOD SED  10/30/2019  . IR ANGIO VERTEBRAL SEL SUBCLAVIAN INNOMINATE UNI R MOD SED  01/04/2019  . IR ANGIO VERTEBRAL SEL VERTEBRAL UNI R MOD SED  12/25/2018  . IR ANGIO VERTEBRAL SEL  VERTEBRAL UNI R MOD SED  10/30/2019  . IR CT HEAD LTD  01/04/2019  . IR CT HEAD LTD  11/21/2019  . IR INTRA CRAN STENT  01/04/2019  . IR INTRA CRAN STENT  11/21/2019  . IR US GUIDE VASC ACCESS RIGHT  12/25/2018  . IR US GUIDE VASC ACCESS RIGHT  11/02/2019  . OVARIAN CYST REMOVAL    . RADIOLOGY WITH ANESTHESIA N/A 01/04/2019   Procedure: RMCA angioplasty with possible stenting;  Surgeon: Luanne Bras, MD;  Location: [place];  Service: Radiology;  Laterality: N/A;  . RADIOLOGY WITH ANESTHESIA N/A 11/21/2019   Procedure: IR WITH ANESTHESIA/ STENT PLACEMENT;  Surgeon: Luanne Bras, MD;  Location: [facility];  Service: Radiology;  Laterality: N/A;     Current Meds  Medication Sig  . acetaminophen (TYLENOL) 325 MG tablet Take 650 mg by mouth every 6 (six) hours as needed for headache.  . Alum Hydroxide-Mag Carbonate (GAVISCON EXTRA STRENGTH) 508-475 MG/10ML SUSP Take 5 mLs by mouth daily as needed (acid reflux).  Marland Kitchen amLODipine (NORVASC) 2.5 MG tablet Take 1 tablet (2.5 mg total) by mouth daily.  Marland Kitchen aspirin EC 81 MG EC tablet Take 1 tablet (81 mg total) by mouth daily.  Marland Kitchen atorvastatin (LIPITOR) 40 MG tablet Take 1 tablet (40 mg total) by mouth daily at 6 PM.  . busPIRone (BUSPAR) 5 MG tablet Take 5 mg by mouth daily as needed (anxiety).   . Calcium Carb-Cholecalciferol (CALCIUM 600 + D PO) Take 0.5-1 tablets by mouth See admin instructions. Take 1 tablet after breakfast, 1 tablet after lunch, and 0.5 tablet after dinner  . Carboxymethylcellulose Sodium (ARTIFICIAL TEARS OP) Place 1 drop into both eyes daily as needed (dry eyes).  . Coenzyme Q10 50 MG CAPS Take 50 mg by mouth daily.   Marland Kitchen levothyroxine (SYNTHROID) 75 MCG tablet Take 75 mcg by mouth 4 (four) times a week. [name], Thurs, Sat  . levothyroxine (SYNTHROID, LEVOTHROID) 50 MCG tablet Take 50 mcg by mouth every Monday, Wednesday, and Friday.   Marland Kitchen lisinopril (PRINIVIL,ZESTRIL) 10 MG tablet Take 10 mg by mouth daily.  Marland Kitchen loratadine (CLARITIN)  10 MG tablet Take 10 mg by mouth daily.  . Multiple Vitamin (MULTIVITAMIN) capsule Take 1 capsule by mouth daily. Senior  . ticagrelor (BRILINTA) 90 MG TABS tablet Take 1 tablet (90 mg total) by mouth 2 (two) times daily.  . vitamin E 400 UNIT capsule Take 400 Units by mouth every other day.   . [DISCONTINUED] levothyroxine (SYNTHROID, LEVOTHROID) 75 MCG tablet Take 75 mcg by mouth 4 (four) times a week.      Allergies:   Sulfa antibiotics, Mandol [cefamandole], and Sinutab non-drowsy max [pseudoephedrine-acetaminophen]   Social History   Tobacco Use  . Smoking status: Never Smoker  . Smokeless tobacco: Never Used  Substance Use Topics  . Alcohol use: Never  . Drug use: Never     Family Hx: The  patient's family history includes CVA in her maternal aunt and mother; Heart disease in her mother; Peripheral Artery Disease in her father.  ROS:   Please see the history of present illness.    Constitutional: Negative for chills, fever, night sweats, unintentional weight loss  HENT: Negative for ear pain and hearing loss.   Eyes: Negative for loss of vision and eye pain.  Respiratory: Negative for cough, sputum, wheezing.   Cardiovascular: See HPI. Gastrointestinal: Negative for abdominal pain, melena, and hematochezia.  Genitourinary: Negative for dysuria and hematuria.  Musculoskeletal: Negative for falls and myalgias.  Skin: Negative for itching and rash.  Neurological: Negative for focal weakness, focal sensory changes and loss of consciousness.  Endo/Heme/Allergies: Does not bruise/bleed easily.  All other systems reviewed and are negative.   Prior CV studies:   The following studies were reviewed today: Echo 12/09/18 reviewed  Labs/Other Tests and Data Reviewed:    EKG:  An ECG dated 10/17/2019 was personally reviewed today and demonstrated:  nsr  Recent Labs: 12/08/2018: TSH 1.901 12/24/2018: ALT 21 11/22/2019: BUN 6; Creatinine, Ser 0.59; Hemoglobin 11.1; Platelets 173;  Potassium 3.0; Sodium 137   Recent Lipid Panel Lab Results  Component Value Date/Time   CHOL 199 12/09/2018 03:52 AM   TRIG 110 12/09/2018 03:52 AM   HDL 45 12/09/2018 03:52 AM   CHOLHDL 4.4 12/09/2018 03:52 AM   LDLCALC 132 (H) 12/09/2018 03:52 AM    Wt Readings from Last 3 Encounters:  12/05/19 112 lb (50.8 kg)  11/21/19 117 lb (53.1 kg)  10/30/19 117 lb 3.2 oz (53.2 kg)     Objective:    Vital Signs:  BP 119/73   Pulse 90   Ht 5\' 5"  (1.651 m)   Wt 112 lb (50.8 kg)   BMI 18.64 kg/m    Speaking comfortably on the phone, no audible wheezing In no acute distress Alert and oriented Normal affect Normal speech  ASSESSMENT & PLAN:    Hypertension: BP readings much improved since last visit -continue lisinopril 10 mg daily -continue amlodipine 2.5 mg daily -reviewed home BP checking -instructed on red flag warning signs that need immediate medical attention -would avoid beta blockers in the future given her fatigue.  History of CVA, with need for secondary prevention: -on aspirin, ticagrelor. Ticagrelor dose/duration per interventional neurology -on high intensity atorvastatin -echo 12/09/18 was normal, negative bubble study -will send a note to Dr. Leonie Man as notes from 2019 mention 30 day monitor, but she has since been treated for stenosis.  COVID-19 Education: The signs and symptoms of COVID-19 were discussed with the patient and how to seek care for testing (follow up with PCP or arrange E-visit).  The importance of social distancing was discussed today.  CV risk and secondary prevention -recommend heart healthy/Mediterranean diet, with whole grains, fruits, vegetable, fish, lean meats, nuts, and olive oil. Limit salt. -recommend moderate walking, 3-5 times/week for 30-50 minutes each session. Aim for at least 150 minutes.week. Goal should be pace of 3 miles/hours, or walking 1.5 miles in 30 minutes -recommend avoidance of tobacco products. Avoid excess  alcohol.  Time:   Today, I have spent 26 minutes with the patient with telehealth technology discussing the above problems.    Patient Instructions  Medication Instructions:  Your Physician recommend you continue on your current medication as directed.    *If you need a refill on your cardiac medications before your next appointment, please call your pharmacy*  Lab Work: None  Testing/Procedures: None  Follow-Up: At [facility], you and your health needs are our priority.  As part of our continuing mission to provide you with exceptional heart care, we have created designated Provider Care Teams.  These Care Teams include your primary Cardiologist (physician) and Advanced Practice Providers (APPs -  Physician Assistants and Nurse Practitioners) who all work together to provide you with the care you need, when you need it.  Your next appointment:   6 month(s)  The format for your next appointment:   In Person  Provider:   Buford Dresser, MD      Signed, Buford Dresser, MD  12/05/2019 9:26 AM    [place]

## 2019-12-11 ENCOUNTER — Telehealth: Payer: Self-pay | Admitting: Student

## 2019-12-11 NOTE — Telephone Encounter (Signed)
NIR.  Received fax prescription refill request for Brilinta. Faxed filled prescription request to [facility] in [place], [place] [phone]) at [place] 90 mg tablets, take one tablet by mouth twice daily, dispense 180 tablets with 1 refill.   Bea Graff Beyounce Dickens, PA-C 12/11/2019, 9:34 AM

## 2020-01-03 ENCOUNTER — Ambulatory Visit: Payer: Medicare Other | Attending: Internal Medicine

## 2020-01-03 DIAGNOSIS — Z23 Encounter for immunization: Secondary | ICD-10-CM | POA: Insufficient documentation

## 2020-01-03 NOTE — Progress Notes (Signed)
   Covid-19 Vaccination Clinic  Name:  Shannon [name]    MRN: [id number] DOB: [birth year]  01/03/2020  [name] was observed post Covid-19 immunization for 15 minutes without incidence. She was provided with Vaccine Information Sheet and instruction to access the V-Safe system.   [name] was instructed to call 911 with any severe reactions post vaccine: Marland Kitchen Difficulty breathing  . Swelling of your face and throat  . A fast heartbeat  . A bad rash all over your body  . Dizziness and weakness    Immunizations Administered    Name Date Dose VIS Date Route   Pfizer COVID-19 Vaccine 01/03/2020  4:45 PM 0.3 mL 11/23/2019 Intramuscular   Manufacturer: [place]   Lot: [id number]   [place]: [id number]

## 2020-01-08 ENCOUNTER — Telehealth: Payer: Self-pay | Admitting: Student

## 2020-01-08 NOTE — Telephone Encounter (Signed)
NIR.  Received fax prescription refill request for Brilinta. Faxed filled prescription request to [facility] in [place], [place] [phone]) at [phone]- Brilinta 90 mg tablets, take one tablet by mouth twice daily, dispense 180 tablets with 1 refill.   Bea Graff Daelynn Blower, PA-C 01/08/2020, 10:45 AM

## 2020-01-24 ENCOUNTER — Ambulatory Visit: Payer: Medicare Other | Attending: Internal Medicine

## 2020-01-24 DIAGNOSIS — Z23 Encounter for immunization: Secondary | ICD-10-CM

## 2020-01-24 NOTE — Progress Notes (Signed)
   Covid-19 Vaccination Clinic  Name:  Shannon [name]    MRN: [id number] DOB: [birth year]  01/24/2020  [name] was observed post Covid-19 immunization for 15 minutes without incidence. She was provided with Vaccine Information Sheet and instruction to access the V-Safe system.   [name] was instructed to call 911 with any severe reactions post vaccine: Marland Kitchen Difficulty breathing  . Swelling of your face and throat  . A fast heartbeat  . A bad rash all over your body  . Dizziness and weakness    Immunizations Administered    Name Date Dose VIS Date Route   Pfizer COVID-19 Vaccine 01/24/2020  1:50 PM 0.3 mL 11/23/2019 Intramuscular   Manufacturer: [place]   Lot: [id number]   [place]: [id number]

## 2020-01-25 LAB — HM MAMMOGRAPHY

## 2020-03-06 ENCOUNTER — Telehealth (HOSPITAL_COMMUNITY): Payer: Self-pay | Admitting: Radiology

## 2020-03-06 NOTE — Telephone Encounter (Signed)
Returned pt's call about when her follow-up is due. I told her it was due in April. She agrees with that plan. She also states that she has some questions regarding her condition and treatment. She would like to speak with a PA to ask these questions. I will pass this note along to the [facility] PA today. JM

## 2020-03-07 ENCOUNTER — Telehealth: Payer: Self-pay | Admitting: Student

## 2020-03-07 NOTE — Telephone Encounter (Signed)
PA received a message stating patient was requesting a call to discuss her condition and treatment.   Attempted to return call, however no answer and identifying voicemail.  Will continue to reach out to patient.   Brynda Greathouse, MS RD PA-C

## 2020-03-10 ENCOUNTER — Telehealth: Payer: Self-pay | Admitting: Student

## 2020-03-10 NOTE — Telephone Encounter (Signed)
Called patient in follow-up to last week's request to speak to PA re: her treatment plans.  Again, no answer.   Did confirm with schedulers that patient is due for CT imaging in April which is currently being arranged.   Remain available should patient call back with questions or concerns.   Brynda Greathouse, MS RD PA-C

## 2020-03-12 ENCOUNTER — Other Ambulatory Visit (HOSPITAL_COMMUNITY): Payer: Self-pay | Admitting: Interventional Radiology

## 2020-03-12 DIAGNOSIS — I771 Stricture of artery: Secondary | ICD-10-CM

## 2020-03-19 ENCOUNTER — Telehealth (HOSPITAL_COMMUNITY): Payer: Self-pay

## 2020-03-19 NOTE — Telephone Encounter (Signed)
Called to schedule cta head/neck, no answer, left vm. AW 

## 2020-04-01 ENCOUNTER — Other Ambulatory Visit: Payer: Self-pay

## 2020-04-01 ENCOUNTER — Ambulatory Visit (HOSPITAL_COMMUNITY)
Admission: RE | Admit: 2020-04-01 | Discharge: 2020-04-01 | Disposition: A | Discharge status: Home / Self Care | Payer: Medicare Other | Source: Ambulatory Visit | Attending: Interventional Radiology | Admitting: Interventional Radiology

## 2020-04-01 ENCOUNTER — Other Ambulatory Visit (HOSPITAL_COMMUNITY): Payer: Self-pay | Admitting: Interventional Radiology

## 2020-04-01 DIAGNOSIS — I771 Stricture of artery: Secondary | ICD-10-CM | POA: Diagnosis present

## 2020-04-01 LAB — POCT I-STAT CREATININE: Creatinine, Ser: 0.7 mg/dL (ref 0.44–1.00)

## 2020-04-01 MED ORDER — IOHEXOL 350 MG/ML SOLN
75.0000 mL | Freq: Once | INTRAVENOUS | Status: AC | PRN
  Administered 2020-04-01: 75 mL via INTRAVENOUS

## 2020-04-03 ENCOUNTER — Ambulatory Visit (HOSPITAL_COMMUNITY)
Admission: RE | Admit: 2020-04-03 | Discharge: 2020-04-03 | Disposition: A | Discharge status: Home / Self Care | Payer: Medicare Other | Source: Ambulatory Visit | Attending: Interventional Radiology | Admitting: Interventional Radiology

## 2020-04-03 ENCOUNTER — Other Ambulatory Visit: Payer: Self-pay

## 2020-04-03 DIAGNOSIS — I771 Stricture of artery: Secondary | ICD-10-CM

## 2020-04-03 NOTE — Progress Notes (Addendum)
Chief Complaint: Patient was seen in consultation today for intra-cranial stenosis  Supervising Physician: Luanne Bras  Patient Status: [facility] - Out-pt  History of Present Illness: Shannon [name] is a 74 y.o. female history of symptomatic right MCA stenosis treated endovascularly in 12/25/18 with stent assisted angioplasty. Repeat angioplasty and rescue stenting of severe intra-stent stenosis 11/21/19.  She presents to [facility] clinic today for follow-up appointment.  She is accompanied by her husband today. She reports she has been doing fairly well at home since procedure.  She has kept meticulous records of her daily BPs as well as any intermittent symptoms.  She describes low energy with occasional dizziness that is associated with BP lower than her usual.  She did sustain one fall at home.  She was squatting to reach something on lowest pantry shelf and fell backwards- "It was like I just sat down."  Denies dizziness, lightheadness at the time of the incident.  Reports she lost her balance and needed assistance to get to her feet, but was steady the rest of the day.  This has not recurred.  She did recently add a second BP medication and has noticed her BP is lower on the weekend compared to week days.  She is more likely to describe dizziness on days when her BP is lower.  She has otherwise not noticed any new symptoms.  She denies headaches, blurry vision, trouble walking, or new weakness.    Past Medical History:  Diagnosis Date  . Anxiety   . 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 Arthritis   . Cancer (HCC)    skin - squamous cell  . Complication of anesthesia    " I had difficulty coming out of it"  . Dyspnea    with exertion  . Family history of adverse reaction to anesthesia    " My daughter has a problem coming ou to it"  . GERD (gastroesophageal reflux disease)   . Headache    migraines in the past  . Hypertension   . Hypothyroidism   . Stroke ([place]) 11/2018   some  weakness on right side   . Thyroid disease     Past Surgical History:  Procedure Laterality Date  . ABDOMINAL HYSTERECTOMY    . APPENDECTOMY    . CARDIAC CATHETERIZATION    . CARPAL TUNNEL RELEASE Bilateral   . COLONOSCOPY    . DILATION AND CURETTAGE OF UTERUS    . IR ANGIO INTRA EXTRACRAN SEL COM CAROTID INNOMINATE BILAT MOD SED  12/25/2018  . IR ANGIO INTRA EXTRACRAN SEL COM CAROTID INNOMINATE BILAT MOD SED  10/30/2019  . IR ANGIO VERTEBRAL SEL SUBCLAVIAN INNOMINATE UNI L MOD SED  12/25/2018  . IR ANGIO VERTEBRAL SEL SUBCLAVIAN INNOMINATE UNI L MOD SED  10/30/2019  . IR ANGIO VERTEBRAL SEL SUBCLAVIAN INNOMINATE UNI R MOD SED  01/04/2019  . IR ANGIO VERTEBRAL SEL VERTEBRAL UNI R MOD SED  12/25/2018  . IR ANGIO VERTEBRAL SEL VERTEBRAL UNI R MOD SED  10/30/2019  . IR CT HEAD LTD  01/04/2019  . IR CT HEAD LTD  11/21/2019  . IR INTRA CRAN STENT  01/04/2019  . IR INTRA CRAN STENT  11/21/2019  . IR US GUIDE VASC ACCESS RIGHT  12/25/2018  . IR US GUIDE VASC ACCESS RIGHT  11/02/2019  . OVARIAN CYST REMOVAL    . RADIOLOGY WITH ANESTHESIA N/A 01/04/2019   Procedure: RMCA angioplasty with possible stenting;  Surgeon: Luanne Bras, MD;  Location:  [place] [facility];  Service: Radiology;  Laterality: N/A;  . RADIOLOGY WITH ANESTHESIA N/A 11/21/2019   Procedure: IR WITH ANESTHESIA/ STENT PLACEMENT;  Surgeon: Luanne Bras, MD;  Location: [place];  Service: Radiology;  Laterality: N/A;    Allergies: Sulfa antibiotics, Mandol [cefamandole], and Sinutab non-drowsy max [pseudoephedrine-acetaminophen]  Medications: Prior to Admission medications   Medication Sig Start Date End Date Taking? Authorizing Provider  acetaminophen (TYLENOL) 325 MG tablet Take 650 mg by mouth every 6 (six) hours as needed for headache.    [provider]  Alum Hydroxide-Mag Carbonate (GAVISCON EXTRA STRENGTH) [phone] MG/10ML SUSP Take 5 mLs by mouth daily as needed (acid reflux).    [provider]  amLODipine  (NORVASC) 2.5 MG tablet Take 1 tablet (2.5 mg total) by mouth daily. 10/17/19 10/11/20  Buford Dresser, MD  aspirin EC 81 MG EC tablet Take 1 tablet (81 mg total) by mouth daily. 12/10/18   Mayo, Pete Pelt, MD  atorvastatin (LIPITOR) 40 MG tablet Take 1 tablet (40 mg total) by mouth daily at 6 PM. 12/09/18   Mayo, Pete Pelt, MD  busPIRone (BUSPAR) 5 MG tablet Take 5 mg by mouth daily as needed (anxiety).  10/12/19   [provider]  Calcium Carb-Cholecalciferol (CALCIUM 600 + D PO) Take 0.5-1 tablets by mouth See admin instructions. Take 1 tablet after breakfast, 1 tablet after lunch, and 0.5 tablet after dinner    [provider]  Carboxymethylcellulose Sodium (ARTIFICIAL TEARS OP) Place 1 drop into both eyes daily as needed (dry eyes).    [provider]  Coenzyme Q10 50 MG CAPS Take 50 mg by mouth daily.     [provider]  levothyroxine (SYNTHROID) 75 MCG tablet Take 75 mcg by mouth 4 (four) times a week. [name], Thurs, Sat    [provider]  levothyroxine Wilmer Floor, LEVOTHROID) 50 MCG tablet Take 50 mcg by mouth every Monday, Wednesday, and Friday.  04/24/12   [provider]  lisinopril (PRINIVIL,ZESTRIL) 10 MG tablet Take 10 mg by mouth daily.    [provider]  loratadine (CLARITIN) 10 MG tablet Take 10 mg by mouth daily.    [provider]  Multiple Vitamin (MULTIVITAMIN) capsule Take 1 capsule by mouth daily. Senior 04/24/12   [provider]  ticagrelor (BRILINTA) 90 MG TABS tablet Take 1 tablet (90 mg total) by mouth 2 (two) times daily. 12/27/18   Rehman, Areeg N, DO  vitamin E 400 UNIT capsule Take 400 Units by mouth every other day.     [provider]     Family History  Problem Relation Age of Onset  . CVA Mother   . Heart disease Mother   . Peripheral Artery Disease Father   . CVA Maternal Aunt     Social History   Socioeconomic History  . Marital status: Married     Spouse name: Not on file  . Number of children: Not on file  . Years of education: Not on file  . Highest education level: Not on file  Occupational History  . Occupation: retired  Tobacco Use  . Smoking status: Never Smoker  . Smokeless tobacco: Never Used  Substance and Sexual Activity  . Alcohol use: Never  . Drug use: Never  . Sexual activity: Yes  Other Topics Concern  . Not on file  Social History Narrative  . Not on file   Social Determinants of Health   Financial Resource Strain:   . Difficulty of Paying  Living Expenses:   Food Insecurity:   . Worried About Charity fundraiser in the Last Year:   . Arboriculturist in the Last Year:   Transportation Needs:   . Film/video editor (Medical):   Marland Kitchen Lack of Transportation (Non-Medical):   Physical Activity:   . Days of Exercise per Week:   . Minutes of Exercise per Session:   Stress:   . Feeling of Stress :   Social Connections:   . Frequency of Communication with Friends and Family:   . Frequency of Social Gatherings with Friends and Family:   . Attends Religious Services:   . Active Member of Clubs or Organizations:   . Attends Archivist Meetings:   Marland Kitchen Marital Status:      Review of Systems: A 12 point ROS discussed and pertinent positives are indicated in the HPI above.  All other systems are negative.  Review of Systems  Constitutional: Positive for fatigue. Negative for fever.  Respiratory: Negative for cough and shortness of breath.   Cardiovascular: Negative for chest pain.  Gastrointestinal: Negative for abdominal pain.  Musculoskeletal: Negative for back pain.  Neurological: Positive for dizziness and weakness. Negative for light-headedness and headaches.  Psychiatric/Behavioral: Negative for behavioral problems and confusion.    Vital Signs: There were no vitals taken for this visit.  Physical Exam Vitals and nursing note reviewed.  Constitutional:      Appearance: Normal  appearance.  HENT:     Mouth/Throat:     Mouth: Mucous membranes are moist.     Pharynx: Oropharynx is clear.  Pulmonary:     Effort: Pulmonary effort is normal. No respiratory distress.  Skin:    General: Skin is warm and dry.  Neurological:     General: No focal deficit present.     Mental Status: She is alert and oriented to person, place, and time. Mental status is at baseline.     Gait: Gait normal.  Psychiatric:        Mood and Affect: Mood normal.        Behavior: Behavior normal.        Thought Content: Thought content normal.        Judgment: Judgment normal.      Imaging: CT ANGIO HEAD W OR WO CONTRAST  Result Date: 04/01/2020 CLINICAL DATA:  74 year old female with history of symptomatic right MCA stenosis treated endovascularly in January 2020 with stent assisted angioplasty. Repeat angioplasty and rescue stenting of severe intra-stent stenosis of the right M1 in December. EXAM: CT ANGIOGRAPHY HEAD AND NECK TECHNIQUE: Multidetector CT imaging of the head and neck was performed using the standard protocol during bolus administration of intravenous contrast. Multiplanar CT image reconstructions and MIPs were obtained to evaluate the vascular anatomy. Carotid stenosis measurements (when applicable) are obtained utilizing NASCET criteria, using the distal internal carotid diameter as the denominator. CONTRAST:  46mL OMNIPAQUE IOHEXOL 350 MG/ML SOLN COMPARISON:  CTA 10/15/2019 and earlier. Subsequent brain MRI and intracranial MRA 11/27/2019 FINDINGS: CT HEAD Brain: Right MCA stenting has been extended proximally since the prior CTA. Stable cerebral volume. Mild patchy encephalomalacia in the right corona radiata and basal ganglia appears stable. No midline shift, ventriculomegaly, mass effect, evidence of mass lesion, intracranial hemorrhage or evidence of cortically based acute infarction. Calvarium and skull base: No acute osseous abnormality identified. Paranasal sinuses: Visualized  paranasal sinuses and mastoids are stable and generally well pneumatized. Orbits: Visualized orbits and scalp soft tissues are within  normal limits. CTA NECK Skeleton: No acute osseous abnormality identified. Upper chest: Stable, negative. Other neck: Stable, negative. Aortic arch: Stable aortic arch, three-vessel configuration and bulky plaque distal to the left subclavian. Right carotid system: Stable. No significant plaque or stenosis. Mildly beaded appearance of the vessel at the C2 level (series 16, image 13). Left carotid system: Stable. Mild soft plaque including at the left carotid bifurcation but no significant stenosis. Mildly beaded appearance of the vessel at the C1-C2 level. Vertebral arteries: Stable right vertebral artery with mild origin and proximal V1 plaque resulting in only mild stenosis. Stable left vertebral artery with moderate to severe proximal V1 stenosis beginning at the origin (series 12, image 168) and again in the V2 segment at C4-C5 (series 9, image 109). The vessel remains patent with stable size to the skull base. CTA HEAD Posterior circulation: Fairly codominant V4 segments are patent to the basilar without stenosis. Right PICA origin remains normal. Left AICA may be dominant. Stable basilar artery without stenosis. SCA and PCA origins remain normal. Posterior communicating arteries are diminutive or absent. Stable mild irregularity and stenoses at the right P1 P2 junction and left P2 segment. Anterior circulation: Both ICA siphons remain patent. The right is stable with mild calcified plaque and no significant stenosis. Stable left ICA siphon with mild to moderate supraclinoid segment stenosis due to calcified plaque. Patent carotid termini. Left MCA and ACA origins are stable with mild irregularity and stenosis. Stable left A1 mild irregularity and stenosis. Left MCA M1 segment, bifurcation, and left MCA branches are stable. The right ACA origin remains within normal limits.  Bilateral ACA branches are stable and within normal limits. Interval stenting from the distal right ICA through the right M1. Improved right MCA M1 enhancement (series 15, image 16 today) although the right MCA origin appears to remain stenotic (same image). Right MCA bifurcation remains patent. However, the appearance of right MCA branches has not improved (series 16, image 14 today). Venous sinuses: Patent, right transverse sinus arachnoid granulation (normal finding). Anatomic variants: None. Review of the MIP images confirms the above findings IMPRESSION: 1. Right MCA stenting extended proximally from the prior CTA. Improved right MCA M1 enhancement compared to 10/15/2019, but evidence of continued severe stenosis at the right MCA origin (series 15, image 16), and right MCA branch appearance has not improved. 2. Otherwise stable CTA head and neck since November, also notable for: - up to moderate stenosis of the Left ICA supraclinoid segment due to calcified plaque. - tandem moderate to severe stenoses of the Left Vertebral Artery in the V1 and V2 segments. - bilateral cervical ICA Fibromuscular Dysplasia (FMD). - mild intracranial stenoses of the Left A1, bilateral P1/P2. 3. Stable non contrast CT appearance of the brain. No acute intracranial abnormality. 4.  Aortic Atherosclerosis (ICD10-I70.0). Electronically Signed   By: Genevie Ann M.D.   On: 04/01/2020 14:05   CT ANGIO NECK W OR WO CONTRAST  Result Date: 04/01/2020 CLINICAL DATA:  74 year old female with history of symptomatic right MCA stenosis treated endovascularly in January 2020 with stent assisted angioplasty. Repeat angioplasty and rescue stenting of severe intra-stent stenosis of the right M1 in December. EXAM: CT ANGIOGRAPHY HEAD AND NECK TECHNIQUE: Multidetector CT imaging of the head and neck was performed using the standard protocol during bolus administration of intravenous contrast. Multiplanar CT image reconstructions and MIPs were obtained  to evaluate the vascular anatomy. Carotid stenosis measurements (when applicable) are obtained utilizing NASCET criteria, using the distal internal carotid  diameter as the denominator. CONTRAST:  49mL OMNIPAQUE IOHEXOL 350 MG/ML SOLN COMPARISON:  CTA 10/15/2019 and earlier. Subsequent brain MRI and intracranial MRA 11/27/2019 FINDINGS: CT HEAD Brain: Right MCA stenting has been extended proximally since the prior CTA. Stable cerebral volume. Mild patchy encephalomalacia in the right corona radiata and basal ganglia appears stable. No midline shift, ventriculomegaly, mass effect, evidence of mass lesion, intracranial hemorrhage or evidence of cortically based acute infarction. Calvarium and skull base: No acute osseous abnormality identified. Paranasal sinuses: Visualized paranasal sinuses and mastoids are stable and generally well pneumatized. Orbits: Visualized orbits and scalp soft tissues are within normal limits. CTA NECK Skeleton: No acute osseous abnormality identified. Upper chest: Stable, negative. Other neck: Stable, negative. Aortic arch: Stable aortic arch, three-vessel configuration and bulky plaque distal to the left subclavian. Right carotid system: Stable. No significant plaque or stenosis. Mildly beaded appearance of the vessel at the C2 level (series 16, image 13). Left carotid system: Stable. Mild soft plaque including at the left carotid bifurcation but no significant stenosis. Mildly beaded appearance of the vessel at the C1-C2 level. Vertebral arteries: Stable right vertebral artery with mild origin and proximal V1 plaque resulting in only mild stenosis. Stable left vertebral artery with moderate to severe proximal V1 stenosis beginning at the origin (series 12, image 168) and again in the V2 segment at C4-C5 (series 9, image 109). The vessel remains patent with stable size to the skull base. CTA HEAD Posterior circulation: Fairly codominant V4 segments are patent to the basilar without  stenosis. Right PICA origin remains normal. Left AICA may be dominant. Stable basilar artery without stenosis. SCA and PCA origins remain normal. Posterior communicating arteries are diminutive or absent. Stable mild irregularity and stenoses at the right P1 P2 junction and left P2 segment. Anterior circulation: Both ICA siphons remain patent. The right is stable with mild calcified plaque and no significant stenosis. Stable left ICA siphon with mild to moderate supraclinoid segment stenosis due to calcified plaque. Patent carotid termini. Left MCA and ACA origins are stable with mild irregularity and stenosis. Stable left A1 mild irregularity and stenosis. Left MCA M1 segment, bifurcation, and left MCA branches are stable. The right ACA origin remains within normal limits. Bilateral ACA branches are stable and within normal limits. Interval stenting from the distal right ICA through the right M1. Improved right MCA M1 enhancement (series 15, image 16 today) although the right MCA origin appears to remain stenotic (same image). Right MCA bifurcation remains patent. However, the appearance of right MCA branches has not improved (series 16, image 14 today). Venous sinuses: Patent, right transverse sinus arachnoid granulation (normal finding). Anatomic variants: None. Review of the MIP images confirms the above findings IMPRESSION: 1. Right MCA stenting extended proximally from the prior CTA. Improved right MCA M1 enhancement compared to 10/15/2019, but evidence of continued severe stenosis at the right MCA origin (series 15, image 16), and right MCA branch appearance has not improved. 2. Otherwise stable CTA head and neck since November, also notable for: - up to moderate stenosis of the Left ICA supraclinoid segment due to calcified plaque. - tandem moderate to severe stenoses of the Left Vertebral Artery in the V1 and V2 segments. - bilateral cervical ICA Fibromuscular Dysplasia (FMD). - mild intracranial stenoses  of the Left A1, bilateral P1/P2. 3. Stable non contrast CT appearance of the brain. No acute intracranial abnormality. 4.  Aortic Atherosclerosis (ICD10-I70.0). Electronically Signed   By: Genevie Ann M.D.   On:  04/01/2020 14:05    Labs:  CBC: Recent Labs    09/30/19 1833 10/30/19 0631 11/21/19 0635 11/22/19 0500  WBC 4.0 4.4 3.7* 6.2  HGB 13.2 14.6 13.3 11.1*  HCT 39.5 43.6 39.9 32.5*  PLT 185 199 187 173    COAGS: Recent Labs    10/30/19 0631 11/21/19 0635 11/21/19 1400  INR 1.0 0.9  --   APTT  --  30 >200*    BMP: Recent Labs    09/30/19 1833 09/30/19 1833 10/30/19 0631 11/21/19 0635 11/22/19 0500 04/01/20 0858  NA 140  --  142 141 137  --   K 4.1  --  4.2 4.0 3.0*  --   CL 107  --  104 105 108  --   CO2 23  --  26 25 21*  --   GLUCOSE 107*  --  93 89 89  --   BUN 18  --  19 15 6*  --   CALCIUM 9.5  --  9.8 9.9 8.0*  --   CREATININE 0.79   < > 0.71 0.74 0.59 0.70  GFRNONAA >60  --  >60 >60 >60  --   GFRAA >60  --  >60 >60 >60  --    < > = values in this interval not displayed.    LIVER FUNCTION TESTS: No results for input(s): BILITOT, AST, ALT, ALKPHOS, PROT, ALBUMIN in the last 8760 hours.  TUMOR MARKERS: No results for input(s): AFPTM, CEA, CA199, CHROMGRNA in the last 8760 hours.  Assessment and Plan: Severe R MCA intra-stent stenosis s/p multiple angioplasties with rescue stenting 11/20/20 Mrs. Knoff presents to [facility] clinic today for follow-up of her recent procedure.  She has been doing well at home with the exception of occasional dizziness.  She has noticed a correlation between her dizzy spells and her BP trends.  She was recently seen by a new cardiologist- Dr. Harrell Gave- and placed on a second BP medication. She has plans for follow-up with her cardiologist in June.  She does not describe any other symptoms that may be associated with her intra-cranial stenosis at this time.  Her imaging is reviewed by Dr. Estanislado Pandy and discussed with the patient.   He recommends that her BP/BP medication be addressed to rule out contribution to her symptoms given her otherwise stable imaging.  Plan for follow-up in 3 months to allow for any medication adjustments and tracking of symptoms.  At that time will reassess for symptoms and need for further intervention. If she is doing well at that time, then likely can continue with her expected imaging at 6 months post procedure.  Patient and husband are agreeable to plan.  She should continue her Brilinta 90 mg BID and aspirin 81 mg daily at this time.    Thank you for this interesting consult.  I greatly enjoyed meeting Doral Njoku and look forward to participating in their care.  A copy of this report was sent to the requesting provider on this date.  Electronically Signed: Docia Barrier, PA 04/03/2020, 11:40 AM   I spent a total of    25 Minutes in face to face in clinical consultation, greater than 50% of which was counseling/coordinating care for intra-cranial stenosis.

## 2020-04-09 ENCOUNTER — Other Ambulatory Visit: Payer: Self-pay

## 2020-04-09 ENCOUNTER — Encounter: Payer: Self-pay | Admitting: Cardiology

## 2020-04-09 ENCOUNTER — Ambulatory Visit (INDEPENDENT_AMBULATORY_CARE_PROVIDER_SITE_OTHER): Payer: Medicare Other | Admitting: Cardiology

## 2020-04-09 VITALS — BP 148/74 | HR 77 | Temp 97.3°F | Ht 65.0 in | Wt 115.0 lb

## 2020-04-09 DIAGNOSIS — I10 Essential (primary) hypertension: Secondary | ICD-10-CM | POA: Diagnosis not present

## 2020-04-09 DIAGNOSIS — Z8673 Personal history of transient ischemic attack (TIA), and cerebral infarction without residual deficits: Secondary | ICD-10-CM | POA: Diagnosis not present

## 2020-04-09 DIAGNOSIS — R072 Precordial pain: Secondary | ICD-10-CM

## 2020-04-09 DIAGNOSIS — Z7189 Other specified counseling: Secondary | ICD-10-CM

## 2020-04-09 NOTE — Patient Instructions (Signed)
Medication Instructions:  Take Amlodipine 2.5 mg in the morning and Lisinopril 10 mg at night  *If you need a refill on your cardiac medications before your next appointment, please call your pharmacy*   Lab Work: None  Testing/Procedures: None   Follow-Up: At [facility], you and your health needs are our priority.  As part of our continuing mission to provide you with exceptional heart care, we have created designated Provider Care Teams.  These Care Teams include your primary Cardiologist (physician) and Advanced Practice Providers (APPs -  Physician Assistants and Nurse Practitioners) who all work together to provide you with the care you need, when you need it.  We recommend signing up for the patient portal called "MyChart".  Sign up information is provided on this After Visit Summary.  MyChart is used to connect with patients for Virtual Visits (Telemedicine).  Patients are able to view lab/test results, encounter notes, upcoming appointments, etc.  Non-urgent messages can be sent to your provider as well.   To learn more about what you can do with MyChart, go to [email or web address].    Your next appointment:   4 week(s)  The format for your next appointment:   In Person  Provider:   Buford Dresser, MD

## 2020-04-09 NOTE — Progress Notes (Signed)
Cardiology Office Note:    Date:  04/09/2020   ID:  Shannon [name], DOB 18-Nov-1946, MRN [id number]  PCP:  Donalynn Furlong, MD  Cardiologist:  Buford Dresser, MD  Referring MD: Donalynn Furlong, MD   Cc: follow up  History of Present Illness:    Shannon [name] is a 74 y.o. female with a hx of hypertension, CVA who is seen for follow up today. She was seen in the ER on 09/30/19 for concern for stroke, elevated blood pressure. 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  Today: Reports this AM she woke up with mild chest pain. First occurrence in a long time. Sharp, center of chest, improved with repositioning in the bed. Very brief. No change in her baseline breathing with it, no nausea/diaphoresis. No clear triggers.  Always short of breath with walking, unchanged. Feels very fatigued easily. Feels tired every morning, unchanged.   Discussed blood pressure today. Brings logs today  03/07/20: very fatigued, BP 97/58, HR 79. Rechecked <2 hours later, 117/62, HR 79. No change in symptoms between the two readings. No dizzyness/lightheadedness.  Had another episode of low BP 03/22/20 98/63, pulse 81. That afternoon, was 111/64, HR 100. No specific symptoms, but generally feels tired every day.   Has had other similar episodes of severe fatigue, but BP elevated at 148/72. After walking a mile with her dog, BP 127/69.   Has had no syncope. Very rare dizzy spells, but these haven't correlated with a specific activity or blood pressure range.  Highest blood pressure reading at home 03/09/20, 148/72.   Has a lot of stress at home, which she thinks is why her BP is elevated today. Enjoys walking, has been doing this less recently but tries to walk every day.  Discussed options, risk of too aggressive vs not aggressive enough BP control. She is taking both medications in the AM currently. After shared decision making,  will keep amlodipine in the AM and change lisinopril to PM.   Past Medical History:  Diagnosis Date  . Anxiety   . 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 Arthritis   . Cancer (HCC)    skin - squamous cell  . Complication of anesthesia    " I had difficulty coming out of it"  . Dyspnea    with exertion  . Family history of adverse reaction to anesthesia    " My daughter has a problem coming ou to it"  . GERD (gastroesophageal reflux disease)   . Headache    migraines in the past  . Hypertension   . Hypothyroidism   . Stroke ([place]) 11/2018   some weakness on right side   . Thyroid disease     Past Surgical History:  Procedure Laterality Date  . ABDOMINAL HYSTERECTOMY    . APPENDECTOMY    . CARDIAC CATHETERIZATION    . CARPAL TUNNEL RELEASE Bilateral   . COLONOSCOPY    . DILATION AND CURETTAGE OF UTERUS    . IR ANGIO INTRA EXTRACRAN SEL COM CAROTID INNOMINATE BILAT MOD SED  12/25/2018  . IR ANGIO INTRA EXTRACRAN SEL COM CAROTID INNOMINATE BILAT MOD SED  10/30/2019  . IR ANGIO VERTEBRAL SEL SUBCLAVIAN INNOMINATE UNI L MOD SED  12/25/2018  . IR ANGIO VERTEBRAL SEL SUBCLAVIAN INNOMINATE UNI L MOD SED  10/30/2019  . IR ANGIO VERTEBRAL SEL SUBCLAVIAN INNOMINATE UNI R MOD SED  01/04/2019  . IR ANGIO VERTEBRAL SEL VERTEBRAL UNI  R MOD SED  12/25/2018  . IR ANGIO VERTEBRAL SEL VERTEBRAL UNI R MOD SED  10/30/2019  . IR CT HEAD LTD  01/04/2019  . IR CT HEAD LTD  11/21/2019  . IR INTRA CRAN STENT  01/04/2019  . IR INTRA CRAN STENT  11/21/2019  . IR US GUIDE VASC ACCESS RIGHT  12/25/2018  . IR US GUIDE VASC ACCESS RIGHT  11/02/2019  . OVARIAN CYST REMOVAL    . RADIOLOGY WITH ANESTHESIA N/A 01/04/2019   Procedure: RMCA angioplasty with possible stenting;  Surgeon: Luanne Bras, MD;  Location: [place];  Service: Radiology;  Laterality: N/A;  . RADIOLOGY WITH ANESTHESIA N/A 11/21/2019   Procedure: IR WITH ANESTHESIA/ STENT PLACEMENT;  Surgeon: Luanne Bras, MD;  Location: [place];   Service: Radiology;  Laterality: N/A;    Current Medications: Current Outpatient Medications on File Prior to Visit  Medication Sig  . acetaminophen (TYLENOL) 325 MG tablet Take 650 mg by mouth every 6 (six) hours as needed for headache.  . Alum Hydroxide-Mag Carbonate (GAVISCON EXTRA STRENGTH) 508-475 MG/10ML SUSP Take 5 mLs by mouth daily as needed (acid reflux).  Marland Kitchen amLODipine (NORVASC) 2.5 MG tablet Take 1 tablet (2.5 mg total) by mouth daily.  Marland Kitchen aspirin EC 81 MG EC tablet Take 1 tablet (81 mg total) by mouth daily.  Marland Kitchen atorvastatin (LIPITOR) 40 MG tablet Take 1 tablet (40 mg total) by mouth daily at 6 PM.  . busPIRone (BUSPAR) 5 MG tablet Take 5 mg by mouth daily as needed (anxiety).   . Calcium Carb-Cholecalciferol (CALCIUM 600 + D PO) Take 0.5-1 tablets by mouth See admin instructions. Take 1 tablet after breakfast, 1 tablet after lunch, and 0.5 tablet after dinner  . Carboxymethylcellulose Sodium (ARTIFICIAL TEARS OP) Place 1 drop into both eyes daily as needed (dry eyes).  . Coenzyme Q10 50 MG CAPS Take 50 mg by mouth daily.   Marland Kitchen levothyroxine (SYNTHROID) 75 MCG tablet Take 75 mcg by mouth 4 (four) times a week. [name], Thurs, Sat  . levothyroxine (SYNTHROID, LEVOTHROID) 50 MCG tablet Take 50 mcg by mouth every Monday, Wednesday, and Friday.   Marland Kitchen lisinopril (PRINIVIL,ZESTRIL) 10 MG tablet Take 10 mg by mouth daily.  Marland Kitchen loratadine (CLARITIN) 10 MG tablet Take 10 mg by mouth daily.  . Multiple Vitamin (MULTIVITAMIN) capsule Take 1 capsule by mouth daily. Senior  . ticagrelor (BRILINTA) 90 MG TABS tablet Take 1 tablet (90 mg total) by mouth 2 (two) times daily.  . vitamin E 400 UNIT capsule Take 400 Units by mouth every other day.    No current facility-administered medications on file prior to visit.     Allergies:   Sulfa antibiotics, Mandol [cefamandole], and Sinutab non-drowsy max [pseudoephedrine-acetaminophen]   Social History   Tobacco Use  . Smoking status: Never Smoker  .  Smokeless tobacco: Never Used  Substance Use Topics  . Alcohol use: Never  . Drug use: Never    Family History: family history includes CVA in her maternal aunt and mother; Heart disease in her mother; Peripheral Artery Disease in her father.  ROS:   Please see the history of present illness.  Additional pertinent ROS otherwise unremarkable.  EKGs/Labs/Other Studies Reviewed:    The following studies were reviewed today: Echo 12/09/2018 - Left ventricle: The cavity size was normal. Systolic function was   normal. The estimated ejection fraction was in the range of 60%   to 65%. Wall motion was normal; there were no regional wall  motion abnormalities. Left ventricular diastolic function   parameters were normal. - Left atrium: The atrium was normal in size. - Right ventricle: Systolic function was normal. - Atrial septum: No defect or patent foramen ovale was identified.   Echo contrast study showed no right-to-left atrial level shunt,   at baseline or with provocation. - Pulmonary arteries: Systolic pressure was within the normal   range.  EKG:  EKG is personally reviewed.  The ekg ordered today demonstrates NSR with sinus arrhythmia.  Recent Labs: 11/22/2019: BUN 6; Hemoglobin 11.1; Platelets 173; Potassium 3.0; Sodium 137 04/01/2020: Creatinine, Ser 0.70  Recent Lipid Panel    Component Value Date/Time   CHOL 199 12/09/2018 0352   TRIG 110 12/09/2018 0352   HDL 45 12/09/2018 0352   CHOLHDL 4.4 12/09/2018 0352   VLDL 22 12/09/2018 0352   LDLCALC 132 (H) 12/09/2018 0352    Physical Exam:    VS:  BP (!) 148/74 (BP Location: Left Arm, Patient Position: Sitting, Cuff Size: Normal)   Pulse 77   Temp (!) 97.3 F (36.3 C)   Ht 5\' 5"  (1.651 m)   Wt 115 lb (52.2 kg)   BMI 19.14 kg/m     Wt Readings from Last 3 Encounters:  04/09/20 115 lb (52.2 kg)  12/05/19 112 lb (50.8 kg)  11/21/19 117 lb (53.1 kg)    GEN: Well nourished, well developed in no acute  distress HEENT: Normal, moist mucous membranes NECK: No JVD CARDIAC: regular rhythm, normal S1 and S2, no rubs or gallops. No murmur. VASCULAR: Radial and DP pulses 2+ bilaterally. No carotid bruits RESPIRATORY:  Clear to auscultation without rales, wheezing or rhonchi  ABDOMEN: Soft, non-tender, non-distended MUSCULOSKELETAL:  Ambulates independently SKIN: Warm and dry, no edema NEUROLOGIC:  Alert and oriented x 3. No focal neuro deficits noted. PSYCHIATRIC:  Normal affect   ASSESSMENT:    1. Precordial pain   2. Essential hypertension   3. History of CVA (cerebrovascular accident)   4. Cardiac risk counseling   5. Counseling on health promotion and disease prevention    PLAN:    Chest pain: sharp, brief. Atypical for cardiac pain -counseled on red flag warning signs that need immediate medical attention  Hypertension: labile readings. Has several lows, but occasionally over goal of 130/80. Has nonspecific symptoms with both low and high readings -continue amlodipine 2.5 mg daily -continue lisinopril 10 mg daily.  -Discussed options, risk of too aggressive vs not aggressive enough BP control. She is taking both medications in the AM currently. After shared decision making, will keep amlodipine in the AM and change lisinopril to PM. -reviewed home BP checking -instructed on red flag warning signs that need immediate medical attention -would avoid beta blockers in the future given her fatigue.  History of CVA, with need for secondary prevention: -on aspirin, ticagrelor. Ticagrelor duration per interventional neurology -on high intensity atorvastatin -echo 12/09/18 was normal, negative bubble study  Cardiac risk counseling and prevention recommendations: -recommend heart healthy/Mediterranean diet, with whole grains, fruits, vegetable, fish, lean meats, nuts, and olive oil. Limit salt. -recommend moderate walking, 3-5 times/week for 30-50 minutes each session. Aim for at least  150 minutes.week. Goal should be pace of 3 miles/hours, or walking 1.5 miles in 30 minutes -recommend avoidance of tobacco products. Avoid excess alcohol.  Plan for follow up: 4 weeks to monitor symptoms and BP  Medication Adjustments/Labs and Tests Ordered: Current medicines are reviewed at length with the patient today.  Concerns regarding medicines are  outlined above.  Orders Placed This Encounter  Procedures  . EKG 12-Lead   No orders of the defined types were placed in this encounter.   Patient Instructions  Medication Instructions:  Take Amlodipine 2.5 mg in the morning and Lisinopril 10 mg at night  *If you need a refill on your cardiac medications before your next appointment, please call your pharmacy*   Lab Work: None  Testing/Procedures: None   Follow-Up: At [facility], you and your health needs are our priority.  As part of our continuing mission to provide you with exceptional heart care, we have created designated Provider Care Teams.  These Care Teams include your primary Cardiologist (physician) and Advanced Practice Providers (APPs -  Physician Assistants and Nurse Practitioners) who all work together to provide you with the care you need, when you need it.  We recommend signing up for the patient portal called "MyChart".  Sign up information is provided on this After Visit Summary.  MyChart is used to connect with patients for Virtual Visits (Telemedicine).  Patients are able to view lab/test results, encounter notes, upcoming appointments, etc.  Non-urgent messages can be sent to your provider as well.   To learn more about what you can do with MyChart, go to [email or web address].    Your next appointment:   4 week(s)  The format for your next appointment:   In Person  Provider:   Buford Dresser, MD      Signed, Buford Dresser, MD PhD 04/09/2020 2:29 PM    [place]

## 2020-04-10 ENCOUNTER — Ambulatory Visit (HOSPITAL_COMMUNITY): Payer: Medicare Other

## 2020-04-29 ENCOUNTER — Ambulatory Visit (INDEPENDENT_AMBULATORY_CARE_PROVIDER_SITE_OTHER): Payer: Medicare Other | Admitting: Adult Health

## 2020-04-29 ENCOUNTER — Encounter: Payer: Self-pay | Admitting: Adult Health

## 2020-04-29 ENCOUNTER — Other Ambulatory Visit: Payer: Self-pay

## 2020-04-29 VITALS — BP 122/78 | HR 87 | Ht 65.0 in | Wt 116.0 lb

## 2020-04-29 DIAGNOSIS — I10 Essential (primary) hypertension: Secondary | ICD-10-CM | POA: Diagnosis not present

## 2020-04-29 DIAGNOSIS — Z8673 Personal history of transient ischemic attack (TIA), and cerebral infarction without residual deficits: Secondary | ICD-10-CM | POA: Diagnosis not present

## 2020-04-29 DIAGNOSIS — T733XXS Exhaustion due to excessive exertion, sequela: Secondary | ICD-10-CM

## 2020-04-29 DIAGNOSIS — E785 Hyperlipidemia, unspecified: Secondary | ICD-10-CM

## 2020-04-29 DIAGNOSIS — I6601 Occlusion and stenosis of right middle cerebral artery: Secondary | ICD-10-CM

## 2020-04-29 NOTE — Progress Notes (Signed)
[facility] [address] [place]. [place] [address] [phone]       OFFICE FOLLOW UP VISIT NOTE  [name] Date of Birth:  [birth year] Medical Record Number:  [id number]   Referring MD: Rosalin Hawking  Reason for Referral: Stroke  Chief complaint: Chief Complaint  Patient presents with  . Follow-up    vertigo, rm 9, alone, pt reports no episodes since march       HPI:  Today, 04/29/2020, Shannon [name] returns for 36-month follow-up with history of right basal ganglia infarct in 12/2018 s/p right M1 angioplasty and stenting with repeat angioplasty and rescue stenting of severe IntraStent stenosis 11/21/2019 with Dr. Estanislado Pandy with continuation of aspirin 81 mg daily and Brilinta 90 mg twice daily.  Follow-up with NIR around 06/2020 for repeat imaging.  She has been doing well since that time without new or reoccurring stroke/TIA symptoms.  Previously discussed dizziness episodes have been correlated with lower blood pressure readings.  Routine follow-up with cardiology with recent adjustments and typically range 100-130/60-70.  She has not had any recurrent dizziness episodes since March.  Blood pressure today initially 150/72 and on recheck 122/78.  Her main concern today is in regards to ongoing fatigue and decreased stamina as prior to her stroke, very active walking at least 2 miles daily.  She was able to slowly return to activity level after her stroke in 12/2018 but has had great difficulty since recent procedure in December.  Occasional shortness of breath with increased exertion such as walking uphill or climbing stairs.  Reports of occasional insomnia typically with increased anxiety regarding next day activities but otherwise sleeps without difficulty.  Does typically take daytime nap.  She has not previously underwent sleep study.  Will become mildly anxious or irritated depending on certain situation but otherwise denies depression or consistent anxiety.  No further  concerns at this time.      History provided for reference purposes only Update 10/29/2019 Dr. Leonie Man : She returns for follow-up after last visit with me 8 months ago.  She has not had any recurrent TIA or stroke symptoms.  She remains on aspirin and Brilinta but does complain of easy bruising and bleeding.  In fact she hit her shin a month ago and had lot of subcutaneous ecchymosis and bruising.  She had a couple of episodes which she wants to clarify and not related to strokes.  On 02/15/2019 while they were driving back from [facility] she had transient vertigo which lasted few seconds.  She later had back to the feeling passed.  She blames this on driving over winding roads.  On April 2 she had 2 brief episodes of numbness on the tip of nose which lasted only few seconds and resolved.  She also noticed an indentation in the right cheek in August which is still there.  In September she had a brief episode of feeling dizzy off balance and into the left which also did not last long.  She recently had a follow-up CT angiogram of the brain and neck done on 10/15/2019 with Dr. Estanislado Pandy which showed patent right MCA stent but there appears to be diminutive flow distal to the stent and Dr. Estanislado Pandy plans to do a diagnostic cerebral catheter angiogram which is scheduled for tomorrow.  Patient is still on both aspirin and Brilinta and I advised her to discuss with Dr. Estanislado Pandy tomorrow if she can needs both and can come off one of them.  She remains on Lipitor  which is tolerating well without side effects.  She states she did have lipid profile checked few weeks ago by primary physician but I do not have access to those labs in satisfactory.  Her blood pressure had been elevated a little bit recently and she saw her cardiologist but today it seems to be doing fine at 132/72.  She has anxiety about the procedure tomorrow but does have a prescription for of BuSpar and advised her to take it prior to the  angiogram.  Initial Consult 02/07/2019 Dr. Leonie Man : Shannon [name] is a 74 year old Caucasian lady seen today for initial office consultation visit following a stroke.  She is accompanied by her husband.  History is obtained from them and review of electronic medical records.  I personally reviewed imaging films in PACS.  Shannon [name] has a past medical history of hypertension and silent left basal ganglia infarct who was initially seen on 12/08/2018 for transient right face weakness which resolved.  At that time she was found to have high-grade right middle cerebral artery stenosis as well as acute right basal ganglia infarct.  She was also found to have moderate left supraclinoid ICA narrowing and remote age left basal ganglia and small right frontal cortical infarcts.  She was discharged on aspirin and Plavix but returned on 12/24/2018 as a code stroke for evaluation for right gaze deviation and left hemiplegia.  Her blood pressure was found to be 64 systolic and after giving IV fluids it improved to 123456 systolic.  She rapidly improved and return back to her baseline.  MRI showed right basal ganglia infarct and severe right M1 stenosis and moderate atherosclerotic changes.  Diagnostic cerebral catheter angiogram performed by Dr. Estanislado Pandy on 12/25/2018 showed severe high-grade stenosis of the right middle cerebral artery in the distal M1 segment with 50% stenosis of the left internal carotid artery supraclinoid segment, left middle cerebral artery M1 segment and to a lesser degree left anterior cerebral artery in the A1 segment.  There was also 50% stenosis of dominant right vertebral artery at its origin.  The patient was discharged home with plans for elective angioplasty and stenting in 2 weeks.  She will return for this and underwent right M1 angioplasty and stenting.  Postprocedure she had left facial droop and transient worsening.  MRI scan of the brain was obtained which showed slight extension of the basal ganglia  infarct.  Patient was eventually discharged home and has done well.  She remains on aspirin and Brilinta which is tolerating well.  She states the facial weakness has resolved.  She still has some heaviness in in the left upper extremity and diminished fine motor skills.  She feels her stamina is not the same and she gets tired easily.  She has been compliant with all her medications but feels her blood pressure has been quite fluctuating.  She is keeping a daily blood pressure log but her primary care physician plans to check ambulatory blood pressure monitor.  She is tolerating Lipitor well without muscle aches and pains.  He has no problems with gait and balance.  She has a lot of anxiety from her condition and has not been returning to her baseline despite physically doing well.     ROS:   14 system review of systems is positive for fatigue, insomnia, anxiety, imbalance and all other systems negative  PMH:  Past Medical History:  Diagnosis Date  . Anxiety   . 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  Arthritis   . Cancer (HCC)    skin - squamous cell  . Complication of anesthesia    " I had difficulty coming out of it"  . Dyspnea    with exertion  . Family history of adverse reaction to anesthesia    " My daughter has a problem coming ou to it"  . GERD (gastroesophageal reflux disease)   . Headache    migraines in the past  . Hypertension   . Hypothyroidism   . Stroke ([place]) 11/2018   some weakness on right side   . Thyroid disease     Social History:  Social History   Socioeconomic History  . Marital status: Married    Spouse name: Not on file  . Number of children: Not on file  . Years of education: Not on file  . Highest education level: Not on file  Occupational History  . Occupation: retired  Tobacco Use  . Smoking status: Never Smoker  . Smokeless tobacco: Never Used  Substance and Sexual Activity  . Alcohol use: Never  . Drug use: Never  . Sexual activity: Yes   Other Topics Concern  . Not on file  Social History Narrative  . Not on file   Social Determinants of Health   Financial Resource Strain:   . Difficulty of Paying Living Expenses:   Food Insecurity:   . Worried About Charity fundraiser in the Last Year:   . Arboriculturist in the Last Year:   Transportation Needs:   . Film/video editor (Medical):   Marland Kitchen Lack of Transportation (Non-Medical):   Physical Activity:   . Days of Exercise per Week:   . Minutes of Exercise per Session:   Stress:   . Feeling of Stress :   Social Connections:   . Frequency of Communication with Friends and Family:   . Frequency of Social Gatherings with Friends and Family:   . Attends Religious Services:   . Active Member of Clubs or Organizations:   . Attends Archivist Meetings:   Marland Kitchen Marital Status:   Intimate Partner Violence:   . Fear of Current or Ex-Partner:   . Emotionally Abused:   Marland Kitchen Physically Abused:   . Sexually Abused:     Medications:   Current Outpatient Medications on File Prior to Visit  Medication Sig Dispense Refill  . acetaminophen (TYLENOL) 325 MG tablet Take 650 mg by mouth every 6 (six) hours as needed for headache.    . Alum Hydroxide-Mag Carbonate (GAVISCON EXTRA STRENGTH) 508-475 MG/10ML SUSP Take 5 mLs by mouth daily as needed (acid reflux).    Marland Kitchen amLODipine (NORVASC) 2.5 MG tablet Take 1 tablet (2.5 mg total) by mouth daily. 30 tablet 11  . aspirin EC 81 MG EC tablet Take 1 tablet (81 mg total) by mouth daily. 30 tablet 0  . atorvastatin (LIPITOR) 40 MG tablet Take 1 tablet (40 mg total) by mouth daily at 6 PM. 30 tablet 0  . busPIRone (BUSPAR) 5 MG tablet Take 5 mg by mouth daily as needed (anxiety).     . Calcium Carb-Cholecalciferol (CALCIUM 600 + D PO) Take 0.5-1 tablets by mouth See admin instructions. Take 1 tablet after breakfast, 1 tablet after lunch, and 0.5 tablet after dinner    . Carboxymethylcellulose Sodium (ARTIFICIAL TEARS OP) Place 1 drop into  both eyes daily as needed (dry eyes).    . Coenzyme Q10 50 MG CAPS Take 50 mg by mouth daily.     Marland Kitchen  levothyroxine (SYNTHROID) 75 MCG tablet Take 75 mcg by mouth 4 (four) times a week. [name], Thurs, Sat    . levothyroxine (SYNTHROID, LEVOTHROID) 50 MCG tablet Take 50 mcg by mouth every Monday, Wednesday, and Friday.     Marland Kitchen lisinopril (PRINIVIL,ZESTRIL) 10 MG tablet Take 10 mg by mouth daily.    Marland Kitchen loratadine (CLARITIN) 10 MG tablet Take 10 mg by mouth daily.    . Multiple Vitamin (MULTIVITAMIN) capsule Take 1 capsule by mouth daily. Senior    . ticagrelor (BRILINTA) 90 MG TABS tablet Take 1 tablet (90 mg total) by mouth 2 (two) times daily. 60 tablet 0  . vitamin E 400 UNIT capsule Take 400 Units by mouth every other day.      No current facility-administered medications on file prior to visit.    Allergies:   Allergies  Allergen Reactions  . Sulfa Antibiotics Other (See Comments)    Tongue turned black  . Mandol [Cefamandole] Rash  . Sinutab Non-Drowsy Max [Pseudoephedrine-Acetaminophen] Palpitations     Vitals Today's Vitals   04/29/20 0927  BP: (!) 150/72  Pulse: 87  Weight: 116 lb (52.6 kg)  Height: 5\' 5"  (1.651 m)   Body mass index is 19.3 kg/m.   Physical Exam General: Frail elderly Caucasian lady, pleasant elderly Caucasian female, seated, in no evident distress Head: head normocephalic and atraumatic.   Neck: supple with no carotid or supraclavicular bruits Cardiovascular: regular rate and rhythm, no murmurs Musculoskeletal: no deformity Skin:  no rash/petichiae Vascular:  Normal pulses all extremities  Neurologic Exam Mental Status: Awake and fully alert.  Fluent speech and language.  Oriented to place and time. Recent and remote memory intact. Attention span, concentration and fund of knowledge appropriate. Mood and affect appropriate.  Cranial Nerves: Pupils equal, briskly reactive to light. Extraocular movements full without nystagmus. Visual fields full to  confrontation. Hearing intact. Facial sensation intact.  Mild left lower facial asymmetry., tongue, palate moves normally and symmetrically.  Motor: Normal bulk and tone. Normal strength in all tested extremity muscles except decreased left hip flexor weakness Sensory.: intact to touch , pinprick , position and vibratory sensation.  Coordination: Rapid alternating movements normal in all extremities. Finger-to-nose and heel-to-shin performed accurately bilaterally.   Gait and Station: Arises from chair without difficulty. Stance is normal. Gait demonstrates normal stride length and balance . Able to heel, toe and tandem walk with mild  difficulty.  Reflexes: 1+ and symmetric. Toes downgoing.       ASSESSMENT: 74 year old Caucasian lady with right basal ganglia stroke and symptomatic right middle cerebral artery stenosis in January 2020 status post elective angioplasty and stenting with good clinical results.  Residual deficits of mild left lower facial weakness and left hip flexor weakness.  On 11/21/2019, repeat angioplasty and stenting of severe preocclusive IntraStent stenosis of right MCA M1 segment by Dr. Estanislado Pandy without complication.  Vascular risk factors of hypertension, hyperlipidemia and intracranial atherosclerosis.  Greatest concern is regards to ongoing fatigue and decreased activity tolerance    PLAN: -Continuation of aspirin 81 mg daily, Brilinta 90 mg twice daily and atorvastatin for secondary stroke prevention -Continue to follow with NIR around 06/2020 with repeat imaging for surveillance monitoring and ongoing duration of Brilinta.  Also recommended speaking to NIR regarding possible side effects of Brilinta causing decreased activity tolerance and shortness of breath -Continue to follow with cardiology for chronic conditions as well as management of blood pressure.  Also advised to discuss with cardiology regarding possible medication  side effects or possible fluctuation of  blood pressure contributing to symptoms -Ongoing fatigue and decreased activity tolerance likely multifactorial including deconditioning, possible medication side effects, possible underlying sleep apnea, questionable underlying anxiety/depression although patient denies and poststroke fatigue -Recommended evaluation for sleep apnea but she declines interest at this time.  Discussion regarding increased risks of cardiovascular and stroke and she will call office if interested in pursuing in the future -Recommend participation in outpatient PT to help with activity tolerance but declines interest at this time -maintains strict control of blood pressure with goal below 130/90, lipids with LDL cholesterol goal below 70 mg percent and diabetes with hemoglobin A1c goal below 6.5%.  She was encouraged to eat a healthy diet with lots of fruits, vegetables, cereals and whole grains and to be active.     Follow-up in 6 months or call earlier if needed   I spent 40 minutes of face-to-face and non-face-to-face time with patient.  This included previsit chart review, lab review, study review, order entry, electronic health record documentation, patient education regarding history of stroke, repeat procedure with NIR, ongoing residual stroke deficits, fatigue, importance of managing stroke risk factors and answered all questions to patient satisfaction    Frann Rider, AGNP-BC  [facility] [address] [place], [place] [address]  Phone [phone] Fax [phone] Note: This document was prepared with digital dictation and possible smart phrase technology. Any transcriptional errors that result from this process are unintentional.

## 2020-04-29 NOTE — Patient Instructions (Addendum)
Continue aspirin 81 mg daily and Brilinta (ticagrelor) 90 mg bid  and atorvastatin for secondary stroke prevention  Continue to follow up with PCP/cardiology regarding cholesterol and blood pressure management   Continue to follow with Dr. Estanislado Pandy with surveillance monitoring of stent placement and ongoing duration recommendations of Brilinta 90mg  twice daily  Per Dr. Estanislado Pandy:  "Plan for follow-up in 3 months to allow for any medication adjustments and tracking of symptoms.  At that time will reassess for symptoms and need for further intervention. If she is doing well at that time, then likely can continue with her expected imaging at 6 months post procedure."  Consider doing a sleep apnea testing due to day time fatigue and lack of energy especially with increased risk of cardiovascular risk factors or recurrent stroke - please call office if you are interested   May also consider doing physical therapy due to possible deconditioning and ongoing imbalance  Continue to monitor blood pressure at home  Maintain strict control of hypertension with blood pressure goal below 130/90, diabetes with hemoglobin A1c goal below 6.5% and cholesterol with LDL cholesterol (bad cholesterol) goal below 70 mg/dL. I also advised the patient to eat a healthy diet with plenty of whole grains, cereals, fruits and vegetables, exercise regularly and maintain ideal body weight.  Followup in the future with me in 6 months or call earlier if needed       Thank you for coming to see [place] at [facility]. I hope we have been able to provide you high quality care today.  You may receive a patient satisfaction survey over the next few weeks. We would appreciate your feedback and comments so that we may continue to improve ourselves and the health of our patients.    Sleep Apnea Sleep apnea affects breathing during sleep. It causes breathing to stop for a short time or to become shallow. It can  also increase the risk of:  Heart attack.  Stroke.  Being very overweight (obese).  Diabetes.  Heart failure.  Irregular heartbeat. The goal of treatment is to help you breathe normally again. What are the causes? There are three kinds of sleep apnea:  Obstructive sleep apnea. This is caused by a blocked or collapsed airway.  Central sleep apnea. This happens when the brain does not send the right signals to the muscles that control breathing.  Mixed sleep apnea. This is a combination of obstructive and central sleep apnea. The most common cause of this condition is a collapsed or blocked airway. This can happen if:  Your throat muscles are too relaxed.  Your tongue and tonsils are too large.  You are overweight.  Your airway is too small. What increases the risk?  Being overweight.  Smoking.  Having a small airway.  Being older.  Being female.  Drinking alcohol.  Taking medicines to calm yourself (sedatives or tranquilizers).  Having family members with the condition. What are the signs or symptoms?  Trouble staying asleep.  Being sleepy or tired during the day.  Getting angry a lot.  Loud snoring.  Headaches in the morning.  Not being able to focus your mind (concentrate).  Forgetting things.  Less interest in sex.  Mood swings.  Personality changes.  Feelings of sadness (depression).  Waking up a lot during the night to pee (urinate).  Dry mouth.  Sore throat. How is this diagnosed?  Your medical history.  A physical exam.  A test that is done when you are  sleeping (sleep study). The test is most often done in a sleep lab but may also be done at home. How is this treated?   Sleeping on your side.  Using a medicine to get rid of mucus in your nose (decongestant).  Avoiding the use of alcohol, medicines to help you relax, or certain pain medicines (narcotics).  Losing weight, if needed.  Changing your diet.  Not  smoking.  Using a machine to open your airway while you sleep, such as: ? An oral appliance. This is a mouthpiece that shifts your lower jaw forward. ? A CPAP device. This device blows air through a mask when you breathe out (exhale). ? An EPAP device. This has valves that you put in each nostril. ? A BPAP device. This device blows air through a mask when you breathe in (inhale) and breathe out.  Having surgery if other treatments do not work. It is important to get treatment for sleep apnea. Without treatment, it can lead to:  High blood pressure.  Coronary artery disease.  In men, not being able to have an erection (impotence).  Reduced thinking ability. Follow these instructions at home: Lifestyle  Make changes that your doctor recommends.  Eat a healthy diet.  Lose weight if needed.  Avoid alcohol, medicines to help you relax, and some pain medicines.  Do not use any products that contain nicotine or tobacco, such as cigarettes, e-cigarettes, and chewing tobacco. If you need help quitting, ask your doctor. General instructions  Take over-the-counter and prescription medicines only as told by your doctor.  If you were given a machine to use while you sleep, use it only as told by your doctor.  If you are having surgery, make sure to tell your doctor you have sleep apnea. You may need to bring your device with you.  Keep all follow-up visits as told by your doctor. This is important. Contact a doctor if:  The machine that you were given to use during sleep bothers you or does not seem to be working.  You do not get better.  You get worse. Get help right away if:  Your chest hurts.  You have trouble breathing in enough air.  You have an uncomfortable feeling in your back, arms, or stomach.  You have trouble talking.  One side of your body feels weak.  A part of your face is hanging down. These symptoms may be an emergency. Do not wait to see if the symptoms  will go away. Get medical help right away. Call your local emergency services (911 in the [place] Do not drive yourself to the hospital. Summary  This condition affects breathing during sleep.  The most common cause is a collapsed or blocked airway.  The goal of treatment is to help you breathe normally while you sleep. This information is not intended to replace advice given to you by your health care provider. Make sure you discuss any questions you have with your health care provider. Document Revised: 09/15/2018 Document Reviewed: 07/25/2018 Elsevier Patient Education  [place].

## 2020-05-02 NOTE — Progress Notes (Signed)
I agree with the above plan 

## 2020-05-05 ENCOUNTER — Telehealth: Payer: Self-pay | Admitting: Cardiology

## 2020-05-05 NOTE — Telephone Encounter (Signed)
Pt called to report that she has been feeling very "wiped out" in the early afternoons and has had some low BP readings randomly throughout the day. She says that on Friday she was at the dermatologist to have a squamas cell take off of her neck and had not drank much all day and all weekend she had some low BP readings:   05/23: 8:17 am - 88/58             8:19 am - 95/61 10:10 am - 130/69        Evening - 145/70           She denies dizziness but increased fatigue with minimal activity.. she says that she has been having this issue since we changed her meds to BID at her last OV 04/09/20.   Some other readings:   4/30  127/79 HR 93 5/1 136/71 HR91 5/2 111/74 HR 96... was very tired especially this day 5/3 107/66 HR 88 Tired  5/4 136/74 HR 84  She says most of these are midday.   I advised her to continue to monitor and maybe more structured such as in the am and pm and write down with day/ time for Dr. Harrell Gave.   To increase her fluids and electrolytes.   I also advised her to talk with her PCP for other reasons that may be making her feel so tired. But she says she is leaving to go out of town this Wed for a month and maybe longer.   She has a previously planned virtual visit with Dr. Harrell Gave 05/13/20.

## 2020-05-05 NOTE — Telephone Encounter (Signed)
Pt c/o BP issue: STAT if pt c/o blurred vision, one-sided weakness or slurred speech  1. What are your last 5 BP readings?  05/23: 8:17 am - 88/58   8:19 am - 95/61 10:10 am - 130/69  Evening - 145/70   05:24: 111/70    2. Are you having any other symptoms (ex. Dizziness, headache, blurred vision, passed out)? Headaches  3. What is your BP issue? Patient states her BP has been extremely low this past weekend since the morning of 05/04/20 after her walk. Please call to discuss.

## 2020-05-08 NOTE — Telephone Encounter (Signed)
Agree with instructions. More recent BP numbers improved. AM readings 5/23 may be due to dehydration. We will discuss more at follow up, agree that detailed log with symptoms will be helpful.

## 2020-05-09 NOTE — Telephone Encounter (Signed)
Follow up ° ° °Patient is returning call. Please call. °

## 2020-05-09 NOTE — Telephone Encounter (Signed)
Left message to call back  

## 2020-05-13 ENCOUNTER — Encounter: Payer: Self-pay | Admitting: Cardiology

## 2020-05-13 ENCOUNTER — Telehealth (INDEPENDENT_AMBULATORY_CARE_PROVIDER_SITE_OTHER): Payer: Medicare Other | Admitting: Cardiology

## 2020-05-13 VITALS — BP 105/71 | HR 72

## 2020-05-13 DIAGNOSIS — Z8673 Personal history of transient ischemic attack (TIA), and cerebral infarction without residual deficits: Secondary | ICD-10-CM | POA: Diagnosis not present

## 2020-05-13 DIAGNOSIS — Z7189 Other specified counseling: Secondary | ICD-10-CM | POA: Diagnosis not present

## 2020-05-13 DIAGNOSIS — I10 Essential (primary) hypertension: Secondary | ICD-10-CM

## 2020-05-13 DIAGNOSIS — R5383 Other fatigue: Secondary | ICD-10-CM | POA: Diagnosis not present

## 2020-05-13 NOTE — Telephone Encounter (Signed)
Concerns addressed at [place] today 6/1 with MD.

## 2020-05-13 NOTE — Progress Notes (Signed)
Virtual Visit via Telephone Note   This visit type was conducted due to national recommendations for restrictions regarding the COVID-19 Pandemic (e.g. social distancing) in an effort to limit this patient's exposure and mitigate transmission in our community.  Due to her co-morbid illnesses, this patient is at least at moderate risk for complications without adequate follow up.  This format is felt to be most appropriate for this patient at this time.  The patient did not have access to video technology/had technical difficulties with video requiring transitioning to audio format only (telephone).  All issues noted in this document were discussed and addressed.  No physical exam could be performed with this format.  Please refer to the patient's chart for her  consent to telehealth for [facility].   Date:  05/13/2020   ID:  Shannon [name], DOB [birth year], MRN [id number]  Patient Location: Home Provider Location: Home  PCP:  Donalynn Furlong, MD  Cardiologist:  Buford Dresser, MD  Electrophysiologist:  None   Evaluation Performed:  Follow-Up Visit  Chief Complaint:  Follow up  History of Present Illness:    Shannon [name] is a 74 y.o. female with PMH hypertension, CVA, MCA stenting .  The patient does not have symptoms concerning for COVID-19 infection (fever, chills, cough, or new shortness of breath).   Cardiac notes reviewed in Care Everywhere: Per Dr. Carmelina Paddock note 01/22/2016 . Cardiac catheterization 04/2012  Nonobstructive 30% RCA disease otherwise NL and EF 65%  Today: Blood pressures have been fluctuating. No more systolic [id number] other than the day she noted in her call: "05/23: 8:17 am - 88/58 8:19 am - 95/61 10:10 am - 130/69 Evening - 145/70  She denies dizziness but increased fatigue with minimal activity. she says that she has been having this issue since we changed her meds to BID at her last OV 04/09/20.   Some other readings:    4/30  127/79 HR 93 5/1 136/71 HR91 5/2 111/74 HR 96... was very tired especially this day 5/3 107/66 HR 88 Tired  5/4 136/74 HR 84  She says most of these are midday."  Her low BP was the day she had skin surgery. Hadn't had much to eat/drink that day. She feels tired and notices more fatigue with blood pressure systolic 0000000. Hasn't had any BP greater than the 145/70 she noted above.  [place]. Walks a mile in the morning. Notes that her BP is lower after she walks, sometimes low [id number] systolic. Taking amlodipine in the AM and lisinopril in the PM.  Denies chest pain, shortness of breath at rest or with normal exertion. No PND, orthopnea, LE edema or unexpected weight gain. No syncope recently or palpitations.  Past Medical History:  Diagnosis Date  . Anxiety   . 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 Arthritis   . Cancer (HCC)    skin - squamous cell  . Complication of anesthesia    " I had difficulty coming out of it"  . Dyspnea    with exertion  . Family history of adverse reaction to anesthesia    " My daughter has a problem coming ou to it"  . GERD (gastroesophageal reflux disease)   . Headache    migraines in the past  . Hypertension   . Hypothyroidism   . Stroke ([place]) 11/2018   some weakness on right side   . Thyroid disease    Past Surgical History:  Procedure Laterality Date  . ABDOMINAL HYSTERECTOMY    . APPENDECTOMY    . CARDIAC CATHETERIZATION    . CARPAL TUNNEL RELEASE Bilateral   . COLONOSCOPY    . DILATION AND CURETTAGE OF UTERUS    . IR ANGIO INTRA EXTRACRAN SEL COM CAROTID INNOMINATE BILAT MOD SED  12/25/2018  . IR ANGIO INTRA EXTRACRAN SEL COM CAROTID INNOMINATE BILAT MOD SED  10/30/2019  . IR ANGIO VERTEBRAL SEL SUBCLAVIAN INNOMINATE UNI L MOD SED  12/25/2018  . IR ANGIO VERTEBRAL SEL SUBCLAVIAN INNOMINATE UNI L MOD SED  10/30/2019  . IR ANGIO VERTEBRAL SEL SUBCLAVIAN INNOMINATE UNI R MOD SED   01/04/2019  . IR ANGIO VERTEBRAL SEL VERTEBRAL UNI R MOD SED  12/25/2018  . IR ANGIO VERTEBRAL SEL VERTEBRAL UNI R MOD SED  10/30/2019  . IR CT HEAD LTD  01/04/2019  . IR CT HEAD LTD  11/21/2019  . IR INTRA CRAN STENT  01/04/2019  . IR INTRA CRAN STENT  11/21/2019  . IR US GUIDE VASC ACCESS RIGHT  12/25/2018  . IR US GUIDE VASC ACCESS RIGHT  11/02/2019  . OVARIAN CYST REMOVAL    . RADIOLOGY WITH ANESTHESIA N/A 01/04/2019   Procedure: RMCA angioplasty with possible stenting;  Surgeon: Luanne Bras, MD;  Location: [place];  Service: Radiology;  Laterality: N/A;  . RADIOLOGY WITH ANESTHESIA N/A 11/21/2019   Procedure: IR WITH ANESTHESIA/ STENT PLACEMENT;  Surgeon: Luanne Bras, MD;  Location: [facility];  Service: Radiology;  Laterality: N/A;     Current Meds  Medication Sig  . acetaminophen (TYLENOL) 325 MG tablet Take 650 mg by mouth every 6 (six) hours as needed for headache.  . Alum Hydroxide-Mag Carbonate (GAVISCON EXTRA STRENGTH) 508-475 MG/10ML SUSP Take 5 mLs by mouth daily as needed (acid reflux).  Marland Kitchen amLODipine (NORVASC) 2.5 MG tablet Take 1 tablet (2.5 mg total) by mouth daily.  Marland Kitchen aspirin EC 81 MG EC tablet Take 1 tablet (81 mg total) by mouth daily.  Marland Kitchen atorvastatin (LIPITOR) 40 MG tablet Take 1 tablet (40 mg total) by mouth daily at 6 PM.  . busPIRone (BUSPAR) 5 MG tablet Take 5 mg by mouth daily as needed (anxiety).   . Calcium Carb-Cholecalciferol (CALCIUM 600 + D PO) Take 0.5-1 tablets by mouth See admin instructions. Take 1 tablet after breakfast, 1 tablet after lunch, and 0.5 tablet after dinner  . Carboxymethylcellulose Sodium (ARTIFICIAL TEARS OP) Place 1 drop into both eyes daily as needed (dry eyes).  . Coenzyme Q10 50 MG CAPS Take 50 mg by mouth daily.   Marland Kitchen ketoconazole (NIZORAL) 2 % cream Apply 1 application topically daily. For 30 days  . levothyroxine (SYNTHROID) 75 MCG tablet Take 75 mcg by mouth 4 (four) times a week. [name], Thurs, Sat  . levothyroxine (SYNTHROID,  LEVOTHROID) 50 MCG tablet Take 50 mcg by mouth every Monday, Wednesday, and Friday.   Marland Kitchen lisinopril (PRINIVIL,ZESTRIL) 10 MG tablet Take 10 mg by mouth daily.  Marland Kitchen loratadine (CLARITIN) 10 MG tablet Take 10 mg by mouth daily.  . Multiple Vitamin (MULTIVITAMIN) capsule Take 1 capsule by mouth daily. Senior  . ticagrelor (BRILINTA) 90 MG TABS tablet Take 1 tablet (90 mg total) by mouth 2 (two) times daily.  . vitamin E 400 UNIT capsule Take 400 Units by mouth every other day.      Allergies:   Elastic bandages & [zinc], Sulfa antibiotics, Mandol [cefamandole], and Sinutab non-drowsy max [pseudoephedrine-acetaminophen]   Social History   Tobacco Use  .  Smoking status: Never Smoker  . Smokeless tobacco: Never Used  Substance Use Topics  . Alcohol use: Never  . Drug use: Never     Family Hx: The patient's family history includes CVA in her maternal aunt and mother; Heart disease in her mother; Peripheral Artery Disease in her father.  ROS:   Please see the history of present illness.   ROS otherwise unremarkable except as documented.  Prior CV studies:   The following studies were reviewed today: Echo 12/09/18 reviewed  Labs/Other Tests and Data Reviewed:    EKG:  An ECG dated 10/17/2019 was personally reviewed today and demonstrated:  nsr  Recent Labs: 11/22/2019: BUN 6; Hemoglobin 11.1; Platelets 173; Potassium 3.0; Sodium 137 04/01/2020: Creatinine, Ser 0.70   Recent Lipid Panel Lab Results  Component Value Date/Time   CHOL 199 12/09/2018 03:52 AM   TRIG 110 12/09/2018 03:52 AM   HDL 45 12/09/2018 03:52 AM   CHOLHDL 4.4 12/09/2018 03:52 AM   LDLCALC 132 (H) 12/09/2018 03:52 AM    Wt Readings from Last 3 Encounters:  04/29/20 116 lb (52.6 kg)  04/09/20 115 lb (52.2 kg)  12/05/19 112 lb (50.8 kg)     Objective:    Vital Signs:  BP 105/71   Pulse 72    Speaking comfortably on the phone, no audible wheezing In no acute distress Alert and oriented Normal  affect Normal speech  ASSESSMENT & PLAN:    Hypertension: BP readings have improved, but feels very fatigued. Wonders if this is her BP. Had one low BP reading on day she was NPO for skin surgery, but otherwise no severe lows. -continue lisinopril 10 mg daily at night -continue amlodipine 2.5 mg daily in the morning -discussed that her BP runs lowest first thing in the AM. Reasonable to wait to take the amlodipine until later in the day if her BP is low. Can still have elevated pressures in the PM, so want to maintain regimen for good control given stroke history -instructed on red flag warning signs that need immediate medical attention -would avoid beta blockers in the future given her fatigue.  History of CVA, with need for secondary prevention: -on aspirin, ticagrelor. Ticagrelor dose/duration per interventional neurology -on high intensity atorvastatin -echo 12/09/18 was normal, negative bubble study  CV risk and secondary prevention: -recommend heart healthy/Mediterranean diet, with whole grains, fruits, vegetable, fish, lean meats, nuts, and olive oil. Limit salt. -recommend moderate walking, 3-5 times/week for 30-50 minutes each session. Aim for at least 150 minutes.week. Goal should be pace of 3 miles/hours, or walking 1.5 miles in 30 minutes -recommend avoidance of tobacco products. Avoid excess alcohol.  COVID-19 Education: The signs and symptoms of COVID-19 were discussed with the patient and how to seek care for testing (follow up with PCP or arrange E-visit).  The importance of social distancing was discussed today.  Follow up 6 weeks to monitor BP and symptoms, call sooner with any issues  Time:   Today, I have spent 17 minutes with the patient with telehealth technology discussing the above problems.    Patient Instructions  Medication Instructions:  Your Physician recommend you continue on your current medication as directed.    *If you need a refill on your  cardiac medications before your next appointment, please call your pharmacy*   Lab Work: None   Testing/Procedures: None   Follow-Up: At [facility], you and your health needs are our priority.  As part of our continuing mission to provide  you with exceptional heart care, we have created designated Provider Care Teams.  These Care Teams include your primary Cardiologist (physician) and Advanced Practice Providers (APPs -  Physician Assistants and Nurse Practitioners) who all work together to provide you with the care you need, when you need it.  We recommend signing up for the patient portal called "MyChart".  Sign up information is provided on this After Visit Summary.  MyChart is used to connect with patients for Virtual Visits (Telemedicine).  Patients are able to view lab/test results, encounter notes, upcoming appointments, etc.  Non-urgent messages can be sent to your provider as well.   To learn more about what you can do with MyChart, go to [email or web address].    Your next appointment:   6 weeks  The format for your next appointment:   Either In Person or Virtual  Provider:   Buford Dresser, MD       Signed, Buford Dresser, MD  05/13/2020    [place]

## 2020-05-13 NOTE — Patient Instructions (Signed)
Medication Instructions:  Your Physician recommend you continue on your current medication as directed.    *If you need a refill on your cardiac medications before your next appointment, please call your pharmacy*   Lab Work: None   Testing/Procedures: None   Follow-Up: At [facility], you and your health needs are our priority.  As part of our continuing mission to provide you with exceptional heart care, we have created designated Provider Care Teams.  These Care Teams include your primary Cardiologist (physician) and Advanced Practice Providers (APPs -  Physician Assistants and Nurse Practitioners) who all work together to provide you with the care you need, when you need it.  We recommend signing up for the patient portal called "MyChart".  Sign up information is provided on this After Visit Summary.  MyChart is used to connect with patients for Virtual Visits (Telemedicine).  Patients are able to view lab/test results, encounter notes, upcoming appointments, etc.  Non-urgent messages can be sent to your provider as well.   To learn more about what you can do with MyChart, go to [email or web address].    Your next appointment:   6 weeks  The format for your next appointment:   Either In Person or Virtual  Provider:   Buford Dresser, MD

## 2020-06-23 ENCOUNTER — Telehealth (HOSPITAL_COMMUNITY): Payer: Self-pay

## 2020-06-23 NOTE — Telephone Encounter (Signed)
Pt agreed to f/u in 3 months with diagnostic angiogram. She will see Dr. Harrell Gave again regarding her BP medication. AW

## 2020-06-30 ENCOUNTER — Telehealth: Payer: Self-pay | Admitting: Student

## 2020-06-30 NOTE — Telephone Encounter (Signed)
NIR.  Received fax prescription refill request for Brilinta from pharmacy. Faxed filled prescription to [facility] in [place], [place] [phone]) at [phone]- Brilinta 90 mg tablets, take one tablet by mouth twice daily, dispense 180 tablets (90 day supply) with 1 refill.   Bea Graff Camila Maita, PA-C 06/30/2020, 11:47 AM

## 2020-07-25 ENCOUNTER — Encounter: Payer: Self-pay | Admitting: Cardiology

## 2020-07-25 ENCOUNTER — Other Ambulatory Visit: Payer: Self-pay

## 2020-07-25 ENCOUNTER — Ambulatory Visit (INDEPENDENT_AMBULATORY_CARE_PROVIDER_SITE_OTHER): Payer: Medicare Other | Admitting: Cardiology

## 2020-07-25 VITALS — BP 122/84 | HR 88 | Temp 97.2°F | Ht 65.0 in | Wt 116.6 lb

## 2020-07-25 DIAGNOSIS — Z7189 Other specified counseling: Secondary | ICD-10-CM

## 2020-07-25 DIAGNOSIS — R5383 Other fatigue: Secondary | ICD-10-CM | POA: Diagnosis not present

## 2020-07-25 DIAGNOSIS — Z8673 Personal history of transient ischemic attack (TIA), and cerebral infarction without residual deficits: Secondary | ICD-10-CM | POA: Diagnosis not present

## 2020-07-25 DIAGNOSIS — R002 Palpitations: Secondary | ICD-10-CM | POA: Diagnosis not present

## 2020-07-25 DIAGNOSIS — I10 Essential (primary) hypertension: Secondary | ICD-10-CM

## 2020-07-25 NOTE — Progress Notes (Signed)
Cardiology Office Note:    Date:  07/25/2020   ID:  [name], DOB 06-03-1946, MRN [id number]  PCP:  Donalynn Furlong, MD  Cardiologist:  Buford Dresser, MD  Referring MD: Donalynn Furlong, MD   Cc: follow up  History of Present Illness:    Shannon [name] is a 74 y.o. female with a hx of hypertension, CVA who is seen for follow up today. She was seen in the ER on 09/30/19 for concern for stroke, elevated blood pressure. 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  Today: Planned for angiogram in 09/2020 with Dr. Estanislado Pandy.  Brings a log of blood pressures. Range 135/66 to 92/61, most 110/60. Heart rates have been 70s-100s. Lowest BP 79/56, HR 78 at that time.   Always tired, feels limited in her walking, [place]   Continues to have intermittent palpitations, some chest tightness with this.   Denies shortness of breath at rest or with normal exertion. No PND, orthopnea, LE edema or unexpected weight gain. No syncope.   Past Medical History:  Diagnosis Date  . Anxiety   . 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 Arthritis   . Cancer (HCC)    skin - squamous cell  . Complication of anesthesia    " I had difficulty coming out of it"  . Dyspnea    with exertion  . Family history of adverse reaction to anesthesia    " My daughter has a problem coming ou to it"  . GERD (gastroesophageal reflux disease)   . Headache    migraines in the past  . Hypertension   . Hypothyroidism   . Stroke ([place]) 11/2018   some weakness on right side   . Thyroid disease     Past Surgical History:  Procedure Laterality Date  . ABDOMINAL HYSTERECTOMY    . APPENDECTOMY    . CARDIAC CATHETERIZATION    . CARPAL TUNNEL RELEASE Bilateral   . COLONOSCOPY    . DILATION AND CURETTAGE OF UTERUS    . IR ANGIO INTRA EXTRACRAN SEL COM CAROTID INNOMINATE BILAT MOD SED  12/25/2018  . IR ANGIO  INTRA EXTRACRAN SEL COM CAROTID INNOMINATE BILAT MOD SED  10/30/2019  . IR ANGIO VERTEBRAL SEL SUBCLAVIAN INNOMINATE UNI L MOD SED  12/25/2018  . IR ANGIO VERTEBRAL SEL SUBCLAVIAN INNOMINATE UNI L MOD SED  10/30/2019  . IR ANGIO VERTEBRAL SEL SUBCLAVIAN INNOMINATE UNI R MOD SED  01/04/2019  . IR ANGIO VERTEBRAL SEL VERTEBRAL UNI R MOD SED  12/25/2018  . IR ANGIO VERTEBRAL SEL VERTEBRAL UNI R MOD SED  10/30/2019  . IR CT HEAD LTD  01/04/2019  . IR CT HEAD LTD  11/21/2019  . IR INTRA CRAN STENT  01/04/2019  . IR INTRA CRAN STENT  11/21/2019  . IR US GUIDE VASC ACCESS RIGHT  12/25/2018  . IR US GUIDE VASC ACCESS RIGHT  11/02/2019  . OVARIAN CYST REMOVAL    . RADIOLOGY WITH ANESTHESIA N/A 01/04/2019   Procedure: RMCA angioplasty with possible stenting;  Surgeon: Luanne Bras, MD;  Location: [facility];  Service: Radiology;  Laterality: N/A;  . RADIOLOGY WITH ANESTHESIA N/A 11/21/2019   Procedure: IR WITH ANESTHESIA/ STENT PLACEMENT;  Surgeon: Luanne Bras, MD;  Location: [facility];  Service: Radiology;  Laterality: N/A;    Current Medications: Current Outpatient Medications on File Prior to Visit  Medication Sig  . acetaminophen (TYLENOL) 325  MG tablet Take 650 mg by mouth every 6 (six) hours as needed for headache.  . Alum Hydroxide-Mag Carbonate (GAVISCON EXTRA STRENGTH) 508-475 MG/10ML SUSP Take 5 mLs by mouth daily as needed (acid reflux).  Marland Kitchen amLODipine (NORVASC) 2.5 MG tablet Take 1 tablet (2.5 mg total) by mouth daily.  Marland Kitchen aspirin EC 81 MG EC tablet Take 1 tablet (81 mg total) by mouth daily.  Marland Kitchen atorvastatin (LIPITOR) 40 MG tablet Take 1 tablet (40 mg total) by mouth daily at 6 PM.  . busPIRone (BUSPAR) 5 MG tablet Take 5 mg by mouth daily as needed (anxiety).   . Calcium Carb-Cholecalciferol (CALCIUM 600 + D PO) Take 0.5-1 tablets by mouth See admin instructions. Take 1 tablet after breakfast, 1 tablet after lunch, and 0.5 tablet after dinner  . Carboxymethylcellulose Sodium (ARTIFICIAL TEARS  OP) Place 1 drop into both eyes daily as needed (dry eyes).  . Coenzyme Q10 50 MG CAPS Take 50 mg by mouth daily.   Marland Kitchen ketoconazole (NIZORAL) 2 % cream Apply 1 application topically daily. For 30 days  . levothyroxine (SYNTHROID) 75 MCG tablet Take 75 mcg by mouth 4 (four) times a week. [name], Thurs, Sat  . levothyroxine (SYNTHROID, LEVOTHROID) 50 MCG tablet Take 50 mcg by mouth every Monday, Wednesday, and Friday.   Marland Kitchen lisinopril (PRINIVIL,ZESTRIL) 10 MG tablet Take 10 mg by mouth daily.  Marland Kitchen loratadine (CLARITIN) 10 MG tablet Take 10 mg by mouth daily.  . Multiple Vitamin (MULTIVITAMIN) capsule Take 1 capsule by mouth daily. Senior  . ticagrelor (BRILINTA) 90 MG TABS tablet Take 1 tablet (90 mg total) by mouth 2 (two) times daily.  . vitamin E 400 UNIT capsule Take 400 Units by mouth every other day.    No current facility-administered medications on file prior to visit.     Allergies:   Elastic bandages & [zinc], Sulfa antibiotics, Mandol [cefamandole], and Sinutab non-drowsy max [pseudoephedrine-acetaminophen]   Social History   Tobacco Use  . Smoking status: Never Smoker  . Smokeless tobacco: Never Used  Vaping Use  . Vaping Use: Never used  Substance Use Topics  . Alcohol use: Never  . Drug use: Never    Family History: family history includes CVA in her maternal aunt and mother; Heart disease in her mother; Peripheral Artery Disease in her father.  ROS:   Please see the history of present illness.  Additional pertinent ROS otherwise unremarkable.  EKGs/Labs/Other Studies Reviewed:    The following studies were reviewed today: Echo 12/09/2018 - Left ventricle: The cavity size was normal. Systolic function was   normal. The estimated ejection fraction was in the range of 60%   to 65%. Wall motion was normal; there were no regional wall   motion abnormalities. Left ventricular diastolic function   parameters were normal. - Left atrium: The atrium was normal in size. -  Right ventricle: Systolic function was normal. - Atrial septum: No defect or patent foramen ovale was identified.   Echo contrast study showed no right-to-left atrial level shunt,   at baseline or with provocation. - Pulmonary arteries: Systolic pressure was within the normal   range.  EKG:  EKG is personally reviewed.  The ekg ordered 04/09/20 demonstrates NSR with sinus arrhythmia.  Recent Labs: 11/22/2019: BUN 6; Hemoglobin 11.1; Platelets 173; Potassium 3.0; Sodium 137 04/01/2020: Creatinine, Ser 0.70  Recent Lipid Panel    Component Value Date/Time   CHOL 199 12/09/2018 0352   TRIG 110 12/09/2018 0352   HDL 45  12/09/2018 0352   CHOLHDL 4.4 12/09/2018 0352   VLDL 22 12/09/2018 0352   LDLCALC 132 (H) 12/09/2018 0352    Physical Exam:    VS:  BP 122/84   Pulse 88   Temp (!) 97.2 F (36.2 C)   Ht 5\' 5"  (1.651 m)   Wt 116 lb 9.6 oz (52.9 kg)   SpO2 96%   BMI 19.40 kg/m     Wt Readings from Last 3 Encounters:  07/25/20 116 lb 9.6 oz (52.9 kg)  04/29/20 116 lb (52.6 kg)  04/09/20 115 lb (52.2 kg)    GEN: Well nourished, well developed in no acute distress HEENT: Normal, moist mucous membranes NECK: No JVD CARDIAC: regular rhythm, normal S1 and S2, no rubs or gallops. No murmur. VASCULAR: Radial and DP pulses 2+ bilaterally. No carotid bruits RESPIRATORY:  Clear to auscultation without rales, wheezing or rhonchi  ABDOMEN: Soft, non-tender, non-distended MUSCULOSKELETAL:  Ambulates independently SKIN: Warm and dry, no edema NEUROLOGIC:  Alert and oriented x 3. No focal neuro deficits noted. PSYCHIATRIC:  Normal affect   ASSESSMENT:    1. Palpitation   2. Essential hypertension   3. History of CVA (cerebrovascular accident)   4. Fatigue, unspecified type   5. Cardiac risk counseling   6. Counseling on health promotion and disease prevention    PLAN:    Palpitations: intermittent, associated with chest pain. Feels fatigued as well -will order Zio  monitor -counseled on red flag warning signs that need immediate medical attention  Hypertension: Has nonspecific symptoms with both low and high readings -continue amlodipine 2.5 mg daily -With intermittent lows, will hold lisinopril temporarily and see how this affects her numbers -reviewed home BP checking -instructed on red flag warning signs that need immediate medical attention -would avoid beta blockers in the future given her fatigue.  History of CVA, with need for secondary prevention: -on aspirin, ticagrelor. Ticagrelor duration per interventional neurology -on high intensity atorvastatin -echo 12/09/18 was normal, negative bubble study  Cardiac risk counseling and prevention recommendations: -recommend heart healthy/Mediterranean diet, with whole grains, fruits, vegetable, fish, lean meats, nuts, and olive oil. Limit salt. -recommend moderate walking, 3-5 times/week for 30-50 minutes each session. Aim for at least 150 minutes.week. Goal should be pace of 3 miles/hours, or walking 1.5 miles in 30 minutes -recommend avoidance of tobacco products. Avoid excess alcohol.  Plan for follow up: 4 weeks  Medication Adjustments/Labs and Tests Ordered: Current medicines are reviewed at length with the patient today.  Concerns regarding medicines are outlined above.  Orders Placed This Encounter  Procedures  . LONG TERM MONITOR (3-14 DAYS)   No orders of the defined types were placed in this encounter.   Patient Instructions  Medication Instructions:  Keep amlodipine, but stop the lisinopril until our next visit. Keep a log of BP and how you feel.  Low blood pressure is as dangerous as high blood pressure, so we will see if you feel better. Ideally goal would be 120s/70s (<130/80) without dropping too low.  *If you need a refill on your cardiac medications before your next appointment, please call your pharmacy*   Lab Work: None   Testing/Procedures: Our physician has  recommended that you wear an [place] monitor. The Zio patch cardiac monitor continuously records heart rhythm data for up to 14 days, this is for patients being evaluated for multiple types heart rhythms. For the first 24 hours post application, please avoid getting the Zio monitor wet in  the shower or by excessive sweating during exercise. After that, feel free to carry on with regular activities. Keep soaps and lotions away from the ZIO XT Patch.   Someone will call to mail monitor.    Follow-Up: At [facility], you and your health needs are our priority.  As part of our continuing mission to provide you with exceptional heart care, we have created designated Provider Care Teams.  These Care Teams include your primary Cardiologist (physician) and Advanced Practice Providers (APPs -  Physician Assistants and Nurse Practitioners) who all work together to provide you with the care you need, when you need it.  We recommend signing up for the patient portal called "MyChart".  Sign up information is provided on this After Visit Summary.  MyChart is used to connect with patients for Virtual Visits (Telemedicine).  Patients are able to view lab/test results, encounter notes, upcoming appointments, etc.  Non-urgent messages can be sent to your provider as well.   To learn more about what you can do with MyChart, go to [email or web address].    Your next appointment:   4 week(s)  The format for your next appointment:   Virtual Visit   Provider:   Buford Dresser, MD      Signed, Buford Dresser, MD PhD 07/25/2020    [place]

## 2020-07-25 NOTE — Patient Instructions (Addendum)
Medication Instructions:  Keep amlodipine, but stop the lisinopril until our next visit. Keep a log of BP and how you feel.  Low blood pressure is as dangerous as high blood pressure, so we will see if you feel better. Ideally goal would be 120s/70s (<130/80) without dropping too low.  *If you need a refill on your cardiac medications before your next appointment, please call your pharmacy*   Lab Work: None   Testing/Procedures: Our physician has recommended that you wear an [place] monitor. The Zio patch cardiac monitor continuously records heart rhythm data for up to 14 days, this is for patients being evaluated for multiple types heart rhythms. For the first 24 hours post application, please avoid getting the Zio monitor wet in the shower or by excessive sweating during exercise. After that, feel free to carry on with regular activities. Keep soaps and lotions away from the ZIO XT Patch.   Someone will call to mail monitor.    Follow-Up: At [facility], you and your health needs are our priority.  As part of our continuing mission to provide you with exceptional heart care, we have created designated Provider Care Teams.  These Care Teams include your primary Cardiologist (physician) and Advanced Practice Providers (APPs -  Physician Assistants and Nurse Practitioners) who all work together to provide you with the care you need, when you need it.  We recommend signing up for the patient portal called "MyChart".  Sign up information is provided on this After Visit Summary.  MyChart is used to connect with patients for Virtual Visits (Telemedicine).  Patients are able to view lab/test results, encounter notes, upcoming appointments, etc.  Non-urgent messages can be sent to your provider as well.   To learn more about what you can do with MyChart, go to [email or web address].    Your next appointment:   4 week(s)  The format for your next appointment:   Virtual Visit    Provider:   Buford Dresser, MD

## 2020-07-28 ENCOUNTER — Telehealth: Payer: Self-pay | Admitting: Cardiology

## 2020-07-28 ENCOUNTER — Telehealth: Payer: Self-pay | Admitting: *Deleted

## 2020-07-28 NOTE — Telephone Encounter (Signed)
First monitor [id number] has already shipped to [address]  [employer] representative has been call and will mark as lost.  Please return monitor to [facility] in prepaid packaging when and if possible. A second monitor will be shipped to the [facility] address.

## 2020-07-28 NOTE — Telephone Encounter (Signed)
Left message for patient to call and schedule 4 week virtual follow up with Dr. Harrell Gave

## 2020-07-28 NOTE — Telephone Encounter (Signed)
Change address to have ZIO XT mailed to [address] [place], [place] [address]. Call patient back if first monitor has been shipped to her home address.  She will call neighbor to open package and return to [place].

## 2020-07-31 ENCOUNTER — Ambulatory Visit (INDEPENDENT_AMBULATORY_CARE_PROVIDER_SITE_OTHER): Payer: Medicare Other

## 2020-07-31 DIAGNOSIS — R002 Palpitations: Secondary | ICD-10-CM | POA: Diagnosis not present

## 2020-08-19 ENCOUNTER — Telehealth (INDEPENDENT_AMBULATORY_CARE_PROVIDER_SITE_OTHER): Payer: Medicare Other | Admitting: Cardiology

## 2020-08-19 ENCOUNTER — Encounter: Payer: Self-pay | Admitting: Cardiology

## 2020-08-19 ENCOUNTER — Telehealth: Payer: Self-pay

## 2020-08-19 VITALS — BP 127/69 | HR 90 | Ht 65.0 in | Wt 116.0 lb

## 2020-08-19 DIAGNOSIS — R002 Palpitations: Secondary | ICD-10-CM

## 2020-08-19 DIAGNOSIS — Z8673 Personal history of transient ischemic attack (TIA), and cerebral infarction without residual deficits: Secondary | ICD-10-CM | POA: Diagnosis not present

## 2020-08-19 DIAGNOSIS — Z7189 Other specified counseling: Secondary | ICD-10-CM | POA: Diagnosis not present

## 2020-08-19 DIAGNOSIS — I10 Essential (primary) hypertension: Secondary | ICD-10-CM

## 2020-08-19 DIAGNOSIS — I479 Paroxysmal tachycardia, unspecified: Secondary | ICD-10-CM

## 2020-08-19 MED ORDER — AMLODIPINE BESYLATE 2.5 MG PO TABS: 2.5000 mg | ORAL_TABLET | Freq: Every day | ORAL | 3 refills | Status: DC

## 2020-08-19 NOTE — Progress Notes (Signed)
Virtual Visit via Telephone Note   This visit type was conducted due to national recommendations for restrictions regarding the COVID-19 Pandemic (e.g. social distancing) in an effort to limit this patient's exposure and mitigate transmission in our community.  Due to her co-morbid illnesses, this patient is at least at moderate risk for complications without adequate follow up.  This format is felt to be most appropriate for this patient at this time.  The patient did not have access to video technology/had technical difficulties with video requiring transitioning to audio format only (telephone).  All issues noted in this document were discussed and addressed.  No physical exam could be performed with this format.  Please refer to the patient's chart for her  consent to telehealth for [facility].   The patient was identified using 2 identifiers.  Patient Location: Home Provider Location: Home Office  Date:  08/19/2020   ID:  Shannon [name], DOB 12/25/1945, MRN [id number]  PCP:  Donalynn Furlong, MD  Cardiologist:  Buford Dresser, MD  Referring MD: Donalynn Furlong, MD   Cc: follow up  History of Present Illness:    Shannon [name] is a 74 y.o. female with a hx of hypertension, CVA who is seen for follow up today. She was seen in the ER on 09/30/19 for concern for stroke, elevated blood pressure. 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  Today: Feels better off lisinopril. Blood pressures still fluctuating. Has a little more energy, less fatigue. Only 3 readings below 322 systolic, which were upper 90s/60s. Otherwise has been >025 systolic. 161/73 one day, has a headache that day, just before bed. Otherwise 150/80 was her highest blood pressure, tends to be higher at night. Pulse tends to run in the 90s, but also fluctuating, up to 161 bpm.  Wore monitor, results still pending. Had some uncertainty that the  button captured her events. Sent monitor back 08/08/20.  Able to walk several miles/day on most days. Does feel fatigued on some days after hiking, but better than before. Very rare tightness in her chest, usually with faster heart rates. Feels like she can't breathe. Not only exertional, also after drinking, talking for a long time, etc. Rare chest pain is still sharp, unrelated.  Trying to stay safe with Covid, balancing safety with activity. [place], avoiding social activities.  Denies shortness of breath at rest or with normal exertion. No PND, orthopnea, LE edema or unexpected weight gain. No syncope. Occasional dizzy spells, most recently after driving around on winding roads.   Past Medical History:  Diagnosis Date   Anxiety    Artery stenosis ([place])    cerebral   Arthritis    Cancer ([place])    skin - squamous cell   Complication of anesthesia    " I had difficulty coming out of it"   Dyspnea    with exertion   Family history of adverse reaction to anesthesia    " My daughter has a problem coming ou to it"   GERD (gastroesophageal reflux disease)    Headache    migraines in the past   Hypertension    Hypothyroidism    Stroke [facility]) 11/2018   some weakness on right side    Thyroid disease     Past Surgical History:  Procedure Laterality Date   ABDOMINAL HYSTERECTOMY     APPENDECTOMY     CARDIAC CATHETERIZATION     CARPAL  TUNNEL RELEASE Bilateral    COLONOSCOPY     DILATION AND CURETTAGE OF UTERUS     IR ANGIO INTRA EXTRACRAN SEL COM CAROTID INNOMINATE BILAT MOD SED  12/25/2018   IR ANGIO INTRA EXTRACRAN SEL COM CAROTID INNOMINATE BILAT MOD SED  10/30/2019   IR ANGIO VERTEBRAL SEL SUBCLAVIAN INNOMINATE UNI L MOD SED  12/25/2018   IR ANGIO VERTEBRAL SEL SUBCLAVIAN INNOMINATE UNI L MOD SED  10/30/2019   IR ANGIO VERTEBRAL SEL SUBCLAVIAN INNOMINATE UNI R MOD SED  01/04/2019   IR ANGIO VERTEBRAL SEL VERTEBRAL UNI R MOD SED   12/25/2018   IR ANGIO VERTEBRAL SEL VERTEBRAL UNI R MOD SED  10/30/2019   IR CT HEAD LTD  01/04/2019   IR CT HEAD LTD  11/21/2019   IR INTRA CRAN STENT  01/04/2019   IR INTRA CRAN STENT  11/21/2019   IR US GUIDE VASC ACCESS RIGHT  12/25/2018   IR US GUIDE VASC ACCESS RIGHT  11/02/2019   OVARIAN CYST REMOVAL     RADIOLOGY WITH ANESTHESIA N/A 01/04/2019   Procedure: RMCA angioplasty with possible stenting;  Surgeon: Luanne Bras, MD;  Location: [place];  Service: Radiology;  Laterality: N/A;   RADIOLOGY WITH ANESTHESIA N/A 11/21/2019   Procedure: IR WITH ANESTHESIA/ STENT PLACEMENT;  Surgeon: Luanne Bras, MD;  Location: [place];  Service: Radiology;  Laterality: N/A;    Current Medications: Current Outpatient Medications on File Prior to Visit  Medication Sig   acetaminophen (TYLENOL) 325 MG tablet Take 650 mg by mouth every 6 (six) hours as needed for headache.   aspirin EC 81 MG EC tablet Take 1 tablet (81 mg total) by mouth daily.   atorvastatin (LIPITOR) 40 MG tablet Take 1 tablet (40 mg total) by mouth daily at 6 PM.   busPIRone (BUSPAR) 5 MG tablet Take 5 mg by mouth daily as needed (anxiety).    Calcium Carb-Cholecalciferol (CALCIUM 600 + D PO) Take 0.5-1 tablets by mouth See admin instructions. Take 1 tablet after breakfast, 1 tablet after lunch, and 0.5 tablet after dinner   Carboxymethylcellulose Sodium (ARTIFICIAL TEARS OP) Place 1 drop into both eyes daily as needed (dry eyes).   Coenzyme Q10 50 MG CAPS Take 50 mg by mouth daily.    levothyroxine (SYNTHROID) 75 MCG tablet Take 75 mcg by mouth 4 (four) times a week. [name], Thurs, Sat   levothyroxine (SYNTHROID, LEVOTHROID) 50 MCG tablet Take 50 mcg by mouth every Monday, Wednesday, and Friday.    loratadine (CLARITIN) 10 MG tablet Take 10 mg by mouth daily.   Multiple Vitamin (MULTIVITAMIN) capsule Take 1 capsule by mouth daily. Senior   ticagrelor (BRILINTA) 90 MG TABS tablet Take 1 tablet (90 mg  total) by mouth 2 (two) times daily.   vitamin E 400 UNIT capsule Take 400 Units by mouth every other day.    Alum Hydroxide-Mag Carbonate (GAVISCON EXTRA STRENGTH) 508-475 MG/10ML SUSP Take 5 mLs by mouth daily as needed (acid reflux). (Patient not taking: Reported on 08/19/2020)   No current facility-administered medications on file prior to visit.     Allergies:   Elastic bandages & [zinc], Sulfa antibiotics, Mandol [cefamandole], and Sinutab non-drowsy max [pseudoephedrine-acetaminophen]   Social History   Tobacco Use   Smoking status: Never Smoker   Smokeless tobacco: Never Used  Vaping Use   Vaping Use: Never used  Substance Use Topics   Alcohol use: Never   Drug use: Never    Family History: family history includes  CVA in her maternal aunt and mother; Heart disease in her mother; Peripheral Artery Disease in her father.  ROS:   Please see the history of present illness.  Additional pertinent ROS otherwise unremarkable.  EKGs/Labs/Other Studies Reviewed:    The following studies were reviewed today: Echo 12/09/2018 - Left ventricle: The cavity size was normal. Systolic function was   normal. The estimated ejection fraction was in the range of 60%   to 65%. Wall motion was normal; there were no regional wall   motion abnormalities. Left ventricular diastolic function   parameters were normal. - Left atrium: The atrium was normal in size. - Right ventricle: Systolic function was normal. - Atrial septum: No defect or patent foramen ovale was identified.   Echo contrast study showed no right-to-left atrial level shunt,   at baseline or with provocation. - Pulmonary arteries: Systolic pressure was within the normal   range.  EKG:  EKG is personally reviewed.  The ekg ordered 04/09/20 demonstrates NSR with sinus arrhythmia.  Recent Labs: 11/22/2019: BUN 6; Hemoglobin 11.1; Platelets 173; Potassium 3.0; Sodium 137 04/01/2020: Creatinine, Ser 0.70  Recent Lipid  Panel    Component Value Date/Time   CHOL 199 12/09/2018 0352   TRIG 110 12/09/2018 0352   HDL 45 12/09/2018 0352   CHOLHDL 4.4 12/09/2018 0352   VLDL 22 12/09/2018 0352   LDLCALC 132 (H) 12/09/2018 0352    Physical Exam:    VS:  BP 127/69    Pulse 90    Ht 5\' 5"  (1.651 m)    Wt 116 lb (52.6 kg)    BMI 19.30 kg/m     Wt Readings from Last 3 Encounters:  08/19/20 116 lb (52.6 kg)  07/25/20 116 lb 9.6 oz (52.9 kg)  04/29/20 116 lb (52.6 kg)    Speaking comfortably on the phone, no audible wheezing In no acute distress Alert and oriented Normal affect Normal speech  ASSESSMENT:    1. Essential hypertension   2. History of CVA (cerebrovascular accident)   3. Palpitation   4. Cardiac risk counseling   5. Counseling on health promotion and disease prevention   6. Educated about COVID-19 virus infection   7. Paroxysmal tachycardia, unspecified (HCC)    PLAN:    Palpitations, tachycardia: -awaiting monitor results, mailed back last week  Chest pain: sharp, brief. Atypical for cardiac pain. Unchanged from prior -counseled on red flag warning signs that need immediate medical attention  Hypertension: labile readings, but fewer low readings since stopping the lisinopril -continue amlodipine 2.5 mg daily -instructed on red flag warning signs that need immediate medical attention -would avoid beta blockers in the future given her fatigue, though if she has frequent ectopy on her monitor may need to discuss a trial of beta blocker  History of CVA, with need for secondary prevention: -on aspirin, ticagrelor. Ticagrelor duration per interventional neurology -on high intensity atorvastatin -echo 12/09/18 was normal, negative bubble study  Cardiac risk counseling and prevention recommendations: -recommend heart healthy/Mediterranean diet, with whole grains, fruits, vegetable, fish, lean meats, nuts, and olive oil. Limit salt. -recommend moderate walking, 3-5 times/week for  30-50 minutes each session. Aim for at least 150 minutes.week. Goal should be pace of 3 miles/hours, or walking 1.5 miles in 30 minutes -recommend avoidance of tobacco products. Avoid excess alcohol.  Plan for follow up: 6 mos  Today, I have spent 22 minutes with the patient with telehealth technology discussing the above problems.  Additional time spent in chart review,  documentation, and communication.  Medication Adjustments/Labs and Tests Ordered: Current medicines are reviewed at length with the patient today.  Concerns regarding medicines are outlined above.  No orders of the defined types were placed in this encounter.  Meds ordered this encounter  Medications   amLODipine (NORVASC) 2.5 MG tablet    Sig: Take 1 tablet (2.5 mg total) by mouth daily.    Dispense:  90 tablet    Refill:  3    Patient Instructions  Medication Instructions:  Your physician recommends that you continue on your current medications as directed. Please refer to the Current Medication list given to you today.  *If you need a refill on your cardiac medications before your next appointment, please call your pharmacy*   Lab Work: NONE ordered at this time of appointment   If you have labs (blood work) drawn today and your tests are completely normal, you will receive your results only by:  [place] (if you have MyChart) OR  A paper copy in the mail If you have any lab test that is abnormal or we need to change your treatment, we will call you to review the results.   Testing/Procedures: NONE ordered at this time of appointment   Follow-Up: At [facility], you and your health needs are our priority.  As part of our continuing mission to provide you with exceptional heart care, we have created designated Provider Care Teams.  These Care Teams include your primary Cardiologist (physician) and Advanced Practice Providers (APPs -  Physician Assistants and Nurse Practitioners) who all work  together to provide you with the care you need, when you need it.  We recommend signing up for the patient portal called "MyChart".  Sign up information is provided on this After Visit Summary.  MyChart is used to connect with patients for Virtual Visits (Telemedicine).  Patients are able to view lab/test results, encounter notes, upcoming appointments, etc.  Non-urgent messages can be sent to your provider as well.   To learn more about what you can do with MyChart, go to [email or web address].    Your next appointment:   6 month(s)  The format for your next appointment:   Either in person or virtual visit  Provider:   Buford Dresser, MD  Other Instructions     Signed, Buford Dresser, MD PhD 08/19/2020 9:51 PM    [place]

## 2020-08-19 NOTE — Telephone Encounter (Signed)
°  Patient Consent for Virtual Visit         Shannon [name] has provided verbal consent on 08/19/2020 for a virtual visit (video or telephone).   CONSENT FOR VIRTUAL VISIT FOR:  Shannon [name]  By participating in this virtual visit I agree to the following:  I hereby voluntarily request, consent and authorize [facility] and its employed or contracted physicians, physician assistants, nurse practitioners or other licensed health care professionals (the Practitioner), to provide me with telemedicine health care services (the Services") as deemed necessary by the treating Practitioner. I acknowledge and consent to receive the Services by the Practitioner via telemedicine. I understand that the telemedicine visit will involve communicating with the Practitioner through live audiovisual communication technology and the disclosure of certain medical information by electronic transmission. I acknowledge that I have been given the opportunity to request an in-person assessment or other available alternative prior to the telemedicine visit and am voluntarily participating in the telemedicine visit.  I understand that I have the right to withhold or withdraw my consent to the use of telemedicine in the course of my care at any time, without affecting my right to future care or treatment, and that the Practitioner or I may terminate the telemedicine visit at any time. I understand that I have the right to inspect all information obtained and/or recorded in the course of the telemedicine visit and may receive copies of available information for a reasonable fee.  I understand that some of the potential risks of receiving the Services via telemedicine include:   Delay or interruption in medical evaluation due to technological equipment failure or disruption;  Information transmitted may not be sufficient (e.g. poor resolution of images) to allow for appropriate medical decision making by the Practitioner; and/or     In rare instances, security protocols could fail, causing a breach of personal health information.  Furthermore, I acknowledge that it is my responsibility to provide information about my medical history, conditions and care that is complete and accurate to the best of my ability. I acknowledge that Practitioner's advice, recommendations, and/or decision may be based on factors not within their control, such as incomplete or inaccurate data provided by me or distortions of diagnostic images or specimens that may result from electronic transmissions. I understand that the practice of medicine is not an exact science and that Practitioner makes no warranties or guarantees regarding treatment outcomes. I acknowledge that a copy of this consent can be made available to me via my patient portal ([name]), or I can request a printed copy by calling the office of [place].    I understand that my insurance will be billed for this visit.   I have read or had this consent read to me.  I understand the contents of this consent, which adequately explains the benefits and risks of the Services being provided via telemedicine.   I have been provided ample opportunity to ask questions regarding this consent and the Services and have had my questions answered to my satisfaction.  I give my informed consent for the services to be provided through the use of telemedicine in my medical care

## 2020-08-19 NOTE — Patient Instructions (Signed)
Medication Instructions:  Your physician recommends that you continue on your current medications as directed. Please refer to the Current Medication list given to you today.  *If you need a refill on your cardiac medications before your next appointment, please call your pharmacy*   Lab Work: NONE ordered at this time of appointment   If you have labs (blood work) drawn today and your tests are completely normal, you will receive your results only by: [facility] MyChart Message (if you have MyChart) OR . A paper copy in the mail If you have any lab test that is abnormal or we need to change your treatment, we will call you to review the results.   Testing/Procedures: NONE ordered at this time of appointment   Follow-Up: At [facility], you and your health needs are our priority.  As part of our continuing mission to provide you with exceptional heart care, we have created designated Provider Care Teams.  These Care Teams include your primary Cardiologist (physician) and Advanced Practice Providers (APPs -  Physician Assistants and Nurse Practitioners) who all work together to provide you with the care you need, when you need it.  We recommend signing up for the patient portal called "MyChart".  Sign up information is provided on this After Visit Summary.  MyChart is used to connect with patients for Virtual Visits (Telemedicine).  Patients are able to view lab/test results, encounter notes, upcoming appointments, etc.  Non-urgent messages can be sent to your provider as well.   To learn more about what you can do with MyChart, go to [email or web address].    Your next appointment:   6 month(s)  The format for your next appointment:   Either in person or virtual visit  Provider:   Buford Dresser, MD  Other Instructions

## 2020-09-18 ENCOUNTER — Other Ambulatory Visit (HOSPITAL_COMMUNITY): Payer: Self-pay | Admitting: Interventional Radiology

## 2020-09-18 ENCOUNTER — Telehealth (HOSPITAL_COMMUNITY): Payer: Self-pay | Admitting: Radiology

## 2020-09-18 DIAGNOSIS — I771 Stricture of artery: Secondary | ICD-10-CM

## 2020-09-18 NOTE — Telephone Encounter (Signed)
Pt called and states that she is having symptoms. She states she had a headache and dizziness last night. Went to bed. Woke up to go to bathroom and was so dizzy that she had to hold on the wall to walk. This morning she feels weak, groggy, having sharp pains in the right side of her head that has now become a dull constant right sided headache. She would like a call from a PA to discuss what she should do. Gave this information to [facility] PA. They will call the patient back today. JM

## 2020-09-21 ENCOUNTER — Encounter: Payer: Self-pay | Admitting: Cardiology

## 2020-09-22 ENCOUNTER — Other Ambulatory Visit: Payer: Self-pay | Admitting: Student

## 2020-09-23 ENCOUNTER — Ambulatory Visit (HOSPITAL_COMMUNITY)
Admission: RE | Admit: 2020-09-23 | Discharge: 2020-09-23 | Disposition: A | Discharge status: Home / Self Care | Payer: Medicare Other | Source: Ambulatory Visit | Attending: Interventional Radiology | Admitting: Interventional Radiology

## 2020-09-23 ENCOUNTER — Other Ambulatory Visit: Payer: Self-pay

## 2020-09-23 DIAGNOSIS — I6502 Occlusion and stenosis of left vertebral artery: Secondary | ICD-10-CM | POA: Diagnosis not present

## 2020-09-23 DIAGNOSIS — Z8249 Family history of ischemic heart disease and other diseases of the circulatory system: Secondary | ICD-10-CM | POA: Diagnosis not present

## 2020-09-23 DIAGNOSIS — Z85828 Personal history of other malignant neoplasm of skin: Secondary | ICD-10-CM | POA: Diagnosis not present

## 2020-09-23 DIAGNOSIS — Z823 Family history of stroke: Secondary | ICD-10-CM | POA: Insufficient documentation

## 2020-09-23 DIAGNOSIS — I771 Stricture of artery: Secondary | ICD-10-CM

## 2020-09-23 DIAGNOSIS — M1990 Unspecified osteoarthritis, unspecified site: Secondary | ICD-10-CM | POA: Diagnosis not present

## 2020-09-23 DIAGNOSIS — T82856A Stenosis of peripheral vascular stent, initial encounter: Secondary | ICD-10-CM | POA: Diagnosis not present

## 2020-09-23 DIAGNOSIS — E039 Hypothyroidism, unspecified: Secondary | ICD-10-CM | POA: Insufficient documentation

## 2020-09-23 DIAGNOSIS — Z882 Allergy status to sulfonamides status: Secondary | ICD-10-CM | POA: Diagnosis not present

## 2020-09-23 DIAGNOSIS — Z79899 Other long term (current) drug therapy: Secondary | ICD-10-CM | POA: Insufficient documentation

## 2020-09-23 DIAGNOSIS — Z95828 Presence of other vascular implants and grafts: Secondary | ICD-10-CM | POA: Insufficient documentation

## 2020-09-23 DIAGNOSIS — Z888 Allergy status to other drugs, medicaments and biological substances status: Secondary | ICD-10-CM | POA: Insufficient documentation

## 2020-09-23 DIAGNOSIS — I668 Occlusion and stenosis of other cerebral arteries: Secondary | ICD-10-CM | POA: Diagnosis not present

## 2020-09-23 DIAGNOSIS — K219 Gastro-esophageal reflux disease without esophagitis: Secondary | ICD-10-CM | POA: Insufficient documentation

## 2020-09-23 DIAGNOSIS — Z79890 Hormone replacement therapy (postmenopausal): Secondary | ICD-10-CM | POA: Diagnosis not present

## 2020-09-23 DIAGNOSIS — Z8673 Personal history of transient ischemic attack (TIA), and cerebral infarction without residual deficits: Secondary | ICD-10-CM | POA: Insufficient documentation

## 2020-09-23 DIAGNOSIS — I10 Essential (primary) hypertension: Secondary | ICD-10-CM | POA: Diagnosis not present

## 2020-09-23 DIAGNOSIS — Z90710 Acquired absence of both cervix and uterus: Secondary | ICD-10-CM | POA: Diagnosis not present

## 2020-09-23 DIAGNOSIS — Z7982 Long term (current) use of aspirin: Secondary | ICD-10-CM | POA: Insufficient documentation

## 2020-09-23 DIAGNOSIS — Y831 Surgical operation with implant of artificial internal device as the cause of abnormal reaction of the patient, or of later complication, without mention of misadventure at the time of the procedure: Secondary | ICD-10-CM | POA: Diagnosis not present

## 2020-09-23 HISTORY — PX: IR ANGIO INTRA EXTRACRAN SEL COM CAROTID INNOMINATE BILAT MOD SED: IMG5360

## 2020-09-23 HISTORY — PX: IR ANGIO VERTEBRAL SEL VERTEBRAL UNI R MOD SED: IMG5368

## 2020-09-23 HISTORY — PX: IR ANGIO VERTEBRAL SEL SUBCLAVIAN INNOMINATE UNI L MOD SED: IMG5364

## 2020-09-23 LAB — BASIC METABOLIC PANEL
Anion gap: 7 (ref 5–15)
BUN: 14 mg/dL (ref 8–23)
CO2: 25 mmol/L (ref 22–32)
Calcium: 9.5 mg/dL (ref 8.9–10.3)
Chloride: 105 mmol/L (ref 98–111)
Creatinine, Ser: 0.75 mg/dL (ref 0.44–1.00)
GFR, Estimated: >60 mL/min (ref >60)
Glucose, Bld: 87 mg/dL (ref 70–99)
Potassium: 6.4 mmol/L (ref 3.5–5.1)
Sodium: 137 mmol/L (ref 135–145)

## 2020-09-23 LAB — POCT I-STAT, CHEM 8
BUN: 13 mg/dL (ref 8–23)
Calcium, Ion: 1.21 mmol/L (ref 1.15–1.40)
Chloride: 108 mmol/L (ref 98–111)
Creatinine, Ser: 0.5 mg/dL (ref 0.44–1.00)
Glucose, Bld: 95 mg/dL (ref 70–99)
HCT: 35 % — ABNORMAL LOW (ref 36.0–46.0)
Hemoglobin: 11.9 g/dL — ABNORMAL LOW (ref 12.0–15.0)
Potassium: 3.8 mmol/L (ref 3.5–5.1)
Sodium: 144 mmol/L (ref 135–145)
TCO2: 23 mmol/L (ref 22–32)

## 2020-09-23 LAB — APTT: aPTT: 31 seconds (ref 24–36)

## 2020-09-23 LAB — CBC
HCT: 40.9 % (ref 36.0–46.0)
Hemoglobin: 13.8 g/dL (ref 12.0–15.0)
MCH: 31.7 pg (ref 26.0–34.0)
MCHC: 33.7 g/dL (ref 30.0–36.0)
MCV: 93.8 fL (ref 80.0–100.0)
Platelets: 210 10*3/uL (ref 150–400)
RBC: 4.36 MIL/uL (ref 3.87–5.11)
RDW: 11.6 % (ref 11.5–15.5)
WBC: 4 10*3/uL (ref 4.0–10.5)
nRBC: 0 % (ref 0.0–0.2)

## 2020-09-23 LAB — PROTIME-INR
INR: 1 (ref 0.8–1.2)
Prothrombin Time: 12.6 seconds (ref 11.4–15.2)

## 2020-09-23 MED ORDER — VERAPAMIL HCL 2.5 MG/ML IV SOLN
INTRAVENOUS | Status: AC
  Filled 2020-09-23: qty 2

## 2020-09-23 MED ORDER — FENTANYL CITRATE (PF) 100 MCG/2ML IJ SOLN
INTRAMUSCULAR | Status: AC | PRN
  Administered 2020-09-23: 25 ug via INTRAVENOUS

## 2020-09-23 MED ORDER — IOHEXOL 300 MG/ML  SOLN: 150.0000 mL | Freq: Once | INTRAMUSCULAR | Status: DC | PRN

## 2020-09-23 MED ORDER — MIDAZOLAM HCL 2 MG/2ML IJ SOLN
INTRAMUSCULAR | Status: AC
  Filled 2020-09-23: qty 2

## 2020-09-23 MED ORDER — SODIUM CHLORIDE 0.9 % IV SOLN: INTRAVENOUS | Status: DC

## 2020-09-23 MED ORDER — FENTANYL CITRATE (PF) 100 MCG/2ML IJ SOLN
INTRAMUSCULAR | Status: AC
  Filled 2020-09-23: qty 2

## 2020-09-23 MED ORDER — LIDOCAINE HCL (PF) 1 % IJ SOLN
INTRAMUSCULAR | Status: AC
  Administered 2020-09-23: 10 mL
  Filled 2020-09-23: qty 30

## 2020-09-23 MED ORDER — HEPARIN SODIUM (PORCINE) 1000 UNIT/ML IJ SOLN
INTRAMUSCULAR | Status: AC
  Filled 2020-09-23: qty 1

## 2020-09-23 MED ORDER — HEPARIN SODIUM (PORCINE) 1000 UNIT/ML IJ SOLN
INTRAMUSCULAR | Status: AC | PRN
  Administered 2020-09-23: 500 [IU] via INTRAVENOUS

## 2020-09-23 MED ORDER — SODIUM CHLORIDE 0.9 % IV SOLN: INTRAVENOUS | Status: AC

## 2020-09-23 MED ORDER — MIDAZOLAM HCL 2 MG/2ML IJ SOLN
INTRAMUSCULAR | Status: AC | PRN
  Administered 2020-09-23: 1 mg via INTRAVENOUS

## 2020-09-23 NOTE — Progress Notes (Signed)
Patient and husband was given discharge instructions. Both verbalized understanding. 

## 2020-09-23 NOTE — Discharge Instructions (Signed)
Femoral Site Care This sheet gives you information about how to care for yourself after your procedure. Your health care provider may also give you more specific instructions. If you have problems or questions, contact your health care provider. What can I expect after the procedure? After the procedure, it is common to have:  Bruising that usually fades within 1-2 weeks.  Tenderness at the site. Follow these instructions at home: Wound care  Follow instructions from your health care provider about how to take care of your insertion site. Make sure you: ? Wash your hands with soap and water before you change your bandage (dressing). If soap and water are not available, use hand sanitizer. ? Change your dressing as told by your health care provider. ? Leave stitches (sutures), skin glue, or adhesive strips in place. These skin closures may need to stay in place for 2 weeks or longer. If adhesive strip edges start to loosen and curl up, you may trim the loose edges. Do not remove adhesive strips completely unless your health care provider tells you to do that.  Do not take baths, swim, or use a hot tub until your health care provider approves.  You may shower 24-48 hours after the procedure or as told by your health care provider. ? Gently wash the site with plain soap and water. ? Pat the area dry with a clean towel. ? Do not rub the site. This may cause bleeding.  Do not apply powder or lotion to the site. Keep the site clean and dry.  Check your femoral site every day for signs of infection. Check for: ? Redness, swelling, or pain. ? Fluid or blood. ? Warmth. ? Pus or a bad smell. Activity  For the first 2-3 days after your procedure, or as long as directed: ? Avoid climbing stairs as much as possible. ? Do not squat.  Do not lift anything that is heavier than 10 lb (4.5 kg), or the limit that you are told, until your health care provider says that it is safe.  Rest as  directed. ? Avoid sitting for a long time without moving. Get up to take short walks every 1-2 hours.  Do not drive for 24 hours if you were given a medicine to help you relax (sedative). General instructions  Take over-the-counter and prescription medicines only as told by your health care provider.  Keep all follow-up visits as told by your health care provider. This is important. Contact a health care provider if you have:  A fever or chills.  You have redness, swelling, or pain around your insertion site. Get help right away if:  The catheter insertion area swells very fast.  You pass out.  You suddenly start to sweat or your skin gets clammy.  The catheter insertion area is bleeding, and the bleeding does not stop when you hold steady pressure on the area.  The area near or just beyond the catheter insertion site becomes pale, cool, tingly, or numb. These symptoms may represent a serious problem that is an emergency. Do not wait to see if the symptoms will go away. Get medical help right away. Call your local emergency services (911 in the [place] Do not drive yourself to the hospital. Summary  After the procedure, it is common to have bruising that usually fades within 1-2 weeks.  Check your femoral site every day for signs of infection.  Do not lift anything that is heavier than 10 lb (4.5 kg), or the   limit that you are told, until your health care provider says that it is safe. This information is not intended to replace advice given to you by your health care provider. Make sure you discuss any questions you have with your health care provider. Document Revised: 12/12/2017 Document Reviewed: 12/12/2017 Elsevier Patient Education  2020 [employer]  

## 2020-09-23 NOTE — Sedation Documentation (Signed)
Pressure released from groin site at 1009, site is level 0, pulses palpable, 5 fr exoseal was used to close.

## 2020-09-23 NOTE — Procedures (Signed)
S/PLAN: 4 vessel cerebral arteriogram Rt CFA approach. Findings. 1..Severe Rt MCA intrastent stenosis.  2.Approx 90 % stenosis of Lt VA at origin and a 70 % stenosis LT VA at C4/5 3.approx 50 % stenosis RT PCA P1 seg. S.Rylie Limburg MD

## 2020-09-23 NOTE — H&P (Signed)
Chief Complaint: Headache, dizziness and balance issues. Request is for cerebral angiogram   Supervising Physician: Luanne Bras  Patient Status: [facility] - Out-pt  History of Present Illness: Tenelle Andreason is a 74 y.o. female 74 y.o. female outpatient. History of history of symptomatic right MCA stenosis symptomatic despite dual platelet anticoagulation. s/p revascularization of the right middle cerebral artery distal to M1 extending into the dominant inferior division proximally  with stent on 1.23.20. 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 Patient now with new onset of dizziness, with generalized weakness, balance issues  and persistent headaches. Patient presents for cerebral angiogram for further evaluation.  Patient states she was awaken from her sleep with a sudden onset of dizziness with balance issues ambulated to the bathroom. On Thursday she reports a "stabbing" pain in the general vicinity of  her right parietal lobe. Patient states that the pain improved with tylenol. Patient is currently reporting a "headachy" feeling bilateral temporal lobes. She denies any generalized weakness, nausea, vomiting or on going weakness.  Past Medical History:  Diagnosis Date  . Anxiety   . 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 Arthritis   . Cancer (HCC)    skin - squamous cell  . Complication of anesthesia    " I had difficulty coming out of it"  . Dyspnea    with exertion  . Family history of adverse reaction to anesthesia    " My daughter has a problem coming ou to it"  . GERD (gastroesophageal reflux disease)   . Headache    migraines in the past  . Hypertension   . Hypothyroidism   . Stroke ([place]) 11/2018   some weakness on right side   . Thyroid disease     Past Surgical History:  Procedure Laterality Date  . ABDOMINAL HYSTERECTOMY    . APPENDECTOMY    . CARDIAC  CATHETERIZATION    . CARPAL TUNNEL RELEASE Bilateral   . COLONOSCOPY    . DILATION AND CURETTAGE OF UTERUS    . IR ANGIO INTRA EXTRACRAN SEL COM CAROTID INNOMINATE BILAT MOD SED  12/25/2018  . IR ANGIO INTRA EXTRACRAN SEL COM CAROTID INNOMINATE BILAT MOD SED  10/30/2019  . IR ANGIO VERTEBRAL SEL SUBCLAVIAN INNOMINATE UNI L MOD SED  12/25/2018  . IR ANGIO VERTEBRAL SEL SUBCLAVIAN INNOMINATE UNI L MOD SED  10/30/2019  . IR ANGIO VERTEBRAL SEL SUBCLAVIAN INNOMINATE UNI R MOD SED  01/04/2019  . IR ANGIO VERTEBRAL SEL VERTEBRAL UNI R MOD SED  12/25/2018  . IR ANGIO VERTEBRAL SEL VERTEBRAL UNI R MOD SED  10/30/2019  . IR CT HEAD LTD  01/04/2019  . IR CT HEAD LTD  11/21/2019  . IR INTRA CRAN STENT  01/04/2019  . IR INTRA CRAN STENT  11/21/2019  . IR US GUIDE VASC ACCESS RIGHT  12/25/2018  . IR US GUIDE VASC ACCESS RIGHT  11/02/2019  . OVARIAN CYST REMOVAL    . RADIOLOGY WITH ANESTHESIA N/A 01/04/2019   Procedure: RMCA angioplasty with possible stenting;  Surgeon: Luanne Bras, MD;  Location: [place];  Service: Radiology;  Laterality: N/A;  . RADIOLOGY WITH ANESTHESIA N/A 11/21/2019   Procedure: IR WITH ANESTHESIA/ STENT PLACEMENT;  Surgeon: Luanne Bras, MD;  Location: [place];  Service: Radiology;  Laterality: N/A;    Allergies: Elastic bandages & [zinc], Sulfa antibiotics, Mandol [cefamandole], and Sinutab non-drowsy max [pseudoephedrine-acetaminophen]  Medications: Prior to  Admission medications   Medication Sig Start Date End Date Taking? Authorizing Provider  acetaminophen (TYLENOL) 325 MG tablet Take 650 mg by mouth every 6 (six) hours as needed for headache.   Yes [provider]  amLODipine (NORVASC) 2.5 MG tablet Take 1 tablet (2.5 mg total) by mouth daily. 08/19/20  Yes Buford Dresser, MD  aspirin EC 81 MG EC tablet Take 1 tablet (81 mg total) by mouth daily. 12/10/18  Yes Mayo, Pete Pelt, MD  atorvastatin (LIPITOR) 40 MG tablet Take 1 tablet (40 mg total) by mouth  daily at 6 PM. 12/09/18  Yes Mayo, Pete Pelt, MD  busPIRone (BUSPAR) 5 MG tablet Take 5 mg by mouth daily as needed (anxiety).  10/12/19  Yes [provider]  Calcium Carb-Cholecalciferol (CALCIUM 600 + D PO) Take 0.5-1 tablets by mouth See admin instructions. Take 1 tablet after breakfast, 1 tablet after lunch, and 0.5 tablet after dinner   Yes [provider]  Carboxymethylcellulose Sodium (ARTIFICIAL TEARS OP) Place 1 drop into both eyes daily as needed (dry eyes).   Yes [provider]  Coenzyme Q10 50 MG CAPS Take 50 mg by mouth daily.    Yes [provider]  levothyroxine (SYNTHROID) 75 MCG tablet Take 75 mcg by mouth every Tuesday, Thursday, Saturday, and Sunday.    Yes [provider]  levothyroxine (SYNTHROID, LEVOTHROID) 50 MCG tablet Take 50 mcg by mouth every Monday, Wednesday, and Friday.  04/24/12  Yes [provider]  loratadine (CLARITIN) 10 MG tablet Take 10 mg by mouth daily.   Yes [provider]  Multiple Vitamin (MULTIVITAMIN) capsule Take 1 capsule by mouth daily. Senior 04/24/12  Yes [provider]  ticagrelor (BRILINTA) 90 MG TABS tablet Take 1 tablet (90 mg total) by mouth 2 (two) times daily. 12/27/18  Yes Rehman, Areeg N, DO  vitamin E 400 UNIT capsule Take 400 Units by mouth every other day.    Yes [provider]     Family History  Problem Relation Age of Onset  . CVA Mother   . Heart disease Mother   . Peripheral Artery Disease Father   . CVA Maternal Aunt     Social History   Socioeconomic History  . Marital status: Married    Spouse name: Not on file  . Number of children: Not on file  . Years of education: Not on file  . Highest education level: Not on file  Occupational History  . Occupation: retired  Tobacco Use  . Smoking status: Never Smoker  . Smokeless tobacco: Never Used  Vaping Use  . Vaping Use: Never used  Substance and Sexual Activity  . Alcohol use: Never    . Drug use: Never  . Sexual activity: Yes  Other Topics Concern  . Not on file  Social History Narrative  . Not on file   Social Determinants of Health   Financial Resource Strain:   . Difficulty of Paying Living Expenses: Not on file  Food Insecurity:   . Worried About Charity fundraiser in the Last Year: Not on file  . Ran Out of Food in the Last Year: Not on file  Transportation Needs:   . Lack of Transportation (Medical): Not on file  . Lack of Transportation (Non-Medical): Not on file  Physical Activity:   . Days of Exercise per Week: Not on file  . Minutes of Exercise per Session: Not on file  Stress:   . Feeling of Stress :  Not on file  Social Connections:   . Frequency of Communication with Friends and Family: Not on file  . Frequency of Social Gatherings with Friends and Family: Not on file  . Attends Religious Services: Not on file  . Active Member of Clubs or Organizations: Not on file  . Attends Archivist Meetings: Not on file  . Marital Status: Not on file    Review of Systems: A 12 point ROS discussed and pertinent positives are indicated in the HPI above.  All other systems are negative.  Review of Systems  Constitutional: Negative for fatigue and fever.  HENT: Negative for congestion.   Respiratory: Negative for cough and shortness of breath.   Gastrointestinal: Negative for abdominal pain, diarrhea, nausea and vomiting.  Neurological: Positive for headaches ( bilateral temporal lobes).  Hematological: Bruises/bleeds easily.    Vital Signs: BP 138/65   Pulse 85   Temp (!) 97.5 F (36.4 C) (Oral)   Resp 14   Ht 5\' 5"  (1.651 m)   Wt 116 lb (52.6 kg)   SpO2 100%   BMI 19.30 kg/m   Physical Exam Vitals and nursing note reviewed.  Constitutional:      Appearance: She is well-developed.  HENT:     Head: Normocephalic and atraumatic.  Eyes:     Conjunctiva/sclera: Conjunctivae normal.  Cardiovascular:     Rate and Rhythm: Normal  rate and regular rhythm.     Heart sounds: Normal heart sounds.  Pulmonary:     Effort: Pulmonary effort is normal.  Musculoskeletal:        General: Normal range of motion.     Cervical back: Normal range of motion.  Skin:    General: Skin is warm.     Findings: Bruising present.  Neurological:     Mental Status: She is alert and oriented to person, place, and time.     Comments: Alert, aware and oriented X 3 Speech and comprehension is intact.  PERRL bilaterally No facial droop noted Tongue midline Can spontaneously move all 4 extremities. Hand grip strength equal bilaterally. Fine motor and coordination slow but intact.  Radial pulses palpable bilaterally   Speech, cognition and language are intact.  Comprehension and fluency are normal.  Judgment and insight normal  Gaitnot assessed Rombergnot assessed Heel to toenot assessed Distal pulsesnot assessed      Imaging: No results found.  Labs:  CBC: Recent Labs    09/30/19 1833 10/30/19 0631 11/21/19 0635 11/22/19 0500  WBC 4.0 4.4 3.7* 6.2  HGB 13.2 14.6 13.3 11.1*  HCT 39.5 43.6 39.9 32.5*  PLT 185 199 187 173    COAGS: Recent Labs    10/30/19 0631 11/21/19 0635 11/21/19 1400  INR 1.0 0.9  --   APTT  --  30 >200*    BMP: Recent Labs    09/30/19 1833 09/30/19 1833 10/30/19 0631 11/21/19 0635 11/22/19 0500 04/01/20 0858  NA 140  --  142 141 137  --   K 4.1  --  4.2 4.0 3.0*  --   CL 107  --  104 105 108  --   CO2 23  --  26 25 21*  --   GLUCOSE 107*  --  93 89 89  --   BUN 18  --  19 15 6*  --   CALCIUM 9.5  --  9.8 9.9 8.0*  --   CREATININE 0.79   < > 0.71 0.74 0.59 0.70  GFRNONAA >60  --  >  60 >60 >60  --   GFRAA >60  --  >60 >60 >60  --    < > = values in this interval not displayed.    Assessment and Plan:  74 y.o. female outpatient. History of history of symptomatic right MCA stenosis symptomatic despite dual platelet anticoagulation. s/p revascularization of the right  middle cerebral artery distal to M1 extending into the dominant inferior division proximally  with stent on 1.23.20. 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 Patient now with new onset of dizziness, with generalized weakness, balance issues  and persistent headaches. Patient presents for cerebral angiogram for further evaluation.    CT Angio Neck from 4.20.21 reads Right MCA stenting extended proximally from the prior CTA. Improved right MCA M1 enhancement compared to 10/15/2019, but evidence of continued severe stenosis at the right MCA origin (series 15, image 16), and right MCA branch appearance has not improved. Patient is on Brilliant 90 mg daily. All pertinent labs are with acceptable parameters. Patient is allergic to Elastic bandages and Sulfa. Patient has been NPO since midnight. Last dose of Brillinta taken on 10.12.21  Risks and benefits of cerebral angiogram were discussed with the patient including, but not limited to bleeding, infection, vascular injury or contrast induced renal failure.  This interventional procedure involves the use of X-rays and because of the nature of the planned procedure, it is possible that we will have prolonged use of X-ray fluoroscopy.  Potential radiation risks to you include (but are not limited to) the following: - A slightly elevated risk for cancer  several years later in life. This risk is typically less than 0.5% percent. This risk is low in comparison to the normal incidence of human cancer, which is 33% for women and 50% for men according to the [place]. - Radiation induced injury can include skin redness, resembling a rash, tissue breakdown / ulcers and hair loss (which can be temporary or permanent).   The likelihood of either of these occurring depends on the difficulty of the procedure and whether you are sensitive to radiation  due to previous procedures, disease, or genetic conditions.   IF your procedure requires a prolonged use of radiation, you will be notified and given written instructions for further action.  It is your responsibility to monitor the irradiated area for the 2 weeks following the procedure and to notify your physician if you are concerned that you have suffered a radiation induced injury.    All of the patient's questions were answered, patient is agreeable to proceed.  Consent signed and in chart.      Thank you for this interesting consult.  I greatly enjoyed meeting Dayan Kreis and look forward to participating in their care.  A copy of this report was sent to the requesting provider on this date.  Electronically Signed: Jacqualine Mau, NP 09/23/2020, 7:21 AM   I spent a total of  30 Minutes   in face to face in clinical consultation, greater than 50% of which was counseling/coordinating care for cerebral angiogram,

## 2020-09-25 ENCOUNTER — Other Ambulatory Visit (HOSPITAL_COMMUNITY): Payer: Self-pay | Admitting: Interventional Radiology

## 2020-09-25 ENCOUNTER — Encounter (HOSPITAL_COMMUNITY): Payer: Self-pay

## 2020-09-25 DIAGNOSIS — I771 Stricture of artery: Secondary | ICD-10-CM

## 2020-09-26 ENCOUNTER — Other Ambulatory Visit (HOSPITAL_COMMUNITY): Payer: Self-pay | Admitting: Interventional Radiology

## 2020-09-26 DIAGNOSIS — I771 Stricture of artery: Secondary | ICD-10-CM

## 2020-09-27 ENCOUNTER — Other Ambulatory Visit (HOSPITAL_COMMUNITY)
Admission: RE | Admit: 2020-09-27 | Discharge: 2020-09-27 | Disposition: A | Discharge status: Home / Self Care | Payer: Medicare Other | Source: Ambulatory Visit | Attending: Interventional Radiology | Admitting: Interventional Radiology

## 2020-09-27 DIAGNOSIS — Z01812 Encounter for preprocedural laboratory examination: Secondary | ICD-10-CM | POA: Insufficient documentation

## 2020-09-27 DIAGNOSIS — Z20822 Contact with and (suspected) exposure to covid-19: Secondary | ICD-10-CM | POA: Diagnosis not present

## 2020-09-27 LAB — SARS CORONAVIRUS 2 (TAT 6-24 HRS): SARS Coronavirus 2: NEGATIVE

## 2020-09-29 ENCOUNTER — Other Ambulatory Visit (HOSPITAL_COMMUNITY): Payer: Self-pay | Admitting: Radiology

## 2020-09-29 ENCOUNTER — Telehealth: Payer: Self-pay | Admitting: Student

## 2020-09-29 DIAGNOSIS — I771 Stricture of artery: Secondary | ICD-10-CM

## 2020-09-29 DIAGNOSIS — I639 Cerebral infarction, unspecified: Secondary | ICD-10-CM

## 2020-09-29 LAB — PLATELET INHIBITION P2Y12: Platelet Function  P2Y12: 2 [PRU] — ABNORMAL LOW (ref 182–335)

## 2020-09-29 NOTE — Telephone Encounter (Signed)
NIR.  P2Y12  Today = 2 PRU. Discussed with Dr. Estanislado Pandy who recommends medication adjustments- currently taking Brilinta 90 mg twice daily and Aspirin 81 mg once daily; continue Aspirin 81 mg once daily and begin taking Brilint a 45 mg twice daily (1/2 tablet twice daily). Called patient at 1120 [phone]) to discuss above. All questions answered and concerns addressed. Patient conveys understanding and agrees with plan.  Please call NIR with questions/concerns.   Bea Graff Dell Briner, PA-C 09/29/2020, 11:31 AM

## 2020-09-30 ENCOUNTER — Other Ambulatory Visit: Payer: Self-pay

## 2020-09-30 ENCOUNTER — Encounter (HOSPITAL_COMMUNITY): Payer: Self-pay | Admitting: Interventional Radiology

## 2020-09-30 ENCOUNTER — Other Ambulatory Visit: Payer: Self-pay | Admitting: Cardiology

## 2020-09-30 ENCOUNTER — Other Ambulatory Visit: Payer: Self-pay | Admitting: Student

## 2020-09-30 NOTE — Progress Notes (Signed)
PCP - Donalynn Furlong, MD Cardiologist - Harrell Gave, MD   Chest x-ray - n/a EKG - 04/09/20 Stress Test - pt denies ECHO - 12/09/18 Cardiac Cath - pt denies   Blood Thinner Instructions: Follow your surgeon's instructions on when to stop Aspirin and ticagrelor (BRILINTA)  .  If no instructions were given by your surgeon then you will need to call the office to get those instructions.    -- per pt, and surgeon's orders - continue to take ASA and take half of Brilinta dose  Aspirin Instructions: see above  COVID TEST- 09/27/20  Coronavirus Screening  Have you experienced the following symptoms:  Cough yes/no: No Fever (>100.74F)  yes/no: No Runny nose yes/no: No Sore throat yes/no: No Difficulty breathing/shortness of breath  yes/no: No  Have you or a family member traveled in the last 14 days and where? yes/no: No   If the patient indicates "YES" to the above questions, their PAT will be rescheduled to limit the exposure to others and, the surgeon will be notified. THE PATIENT WILL NEED TO BE ASYMPTOMATIC FOR 14 DAYS.   If the patient is not experiencing any of these symptoms, the PAT nurse will instruct them to NOT bring anyone with them to their appointment since they may have these symptoms or traveled as well.   Please remind your patients and families that hospital visitation restrictions are in effect and the importance of the restrictions.     Anesthesia review: n/a   -------------  SDW INSTRUCTIONS:  Your procedure is scheduled on 10/01/20.  Report to [facility] Main Entrance "A" at 06:00 A.M., and check in at the Admitting office.  Call this number if you have problems the morning of surgery: [phone]   Remember: Do not eat or drink after midnight the night before your surgery  Take these medicines the morning of surgery with A SIP OF WATER: acetaminophen (TYLENOL)  amLODipine (NORVASC)  busPIRone (BUSPAR) if needed levothyroxine (SYNTHROID,  LEVOTHROID) 50 mcg loratadine (CLARITIN)   Follow your surgeon's instructions on when to stop Aspirin and ticagrelor (BRILINTA).  If no instructions were given by your surgeon then you will need to call the office to get those instructions.    -- per pt, and surgeon's orders - continue to take ASA and take half of Brilinta dose  As of today, STOP taking any  Aleve, Naproxen, Ibuprofen, Motrin, Advil, Goody's, BC's, all herbal medications, fish oil, and all vitamins.    The Morning of Surgery  Do not wear jewelry, make-up or nail polish.  Do not wear lotions, powders, or perfumes, or deodorant  Do not shave 48 hours prior to surgery.    Do not bring valuables to the hospital.  [facility] is not responsible for any belongings or valuables.  If you are a smoker, DO NOT Smoke 24 hours prior to surgery  If you wear a CPAP at night please bring your mask the morning of surgery   Remember that you must have someone to transport you home after your surgery, and remain with you for 24 hours if you are discharged the same day.   Please bring cases for contacts, glasses, hearing aids, dentures or bridgework because it cannot be worn into surgery.    Leave your suitcase in the car.  After surgery it may be brought to your room.  For patients admitted to the hospital, discharge time will be determined by your treatment team.  Patients discharged the day of surgery will  not be allowed to drive home.    Special instructions:   [place]- Preparing For Surgery  Oral Hygiene is also important to reduce your risk of infection.  Remember - BRUSH YOUR TEETH THE MORNING OF SURGERY WITH YOUR REGULAR TOOTHPASTE  Please follow these instructions carefully.   1. Shower the NIGHT BEFORE SURGERY and the MORNING OF SURGERY with DIAL Soap.   2. Wash thoroughly, paying special attention to the area where your surgery will be performed.  3. Thoroughly rinse your body with warm water from the neck  down.  4. Pat yourself dry with a CLEAN TOWEL.  5. Wear CLEAN PAJAMAS to bed the night before surgery  6. Place CLEAN SHEETS on your bed the night of your first shower and DO NOT SLEEP WITH PETS.  7. Wear comfortable clothes the morning of surgery.    Day of Surgery:  Please shower the morning of surgery with the DIAL soap Do not apply any deodorants/lotions. Please wear clean clothes to the hospital/surgery center.   Remember to brush your teeth WITH YOUR REGULAR TOOTHPASTE.   Please read over the following fact sheets that you were given.  Patient denies shortness of breath, fever, cough and chest pain.

## 2020-10-01 ENCOUNTER — Other Ambulatory Visit: Payer: Self-pay

## 2020-10-01 ENCOUNTER — Encounter (HOSPITAL_COMMUNITY): Payer: Self-pay

## 2020-10-01 ENCOUNTER — Encounter (HOSPITAL_COMMUNITY): Payer: Self-pay | Admitting: Certified Registered Nurse Anesthetist

## 2020-10-01 ENCOUNTER — Ambulatory Visit (HOSPITAL_COMMUNITY)
Admission: RE | Admit: 2020-10-01 | Discharge: 2020-10-01 | Disposition: A | Discharge status: Home / Self Care | Payer: Medicare Other | Attending: Interventional Radiology | Admitting: Interventional Radiology

## 2020-10-01 ENCOUNTER — Encounter (HOSPITAL_COMMUNITY)
Admission: RE | Disposition: A | Discharge status: Home / Self Care | Payer: Self-pay | Source: Home / Self Care | Attending: Interventional Radiology

## 2020-10-01 ENCOUNTER — Encounter (HOSPITAL_COMMUNITY): Payer: Self-pay | Admitting: Interventional Radiology

## 2020-10-01 ENCOUNTER — Ambulatory Visit (HOSPITAL_COMMUNITY)
Admission: RE | Admit: 2020-10-01 | Discharge: 2020-10-01 | Disposition: A | Discharge status: Home / Self Care | Payer: Medicare Other | Source: Ambulatory Visit | Attending: Interventional Radiology | Admitting: Interventional Radiology

## 2020-10-01 DIAGNOSIS — T82856A Stenosis of peripheral vascular stent, initial encounter: Secondary | ICD-10-CM | POA: Diagnosis present

## 2020-10-01 DIAGNOSIS — Z538 Procedure and treatment not carried out for other reasons: Secondary | ICD-10-CM | POA: Insufficient documentation

## 2020-10-01 DIAGNOSIS — Y828 Other medical devices associated with adverse incidents: Secondary | ICD-10-CM | POA: Diagnosis not present

## 2020-10-01 HISTORY — PX: RADIOLOGY WITH ANESTHESIA: SHX6223

## 2020-10-01 LAB — APTT: aPTT: 32 seconds (ref 24–36)

## 2020-10-01 LAB — CBC WITH DIFFERENTIAL/PLATELET
Abs Immature Granulocytes: 0 10*3/uL (ref 0.00–0.07)
Basophils Absolute: 0 10*3/uL (ref 0.0–0.1)
Basophils Relative: 1 %
Eosinophils Absolute: 0 10*3/uL (ref 0.0–0.5)
Eosinophils Relative: 1 %
HCT: 39.8 % (ref 36.0–46.0)
Hemoglobin: 13.5 g/dL (ref 12.0–15.0)
Immature Granulocytes: 0 %
Lymphocytes Relative: 41 %
Lymphs Abs: 1.3 10*3/uL (ref 0.7–4.0)
MCH: 31.6 pg (ref 26.0–34.0)
MCHC: 33.9 g/dL (ref 30.0–36.0)
MCV: 93.2 fL (ref 80.0–100.0)
Monocytes Absolute: 0.4 10*3/uL (ref 0.1–1.0)
Monocytes Relative: 11 %
Neutro Abs: 1.5 10*3/uL — ABNORMAL LOW (ref 1.7–7.7)
Neutrophils Relative %: 46 %
Platelets: 181 10*3/uL (ref 150–400)
RBC: 4.27 MIL/uL (ref 3.87–5.11)
RDW: 11.6 % (ref 11.5–15.5)
WBC: 3.3 10*3/uL — ABNORMAL LOW (ref 4.0–10.5)
nRBC: 0 % (ref 0.0–0.2)

## 2020-10-01 LAB — BASIC METABOLIC PANEL
Anion gap: 8 (ref 5–15)
BUN: 14 mg/dL (ref 8–23)
CO2: 28 mmol/L (ref 22–32)
Calcium: 9.7 mg/dL (ref 8.9–10.3)
Chloride: 105 mmol/L (ref 98–111)
Creatinine, Ser: 0.73 mg/dL (ref 0.44–1.00)
GFR, Estimated: >60 mL/min (ref >60)
Glucose, Bld: 90 mg/dL (ref 70–99)
Potassium: 3.9 mmol/L (ref 3.5–5.1)
Sodium: 141 mmol/L (ref 135–145)

## 2020-10-01 LAB — PROTIME-INR
INR: 1 (ref 0.8–1.2)
Prothrombin Time: 13.2 seconds (ref 11.4–15.2)

## 2020-10-01 LAB — PLATELET INHIBITION P2Y12: Platelet Function  P2Y12: 3 [PRU] — ABNORMAL LOW (ref 182–335)

## 2020-10-01 SURGERY — IR WITH ANESTHESIA
Anesthesia: General

## 2020-10-01 MED ORDER — ORAL CARE MOUTH RINSE: 15.0000 mL | Freq: Once | OROMUCOSAL | Status: AC

## 2020-10-01 MED ORDER — NIMODIPINE 30 MG PO CAPS: 0.0000 mg | ORAL_CAPSULE | ORAL | Status: DC

## 2020-10-01 MED ORDER — FENTANYL CITRATE (PF) 100 MCG/2ML IJ SOLN
INTRAMUSCULAR | Status: AC
  Filled 2020-10-01: qty 2

## 2020-10-01 MED ORDER — ASPIRIN EC 81 MG PO TBEC: 81.0000 mg | DELAYED_RELEASE_TABLET | Freq: Once | ORAL | Status: DC

## 2020-10-01 MED ORDER — TICAGRELOR 90 MG PO TABS: 90.0000 mg | ORAL_TABLET | Freq: Once | ORAL | Status: DC

## 2020-10-01 MED ORDER — SODIUM CHLORIDE 0.9 % IV SOLN: INTRAVENOUS | Status: DC

## 2020-10-01 MED ORDER — CHLORHEXIDINE GLUCONATE 0.12 % MT SOLN
15.0000 mL | Freq: Once | OROMUCOSAL | Status: AC
  Administered 2020-10-01: 15 mL via OROMUCOSAL
  Filled 2020-10-01: qty 15

## 2020-10-01 MED ORDER — VANCOMYCIN HCL 1000 MG IV SOLR: 1000.0000 mg | INTRAVENOUS | Status: DC

## 2020-10-01 MED ORDER — VANCOMYCIN HCL IN DEXTROSE 1-5 GM/200ML-% IV SOLN
1000.0000 mg | Freq: Once | INTRAVENOUS | Status: DC
  Filled 2020-10-01: qty 200

## 2020-10-01 MED ORDER — LACTATED RINGERS IV SOLN: INTRAVENOUS | Status: DC

## 2020-10-01 NOTE — Anesthesia Preprocedure Evaluation (Deleted)
Anesthesia Evaluation  Patient identified by MRN, date of birth, ID band Patient awake    Reviewed: Allergy & Precautions, NPO status , Patient's Chart, lab work & pertinent test results  Airway Mallampati: II  TM Distance: >3 FB Neck ROM: Full    Dental  (+) Teeth Intact, Dental Advisory Given   Pulmonary    breath sounds clear to auscultation       Cardiovascular hypertension,  Rhythm:Regular Rate:Normal     Neuro/Psych    GI/Hepatic   Endo/Other    Renal/GU      Musculoskeletal   Abdominal   Peds  Hematology   Anesthesia Other Findings   Reproductive/Obstetrics                             Anesthesia Physical Anesthesia Plan  ASA: III  Anesthesia Plan: General   Post-op Pain Management:    Induction: Intravenous  PONV Risk Score and Plan: Ondansetron and Dexamethasone  Airway Management Planned:   Additional Equipment: Arterial line  Intra-op Plan:   Post-operative Plan: Extubation in OR  Informed Consent: I have reviewed the patients History and Physical, chart, labs and discussed the procedure including the risks, benefits and alternatives for the proposed anesthesia with the patient or authorized representative who has indicated his/her understanding and acceptance.     Dental advisory given  Plan Discussed with: CRNA and Anesthesiologist  Anesthesia Plan Comments:         Anesthesia Quick Evaluation

## 2020-10-01 NOTE — Progress Notes (Signed)
Patients procedure has been cancelled today due to a P2Y12 of 3.  Dr. Estanislado Pandy is talking to the patient.  Patient will be discharged home

## 2020-10-01 NOTE — Progress Notes (Signed)
NIR.  Patient was scheduled for an image-guided cerebral arteriogram with possible revascularization (angioplasty, stent placement) of right MCA intra-stent stenosis.  P2Y12 3 PRU this AM. Discussed with Dr. Estanislado Pandy who recommends postponing procedure at this time for safety reasons (increased risk of bleeding with this level). Recommend continuing medications as is at this time- Brilinta 45 mg twice daily and Aspirin 81 mg once daily. Dr. Estanislado Pandy to discuss with pharmacy to see if different doses can be compounded (?Brilinta 22.5 twice daily)- our office to call patient if further medication changes warranted. Patient to come [facility] Monday 10/25 or Tuesday 10/26 for repeat P2Y12- patient will be rescheduled for procedure based on P2Y12 result. Above was discussed between patient and Dr. Estanislado Pandy. Handout with medication instructions given to patient. Dr. Estanislado Pandy spoke with patient's husband, Tennis, via telephone to discuss above as well.  Please call NIR with questions/concerns.   Bea Graff Sinaya Minogue, PA-C 10/01/2020, 9:47 AM

## 2020-10-01 NOTE — Progress Notes (Signed)
Patient has two abrasions on her lower abdomen that has developed since her procedure from last week.  Patient stated that it came from the adhesive tape that was used after her procedure.  The areas are not red but are scabbed over.

## 2020-10-02 ENCOUNTER — Encounter (HOSPITAL_COMMUNITY): Payer: Self-pay | Admitting: Interventional Radiology

## 2020-10-06 ENCOUNTER — Telehealth: Payer: Self-pay | Admitting: Student

## 2020-10-06 ENCOUNTER — Other Ambulatory Visit: Payer: Self-pay | Admitting: Student

## 2020-10-06 ENCOUNTER — Other Ambulatory Visit (HOSPITAL_COMMUNITY)
Admission: RE | Admit: 2020-10-06 | Discharge: 2020-10-06 | Disposition: A | Discharge status: Home / Self Care | Payer: Medicare Other | Source: Ambulatory Visit | Attending: Interventional Radiology | Admitting: Interventional Radiology

## 2020-10-06 DIAGNOSIS — I771 Stricture of artery: Secondary | ICD-10-CM | POA: Insufficient documentation

## 2020-10-06 LAB — PLATELET INHIBITION P2Y12: Platelet Function  P2Y12: 13 [PRU] — ABNORMAL LOW (ref 182–335)

## 2020-10-06 NOTE — Telephone Encounter (Signed)
NIR.  P2Y12 13 PRU this AM. Discussed with Dr. Estanislado Pandy who recommends discontinuing Brilinta 45 mg twice daily, begin taking Brilinta 30 mg twice daily, continue taking Aspirin 81 mg once daily, and come back to [facility] Monday 10/13/2020 for repeat P2Y12. Called patient [phone]) at 1312 to discuss above. All questions answered and concerns addressed.  Called prescription into [place] [phone]) at 1316- Brilinta 60 mg tablets, take 1/2 tablet by mouth twice daily, dispense 30 tablets with 3 refills.   Bea Graff Treyvonne Tata, PA-C 10/06/2020, 1:27 PM

## 2020-10-13 ENCOUNTER — Other Ambulatory Visit (HOSPITAL_COMMUNITY)
Admission: RE | Admit: 2020-10-13 | Discharge: 2020-10-13 | Disposition: A | Discharge status: Home / Self Care | Payer: Medicare Other | Source: Ambulatory Visit | Attending: Interventional Radiology | Admitting: Interventional Radiology

## 2020-10-13 ENCOUNTER — Other Ambulatory Visit: Payer: Self-pay | Admitting: Student

## 2020-10-13 DIAGNOSIS — I771 Stricture of artery: Secondary | ICD-10-CM | POA: Insufficient documentation

## 2020-10-13 LAB — PLATELET INHIBITION P2Y12: Platelet Function  P2Y12: 16 [PRU] — ABNORMAL LOW (ref 182–335)

## 2020-10-30 ENCOUNTER — Encounter: Payer: Self-pay | Admitting: Adult Health

## 2020-10-30 ENCOUNTER — Ambulatory Visit (INDEPENDENT_AMBULATORY_CARE_PROVIDER_SITE_OTHER): Payer: Medicare Other | Admitting: Adult Health

## 2020-10-30 VITALS — BP 132/86 | HR 82 | Ht 64.0 in | Wt 117.0 lb

## 2020-10-30 DIAGNOSIS — I6381 Other cerebral infarction due to occlusion or stenosis of small artery: Secondary | ICD-10-CM | POA: Diagnosis not present

## 2020-10-30 DIAGNOSIS — E785 Hyperlipidemia, unspecified: Secondary | ICD-10-CM

## 2020-10-30 DIAGNOSIS — I10 Essential (primary) hypertension: Secondary | ICD-10-CM | POA: Diagnosis not present

## 2020-10-30 DIAGNOSIS — I6601 Occlusion and stenosis of right middle cerebral artery: Secondary | ICD-10-CM | POA: Diagnosis not present

## 2020-10-30 NOTE — Patient Instructions (Addendum)
Continue aspirin 81 mg daily and Brilinta  and atorvastatin  for secondary stroke prevention  Continue to follow with Dr. Estanislado Pandy and undergo procedure as scheduled  Please check you blood pressure during dizziness episodes as low blood pressure could be contributing to your dizziness and improving your blood pressure could help stop dizziness sensation  Continue to follow up with PCP regarding cholesterol and blood pressure management  Maintain strict control of hypertension with blood pressure goal below 130/90  and cholesterol with LDL cholesterol (bad cholesterol) goal below 70 mg/dL.      Followup in the future with me in 6 months or call earlier if needed     Thank you for coming to see [place] at [facility]. I hope we have been able to provide you high quality care today.  You may receive a patient satisfaction survey over the next few weeks. We would appreciate your feedback and comments so that we may continue to improve ourselves and the health of our patients.

## 2020-10-30 NOTE — Progress Notes (Signed)
[facility] [address] [place]. [place] [address] [phone]       OFFICE FOLLOW UP VISIT NOTE  [name] Date of Birth:  [birth year] Medical Record Number:  [id number]   Referring MD: Rosalin Hawking  Reason for Referral: Stroke  Chief complaint: Chief Complaint  Patient presents with  . Follow-up    stroke fu , rm 9, alone, reports dizzy spells      HPI:  Today, 10/30/2020, Shannon [name] returns for 74-month stroke follow-up unaccompanied.  She started to experience dizziness episodes again therefore contacted Dr. Arlean Hopping office and repeated cerebral arteriogram 09/23/2020 with Dr. Estanislado Pandy with reported severe right MCA IntraStent stenosis, L VA 70% stenosis and 90% origin stenosis in approximately R PCA 50% stenosis at P1 segment.  Initially scheduled cerebral arteriogram with possible revascularization on 10/01/2020 but P2 Y 12 3 with Brilinta dosage adjustment and postponed procedure currently rescheduled for 11/17/2020.  Currently on aspirin and Brilinta 30 mg twice daily for secondary stroke prevention and per VIR recommendations.  She did experience additional dizziness episode yesterday after walking her dog and resolved after laying down for 1 hour.  She did not check her blood pressure during this time. Remains on atorvastatin without myalgias.  Blood pressure today 144/63 currently on amlodipine 2.5 mg daily.  Recent LDL 45 11/8 by PCP.  No further concerns at this time.      History provided for reference purposes only Update 04/29/2020 JM: Shannon [name] returns for 74-month follow-up with history of right basal ganglia infarct in 12/2018 s/p right M1 angioplasty and stenting with repeat angioplasty and rescue stenting of severe IntraStent stenosis 11/21/2019 with Dr. Estanislado Pandy with continuation of aspirin 81 mg daily and Brilinta 90 mg twice daily.  Follow-up with NIR around 06/2020 for repeat imaging.  She has been doing well since that time without new or  reoccurring stroke/TIA symptoms.  Previously discussed dizziness episodes have been correlated with lower blood pressure readings.  Routine follow-up with cardiology with recent adjustments and typically range 100-130/60-70.  She has not had any recurrent dizziness episodes since March.  Blood pressure today initially 150/72 and on recheck 122/78.  Her main concern today is in regards to ongoing fatigue and decreased stamina as prior to her stroke, very active walking at least 2 miles daily.  She was able to slowly return to activity level after her stroke in 12/2018 but has had great difficulty since recent procedure in December.  Occasional shortness of breath with increased exertion such as walking uphill or climbing stairs.  Reports of occasional insomnia typically with increased anxiety regarding next day activities but otherwise sleeps without difficulty.  Does typically take daytime nap.  She has not previously underwent sleep study.  Will become mildly anxious or irritated depending on certain situation but otherwise denies depression or consistent anxiety.  No further concerns at this time.  Update 10/29/2019 Dr. Leonie Man : She returns for follow-up after last visit with me 8 months ago.  She has not had any recurrent TIA or stroke symptoms.  She remains on aspirin and Brilinta but does complain of easy bruising and bleeding.  In fact she hit her shin a month ago and had lot of subcutaneous ecchymosis and bruising.  She had a couple of episodes which she wants to clarify and not related to strokes.  On 02/15/2019 while they were driving back from [facility] she had transient vertigo which lasted few seconds.  She later had back to the feeling  passed.  She blames this on driving over winding roads.  On April 2 she had 2 brief episodes of numbness on the tip of nose which lasted only few seconds and resolved.  She also noticed an indentation in the right cheek in August which is still there.  In September she had a  brief episode of feeling dizzy off balance and into the left which also did not last long.  She recently had a follow-up CT angiogram of the brain and neck done on 10/15/2019 with Dr. Estanislado Pandy which showed patent right MCA stent but there appears to be diminutive flow distal to the stent and Dr. Estanislado Pandy plans to do a diagnostic cerebral catheter angiogram which is scheduled for tomorrow.  Patient is still on both aspirin and Brilinta and I advised her to discuss with Dr. Estanislado Pandy tomorrow if she can needs both and can come off one of them.  She remains on Lipitor which is tolerating well without side effects.  She states she did have lipid profile checked few weeks ago by primary physician but I do not have access to those labs in satisfactory.  Her blood pressure had been elevated a little bit recently and she saw her cardiologist but today it seems to be doing fine at 132/72.  She has anxiety about the procedure tomorrow but does have a prescription for of BuSpar and advised her to take it prior to the angiogram.  Initial Consult 02/07/2019 Dr. Leonie Man : Shannon [name] is a 74 year old Caucasian lady seen today for initial office consultation visit following a stroke.  She is accompanied by her husband.  History is obtained from them and review of electronic medical records.  I personally reviewed imaging films in PACS.  Shannon [name] has a past medical history of hypertension and silent left basal ganglia infarct who was initially seen on 12/08/2018 for transient right face weakness which resolved.  At that time she was found to have high-grade right middle cerebral artery stenosis as well as acute right basal ganglia infarct.  She was also found to have moderate left supraclinoid ICA narrowing and remote age left basal ganglia and small right frontal cortical infarcts.  She was discharged on aspirin and Plavix but returned on 12/24/2018 as a code stroke for evaluation for right gaze deviation and left hemiplegia.  Her  blood pressure was found to be 64 systolic and after giving IV fluids it improved to 638 systolic.  She rapidly improved and return back to her baseline.  MRI showed right basal ganglia infarct and severe right M1 stenosis and moderate atherosclerotic changes.  Diagnostic cerebral catheter angiogram performed by Dr. Estanislado Pandy on 12/25/2018 showed severe high-grade stenosis of the right middle cerebral artery in the distal M1 segment with 50% stenosis of the left internal carotid artery supraclinoid segment, left middle cerebral artery M1 segment and to a lesser degree left anterior cerebral artery in the A1 segment.  There was also 50% stenosis of dominant right vertebral artery at its origin.  The patient was discharged home with plans for elective angioplasty and stenting in 2 weeks.  She will return for this and underwent right M1 angioplasty and stenting.  Postprocedure she had left facial droop and transient worsening.  MRI scan of the brain was obtained which showed slight extension of the basal ganglia infarct.  Patient was eventually discharged home and has done well.  She remains on aspirin and Brilinta which is tolerating well.  She states the facial weakness has resolved.  She still has some heaviness in in the left upper extremity and diminished fine motor skills.  She feels her stamina is not the same and she gets tired easily.  She has been compliant with all her medications but feels her blood pressure has been quite fluctuating.  She is keeping a daily blood pressure log but her primary care physician plans to check ambulatory blood pressure monitor.  She is tolerating Lipitor well without muscle aches and pains.  He has no problems with gait and balance.  She has a lot of anxiety from her condition and has not been returning to her baseline despite physically doing well.     ROS:   14 system review of systems is positive for dizziness and all other systems negative  PMH:  Past Medical  History:  Diagnosis Date  . Anxiety   . 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 Arthritis   . Cancer (HCC)    skin - squamous cell  . Complication of anesthesia    " I had difficulty coming out of it"  . Dyspnea    with exertion  . Family history of adverse reaction to anesthesia    " My daughter has a problem coming ou to it"  . GERD (gastroesophageal reflux disease)   . Headache    migraines in the past  . Hypertension   . Hypothyroidism   . Stroke ([place]) 11/2018   some weakness on right side   . Thyroid disease     Social History:  Social History   Socioeconomic History  . Marital status: Married    Spouse name: Not on file  . Number of children: Not on file  . Years of education: Not on file  . Highest education level: Not on file  Occupational History  . Occupation: retired  Tobacco Use  . Smoking status: Never Smoker  . Smokeless tobacco: Never Used  Vaping Use  . Vaping Use: Never used  Substance and Sexual Activity  . Alcohol use: Never  . Drug use: Never  . Sexual activity: Yes  Other Topics Concern  . Not on file  Social History Narrative  . Not on file   Social Determinants of Health   Financial Resource Strain:   . Difficulty of Paying Living Expenses: Not on file  Food Insecurity:   . Worried About Charity fundraiser in the Last Year: Not on file  . Ran Out of Food in the Last Year: Not on file  Transportation Needs:   . Lack of Transportation (Medical): Not on file  . Lack of Transportation (Non-Medical): Not on file  Physical Activity:   . Days of Exercise per Week: Not on file  . Minutes of Exercise per Session: Not on file  Stress:   . Feeling of Stress : Not on file  Social Connections:   . Frequency of Communication with Friends and Family: Not on file  . Frequency of Social Gatherings with Friends and Family: Not on file  . Attends Religious Services: Not on file  . Active Member of Clubs or Organizations: Not on file  . Attends  Archivist Meetings: Not on file  . Marital Status: Not on file  Intimate Partner Violence:   . Fear of Current or Ex-Partner: Not on file  . Emotionally Abused: Not on file  . Physically Abused: Not on file  . Sexually Abused: Not on file    Medications:   Current Outpatient Medications  on File Prior to Visit  Medication Sig Dispense Refill  . acetaminophen (TYLENOL) 325 MG tablet Take 650 mg by mouth every 6 (six) hours as needed for headache.    Marland Kitchen amLODipine (NORVASC) 2.5 MG tablet TAKE 1 TABLET(2.5 MG) BY MOUTH DAILY 30 tablet 11  . aspirin EC 81 MG EC tablet Take 1 tablet (81 mg total) by mouth daily. 30 tablet 0  . atorvastatin (LIPITOR) 40 MG tablet Take 1 tablet (40 mg total) by mouth daily at 6 PM. 30 tablet 0  . busPIRone (BUSPAR) 5 MG tablet Take 5 mg by mouth daily as needed (anxiety).     . Calcium Carb-Cholecalciferol (CALCIUM 600 + D PO) Take 0.5-1 tablets by mouth See admin instructions. Take 1 tablet after breakfast, 1 tablet after lunch, and 0.5 tablet after dinner    . Carboxymethylcellulose Sodium (ARTIFICIAL TEARS OP) Place 1 drop into both eyes daily as needed (dry eyes).    . Coenzyme Q10 50 MG CAPS Take 50 mg by mouth daily.     Marland Kitchen levothyroxine (SYNTHROID) 75 MCG tablet Take 75 mcg by mouth every Tuesday, Thursday, Saturday, and Sunday.     . levothyroxine (SYNTHROID, LEVOTHROID) 50 MCG tablet Take 50 mcg by mouth every Monday, Wednesday, and Friday.     . loratadine (CLARITIN) 10 MG tablet Take 10 mg by mouth daily.    . Multiple Vitamin (MULTIVITAMIN) capsule Take 1 capsule by mouth daily. Senior    . ticagrelor (BRILINTA) 60 MG TABS tablet Take 60 mg by mouth 2 (two) times daily. Take 1/2 tablet by mouth twice daily    . vitamin E 400 UNIT capsule Take 400 Units by mouth every other day.      No current facility-administered medications on file prior to visit.    Allergies:   Allergies  Allergen Reactions  . Elastic Bandages & [Zinc] Itching  .  Sulfa Antibiotics Other (See Comments)    Tongue turned black  . Mandol [Cefamandole] Rash  . Sinutab Non-Drowsy Max [Pseudoephedrine-Acetaminophen] Palpitations     Vitals Today's Vitals   10/30/20 1243  BP: (!) 144/63  Pulse: 82  Weight: 117 lb (53.1 kg)  Height: 5\' 4"  (1.626 m)   Body mass index is 20.08 kg/m.   Physical Exam General: Frail elderly Caucasian lady, pleasant elderly Caucasian female, seated, in no evident distress Head: head normocephalic and atraumatic.   Neck: supple with no carotid or supraclavicular bruits Cardiovascular: regular rate and rhythm, no murmurs Musculoskeletal: no deformity Skin:  no rash/petichiae Vascular:  Normal pulses all extremities  Neurologic Exam Mental Status: Awake and fully alert.  Fluent speech and language.  Oriented to place and time. Recent and remote memory intact. Attention span, concentration and fund of knowledge appropriate. Mood and affect appropriate.  Cranial Nerves: Pupils equal, briskly reactive to light. Extraocular movements full without nystagmus. Visual fields full to confrontation. Hearing intact. Facial sensation intact.  Mild left lower facial asymmetry., tongue, palate moves normally and symmetrically.  Motor: Normal bulk and tone. Normal strength in all tested extremity muscles Sensory.: intact to touch , pinprick , position and vibratory sensation.  Coordination: Rapid alternating movements normal in all extremities. Finger-to-nose and heel-to-shin performed accurately bilaterally.   Gait and Station: Arises from chair without difficulty. Stance is normal. Gait demonstrates normal stride length and balance . Able to heel, toe and tandem walk with mild  difficulty.  Reflexes: 1+ and symmetric. Toes downgoing.       ASSESSMENT/PLAN: 74 year old Caucasian  lady with right basal ganglia stroke and symptomatic right middle cerebral artery stenosis in January 2020 status post elective angioplasty and stenting  with good clinical results.  Residual deficits of mild left lower facial weakness and left hip flexor weakness.  On 11/21/2019, repeat angioplasty and stenting of severe preocclusive IntraStent stenosis of right MCA M1 segment by Dr. Estanislado Pandy without complication.  Vascular risk factors of hypertension, hyperlipidemia and intracranial atherosclerosis.       1. R BG stroke : Continue aspirin 81 mg daily and Brilinta  and atorvastatin for secondary stroke prevention.  Discussed secondary stroke prevention measures and importance of close PCP/cardiology f/u for aggressive stroke risk factor management  2. R MCA stenosis: s/p stent and multiple angioplasties.  Experiences intermittent dizziness episodes.  Scheduled diagnostic cerebral arteriogram 12/6 with Dr. Estanislado Pandy for restenosis of stent.  Remains on aspirin and Brilinta per VIR recommendations with Brilinta duration per VIR 3. HTN: BP goal <130/90. Stable today. Continue f/u with PCP.  Advised to check blood pressure during dizziness episode has low blood pressure could be contributing to symptoms in setting of stenosis 4. HLD: LDL goal <70. Recent LDL 47.  On atorvastatin per PCP    Follow-up in 6 months or call earlier if needed   I spent 30 minutes of face-to-face and non-face-to-face time with patient.  This included previsit chart review, lab review, study review, order entry, electronic health record documentation, patient education regarding history of stroke, repeat procedure with NIR, occasional dizziness episodes, importance of managing stroke risk factors and answered all questions to patient satisfaction    Frann Rider, AGNP-BC  [facility] [address] [place], [place] [address]  Phone [phone] Fax [phone] Note: This document was prepared with digital dictation and possible smart phrase technology. Any transcriptional errors that result from this process are unintentional.

## 2020-11-03 NOTE — Progress Notes (Signed)
I agree with the above plan 

## 2020-11-13 ENCOUNTER — Telehealth (INDEPENDENT_AMBULATORY_CARE_PROVIDER_SITE_OTHER): Payer: Medicare Other | Admitting: Cardiology

## 2020-11-13 VITALS — BP 140/77 | HR 79 | Ht 65.0 in | Wt 117.0 lb

## 2020-11-13 DIAGNOSIS — Z8673 Personal history of transient ischemic attack (TIA), and cerebral infarction without residual deficits: Secondary | ICD-10-CM

## 2020-11-13 DIAGNOSIS — I471 Supraventricular tachycardia: Secondary | ICD-10-CM | POA: Diagnosis not present

## 2020-11-13 DIAGNOSIS — R0989 Other specified symptoms and signs involving the circulatory and respiratory systems: Secondary | ICD-10-CM | POA: Diagnosis not present

## 2020-11-13 DIAGNOSIS — Z712 Person consulting for explanation of examination or test findings: Secondary | ICD-10-CM

## 2020-11-13 DIAGNOSIS — R002 Palpitations: Secondary | ICD-10-CM

## 2020-11-13 NOTE — Patient Instructions (Addendum)
Medication Instructions:  Your Physician recommend you continue on your current medication as directed.    *If you need a refill on your cardiac medications before your next appointment, please call your pharmacy*   Lab Work: None  Testing/Procedures: None   Follow-Up: At [facility], you and your health needs are our priority.  As part of our continuing mission to provide you with exceptional heart care, we have created designated Provider Care Teams.  These Care Teams include your primary Cardiologist (physician) and Advanced Practice Providers (APPs -  Physician Assistants and Nurse Practitioners) who all work together to provide you with the care you need, when you need it.  We recommend signing up for the patient portal called "MyChart".  Sign up information is provided on this After Visit Summary.  MyChart is used to connect with patients for Virtual Visits (Telemedicine).  Patients are able to view lab/test results, encounter notes, upcoming appointments, etc.  Non-urgent messages can be sent to your provider as well.   To learn more about what you can do with MyChart, go to [email or web address].    Your next appointment:   6-8  week(s)  The format for your next appointment:   Virtual Visit   Provider:   Buford Dresser, MD  Look up vagal maneuvers for SVT.  When you feel off, try using a pulse oximeter to measure your pulse. If your pulse is fast (like 150 bpm), it's probably SVT. If it's near normal, it is probably not the rhythm.   Supraventricular Tachycardia, Adult Supraventricular tachycardia (SVT) is a type of abnormal heart rhythm. It causes the heart to beat very quickly and then return to a normal speed. A normal resting heart rate is 60-100 beats per minute. During an episode of SVT, your heart rate may be higher than 150 beats per minute. Episodes of SVT can be frightening, but they are usually not dangerous. However, if episodes happen several times  per day or last longer than a few seconds, they may lead to heart failure. What are the causes?  Usually, a normal heartbeat starts when an area called the sinoatrial node releases an electrical signal. In SVT, other areas of the heart send out electrical signals that interfere with the signal from the sinoatrial node. It is not known why some people get SVT and others do not. What increases the risk? You are more likely to develop this condition if you are:  23-72 years old.  A woman. The following factors may make you more likely to develop this condition:  Stress.  Tiredness.  Smoking.  Stimulant drugs, such as cocaine and methamphetamine.  Alcohol.  Caffeine.  Pregnancy.  Anxiety. What are the signs or symptoms? Symptoms of this condition include:  A pounding heart.  A feeling that the heart is skipping beats (palpitations).  Weakness.  Shortness of breath.  Tightness or pain in your chest.  Light-headedness.  Anxiety.  Dizziness.  Sweating.  Nausea.  Fainting.  Fatigue or tiredness. A mild episode may not cause symptoms. How is this diagnosed? This condition may be diagnosed based on:  Your symptoms.  A physical exam. ? If you have an episode of SVT during the exam, the health care provider may be able to diagnose SVT by listening to your heart and feeling your pulse.  Tests. These may include: ? An electrocardiogram (ECG). This test is done to check for problems with electrical activity in the heart. ? A Holter monitor or event monitor test. This  test involves wearing a portable device that monitors your heart rate over time. ? An echocardiogram. This test involves taking an image of your heart using sound waves. It is done to rule out other causes of a fast heart rate. ? Blood tests. How is this treated? This condition may be treated with:  Vagal nerve stimulation. The treatment involves stimulating your vagus nerve, which slows down the  heart. It is often the first and only treatment that is needed for this condition. Work with your health care provider to find which one works best for you. Ways to do this treatment include: ? Holding your breath and pushing, as though you are having a bowel movement. ? Massaging an area on one side of your neck, below your jaw. Do not try this yourself. Only a health care provider should do this. If done the wrong way, it can lead to a stroke. ? Bending forward with your head between your legs. ? Coughing while bending forward with your head between your legs. ? Closing your eyes and massaging your eyeballs. A health care provider should guide you through this method before you try it on your own.  Medicines that prevent attacks.  Medicine to stop an attack. The medicine is given through an IV at the hospital.  A small electric shock (cardioversion) that stops an attack. Before you get the shock, you will get medicine to make you fall asleep.  Radiofrequency ablation. In this procedure, a small, thin tube (catheter) is used to send radiofrequency energy to the area of tissue that is causing the rapid heartbeats. The energy kills the cells and helps your heart keep a normal rhythm. You may have this treatment if you have symptoms of SVT often. If you do not have symptoms, you may not need treatment. Follow these instructions at home: Stress  Avoid stressful situations when possible.  Find healthy ways of managing stress that work for you. Some healthy ways to manage stress include: ? Taking part in relaxing activities, such as yoga, meditation, or being out in nature. ? Listening to relaxing music. ? Practicing relaxation techniques, such as deep breathing. ? Leading a healthy lifestyle. This involves getting plenty of sleep, exercising, and eating a balanced diet. ? Attending counseling or talk therapy with a mental health professional. Lifestyle   Try to get at least 7 hours of sleep  each night.  Do not use any products that contain nicotine or tobacco, such as cigarettes, e-cigarettes, and chewing tobacco. If you need help quitting, ask your health care provider.  Be aware of how alcohol affects your condition. If alcohol: ? Triggers episodes of SVT, do not drink alcohol. ? Does not seem to trigger episodes, limit alcohol intake to no more than 1 drink a day for nonpregnant women and 2 drinks a day for men. Be aware of how much alcohol is in your drink. In the [place] one drink equals one 12 oz bottle of beer (355 mL), one 5 oz glass of wine (148 mL), or one 1 oz glass of hard liquor (44 mL).  Be aware of how caffeine affects your condition. If caffeine: ? Triggers episodes of SVT, do not eat, drink, or use anything with caffeine in it. ? Does not seem to trigger episodes, consume caffeine in moderation.  Do not use stimulant drugs. If you need help quitting, talk with your health care provider. General instructions  Maintain a healthy weight.  Exercise regularly. Ask your health care provider to  suggest some good activities for you. Aim for one or a combination of the following: ? 150 minutes per week of moderate exercise, such as walking or yoga. ? 75 minutes per week of vigorous exercise, such as running or swimming.  Perform vagus nerve stimulation as directed by your health care provider.  Take over-the-counter and prescription medicines only as told by your health care provider.  Keep all follow-up visits as told by your health care provider. This is important. Contact a health care provider if:  You have episodes of SVT more often than before.  Episodes of SVT last longer than before.  Vagus nerve stimulation is no longer helping.  You have new symptoms. Get help right away if:  You have chest pain.  Your symptoms get worse.  You have trouble breathing.  You have an episode of SVT that lasts longer than 20 minutes.  You faint. These symptoms  may represent a serious problem that is an emergency. Do not wait to see if the symptoms will go away. Get medical help right away. Call your local emergency services (911 in the [place] Do not drive yourself to the hospital. Summary  Supraventricular tachycardia (SVT) is a type of abnormal heart rhythm.  During an episode of SVT, your heart rate may be higher than 150 beats per minute.  Treatment depends on frequency of occurrence and symptoms experienced. This information is not intended to replace advice given to you by your health care provider. Make sure you discuss any questions you have with your health care provider. Document Revised: 10/17/2018 Document Reviewed: 10/17/2018 [employer]vier Patient Education  2020 [employer].

## 2020-11-13 NOTE — Progress Notes (Signed)
Virtual Visit via Telephone Note   This visit type was conducted due to national recommendations for restrictions regarding the COVID-19 Pandemic (e.g. social distancing) in an effort to limit this patient's exposure and mitigate transmission in our community.  Due to her co-morbid illnesses, this patient is at least at moderate risk for complications without adequate follow up.  This format is felt to be most appropriate for this patient at this time.  The patient did not have access to video technology/had technical difficulties with video requiring transitioning to audio format only (telephone).  All issues noted in this document were discussed and addressed.  No physical exam could be performed with this format.  Please refer to the patient's chart for her  consent to telehealth for [facility].   The patient was identified using 2 identifiers.  Patient Location: Home Provider Location: Home Office  Date:  11/13/2020   ID:  Shannon [name], DOB [birth year], MRN [id number]  PCP:  Donalynn Furlong, MD  Cardiologist:  Buford Dresser, MD  Referring MD: Donalynn Furlong, MD   Cc: follow up  History of Present Illness:    Shannon [name] is a 74 y.o. female with a hx of hypertension, CVA who is seen for follow up today. She was seen in the ER on 09/30/19 for concern for stroke, elevated blood pressure. 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  Today: Reviewed monitor results today.   Has two stent procedures coming up with Dr. Estanislado Pandy, one next week and one likely about 4 weeks after.   Past Medical History:  Diagnosis Date  . Anxiety   . 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 Arthritis   . Cancer (HCC)    skin - squamous cell  . Complication of anesthesia    " I had difficulty coming out of it"  . Dyspnea    with exertion  . Family history of adverse reaction to anesthesia    " My daughter has  a problem coming ou to it"  . GERD (gastroesophageal reflux disease)   . Headache    migraines in the past  . Hypertension   . Hypothyroidism   . Stroke ([place]) 11/2018   some weakness on right side   . Thyroid disease     Past Surgical History:  Procedure Laterality Date  . ABDOMINAL HYSTERECTOMY    . APPENDECTOMY    . CARDIAC CATHETERIZATION    . CARPAL TUNNEL RELEASE Bilateral   . COLONOSCOPY    . DILATION AND CURETTAGE OF UTERUS    . IR ANGIO INTRA EXTRACRAN SEL COM CAROTID INNOMINATE BILAT MOD SED  12/25/2018  . IR ANGIO INTRA EXTRACRAN SEL COM CAROTID INNOMINATE BILAT MOD SED  10/30/2019  . IR ANGIO INTRA EXTRACRAN SEL COM CAROTID INNOMINATE BILAT MOD SED  09/23/2020  . IR ANGIO VERTEBRAL SEL SUBCLAVIAN INNOMINATE UNI L MOD SED  12/25/2018  . IR ANGIO VERTEBRAL SEL SUBCLAVIAN INNOMINATE UNI L MOD SED  10/30/2019  . IR ANGIO VERTEBRAL SEL SUBCLAVIAN INNOMINATE UNI L MOD SED  09/23/2020  . IR ANGIO VERTEBRAL SEL SUBCLAVIAN INNOMINATE UNI R MOD SED  01/04/2019  . IR ANGIO VERTEBRAL SEL VERTEBRAL UNI R MOD SED  12/25/2018  . IR ANGIO VERTEBRAL SEL VERTEBRAL UNI R MOD SED  10/30/2019  . IR ANGIO VERTEBRAL SEL VERTEBRAL UNI R MOD SED  09/23/2020  . IR CT HEAD LTD  01/04/2019  . IR CT HEAD  LTD  11/21/2019  . IR INTRA CRAN STENT  01/04/2019  . IR INTRA CRAN STENT  11/21/2019  . IR US GUIDE VASC ACCESS RIGHT  12/25/2018  . IR US GUIDE VASC ACCESS RIGHT  11/02/2019  . OVARIAN CYST REMOVAL    . RADIOLOGY WITH ANESTHESIA N/A 01/04/2019   Procedure: RMCA angioplasty with possible stenting;  Surgeon: Luanne Bras, MD;  Location: [place];  Service: Radiology;  Laterality: N/A;  . RADIOLOGY WITH ANESTHESIA N/A 11/21/2019   Procedure: IR WITH ANESTHESIA/ STENT PLACEMENT;  Surgeon: Luanne Bras, MD;  Location: [place];  Service: Radiology;  Laterality: N/A;  . RADIOLOGY WITH ANESTHESIA N/A 10/01/2020   Procedure: IR WITH ANESTHESIA ANGIOPLASTY WITH POSSIBLE STENTING;  Surgeon: Luanne Bras, MD;  Location: [place];  Service: Radiology;  Laterality: N/A;    Current Medications: Current Outpatient Medications on File Prior to Visit  Medication Sig  . acetaminophen (TYLENOL) 325 MG tablet Take 325 mg by mouth every 6 (six) hours as needed for headache.   Marland Kitchen amLODipine (NORVASC) 2.5 MG tablet Take 2.5 mg by mouth daily.  Marland Kitchen aspirin EC 81 MG EC tablet Take 1 tablet (81 mg total) by mouth daily.  Marland Kitchen atorvastatin (LIPITOR) 40 MG tablet Take 1 tablet (40 mg total) by mouth daily at 6 PM.  . busPIRone (BUSPAR) 5 MG tablet Take 5 mg by mouth daily as needed (anxiety).   . Calcium Carb-Cholecalciferol (CALCIUM 600 + D PO) Take 0.5-1 tablets by mouth See admin instructions. Take 1 tablet after breakfast, 1 tablet after lunch, and 0.5 tablet after dinner  . Carboxymethylcellulose Sodium (ARTIFICIAL TEARS OP) Place 1 drop into both eyes 3 (three) times daily as needed (dry eyes).   . Coenzyme Q10 50 MG CAPS Take 50 mg by mouth daily.   Marland Kitchen levothyroxine (SYNTHROID) 75 MCG tablet Take 75 mcg by mouth every Tuesday, Thursday, Saturday, and Sunday.   . levothyroxine (SYNTHROID, LEVOTHROID) 50 MCG tablet Take 50 mcg by mouth every Monday, Wednesday, and Friday.   . loratadine (CLARITIN) 10 MG tablet Take 10 mg by mouth daily.  . Multiple Vitamin (MULTIVITAMIN) capsule Take 1 capsule by mouth daily. Senior  . ticagrelor (BRILINTA) 60 MG TABS tablet Take 30 mg by mouth 2 (two) times daily.   . vitamin E 400 UNIT capsule Take 400 Units by mouth every other day.    No current facility-administered medications on file prior to visit.     Allergies:   Elastic bandages & [zinc], Sulfa antibiotics, Tape, Mandol [cefamandole], and Sinutab non-drowsy max [pseudoephedrine-acetaminophen]   Social History   Tobacco Use  . Smoking status: Never Smoker  . Smokeless tobacco: Never Used  Vaping Use  . Vaping Use: Never used  Substance Use Topics  . Alcohol use: Never  . Drug use: Never    Family  History: family history includes CVA in her maternal aunt and mother; Heart disease in her mother; Peripheral Artery Disease in her father.  ROS:   Please see the history of present illness.  Additional pertinent ROS otherwise unremarkable.  EKGs/Labs/Other Studies Reviewed:    The following studies were reviewed today: Monitor 07/31/20 7 days of data recorded on Zio monitor. Patient had a min HR of 50 bpm, max HR of 158 bpm, and avg HR of 85 bpm. Predominant underlying rhythm was Sinus Rhythm. No VT, atrial fibrillation, high degree block, or pauses noted. Isolated atrial and ventricular ectopy was rare (<1%). 524 Supraventricular Tachycardia runs occurred, the run with  the fastest interval lasting 6 beats with a max rate of 158 bpm, the longest lasting 19 beats with an avg rate of 104 bpm.   Echo 12/09/2018 - Left ventricle: The cavity size was normal. Systolic function was   normal. The estimated ejection fraction was in the range of 60%   to 65%. Wall motion was normal; there were no regional wall   motion abnormalities. Left ventricular diastolic function   parameters were normal. - Left atrium: The atrium was normal in size. - Right ventricle: Systolic function was normal. - Atrial septum: No defect or patent foramen ovale was identified.   Echo contrast study showed no right-to-left atrial level shunt,   at baseline or with provocation. - Pulmonary arteries: Systolic pressure was within the normal   range.  EKG:  EKG is personally reviewed.  The ekg ordered 04/09/20 demonstrates NSR with sinus arrhythmia.  Recent Labs: 10/01/2020: BUN 14; Creatinine, Ser 0.73; Hemoglobin 13.5; Platelets 181; Potassium 3.9; Sodium 141  Recent Lipid Panel    Component Value Date/Time   CHOL 199 12/09/2018 0352   TRIG 110 12/09/2018 0352   HDL 45 12/09/2018 0352   CHOLHDL 4.4 12/09/2018 0352   VLDL 22 12/09/2018 0352   LDLCALC 132 (H) 12/09/2018 0352    Physical Exam:    VS:  BP 140/77    Pulse 79   Ht 5\' 5"  (1.651 m)   Wt 117 lb (53.1 kg)   BMI 19.47 kg/m     Wt Readings from Last 3 Encounters:  11/13/20 117 lb (53.1 kg)  10/30/20 117 lb (53.1 kg)  10/01/20 116 lb (52.6 kg)    Speaking comfortably on the phone, no audible wheezing In no acute distress Alert and oriented Normal affect Normal speech  ASSESSMENT:    1. Palpitation   2. Paroxysmal SVT (supraventricular tachycardia) ([place])   3. Encounter to discuss test results   4. Labile hypertension   5. History of CVA (cerebrovascular accident)    PLAN:    Palpitations, tachycardia: -we reviewed monitor results today. This shows paroxysmal SVT. Discussed options for management, preventative (CCB/BB) vs treatment (PRN meds, vagal maneuvers) -consider changing to diltiazem at follow up  Hypertension: remains labile -fewer lows since stopping lisinopril -continue amlodipine 2.5 mg daily. If plan at follow up is to use diltiazem for PSVT, will need to stop/change amlodipine -instructed on red flag warning signs that need immediate medical attention  History of CVA, with need for secondary prevention: -on aspirin, ticagrelor. Ticagrelor duration per interventional neurology -on high intensity atorvastatin -echo 12/09/18 was normal, negative bubble study  Cardiac risk counseling and prevention recommendations: -recommend heart healthy/Mediterranean diet, with whole grains, fruits, vegetable, fish, lean meats, nuts, and olive oil. Limit salt. -recommend moderate walking, 3-5 times/week for 30-50 minutes each session. Aim for at least 150 minutes.week. Goal should be pace of 3 miles/hours, or walking 1.5 miles in 30 minutes -recommend avoidance of tobacco products. Avoid excess alcohol.  Plan for follow up: 6-8 weeks  Today, I have spent 25 minutes with the patient with telehealth technology discussing the above problems.  Additional time spent in chart review, documentation, and communication.  Medication  Adjustments/Labs and Tests Ordered: Current medicines are reviewed at length with the patient today.  Concerns regarding medicines are outlined above.  No orders of the defined types were placed in this encounter.  No orders of the defined types were placed in this encounter.   Patient Instructions  Medication Instructions:  Your Physician  recommend you continue on your current medication as directed.    *If you need a refill on your cardiac medications before your next appointment, please call your pharmacy*   Lab Work: None  Testing/Procedures: None   Follow-Up: At [facility], you and your health needs are our priority.  As part of our continuing mission to provide you with exceptional heart care, we have created designated Provider Care Teams.  These Care Teams include your primary Cardiologist (physician) and Advanced Practice Providers (APPs -  Physician Assistants and Nurse Practitioners) who all work together to provide you with the care you need, when you need it.  We recommend signing up for the patient portal called "MyChart".  Sign up information is provided on this After Visit Summary.  MyChart is used to connect with patients for Virtual Visits (Telemedicine).  Patients are able to view lab/test results, encounter notes, upcoming appointments, etc.  Non-urgent messages can be sent to your provider as well.   To learn more about what you can do with MyChart, go to [email or web address].    Your next appointment:   6-8  week(s)  The format for your next appointment:   Virtual Visit   Provider:   Buford Dresser, MD  Look up vagal maneuvers for SVT.  When you feel off, try using a pulse oximeter to measure your pulse. If your pulse is fast (like 150 bpm), it's probably SVT. If it's near normal, it is probably not the rhythm.   Supraventricular Tachycardia, Adult Supraventricular tachycardia (SVT) is a type of abnormal heart rhythm. It causes the heart  to beat very quickly and then return to a normal speed. A normal resting heart rate is 60-100 beats per minute. During an episode of SVT, your heart rate may be higher than 150 beats per minute. Episodes of SVT can be frightening, but they are usually not dangerous. However, if episodes happen several times per day or last longer than a few seconds, they may lead to heart failure. What are the causes?  Usually, a normal heartbeat starts when an area called the sinoatrial node releases an electrical signal. In SVT, other areas of the heart send out electrical signals that interfere with the signal from the sinoatrial node. It is not known why some people get SVT and others do not. What increases the risk? You are more likely to develop this condition if you are:  35-89 years old.  A woman. The following factors may make you more likely to develop this condition:  Stress.  Tiredness.  Smoking.  Stimulant drugs, such as cocaine and methamphetamine.  Alcohol.  Caffeine.  Pregnancy.  Anxiety. What are the signs or symptoms? Symptoms of this condition include:  A pounding heart.  A feeling that the heart is skipping beats (palpitations).  Weakness.  Shortness of breath.  Tightness or pain in your chest.  Light-headedness.  Anxiety.  Dizziness.  Sweating.  Nausea.  Fainting.  Fatigue or tiredness. A mild episode may not cause symptoms. How is this diagnosed? This condition may be diagnosed based on:  Your symptoms.  A physical exam. ? If you have an episode of SVT during the exam, the health care provider may be able to diagnose SVT by listening to your heart and feeling your pulse.  Tests. These may include: ? An electrocardiogram (ECG). This test is done to check for problems with electrical activity in the heart. ? A Holter monitor or event monitor test. This test involves wearing a portable  device that monitors your heart rate over time. ? An  echocardiogram. This test involves taking an image of your heart using sound waves. It is done to rule out other causes of a fast heart rate. ? Blood tests. How is this treated? This condition may be treated with:  Vagal nerve stimulation. The treatment involves stimulating your vagus nerve, which slows down the heart. It is often the first and only treatment that is needed for this condition. Work with your health care provider to find which one works best for you. Ways to do this treatment include: ? Holding your breath and pushing, as though you are having a bowel movement. ? Massaging an area on one side of your neck, below your jaw. Do not try this yourself. Only a health care provider should do this. If done the wrong way, it can lead to a stroke. ? Bending forward with your head between your legs. ? Coughing while bending forward with your head between your legs. ? Closing your eyes and massaging your eyeballs. A health care provider should guide you through this method before you try it on your own.  Medicines that prevent attacks.  Medicine to stop an attack. The medicine is given through an IV at the hospital.  A small electric shock (cardioversion) that stops an attack. Before you get the shock, you will get medicine to make you fall asleep.  Radiofrequency ablation. In this procedure, a small, thin tube (catheter) is used to send radiofrequency energy to the area of tissue that is causing the rapid heartbeats. The energy kills the cells and helps your heart keep a normal rhythm. You may have this treatment if you have symptoms of SVT often. If you do not have symptoms, you may not need treatment. Follow these instructions at home: Stress  Avoid stressful situations when possible.  Find healthy ways of managing stress that work for you. Some healthy ways to manage stress include: ? Taking part in relaxing activities, such as yoga, meditation, or being out in nature. ? Listening  to relaxing music. ? Practicing relaxation techniques, such as deep breathing. ? Leading a healthy lifestyle. This involves getting plenty of sleep, exercising, and eating a balanced diet. ? Attending counseling or talk therapy with a mental health professional. Lifestyle   Try to get at least 7 hours of sleep each night.  Do not use any products that contain nicotine or tobacco, such as cigarettes, e-cigarettes, and chewing tobacco. If you need help quitting, ask your health care provider.  Be aware of how alcohol affects your condition. If alcohol: ? Triggers episodes of SVT, do not drink alcohol. ? Does not seem to trigger episodes, limit alcohol intake to no more than 1 drink a day for nonpregnant women and 2 drinks a day for men. Be aware of how much alcohol is in your drink. In the [place] one drink equals one 12 oz bottle of beer (355 mL), one 5 oz glass of wine (148 mL), or one 1 oz glass of hard liquor (44 mL).  Be aware of how caffeine affects your condition. If caffeine: ? Triggers episodes of SVT, do not eat, drink, or use anything with caffeine in it. ? Does not seem to trigger episodes, consume caffeine in moderation.  Do not use stimulant drugs. If you need help quitting, talk with your health care provider. General instructions  Maintain a healthy weight.  Exercise regularly. Ask your health care provider to suggest some good activities for  you. Aim for one or a combination of the following: ? 150 minutes per week of moderate exercise, such as walking or yoga. ? 75 minutes per week of vigorous exercise, such as running or swimming.  Perform vagus nerve stimulation as directed by your health care provider.  Take over-the-counter and prescription medicines only as told by your health care provider.  Keep all follow-up visits as told by your health care provider. This is important. Contact a health care provider if:  You have episodes of SVT more often than  before.  Episodes of SVT last longer than before.  Vagus nerve stimulation is no longer helping.  You have new symptoms. Get help right away if:  You have chest pain.  Your symptoms get worse.  You have trouble breathing.  You have an episode of SVT that lasts longer than 20 minutes.  You faint. These symptoms may represent a serious problem that is an emergency. Do not wait to see if the symptoms will go away. Get medical help right away. Call your local emergency services (911 in the [place] Do not drive yourself to the hospital. Summary  Supraventricular tachycardia (SVT) is a type of abnormal heart rhythm.  During an episode of SVT, your heart rate may be higher than 150 beats per minute.  Treatment depends on frequency of occurrence and symptoms experienced. This information is not intended to replace advice given to you by your health care provider. Make sure you discuss any questions you have with your health care provider. Document Revised: 10/17/2018 Document Reviewed: 10/17/2018 Elsevier Patient Education  2020 [employer].    Signed, Buford Dresser, MD PhD 11/13/2020     [place]

## 2020-11-14 ENCOUNTER — Other Ambulatory Visit (HOSPITAL_COMMUNITY)
Admission: AD | Admit: 2020-11-14 | Discharge: 2020-11-14 | Disposition: A | Discharge status: Home / Self Care | Payer: Medicare Other | Source: Ambulatory Visit | Attending: Interventional Radiology | Admitting: Interventional Radiology

## 2020-11-14 ENCOUNTER — Other Ambulatory Visit (HOSPITAL_COMMUNITY): Payer: Self-pay | Admitting: Physician Assistant

## 2020-11-14 ENCOUNTER — Encounter (HOSPITAL_COMMUNITY): Payer: Self-pay | Admitting: Interventional Radiology

## 2020-11-14 ENCOUNTER — Other Ambulatory Visit (HOSPITAL_COMMUNITY)
Admission: RE | Admit: 2020-11-14 | Discharge: 2020-11-14 | Disposition: A | Discharge status: Home / Self Care | Payer: Medicare Other | Source: Ambulatory Visit | Attending: Interventional Radiology | Admitting: Interventional Radiology

## 2020-11-14 DIAGNOSIS — I639 Cerebral infarction, unspecified: Secondary | ICD-10-CM | POA: Insufficient documentation

## 2020-11-14 DIAGNOSIS — Z20822 Contact with and (suspected) exposure to covid-19: Secondary | ICD-10-CM | POA: Insufficient documentation

## 2020-11-14 DIAGNOSIS — Z01812 Encounter for preprocedural laboratory examination: Secondary | ICD-10-CM | POA: Insufficient documentation

## 2020-11-14 LAB — SARS CORONAVIRUS 2 (TAT 6-24 HRS): SARS Coronavirus 2: NEGATIVE

## 2020-11-14 LAB — PLATELET INHIBITION P2Y12: Platelet Function  P2Y12: 31 [PRU] — ABNORMAL LOW (ref 182–335)

## 2020-11-14 NOTE — Progress Notes (Signed)
Spoke with pt for pre-op call. Pt states that she wore a heart monitor a few months ago but never heard anything until this week. She states Shannon [name] has informed her that she had episodes of SVT, mostly at night time. She states Shannon [name] does not plan to add any new medication until after her procedure is done. Pt states that she doesn't really feel anything except a feeling like she can't get her breath. Shannon Caldwell, PA reviewed Shannon [name] note. Pt is not diabetic.  Covid test done today, result pending.  Pt states she's been in quarantine since the test was done and understands that she stays in quarantine until she comes to the hospital on Monday.

## 2020-11-16 NOTE — Anesthesia Preprocedure Evaluation (Signed)
Anesthesia Evaluation  Patient identified by MRN, date of birth, ID band Patient awake    Reviewed: Allergy & Precautions, NPO status , Patient's Chart, lab work & pertinent test results  Airway Mallampati: II  TM Distance: >3 FB Neck ROM: Full    Dental no notable dental hx.    Pulmonary neg pulmonary ROS,    Pulmonary exam normal breath sounds clear to auscultation       Cardiovascular hypertension, Pt. on medications Normal cardiovascular exam Rhythm:Regular Rate:Normal  ECG: SR, rate 77   Neuro/Psych  Headaches, Anxiety TIACVA, Residual Symptoms    GI/Hepatic negative GI ROS, Neg liver ROS,   Endo/Other  Hypothyroidism   Renal/GU negative Renal ROS     Musculoskeletal  (+) Arthritis ,   Abdominal   Peds  Hematology hld   Anesthesia Other Findings stenosis  Reproductive/Obstetrics                            Anesthesia Physical Anesthesia Plan  ASA: III  Anesthesia Plan: MAC   Post-op Pain Management:    Induction: Intravenous  PONV Risk Score and Plan: 2 and Ondansetron, Dexamethasone, Treatment may vary due to age or medical condition and Midazolam  Airway Management Planned: Nasal Cannula  Additional Equipment:   Intra-op Plan:   Post-operative Plan:   Informed Consent: I have reviewed the patients History and Physical, chart, labs and discussed the procedure including the risks, benefits and alternatives for the proposed anesthesia with the patient or authorized representative who has indicated his/her understanding and acceptance.     Dental advisory given  Plan Discussed with: CRNA  Anesthesia Plan Comments:        Anesthesia Quick Evaluation

## 2020-11-17 ENCOUNTER — Inpatient Hospital Stay (HOSPITAL_COMMUNITY)
Admission: RE | Admit: 2020-11-17 | Discharge: 2020-11-18 | DRG: 038 | Disposition: A | Discharge status: Home / Self Care | Payer: Medicare Other | Attending: Interventional Radiology | Admitting: Interventional Radiology

## 2020-11-17 ENCOUNTER — Ambulatory Visit (HOSPITAL_COMMUNITY): Payer: Medicare Other | Admitting: Physician Assistant

## 2020-11-17 ENCOUNTER — Ambulatory Visit (HOSPITAL_COMMUNITY)
Admission: RE | Admit: 2020-11-17 | Discharge: 2020-11-17 | Disposition: A | Discharge status: Home / Self Care | Payer: Medicare Other | Source: Ambulatory Visit | Attending: Interventional Radiology | Admitting: Interventional Radiology

## 2020-11-17 ENCOUNTER — Encounter (HOSPITAL_COMMUNITY)
Admission: RE | Disposition: A | Discharge status: Home / Self Care | Payer: Self-pay | Source: Home / Self Care | Attending: Interventional Radiology

## 2020-11-17 ENCOUNTER — Other Ambulatory Visit: Payer: Self-pay

## 2020-11-17 ENCOUNTER — Encounter (HOSPITAL_COMMUNITY): Payer: Self-pay | Admitting: Interventional Radiology

## 2020-11-17 DIAGNOSIS — Z7902 Long term (current) use of antithrombotics/antiplatelets: Secondary | ICD-10-CM | POA: Diagnosis not present

## 2020-11-17 DIAGNOSIS — I771 Stricture of artery: Secondary | ICD-10-CM

## 2020-11-17 DIAGNOSIS — Z9109 Other allergy status, other than to drugs and biological substances: Secondary | ICD-10-CM | POA: Diagnosis not present

## 2020-11-17 DIAGNOSIS — Z823 Family history of stroke: Secondary | ICD-10-CM

## 2020-11-17 DIAGNOSIS — I10 Essential (primary) hypertension: Secondary | ICD-10-CM | POA: Diagnosis not present

## 2020-11-17 DIAGNOSIS — Z90710 Acquired absence of both cervix and uterus: Secondary | ICD-10-CM

## 2020-11-17 DIAGNOSIS — I6621 Occlusion and stenosis of right posterior cerebral artery: Secondary | ICD-10-CM | POA: Diagnosis not present

## 2020-11-17 DIAGNOSIS — I471 Supraventricular tachycardia: Secondary | ICD-10-CM | POA: Diagnosis present

## 2020-11-17 DIAGNOSIS — Z7982 Long term (current) use of aspirin: Secondary | ICD-10-CM | POA: Diagnosis not present

## 2020-11-17 DIAGNOSIS — G450 Vertebro-basilar artery syndrome: Secondary | ICD-10-CM | POA: Diagnosis present

## 2020-11-17 DIAGNOSIS — Z20822 Contact with and (suspected) exposure to covid-19: Secondary | ICD-10-CM | POA: Diagnosis present

## 2020-11-17 DIAGNOSIS — Z79890 Hormone replacement therapy (postmenopausal): Secondary | ICD-10-CM

## 2020-11-17 DIAGNOSIS — M1990 Unspecified osteoarthritis, unspecified site: Secondary | ICD-10-CM | POA: Diagnosis present

## 2020-11-17 DIAGNOSIS — Z888 Allergy status to other drugs, medicaments and biological substances status: Secondary | ICD-10-CM

## 2020-11-17 DIAGNOSIS — Z8249 Family history of ischemic heart disease and other diseases of the circulatory system: Secondary | ICD-10-CM | POA: Diagnosis not present

## 2020-11-17 DIAGNOSIS — Z79899 Other long term (current) drug therapy: Secondary | ICD-10-CM | POA: Diagnosis not present

## 2020-11-17 DIAGNOSIS — K219 Gastro-esophageal reflux disease without esophagitis: Secondary | ICD-10-CM | POA: Diagnosis present

## 2020-11-17 DIAGNOSIS — Z8673 Personal history of transient ischemic attack (TIA), and cerebral infarction without residual deficits: Secondary | ICD-10-CM

## 2020-11-17 DIAGNOSIS — Z882 Allergy status to sulfonamides status: Secondary | ICD-10-CM | POA: Diagnosis not present

## 2020-11-17 DIAGNOSIS — E039 Hypothyroidism, unspecified: Secondary | ICD-10-CM | POA: Diagnosis not present

## 2020-11-17 DIAGNOSIS — F419 Anxiety disorder, unspecified: Secondary | ICD-10-CM | POA: Diagnosis present

## 2020-11-17 HISTORY — PX: IR TRANSCATH EXCRAN VERT OR CAR A STENT: IMG1955

## 2020-11-17 HISTORY — DX: Supraventricular tachycardia, unspecified: I47.10

## 2020-11-17 HISTORY — DX: Supraventricular tachycardia: I47.1

## 2020-11-17 HISTORY — PX: RADIOLOGY WITH ANESTHESIA: SHX6223

## 2020-11-17 LAB — POCT ACTIVATED CLOTTING TIME
Activated Clotting Time: 232 seconds
Activated Clotting Time: 220 seconds

## 2020-11-17 LAB — BASIC METABOLIC PANEL
Anion gap: 10 (ref 5–15)
BUN: 14 mg/dL (ref 8–23)
CO2: 27 mmol/L (ref 22–32)
Calcium: 9.7 mg/dL (ref 8.9–10.3)
Chloride: 103 mmol/L (ref 98–111)
Creatinine, Ser: 0.76 mg/dL (ref 0.44–1.00)
GFR, Estimated: >60 mL/min (ref >60)
Glucose, Bld: 92 mg/dL (ref 70–99)
Potassium: 4.1 mmol/L (ref 3.5–5.1)
Sodium: 140 mmol/L (ref 135–145)

## 2020-11-17 LAB — CBC WITH DIFFERENTIAL/PLATELET
Abs Immature Granulocytes: 0.01 10*3/uL (ref 0.00–0.07)
Basophils Absolute: 0 10*3/uL (ref 0.0–0.1)
Basophils Relative: 1 %
Eosinophils Absolute: 0.1 10*3/uL (ref 0.0–0.5)
Eosinophils Relative: 3 %
HCT: 40.4 % (ref 36.0–46.0)
Hemoglobin: 13.3 g/dL (ref 12.0–15.0)
Immature Granulocytes: 0 %
Lymphocytes Relative: 36 %
Lymphs Abs: 1.3 10*3/uL (ref 0.7–4.0)
MCH: 31 pg (ref 26.0–34.0)
MCHC: 32.9 g/dL (ref 30.0–36.0)
MCV: 94.2 fL (ref 80.0–100.0)
Monocytes Absolute: 0.4 10*3/uL (ref 0.1–1.0)
Monocytes Relative: 10 %
Neutro Abs: 1.8 10*3/uL (ref 1.7–7.7)
Neutrophils Relative %: 50 %
Platelets: 169 10*3/uL (ref 150–400)
RBC: 4.29 MIL/uL (ref 3.87–5.11)
RDW: 11.5 % (ref 11.5–15.5)
WBC: 3.7 10*3/uL — ABNORMAL LOW (ref 4.0–10.5)
nRBC: 0 % (ref 0.0–0.2)

## 2020-11-17 LAB — URINALYSIS, COMPLETE (UACMP) WITH MICROSCOPIC
Bacteria, UA: NONE SEEN
Bilirubin Urine: NEGATIVE
Glucose, UA: NEGATIVE mg/dL
Hgb urine dipstick: NEGATIVE
Ketones, ur: NEGATIVE mg/dL
Nitrite: NEGATIVE
Protein, ur: NEGATIVE mg/dL
Specific Gravity, Urine: 1.013 (ref 1.005–1.030)
pH: 7 (ref 5.0–8.0)

## 2020-11-17 LAB — PROTIME-INR
INR: 1 (ref 0.8–1.2)
Prothrombin Time: 13.2 seconds (ref 11.4–15.2)

## 2020-11-17 LAB — MRSA PCR SCREENING: MRSA by PCR: NEGATIVE

## 2020-11-17 LAB — HEPARIN LEVEL (UNFRACTIONATED): Heparin Unfractionated: 0.15 IU/mL — ABNORMAL LOW (ref 0.30–0.70)

## 2020-11-17 LAB — PLATELET INHIBITION P2Y12: Platelet Function  P2Y12: 7 [PRU] — ABNORMAL LOW (ref 182–335)

## 2020-11-17 SURGERY — IR WITH ANESTHESIA
Anesthesia: Monitor Anesthesia Care

## 2020-11-17 MED ORDER — PROTAMINE SULFATE 10 MG/ML IV SOLN
INTRAVENOUS | Status: DC | PRN
  Administered 2020-11-17: 5 mg via INTRAVENOUS

## 2020-11-17 MED ORDER — ACETAMINOPHEN 10 MG/ML IV SOLN: 1000.0000 mg | Freq: Once | INTRAVENOUS | Status: DC | PRN

## 2020-11-17 MED ORDER — ASPIRIN EC 325 MG PO TBEC: 325.0000 mg | DELAYED_RELEASE_TABLET | ORAL | Status: DC

## 2020-11-17 MED ORDER — FENTANYL CITRATE (PF) 100 MCG/2ML IJ SOLN: 25.0000 ug | INTRAMUSCULAR | Status: DC | PRN

## 2020-11-17 MED ORDER — CLEVIDIPINE BUTYRATE 0.5 MG/ML IV EMUL
INTRAVENOUS | Status: DC | PRN
  Administered 2020-11-17: 2 mg/h via INTRAVENOUS

## 2020-11-17 MED ORDER — CLEVIDIPINE BUTYRATE 0.5 MG/ML IV EMUL
INTRAVENOUS | Status: AC
  Filled 2020-11-17: qty 50

## 2020-11-17 MED ORDER — CEFAZOLIN SODIUM-DEXTROSE 2-4 GM/100ML-% IV SOLN
INTRAVENOUS | Status: AC
  Filled 2020-11-17: qty 100

## 2020-11-17 MED ORDER — VANCOMYCIN HCL 1000 MG IV SOLR: 1000.0000 mg | INTRAVENOUS | Status: DC

## 2020-11-17 MED ORDER — CLEVIDIPINE BUTYRATE 0.5 MG/ML IV EMUL
0.0000 mg/h | INTRAVENOUS | Status: DC
  Administered 2020-11-17: 4 mg/h via INTRAVENOUS

## 2020-11-17 MED ORDER — AMLODIPINE BESYLATE 2.5 MG PO TABS: 2.5000 mg | ORAL_TABLET | Freq: Every day | ORAL | Status: DC

## 2020-11-17 MED ORDER — HEPARIN SODIUM (PORCINE) 1000 UNIT/ML IJ SOLN
INTRAMUSCULAR | Status: DC | PRN
  Administered 2020-11-17: 3000 [IU] via INTRAVENOUS

## 2020-11-17 MED ORDER — ASPIRIN 81 MG PO CHEW
81.0000 mg | CHEWABLE_TABLET | Freq: Every day | ORAL | Status: DC
  Administered 2020-11-18: 81 mg via ORAL
  Filled 2020-11-17: qty 1

## 2020-11-17 MED ORDER — CHLORHEXIDINE GLUCONATE 0.12 % MT SOLN
15.0000 mL | Freq: Once | OROMUCOSAL | Status: AC
  Administered 2020-11-17: 15 mL via OROMUCOSAL
  Filled 2020-11-17: qty 15

## 2020-11-17 MED ORDER — CEFAZOLIN SODIUM-DEXTROSE 2-3 GM-%(50ML) IV SOLR
INTRAVENOUS | Status: DC | PRN
  Administered 2020-11-17: 2 g via INTRAVENOUS

## 2020-11-17 MED ORDER — CLOPIDOGREL BISULFATE 75 MG PO TABS: 75.0000 mg | ORAL_TABLET | ORAL | Status: DC

## 2020-11-17 MED ORDER — ATORVASTATIN CALCIUM 40 MG PO TABS
40.0000 mg | ORAL_TABLET | Freq: Every day | ORAL | Status: DC
  Administered 2020-11-17: 40 mg via ORAL
  Filled 2020-11-17: qty 1

## 2020-11-17 MED ORDER — VANCOMYCIN HCL IN DEXTROSE 1-5 GM/200ML-% IV SOLN
1000.0000 mg | Freq: Once | INTRAVENOUS | Status: DC
  Filled 2020-11-17: qty 200

## 2020-11-17 MED ORDER — FENTANYL CITRATE (PF) 250 MCG/5ML IJ SOLN
INTRAMUSCULAR | Status: AC
  Filled 2020-11-17: qty 5

## 2020-11-17 MED ORDER — SODIUM CHLORIDE 0.9 % IV SOLN: INTRAVENOUS | Status: DC

## 2020-11-17 MED ORDER — HEPARIN (PORCINE) 25000 UT/250ML-% IV SOLN
500.0000 [IU]/h | INTRAVENOUS | Status: DC
  Administered 2020-11-17: 500 [IU]/h via INTRAVENOUS
  Filled 2020-11-17: qty 250

## 2020-11-17 MED ORDER — NIMODIPINE 30 MG PO CAPS
30.0000 mg | ORAL_CAPSULE | ORAL | Status: AC
  Administered 2020-11-17: 30 mg via ORAL

## 2020-11-17 MED ORDER — LIDOCAINE HCL (PF) 1 % IJ SOLN
INTRAMUSCULAR | Status: AC | PRN
  Administered 2020-11-17: 10 mL

## 2020-11-17 MED ORDER — IOHEXOL 300 MG/ML  SOLN
150.0000 mL | Freq: Once | INTRAMUSCULAR | Status: AC | PRN
  Administered 2020-11-17: 78 mL via INTRA_ARTERIAL

## 2020-11-17 MED ORDER — ORAL CARE MOUTH RINSE: 15.0000 mL | Freq: Once | OROMUCOSAL | Status: AC

## 2020-11-17 MED ORDER — MIDAZOLAM HCL 5 MG/5ML IJ SOLN
INTRAMUSCULAR | Status: DC | PRN
  Administered 2020-11-17: 1 mg via INTRAVENOUS

## 2020-11-17 MED ORDER — NITROGLYCERIN 1 MG/10 ML FOR IR/CATH LAB
INTRA_ARTERIAL | Status: AC
  Filled 2020-11-17: qty 10

## 2020-11-17 MED ORDER — ONDANSETRON HCL 4 MG/2ML IJ SOLN
INTRAMUSCULAR | Status: DC | PRN
  Administered 2020-11-17: 4 mg via INTRAVENOUS

## 2020-11-17 MED ORDER — FENTANYL CITRATE (PF) 100 MCG/2ML IJ SOLN
INTRAMUSCULAR | Status: AC
  Filled 2020-11-17: qty 2

## 2020-11-17 MED ORDER — TICAGRELOR 90 MG PO TABS: 90.0000 mg | ORAL_TABLET | Freq: Two times a day (BID) | ORAL | Status: DC

## 2020-11-17 MED ORDER — CLEVIDIPINE BUTYRATE 0.5 MG/ML IV EMUL
0.0000 mg/h | INTRAVENOUS | Status: DC
  Administered 2020-11-17: 20 mg/h via INTRAVENOUS
  Administered 2020-11-17: 4 mg/h via INTRAVENOUS
  Administered 2020-11-18: 3 mg/h via INTRAVENOUS
  Filled 2020-11-17 (×3): qty 50

## 2020-11-17 MED ORDER — FENTANYL CITRATE (PF) 100 MCG/2ML IJ SOLN
INTRAMUSCULAR | Status: DC | PRN
  Administered 2020-11-17 (×3): 25 ug via INTRAVENOUS

## 2020-11-17 MED ORDER — LEVOTHYROXINE SODIUM 75 MCG PO TABS
75.0000 ug | ORAL_TABLET | ORAL | Status: DC
  Administered 2020-11-18: 75 ug via ORAL
  Filled 2020-11-17: qty 1

## 2020-11-17 MED ORDER — ONDANSETRON HCL 4 MG/2ML IJ SOLN: 4.0000 mg | Freq: Once | INTRAMUSCULAR | Status: DC | PRN

## 2020-11-17 MED ORDER — LORATADINE 10 MG PO TABS: 10.0000 mg | ORAL_TABLET | Freq: Every day | ORAL | Status: DC

## 2020-11-17 MED ORDER — MIDAZOLAM HCL 2 MG/2ML IJ SOLN
INTRAMUSCULAR | Status: AC
  Filled 2020-11-17: qty 2

## 2020-11-17 MED ORDER — LACTATED RINGERS IV SOLN: INTRAVENOUS | Status: DC

## 2020-11-17 MED ORDER — LACTATED RINGERS IV SOLN: INTRAVENOUS | Status: DC | PRN

## 2020-11-17 MED ORDER — LIDOCAINE HCL 1 % IJ SOLN
INTRAMUSCULAR | Status: AC
  Filled 2020-11-17: qty 20

## 2020-11-17 MED ORDER — CHLORHEXIDINE GLUCONATE CLOTH 2 % EX PADS
6.0000 | MEDICATED_PAD | Freq: Every day | CUTANEOUS | Status: DC
  Administered 2020-11-17: 6 via TOPICAL

## 2020-11-17 MED ORDER — TICAGRELOR 60 MG PO TABS
30.0000 mg | ORAL_TABLET | Freq: Two times a day (BID) | ORAL | Status: DC
  Administered 2020-11-17 – 2020-11-18 (×2): 30 mg via ORAL
  Filled 2020-11-17 (×3): qty 1

## 2020-11-17 MED ORDER — POLYVINYL ALCOHOL 1.4 % OP SOLN
1.0000 [drp] | Freq: Three times a day (TID) | OPHTHALMIC | Status: DC | PRN
  Administered 2020-11-18: 1 [drp] via OPHTHALMIC
  Filled 2020-11-17 (×2): qty 15

## 2020-11-17 MED ORDER — BUSPIRONE HCL 5 MG PO TABS
5.0000 mg | ORAL_TABLET | Freq: Every day | ORAL | Status: DC | PRN
  Filled 2020-11-17: qty 1

## 2020-11-17 MED ORDER — NIMODIPINE 30 MG PO CAPS
0.0000 mg | ORAL_CAPSULE | ORAL | Status: DC
  Filled 2020-11-17: qty 1

## 2020-11-17 MED ORDER — ASPIRIN 81 MG PO CHEW: 81.0000 mg | CHEWABLE_TABLET | Freq: Every day | ORAL | Status: DC

## 2020-11-17 NOTE — H&P (Signed)
Chief Complaint: Patient was seen in consultation today for left VA stenosis/revascularization.  Referring Physician(s): Rosalin Hawking (neurology)  Supervising Physician: Luanne Bras  Patient Status: [facility] - Out-pt  History of Present Illness: Shannon [name] is a 74 y.o. 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 She is known to [facility] and has been followed by Dr. Estanislado Pandy since 12/2018. She was first referred to our department by Dr. 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 She underwent an image-guided cerebral arteriogram with revascularization of right MCA M1 stenosis using stent assisted angioplasty 01/04/2019 by Dr. Estanislado Pandy. Following, stenosis recurred, and patient underwent an image-guided cerebral arteriogram with staged revascularization of right MCA M1 intra-stent stenosis using stent assisted angioplasty 11/21/2019. In addition, has known left VA stenosis. Follow-up diagnostic cerebral arteriogram 09/23/2020 revealed continued left VA stenosis along with right MCA M1 intra-stent stenosis.  Patient presents today for possible image-guided cerebral arteriogram with possible revascularization of left VA stenosis. Patient awake and alert sitting in bed. Complains of intermittent dizziness. Denies fever, chills, chest pain, dyspnea, abdominal pain, or headache.  Currently taking Brilinta 30 mg twice daily and Aspirin 81 mg once daily.   Past Medical History:  Diagnosis Date  . Anxiety   . 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 Arthritis   . Cancer (HCC)    skin - squamous cell  . Complication of anesthesia    " I had difficulty coming out of it"  . Dyspnea    with exertion  . Family history of adverse reaction to anesthesia    " My daughter has a problem coming ou to it"  . GERD (gastroesophageal reflux disease)   . Headache    migraines in the past  . Hypertension     . Hypothyroidism   . Stroke ([place]) 11/2018   some weakness on right side   . SVT (supraventricular tachycardia) ([place])   . Thyroid disease     Past Surgical History:  Procedure Laterality Date  . ABDOMINAL HYSTERECTOMY    . APPENDECTOMY    . CARDIAC CATHETERIZATION    . CARPAL TUNNEL RELEASE Bilateral   . COLONOSCOPY    . DILATION AND CURETTAGE OF UTERUS    . IR ANGIO INTRA EXTRACRAN SEL COM CAROTID INNOMINATE BILAT MOD SED  12/25/2018  . IR ANGIO INTRA EXTRACRAN SEL COM CAROTID INNOMINATE BILAT MOD SED  10/30/2019  . IR ANGIO INTRA EXTRACRAN SEL COM CAROTID INNOMINATE BILAT MOD SED  09/23/2020  . IR ANGIO VERTEBRAL SEL SUBCLAVIAN INNOMINATE UNI L MOD SED  12/25/2018  . IR ANGIO VERTEBRAL SEL SUBCLAVIAN INNOMINATE UNI L MOD SED  10/30/2019  . IR ANGIO VERTEBRAL SEL SUBCLAVIAN INNOMINATE UNI L MOD SED  09/23/2020  . IR ANGIO VERTEBRAL SEL SUBCLAVIAN INNOMINATE UNI R MOD SED  01/04/2019  . IR ANGIO VERTEBRAL SEL VERTEBRAL UNI R MOD SED  12/25/2018  . IR ANGIO VERTEBRAL SEL VERTEBRAL UNI R MOD SED  10/30/2019  . IR ANGIO VERTEBRAL SEL VERTEBRAL UNI R MOD SED  09/23/2020  . IR CT HEAD LTD  01/04/2019  . IR CT HEAD LTD  11/21/2019  . IR INTRA CRAN STENT  01/04/2019  . IR INTRA CRAN STENT  11/21/2019  . IR US GUIDE VASC ACCESS RIGHT  12/25/2018  . IR US GUIDE VASC ACCESS RIGHT  11/02/2019  . OVARIAN CYST REMOVAL    . RADIOLOGY WITH ANESTHESIA  N/A 01/04/2019   Procedure: RMCA angioplasty with possible stenting;  Surgeon: Luanne Bras, MD;  Location: [place];  Service: Radiology;  Laterality: N/A;  . RADIOLOGY WITH ANESTHESIA N/A 11/21/2019   Procedure: IR WITH ANESTHESIA/ STENT PLACEMENT;  Surgeon: Luanne Bras, MD;  Location: [place];  Service: Radiology;  Laterality: N/A;  . RADIOLOGY WITH ANESTHESIA N/A 10/01/2020   Procedure: IR WITH ANESTHESIA ANGIOPLASTY WITH POSSIBLE STENTING;  Surgeon: Luanne Bras, MD;  Location: [place];  Service: Radiology;  Laterality: N/A;     Allergies: Elastic bandages & [zinc], Sulfa antibiotics, Tape, Mandol [cefamandole], and Sinutab non-drowsy max [pseudoephedrine-acetaminophen]  Medications: Prior to Admission medications   Medication Sig Start Date End Date Taking? Authorizing Provider  acetaminophen (TYLENOL) 325 MG tablet Take 325 mg by mouth every 6 (six) hours as needed for headache.    Yes [provider]  amLODipine (NORVASC) 2.5 MG tablet Take 2.5 mg by mouth daily.   Yes [provider]  aspirin EC 81 MG EC tablet Take 1 tablet (81 mg total) by mouth daily. 12/10/18  Yes Mayo, Pete Pelt, MD  atorvastatin (LIPITOR) 40 MG tablet Take 1 tablet (40 mg total) by mouth daily at 6 PM. 12/09/18  Yes Mayo, Pete Pelt, MD  busPIRone (BUSPAR) 5 MG tablet Take 5 mg by mouth daily as needed (anxiety).  10/12/19  Yes [provider]  Calcium Carb-Cholecalciferol (CALCIUM 600 + D PO) Take 0.5-1 tablets by mouth See admin instructions. Take 1 tablet after breakfast, 1 tablet after lunch, and 0.5 tablet after dinner   Yes [provider]  Carboxymethylcellulose Sodium (ARTIFICIAL TEARS OP) Place 1 drop into both eyes 3 (three) times daily as needed (dry eyes).    Yes [provider]  Coenzyme Q10 50 MG CAPS Take 50 mg by mouth daily.    Yes [provider]  levothyroxine (SYNTHROID) 75 MCG tablet Take 75 mcg by mouth every Tuesday, Thursday, Saturday, and Sunday.    Yes [provider]  levothyroxine (SYNTHROID, LEVOTHROID) 50 MCG tablet Take 50 mcg by mouth every Monday, Wednesday, and Friday.  04/24/12  Yes [provider]  loratadine (CLARITIN) 10 MG tablet Take 10 mg by mouth daily.   Yes [provider]  Multiple Vitamin (MULTIVITAMIN) capsule Take 1 capsule by mouth daily. Senior 04/24/12  Yes [provider]  ticagrelor (BRILINTA) 60 MG TABS tablet Take 30 mg by mouth 2 (two) times daily.    Yes [provider]  vitamin E 400 UNIT  capsule Take 400 Units by mouth every other day.    Yes [provider]     Family History  Problem Relation Age of Onset  . CVA Mother   . Heart disease Mother   . Peripheral Artery Disease Father   . CVA Maternal Aunt     Social History   Socioeconomic History  . Marital status: Married    Spouse name: Not on file  . Number of children: Not on file  . Years of education: Not on file  . Highest education level: Not on file  Occupational History  . Occupation: retired  Tobacco Use  . Smoking status: Never Smoker  . Smokeless tobacco: Never Used  Vaping Use  . Vaping Use: Never used  Substance and Sexual Activity  . Alcohol use: Never  . Drug use: Never  . Sexual activity: Yes  Other Topics Concern  . Not on file  Social History Narrative  . Not on file  Social Determinants of Health   Financial Resource Strain:   . Difficulty of Paying Living Expenses: Not on file  Food Insecurity:   . Worried About Charity fundraiser in the Last Year: Not on file  . Ran Out of Food in the Last Year: Not on file  Transportation Needs:   . Lack of Transportation (Medical): Not on file  . Lack of Transportation (Non-Medical): Not on file  Physical Activity:   . Days of Exercise per Week: Not on file  . Minutes of Exercise per Session: Not on file  Stress:   . Feeling of Stress : Not on file  Social Connections:   . Frequency of Communication with Friends and Family: Not on file  . Frequency of Social Gatherings with Friends and Family: Not on file  . Attends Religious Services: Not on file  . Active Member of Clubs or Organizations: Not on file  . Attends Archivist Meetings: Not on file  . Marital Status: Not on file     Review of Systems: A 12 point ROS discussed and pertinent positives are indicated in the HPI above.  All other systems are negative.  Review of Systems  Constitutional: Negative for chills and fever.  Respiratory: Negative for  shortness of breath and wheezing.   Cardiovascular: Negative for chest pain and palpitations.  Gastrointestinal: Negative for abdominal pain.  Neurological: Positive for dizziness. Negative for headaches.  Psychiatric/Behavioral: Negative for behavioral problems and confusion.    Vital Signs: BP (!) 169/60   Pulse 80   Temp 97.8 F (36.6 C) (Oral)   Resp 18   Ht 5\' 5"  (1.651 m)   Wt 117 lb (53.1 kg)   SpO2 100%   BMI 19.47 kg/m   Physical Exam Vitals and nursing note reviewed.  Constitutional:      General: She is not in acute distress.    Appearance: Normal appearance.  Cardiovascular:     Rate and Rhythm: Normal rate and regular rhythm.     Heart sounds: Normal heart sounds. No murmur heard.   Pulmonary:     Effort: Pulmonary effort is normal. No respiratory distress.     Breath sounds: Normal breath sounds. No wheezing.  Skin:    General: Skin is warm and dry.  Neurological:     Mental Status: She is alert and oriented to person, place, and time.      MD Evaluation Airway: WNL Heart: WNL Abdomen: WNL Chest/ Lungs: WNL ASA  Classification: 3 Mallampati/Airway Score: Two   Imaging: No results found.  Labs:  CBC: Recent Labs    11/22/19 0500 11/22/19 0500 09/23/20 0805 09/23/20 0939 10/01/20 0626 11/17/20 0714  WBC 6.2  --  4.0  --  3.3* 3.7*  HGB 11.1*   < > 13.8 11.9* 13.5 13.3  HCT 32.5*   < > 40.9 35.0* 39.8 40.4  PLT 173  --  210  --  181 169   < > = values in this interval not displayed.    COAGS: Recent Labs    11/21/19 0635 11/21/19 1400 09/23/20 0805 10/01/20 0626 11/17/20 0714  INR 0.9  --  1.0 1.0 1.0  APTT 30 >200* 31 32  --     BMP: Recent Labs    11/21/19 0635 11/21/19 0635 11/22/19 0500 04/01/20 0858 09/23/20 0805 09/23/20 0939 10/01/20 0626 11/17/20 0714  NA 141   < > 137  --  137 144 141 140  K 4.0   < >  3.0*  --  6.4* 3.8 3.9 4.1  CL 105   < > 108  --  105 108 105 103  CO2 25   < > 21*  --  25  --  28 27   GLUCOSE 89   < > 89  --  87 95 90 92  BUN 15   < > 6*  --  14 13 14 14   CALCIUM 9.9   < > 8.0*  --  9.5  --  9.7 9.7  CREATININE 0.74   < > 0.59   < > 0.75 0.50 0.73 0.76  GFRNONAA >60   < > >60  --  >60  --  >60 >60  GFRAA >60  --  >60  --   --   --   --   --    < > = values in this interval not displayed.     Assessment and Plan:  Left VA stenosis. Plan for image-guided cerebral arteriogram with possible revascularization (angioplasty, stent placement) of left VA stenosis. Patient is NPO. Afebrile. Ok to proceed with Brilinta/Aspirin at this time. INR 1.0 today. P2Y12 7 PRU today- ok to proceed per Dr. Estanislado Pandy. COPVID negative 11/14/2020.  Risks and benefits of cerebral arteriogram with intervention were discussed with the patient including, but not limited to bleeding, infection, vascular injury, contrast induced renal failure, stroke, reperfusion hemorrhage, or even death. This interventional procedure involves the use of X-rays and because of the nature of the planned procedure, it is possible that we will have prolonged use of X-ray fluoroscopy. Potential radiation risks to you include (but are not limited to) the following: - A slightly elevated risk for cancer  several years later in life. This risk is typically less than 0.5% percent. This risk is low in comparison to the normal incidence of human cancer, which is 33% for women and 50% for men according to the [place]. - Radiation induced injury can include skin redness, resembling a rash, tissue breakdown / ulcers and hair loss (which can be temporary or permanent).  The likelihood of either of these occurring depends on the difficulty of the procedure and whether you are sensitive to radiation due to previous procedures, disease, or genetic conditions.  IF your procedure requires a prolonged use of radiation, you will be notified and given written instructions for further action.  It is your responsibility to  monitor the irradiated area for the 2 weeks following the procedure and to notify your physician if you are concerned that you have suffered a radiation induced injury.   All of the patient's questions were answered, patient is agreeable to proceed. Consent signed and in chart.   Thank you for this interesting consult.  I greatly enjoyed meeting Shannon [name] and look forward to participating in their care.  A copy of this report was sent to the requesting provider on this date.  Electronically Signed: Earley Abide, PA-C 11/17/2020, 8:33 AM   I spent a total of 40 Minutes in face to face in clinical consultation, greater than 50% of which was counseling/coordinating care for left VA stenosis/revascularization.

## 2020-11-17 NOTE — Anesthesia Procedure Notes (Signed)
Arterial Line Insertion Start/End12/05/2020 8:55 AM Performed by: Candis Shine, CRNA, CRNA  Patient location: Pre-op. Preanesthetic checklist: patient identified, IV checked, site marked, risks and benefits discussed, surgical consent, monitors and equipment checked, pre-op evaluation, timeout performed and anesthesia consent Lidocaine 1% used for infiltration Left, radial was placed Catheter size: 20 G Hand hygiene performed  and maximum sterile barriers used   Attempts: 2 Procedure performed without using ultrasound guided technique. Following insertion, dressing applied and Biopatch. Post procedure assessment: normal and unchanged  Patient tolerated the procedure well with no immediate complications.

## 2020-11-17 NOTE — Sedation Documentation (Signed)
Handoff with PACU, RN.  Viewed groin site and checked pulses. Passed on from Dr. Keturah Barre that he does not want patient to have vitals or any sticks on left arm for 24 hours.

## 2020-11-17 NOTE — Progress Notes (Signed)
ANTICOAGULATION CONSULT NOTE - Initial Consult  Pharmacy Consult:  Heparin Indication:  Post Interventional Neuroradiology Procedure  Allergies  Allergen Reactions  . Elastic Bandages & [Zinc] Itching  . Sulfa Antibiotics Other (See Comments)    Tongue turned black  . Tape     Pulled blood through the skin- happened during a procedure in Oct 2021   . Mandol [Cefamandole] Rash  . Sinutab Non-Drowsy Max [Pseudoephedrine-Acetaminophen] Palpitations    Patient Measurements: Height: 5\' 5"  (165.1 cm) Weight: 53.1 kg (117 lb) IBW/kg (Calculated) : 57 Heparin Dosing Weight: 53 kg  Vital Signs: Temp: 97.9 F (36.6 C) (12/06 1300) Temp Source: Oral (12/06 0639) BP: 127/65 (12/06 1300) Pulse Rate: 82 (12/06 1315)  Labs: Recent Labs    11/17/20 0714  HGB 13.3  HCT 40.4  PLT 169  LABPROT 13.2  INR 1.0  CREATININE 0.76    Estimated Creatinine Clearance: 51.7 mL/min (by C-G formula based on SCr of 0.76 mg/dL).   Medical History: Past Medical History:  Diagnosis Date  . Anxiety   . 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 Arthritis   . Cancer (HCC)    skin - squamous cell  . Complication of anesthesia    " I had difficulty coming out of it"  . Dyspnea    with exertion  . Family history of adverse reaction to anesthesia    " My daughter has a problem coming ou to it"  . GERD (gastroesophageal reflux disease)   . Headache    migraines in the past  . Hypertension   . Hypothyroidism   . Stroke ([place]) 11/2018   some weakness on right side   . SVT (supraventricular tachycardia) ([place])   . Thyroid disease      Assessment: 71 YOF s/p neuro stenting to begin IV heparin.  Baseline labs and home meds reviewed.  Goal of Therapy:  Heparin level 0.1-0.25 units/ml Monitor platelets by anticoagulation protocol: Yes   Plan:  Start heparin gtt at 500 units/hr - stop in AM per protocol Check 8 hr heparin level  Carroll Ranney D. Mina Marble, PharmD, BCPS, [place] 11/17/2020, 2:18 PM

## 2020-11-17 NOTE — Progress Notes (Signed)
ANTICOAGULATION CONSULT NOTE  Pharmacy Consult:  Heparin Indication:  Post Interventional Neuroradiology Procedure  Allergies  Allergen Reactions  . Elastic Bandages & [Zinc] Itching  . Sulfa Antibiotics Other (See Comments)    Tongue turned black  . Tape     Pulled blood through the skin- happened during a procedure in Oct 2021   . Mandol [Cefamandole] Rash  . Sinutab Non-Drowsy Max [Pseudoephedrine-Acetaminophen] Palpitations    Patient Measurements: Height: 5\' 5"  (165.1 cm) Weight: 53.1 kg (117 lb) IBW/kg (Calculated) : 57 Heparin Dosing Weight: 53 kg  Vital Signs: Temp: 99.5 F (37.5 C) (12/06 2000) Temp Source: Oral (12/06 2000) BP: 131/69 (12/06 2300) Pulse Rate: 84 (12/06 2345)  Labs: Recent Labs    11/17/20 0714 11/17/20 2315  HGB 13.3  --   HCT 40.4  --   PLT 169  --   LABPROT 13.2  --   INR 1.0  --   HEPARINUNFRC  --  0.15*  CREATININE 0.76  --     Estimated Creatinine Clearance: 51.7 mL/min (by C-G formula based on SCr of 0.76 mg/dL).   Medical History: Past Medical History:  Diagnosis Date  . Anxiety   . 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 Arthritis   . Cancer (HCC)    skin - squamous cell  . Complication of anesthesia    " I had difficulty coming out of it"  . Dyspnea    with exertion  . Family history of adverse reaction to anesthesia    " My daughter has a problem coming ou to it"  . GERD (gastroesophageal reflux disease)   . Headache    migraines in the past  . Hypertension   . Hypothyroidism   . Stroke ([place]) 11/2018   some weakness on right side   . SVT (supraventricular tachycardia) ([place])   . Thyroid disease      Assessment: 49 YOF s/p neuro stenting to begin IV heparin.  Baseline labs and home meds reviewed.  12/6 PM update: Heparin level at goal tonight  Goal of Therapy:  Heparin level 0.1-0.25 units/ml Monitor platelets by anticoagulation protocol: Yes   Plan:  Cont heparin at 500 units/hr Heparin off 12/7  0800  Narda Bonds, PharmD, BCPS Clinical Pharmacist Phone: [phone]

## 2020-11-17 NOTE — Sedation Documentation (Signed)
Right groin site closed with a 7 fr exoseal at 1106. Pressure was held at site until 1115. Site is level 0 and pulses palpable.

## 2020-11-17 NOTE — Anesthesia Postprocedure Evaluation (Signed)
Anesthesia Post Note  Patient: Shannon [name]  Procedure(s) Performed: IR WITH ANESTHESIA STENT PLACEMENT (N/A )     Patient location during evaluation: PACU Anesthesia Type: MAC Level of consciousness: awake Pain management: pain level controlled Vital Signs Assessment: post-procedure vital signs reviewed and stable Respiratory status: spontaneous breathing, nonlabored ventilation, respiratory function stable and patient connected to nasal cannula oxygen Cardiovascular status: stable and blood pressure returned to baseline Postop Assessment: no apparent nausea or vomiting Anesthetic complications: no   No complications documented.  Last Vitals:  Vitals:   11/17/20 1915 11/17/20 1930  BP:    Pulse: 90 82  Resp: (!) 21 18  Temp:    SpO2: 97% 97%    Last Pain:  Vitals:   11/17/20 1600  TempSrc: Oral  PainSc: 0-No pain                 Darika Ildefonso P Triniti Gruetzmacher

## 2020-11-17 NOTE — Anesthesia Procedure Notes (Signed)
Procedure Name: MAC Date/Time: 11/17/2020 9:30 AM Performed by: Candis Shine, CRNA Pre-anesthesia Checklist: Patient identified, Suction available, Patient being monitored and Emergency Drugs available Patient Re-evaluated:Patient Re-evaluated prior to induction Oxygen Delivery Method: Nasal cannula Dental Injury: Teeth and Oropharynx as per pre-operative assessment

## 2020-11-17 NOTE — Transfer of Care (Signed)
Immediate Anesthesia Transfer of Care Note  Patient: Shannon [name]  Procedure(s) Performed: IR WITH ANESTHESIA STENT PLACEMENT (N/A )  Patient Location: PACU  Anesthesia Type:MAC  Level of Consciousness: awake, alert  and oriented  Airway & Oxygen Therapy: Patient Spontanous Breathing  Post-op Assessment: Report given to RN and Post -op Vital signs reviewed and stable  Post vital signs: Reviewed and stable. Cleviprex gtt restarted.  Last Vitals:  Vitals Value Taken Time  BP 117/75 11/17/20 1132  Temp    Pulse 81 11/17/20 1138  Resp 12 11/17/20 1138  SpO2 100 % 11/17/20 1138  Vitals shown include unvalidated device data.  Last Pain:  Vitals:   11/17/20 0728  TempSrc:   PainSc: 0-No pain      Patients Stated Pain Goal: 4 (05/39/76 7341)  Complications: No complications documented.

## 2020-11-17 NOTE — Progress Notes (Signed)
Paged Dr. Estanislado Pandy. Patient requested sleep aid. MD hesitant to give sleep aid at this time. This RN also wanted to clarify blood pressure goals. Cleviprex order says SBP 110. MD stated SBP goal is 120 to 140. Will continue to monitor.

## 2020-11-17 NOTE — Procedures (Signed)
S/P Lt Vert angiogram with stent assisted angioplasty of severe stenosis of Lt VA origin. Post procedure patient awake ,alert Ox 3  Denies any H/S,N/V speech or visual or motor symptoms. Neurologically grossly;y unchanged. Hemostasis at the  RT groin puncture with a 21F exoseal. Distal pulses all palpable. No needle sticks or BP cuff in the Lt arm for 24 hrs. Arlean Hopping MD

## 2020-11-17 NOTE — Progress Notes (Signed)
NIR.  Patient underwent an image-guided cerebral arteriogram with revascularization of left VA origin using stent assisted angioplasty via right femoral approach this AM by Dr. Estanislado Pandy.  Went to evaluate patient bedside alongside Dr. Estanislado Pandy. Patient awake and alert laying in bed with no complaints at this time. Denies dizziness, headache, confusion, vision changes. Husband at bedside.  Alert, awake, and oriented x3. Speech and comprehension intact. PERRL bilaterally. EOMs intact bilaterally without nystagmus or subjective diplopia. No facial asymmetry. Tongue midline. Can spontaneously move all extremities. No pronator drift. Distal pulses (DPs) palpable bilaterally with Doppler. Right femoral puncture site soft without active bleeding or hematoma.  Plan to stay in neuro ICU for overnight observation. Advance diet as tolerated. No needle sticks/BP to left arm for 24 hours. Continue taking Brilinta 30 mg twice daily and Aspirin 81 mg once daily. Plan for possible discharge tomorrow if stable. NIR to follow.   Bea Graff Jasira Robinson, PA-C 11/17/2020, 3:18 PM

## 2020-11-18 ENCOUNTER — Encounter (HOSPITAL_COMMUNITY): Payer: Self-pay | Admitting: Interventional Radiology

## 2020-11-18 LAB — BASIC METABOLIC PANEL
Anion gap: 10 (ref 5–15)
BUN: 8 mg/dL (ref 8–23)
CO2: 21 mmol/L — ABNORMAL LOW (ref 22–32)
Calcium: 8.9 mg/dL (ref 8.9–10.3)
Chloride: 104 mmol/L (ref 98–111)
Creatinine, Ser: 0.53 mg/dL (ref 0.44–1.00)
GFR, Estimated: >60 mL/min (ref >60)
Glucose, Bld: 103 mg/dL — ABNORMAL HIGH (ref 70–99)
Potassium: 3.7 mmol/L (ref 3.5–5.1)
Sodium: 135 mmol/L (ref 135–145)

## 2020-11-18 LAB — CBC WITH DIFFERENTIAL/PLATELET
Abs Immature Granulocytes: 0.03 10*3/uL (ref 0.00–0.07)
Basophils Absolute: 0 10*3/uL (ref 0.0–0.1)
Basophils Relative: 1 %
Eosinophils Absolute: 0 10*3/uL (ref 0.0–0.5)
Eosinophils Relative: 1 %
HCT: 36.4 % (ref 36.0–46.0)
Hemoglobin: 12.3 g/dL (ref 12.0–15.0)
Immature Granulocytes: 1 %
Lymphocytes Relative: 22 %
Lymphs Abs: 1.4 10*3/uL (ref 0.7–4.0)
MCH: 31.1 pg (ref 26.0–34.0)
MCHC: 33.8 g/dL (ref 30.0–36.0)
MCV: 92.2 fL (ref 80.0–100.0)
Monocytes Absolute: 0.5 10*3/uL (ref 0.1–1.0)
Monocytes Relative: 8 %
Neutro Abs: 4.4 10*3/uL (ref 1.7–7.7)
Neutrophils Relative %: 67 %
Platelets: 184 10*3/uL (ref 150–400)
RBC: 3.95 MIL/uL (ref 3.87–5.11)
RDW: 11.4 % — ABNORMAL LOW (ref 11.5–15.5)
WBC: 6.4 10*3/uL (ref 4.0–10.5)
nRBC: 0 % (ref 0.0–0.2)

## 2020-11-18 MED ORDER — ACETAMINOPHEN 325 MG PO TABS
650.0000 mg | ORAL_TABLET | Freq: Four times a day (QID) | ORAL | Status: DC | PRN
  Administered 2020-11-18: 650 mg via ORAL
  Filled 2020-11-18: qty 2

## 2020-11-18 NOTE — Discharge Instructions (Signed)
Vertebral Angioplasty With Stent, Care After This sheet gives you information about how to care for yourself after your procedure. Your doctor may also give you more specific instructions. If you have problems or questions, contact your doctor. What can I expect after the procedure? After the procedure, it is common to have:  Bruising at the site where the tube (catheter) was inserted. This usually goes away within 1-2 weeks. Follow these instructions at home: Medicines  Take over-the-counter and prescription medicines only as told by your doctor.  Your doctor may prescribe two medicines to thin your blood after the procedure. This will help to prevent blood clots, please take these- Brilinta 30 mg twice daily and Aspirin 81 mg once daily. Incision care  Keep your cut from surgery clean and dry.  Follow instructions from your doctor about how to take care of your cut from surgery. Make sure you: ? Wash your hands with soap and water before and after you change your bandage (dressing). If you cannot use soap and water, use hand sanitizer. ? Ok to remove bandage from right groin 24 hours after discharge. ? Do not rub the site. This may cause bleeding. ? Ok to shower 24 hours after discharge. No submerging (swimming, bathing) for 7 days post-procedure.  Check the area around your cut every day for signs of infection. Check for: ? More redness, swelling, or pain. ? More fluid or blood. ? Warmth. ? Pus or a bad smell. Activity  Do not stoop, bend, or lift anything that is heavier than 10 lb (4.5 kg) until 2 week follow-up.  No driving self until 2 week follow-up. Ok to be a passenger during this time. Eating and drinking  Follow instructions from your doctor about what you cannot eat or drink.  Drink enough fluid to keep your pee (urine) pale yellow.  Eat a heart-healthy diet. This includes foods like fresh fruits and vegetables, whole grains, low-fat dairy products, and low-fat  (lean) meats. Avoid foods that are: ? High in salt, saturated fat, or sugar. ? Canned or highly processed. ? Fried. Lifestyle  If you drink alcohol: ? Limit how much you use to:  0-1 drink a day for women.  0-2 drinks a day for men. ? Be aware of how much alcohol is in your drink. In the [place] one drink equals one 12 oz bottle of beer (355 mL), one 5 oz glass of wine (148 mL), or one 1 oz glass of hard liquor (44 mL).  Do not use any products that contain nicotine or tobacco, such as cigarettes, e-cigarettes, and chewing tobacco. If you need help quitting, ask your doctor.  Work with your doctor to keep your blood pressure under control.  Stay at a healthy weight. General instructions  Tell all doctors who provide care for you that you have a stent.  Keep all follow-up visits as told by your doctor. This is important. 2 week clinic follow-up- our office will call you to schedule this appointment. Contact a doctor if:  You have more redness, swelling, or pain around your cut.  You have more fluid or blood coming from your cut.  The area around your cut feels warm to the touch.  You have pus or a bad smell coming from your cut.  You have a lump caused by bleeding under your skin, and the lump does not go away after 2 weeks.  You have a fever. Get help right away if:  You have a hard time  breathing.  You have pain in your chest.  You have any signs of a stroke. You may be at risk for a stroke even after this procedure. You may be at greater risk of a stroke if you have diabetes, lung disease, kidney disease, had a previous stroke, or are 48 years of age or older. "BE FAST" is an easy way to remember the main warning signs: ? B - Balance. Signs are dizziness, sudden trouble walking, or loss of balance. ? E - Eyes. Signs are trouble seeing or a change in how you see. ? F - Face. Signs are sudden weakness or loss of feeling of the face, or the face or eyelid drooping on one  side. ? A - Arms. Signs are weakness or loss of feeling in an arm. This happens suddenly and usually on one side of the body. ? S - Speech. Signs are sudden trouble speaking, slurred speech, or trouble understanding what people say. ? T - Time. Time to call emergency services. Write down what time symptoms started.  You have other signs of a stroke, such as: ? A sudden, very bad headache with no known cause. ? Feeling sick to your stomach (nausea). ? Vomiting. ? Jerky movements you cannot control (seizure).  You notice a lump caused by bleeding under your skin and the lump is quickly getting larger.  You suddenly get pain in the area where your stent was placed.  Your cut from surgery starts to bleed and does not stop after you hold pressure on it for a few minutes. These symptoms may be an emergency. Do not wait to see if the symptoms will go away. Get medical help right away. Call your local emergency services (911 in the [place] Do not drive yourself to the hospital.  This information is not intended to replace advice given to you by your health care provider. Make sure you discuss any questions you have with your health care provider. Document Revised: 01/08/2019 Document Reviewed: 10/12/2018 Elsevier Patient Education  2020 [employer].

## 2020-11-18 NOTE — Progress Notes (Signed)
Dr. Estanislado Pandy paged 2 times ([phone], [phone]) to request pain medicine for patient's back. Verbal order received for oral tylenol 650 mg every 6 hours as needed.

## 2020-11-18 NOTE — Discharge Summary (Signed)
Patient ID: Shannon [name] MRN: [id number] DOB/AGE: [birth year] 74 y.o.  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 Estanislado [name], secondary to dizziness. Procedure occurred without major complications and patient was transferred to neuro ICU in stable condition (VSS, right femoral puncture site stable) for overnight observation. No major events occurred overnight.  Patient awake and alert sitting in bed with no complaints at this time. Denies headache, weakness, dizziness, vision changes. Husband at bedside. Ate breakfast without issues. Right femoral puncture site stable. Plan to discharge home today and follow-up with Dr. Estanislado [name] in clinic 2 weeks after discharge.   Consults: None  Significant Diagnostic Studies: No results found.  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 rate 14, height 5\' 5"  (1.651 m), weight 117 lb (53.1 kg), SpO2 98 %. Physical Exam Vitals and nursing note reviewed.  Constitutional:      General: She is not in acute distress.    Appearance: Normal appearance.  Cardiovascular:     Rate and Rhythm: Normal rate and regular rhythm.     Heart sounds: Normal heart sounds. No murmur heard.   Pulmonary:     Effort: Pulmonary effort is normal. No respiratory distress.     Breath sounds: Normal breath sounds. No wheezing.  Skin:    General: Skin is  warm and dry.     Comments: Right femoral puncture site soft without active bleeding or hematoma.  Neurological:     Mental Status: She is alert.     Comments: Alert, awake, and oriented x3. Speech and comprehension intact. PERRL bilaterally. No facial asymmetry. Tongue midline. Can spontaneously move all extremities. No pronator drift. Fine motor and coordination intact and symmetric. Distal pulses (DPs) palpable bilaterally with Doppler.     Disposition: Discharge disposition: 01-Home or Self Care       Discharge Instructions    Call MD for:  difficulty breathing, headache or visual disturbances   Complete by: As directed    Call MD for:  extreme fatigue   Complete by: As directed    Call MD for:  hives   Complete by: As directed    Call MD for:  persistant dizziness or light-headedness   Complete by: As directed    Call MD for:  persistant nausea and vomiting   Complete by: As directed    Call MD for:  redness, tenderness, or signs of infection (pain, swelling, redness, odor or green/yellow discharge around incision site)   Complete by: As directed    Call MD for:  severe uncontrolled pain   Complete by: As directed    Call MD for:  temperature >100.4   Complete by: As directed    Diet - low sodium heart healthy   Complete by: As directed    Discharge instructions   Complete by: As directed    Continue taking Brilinta 30 mg twice daily and Aspirin 81 mg once daily. Stay hydrated by drinking plenty of water.   Driving Restrictions   Complete by: As directed  No driving self until 2 week follow-up appointment. Ok to sit in car as passenger during this time.   Increase activity slowly   Complete by: As directed    Lifting restrictions   Complete by: As directed    No stooping, bending, or lifting more than 10 pounds until 2 week follow-up appointment.     Allergies as of 11/18/2020      Reactions   Elastic Bandages & [zinc] Itching   Sulfa Antibiotics Other  (See Comments)   Tongue turned black   Tape    Pulled blood through the skin- happened during a procedure in Oct 2021    Mandol [cefamandole] Rash   Sinutab Non-drowsy Max [pseudoephedrine-acetaminophen] Palpitations      Medication List    TAKE these medications   acetaminophen 325 MG tablet Commonly known as: TYLENOL Take 325 mg by mouth every 6 (six) hours as needed for headache.   amLODipine 2.5 MG tablet Commonly known as: NORVASC Take 2.5 mg by mouth daily.   ARTIFICIAL TEARS OP Place 1 drop into both eyes 3 (three) times daily as needed (dry eyes).   aspirin 81 MG EC tablet Take 1 tablet (81 mg total) by mouth daily.   atorvastatin 40 MG tablet Commonly known as: LIPITOR Take 1 tablet (40 mg total) by mouth daily at 6 PM.   Brilinta 60 MG Tabs tablet Generic drug: ticagrelor Take 30 mg by mouth 2 (two) times daily.   busPIRone 5 MG tablet Commonly known as: BUSPAR Take 5 mg by mouth daily as needed (anxiety).   CALCIUM 600 + D PO Take 0.5-1 tablets by mouth See admin instructions. Take 1 tablet after breakfast, 1 tablet after lunch, and 0.5 tablet after dinner   Claritin 10 MG tablet Generic drug: loratadine Take 10 mg by mouth daily.   Coenzyme Q10 50 MG Caps Take 50 mg by mouth daily.   levothyroxine 75 MCG tablet Commonly known as: SYNTHROID Take 75 mcg by mouth every Tuesday, Thursday, Saturday, and Sunday.   levothyroxine 50 MCG tablet Commonly known as: SYNTHROID Take 50 mcg by mouth every Monday, Wednesday, and Friday.   multivitamin capsule Take 1 capsule by mouth daily. Senior   vitamin E 180 MG (400 UNITS) capsule Take 400 Units by mouth every other day.       Follow-up Information    Shannon Bras, MD Follow up in 2 week(s).   Specialties: Interventional Radiology, Radiology Why: Please follow-up with Dr. Estanislado [name] in clinic 2 weeks after discharge. Our office will call you to set up this appointment. Contact information: [place] [address] [phone]                Electronically Signed: Earley Abide, PA-C 11/18/2020, 10:16 AM   I have spent Greater Than 30 Minutes discharging Shannon [name].

## 2020-12-31 ENCOUNTER — Telehealth: Payer: Self-pay | Admitting: Student

## 2020-12-31 NOTE — Progress Notes (Signed)
Patient ID: Shannon [name], female   DOB: 09/09/1946, 75 y.o.   MRN: [id number]  Received request for non-formulary med review from [facility] [place].   Patient currently taking Brilinta 60 mg 1/2 tablet twice daily.   Form faxed to [phone].  Brynda Greathouse, MS RD PA-C

## 2021-01-14 ENCOUNTER — Ambulatory Visit (INDEPENDENT_AMBULATORY_CARE_PROVIDER_SITE_OTHER): Payer: Medicare Other | Admitting: Cardiology

## 2021-01-14 ENCOUNTER — Encounter: Payer: Self-pay | Admitting: Cardiology

## 2021-01-14 ENCOUNTER — Other Ambulatory Visit: Payer: Self-pay

## 2021-01-14 VITALS — BP 176/77 | HR 84 | Ht 65.0 in | Wt 117.2 lb

## 2021-01-14 DIAGNOSIS — Z7189 Other specified counseling: Secondary | ICD-10-CM | POA: Diagnosis not present

## 2021-01-14 DIAGNOSIS — I10 Essential (primary) hypertension: Secondary | ICD-10-CM

## 2021-01-14 DIAGNOSIS — I471 Supraventricular tachycardia: Secondary | ICD-10-CM | POA: Diagnosis not present

## 2021-01-14 DIAGNOSIS — Z8673 Personal history of transient ischemic attack (TIA), and cerebral infarction without residual deficits: Secondary | ICD-10-CM

## 2021-01-14 MED ORDER — DILTIAZEM HCL ER COATED BEADS 120 MG PO CP24: 120.0000 mg | ORAL_CAPSULE | Freq: Every day | ORAL | 11 refills | Status: DC

## 2021-01-14 NOTE — Patient Instructions (Signed)
Medication Instructions:  Stop Amlodipine 2.5 mg Start Diltiazem 120 mg daily  *If you need a refill on your cardiac medications before your next appointment, please call your pharmacy*   Lab Work: None   Testing/Procedures: None   Follow-Up: At [facility], you and your health needs are our priority.  As part of our continuing mission to provide you with exceptional heart care, we have created designated Provider Care Teams.  These Care Teams include your primary Cardiologist (physician) and Advanced Practice Providers (APPs -  Physician Assistants and Nurse Practitioners) who all work together to provide you with the care you need, when you need it.  We recommend signing up for the patient portal called "MyChart".  Sign up information is provided on this After Visit Summary.  MyChart is used to connect with patients for Virtual Visits (Telemedicine).  Patients are able to view lab/test results, encounter notes, upcoming appointments, etc.  Non-urgent messages can be sent to your provider as well.   To learn more about what you can do with MyChart, go to [email or web address].    Your next appointment:   02/18/21 @ 10:20 am  The format for your next appointment:   In Person  Provider:   Buford Dresser, MD

## 2021-01-14 NOTE — Progress Notes (Signed)
Date:  01/14/2021   ID:  Shannon [name], DOB 1946/02/04, MRN [id number]  PCP:  Storm Frisk, MD  Cardiologist:  Jodelle Red, MD  Referring MD: Storm Frisk, MD   Cc: follow up  History of Present Illness:    Shannon [name] is a 75 y.o. female with a hx of hypertension, CVA who is seen for follow up today. She was seen in the ER on 09/30/19 for concern for stroke, elevated blood pressure. 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  Today: Has had one of two surgeries with Dr. Corliss Skains that she needs. Feels she is doing well overall so far from that perspective.  Reviewed blood pressure logs. Remains labile. Feels bad if her BP is less than 110s in the morning. Range has been 100s/50s to rare 140s/70s. Most 110s/60s. Heart rate largely 80s. Currently taking amlodipine 2.5 mg daily as her only medication.   We reviewed her monitor results. Has brief intermittent episodes of racing heart beats, hot flashes, sweating. She also notes that her blood pressure goes up with stress/anxiety. We reviewed pros/cons of calcium channel blockers vs. Beta blockers today. Discussed multiple options for medication adjustment. See below.  Denies chest pain, shortness of breath at rest or with normal exertion. No PND, orthopnea, LE edema or unexpected weight gain. No syncope or palpitations.  Past Medical History:  Diagnosis Date  . Anxiety   . 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 Arthritis   . Cancer (HCC)    skin - squamous cell  . Complication of anesthesia    " I had difficulty coming out of it"  . Dyspnea    with exertion  . Family history of adverse reaction to anesthesia    " My daughter has a problem coming ou to it"  . GERD (gastroesophageal reflux disease)   . Headache    migraines in the past  . Hypertension   . Hypothyroidism   . Stroke (HCC) 11/2018   some weakness on right side   . SVT  (supraventricular tachycardia) (HCC)   . Thyroid disease     Past Surgical History:  Procedure Laterality Date  . ABDOMINAL HYSTERECTOMY    . APPENDECTOMY    . CARDIAC CATHETERIZATION    . CARPAL TUNNEL RELEASE Bilateral   . COLONOSCOPY    . DILATION AND CURETTAGE OF UTERUS    . IR ANGIO INTRA EXTRACRAN SEL COM CAROTID INNOMINATE BILAT MOD SED  12/25/2018  . IR ANGIO INTRA EXTRACRAN SEL COM CAROTID INNOMINATE BILAT MOD SED  10/30/2019  . IR ANGIO INTRA EXTRACRAN SEL COM CAROTID INNOMINATE BILAT MOD SED  09/23/2020  . IR ANGIO VERTEBRAL SEL SUBCLAVIAN INNOMINATE UNI L MOD SED  12/25/2018  . IR ANGIO VERTEBRAL SEL SUBCLAVIAN INNOMINATE UNI L MOD SED  10/30/2019  . IR ANGIO VERTEBRAL SEL SUBCLAVIAN INNOMINATE UNI L MOD SED  09/23/2020  . IR ANGIO VERTEBRAL SEL SUBCLAVIAN INNOMINATE UNI R MOD SED  01/04/2019  . IR ANGIO VERTEBRAL SEL VERTEBRAL UNI R MOD SED  12/25/2018  . IR ANGIO VERTEBRAL SEL VERTEBRAL UNI R MOD SED  10/30/2019  . IR ANGIO VERTEBRAL SEL VERTEBRAL UNI R MOD SED  09/23/2020  . IR CT HEAD LTD  01/04/2019  . IR CT HEAD LTD  11/21/2019  . IR INTRA CRAN STENT  01/04/2019  . IR INTRA CRAN STENT  11/21/2019  . IR TRANSCATH EXCRAN VERT OR CAR A STENT  11/17/2020  . IR US GUIDE VASC ACCESS RIGHT  12/25/2018  . IR US GUIDE VASC ACCESS RIGHT  11/02/2019  . OVARIAN CYST REMOVAL    . RADIOLOGY WITH ANESTHESIA N/A 01/04/2019   Procedure: RMCA angioplasty with possible stenting;  Surgeon: Luanne Bras, MD;  Location: [facility];  Service: Radiology;  Laterality: N/A;  . RADIOLOGY WITH ANESTHESIA N/A 11/21/2019   Procedure: IR WITH ANESTHESIA/ STENT PLACEMENT;  Surgeon: Luanne Bras, MD;  Location: [facility];  Service: Radiology;  Laterality: N/A;  . RADIOLOGY WITH ANESTHESIA N/A 10/01/2020   Procedure: IR WITH ANESTHESIA ANGIOPLASTY WITH POSSIBLE STENTING;  Surgeon: Luanne Bras, MD;  Location: [facility];  Service: Radiology;  Laterality: N/A;  . RADIOLOGY WITH ANESTHESIA N/A 11/17/2020    Procedure: IR WITH ANESTHESIA STENT PLACEMENT;  Surgeon: Luanne Bras, MD;  Location: [facility];  Service: Radiology;  Laterality: N/A;    Current Medications: Current Outpatient Medications on File Prior to Visit  Medication Sig  . acetaminophen (TYLENOL) 325 MG tablet Take 325 mg by mouth every 6 (six) hours as needed for headache.   Marland Kitchen aspirin EC 81 MG EC tablet Take 1 tablet (81 mg total) by mouth daily.  Marland Kitchen atorvastatin (LIPITOR) 40 MG tablet Take 1 tablet (40 mg total) by mouth daily at 6 PM.  . busPIRone (BUSPAR) 5 MG tablet Take 5 mg by mouth daily as needed (anxiety).   . Calcium Carb-Cholecalciferol (CALCIUM 600 + D PO) Take 0.5-1 tablets by mouth See admin instructions. Take 1 tablet after breakfast, 1 tablet after lunch, and 0.5 tablet after dinner  . Carboxymethylcellulose Sodium (ARTIFICIAL TEARS OP) Place 1 drop into both eyes 3 (three) times daily as needed (dry eyes).   . Coenzyme Q10 50 MG CAPS Take 50 mg by mouth daily.   Marland Kitchen levothyroxine (SYNTHROID) 75 MCG tablet Take 75 mcg by mouth every Tuesday, Thursday, Saturday, and Sunday.   . levothyroxine (SYNTHROID, LEVOTHROID) 50 MCG tablet Take 50 mcg by mouth every Monday, Wednesday, and Friday.   . loratadine (CLARITIN) 10 MG tablet Take 10 mg by mouth daily.  . Multiple Vitamin (MULTIVITAMIN) capsule Take 1 capsule by mouth daily. Senior  . ticagrelor (BRILINTA) 60 MG TABS tablet Take 30 mg by mouth 2 (two) times daily.   . vitamin E 400 UNIT capsule Take 400 Units by mouth every other day.    No current facility-administered medications on file prior to visit.     Allergies:   Elastic bandages & [zinc], Sulfa antibiotics, Tape, Mandol [cefamandole], and Sinutab non-drowsy max [pseudoephedrine-acetaminophen]   Social History   Tobacco Use  . Smoking status: Never Smoker  . Smokeless tobacco: Never Used  Vaping Use  . Vaping Use: Never used  Substance Use Topics  . Alcohol use: Never  . Drug use: Never    Family  History: family history includes CVA in her maternal aunt and mother; Heart disease in her mother; Peripheral Artery Disease in her father.  ROS:   Please see the history of present illness.  Additional pertinent ROS otherwise unremarkable.  EKGs/Labs/Other Studies Reviewed:    The following studies were reviewed today: Monitor 07/31/20 7 days of data recorded on Zio monitor. Patient had a min HR of 50 bpm, max HR of 158 bpm, and avg HR of 85 bpm. Predominant underlying rhythm was Sinus Rhythm. No VT, atrial fibrillation, high degree block, or pauses noted. Isolated atrial and ventricular ectopy was rare (<1%). 524 Supraventricular Tachycardia runs occurred, the run with the  fastest interval lasting 6 beats with a max rate of 158 bpm, the longest lasting 19 beats with an avg rate of 104 bpm.   Echo 12/09/2018 - Left ventricle: The cavity size was normal. Systolic function was   normal. The estimated ejection fraction was in the range of 60%   to 65%. Wall motion was normal; there were no regional wall   motion abnormalities. Left ventricular diastolic function   parameters were normal. - Left atrium: The atrium was normal in size. - Right ventricle: Systolic function was normal. - Atrial septum: No defect or patent foramen ovale was identified.   Echo contrast study showed no right-to-left atrial level shunt,   at baseline or with provocation. - Pulmonary arteries: Systolic pressure was within the normal   range.  EKG:  EKG is personally reviewed.  The ekg ordered 04/09/20 demonstrates NSR with sinus arrhythmia.  Recent Labs: 11/18/2020: BUN 8; Creatinine, Ser 0.53; Hemoglobin 12.3; Platelets 184; Potassium 3.7; Sodium 135  Recent Lipid Panel    Component Value Date/Time   CHOL 199 12/09/2018 0352   TRIG 110 12/09/2018 0352   HDL 45 12/09/2018 0352   CHOLHDL 4.4 12/09/2018 0352   VLDL 22 12/09/2018 0352   LDLCALC 132 (H) 12/09/2018 0352    Physical Exam:    VS:  BP (!) 176/77    Pulse 84   Ht 5\' 5"  (1.651 m)   Wt 117 lb 3.2 oz (53.2 kg)   SpO2 100%   BMI 19.50 kg/m     Wt Readings from Last 3 Encounters:  01/14/21 117 lb 3.2 oz (53.2 kg)  11/17/20 117 lb (53.1 kg)  11/13/20 117 lb (53.1 kg)    GEN: Well nourished, well developed in no acute distress HEENT: Normal, moist mucous membranes NECK: No JVD CARDIAC: regular rhythm, normal S1 and S2, no rubs or gallops. No murmur. VASCULAR: Radial and DP pulses 2+ bilaterally. No carotid bruits RESPIRATORY:  Clear to auscultation without rales, wheezing or rhonchi  ABDOMEN: Soft, non-tender, non-distended MUSCULOSKELETAL:  Ambulates independently SKIN: Warm and dry, no edema NEUROLOGIC:  Alert and oriented x 3. No focal neuro deficits noted. PSYCHIATRIC:  Normal affect   ASSESSMENT:    1. PSVT (paroxysmal supraventricular tachycardia) ([facility])   2. History of CVA (cerebrovascular accident)   3. Essential hypertension   4. Cardiac risk counseling   5. Counseling on health promotion and disease prevention    PLAN:    Palpitations, tachycardia: -reviewed monitor results. Paroxysmal SVT -see medication adjustment below  Hypertension: labile readings -typically higher in office, use home numbers for medication adjustments -was running symptomatically low at home, better with cessation of lisinopril -tolerated amlodipine 2.5 mg daily. However, given paroxysmal SVT, discussed change to have heart rate control. Discussed diltiazem/verapamil vs. Beta blockers. Discussed pros/cons. After shared decision making, will stop amlodipine and start low dose diltiazem -instructed on red flag warning signs that need immediate medical attention  History of CVA, with need for secondary prevention: -on aspirin, ticagrelor. Ticagrelor duration per interventional neurology -on high intensity atorvastatin -echo 12/09/18 was normal, negative bubble study  Cardiac risk counseling and prevention recommendations: -recommend  heart healthy/Mediterranean diet, with whole grains, fruits, vegetable, fish, lean meats, nuts, and olive oil. Limit salt. -recommend moderate walking, 3-5 times/week for 30-50 minutes each session. Aim for at least 150 minutes.week. Goal should be pace of 3 miles/hours, or walking 1.5 miles in 30 minutes -recommend avoidance of tobacco products. Avoid excess alcohol.  Plan for follow up:  keep previously scheduled appt in March to monitor response to medication change  Medication Adjustments/Labs and Tests Ordered: Current medicines are reviewed at length with the patient today.  Concerns regarding medicines are outlined above.  No orders of the defined types were placed in this encounter.  Meds ordered this encounter  Medications  . diltiazem (CARDIZEM CD) 120 MG 24 hr capsule    Sig: Take 1 capsule (120 mg total) by mouth daily.    Dispense:  30 capsule    Refill:  11    Replaces amlodipine    Patient Instructions  Medication Instructions:  Stop Amlodipine 2.5 mg Start Diltiazem 120 mg daily  *If you need a refill on your cardiac medications before your next appointment, please call your pharmacy*   Lab Work: None   Testing/Procedures: None   Follow-Up: At [facility], you and your health needs are our priority.  As part of our continuing mission to provide you with exceptional heart care, we have created designated Provider Care Teams.  These Care Teams include your primary Cardiologist (physician) and Advanced Practice Providers (APPs -  Physician Assistants and Nurse Practitioners) who all work together to provide you with the care you need, when you need it.  We recommend signing up for the patient portal called "MyChart".  Sign up information is provided on this After Visit Summary.  MyChart is used to connect with patients for Virtual Visits (Telemedicine).  Patients are able to view lab/test results, encounter notes, upcoming appointments, etc.  Non-urgent messages  can be sent to your provider as well.   To learn more about what you can do with MyChart, go to [email or web address].    Your next appointment:   02/18/21 @ 10:20 am  The format for your next appointment:   In Person  Provider:   Buford Dresser, MD     Signed, Buford Dresser, MD PhD 01/14/2021 1:35 PM    [place]

## 2021-01-23 ENCOUNTER — Telehealth: Payer: Self-pay | Admitting: Cardiology

## 2021-01-23 MED ORDER — METOPROLOL SUCCINATE ER 25 MG PO TB24: 25.0000 mg | ORAL_TABLET | Freq: Every day | ORAL | 11 refills | Status: DC

## 2021-01-23 NOTE — Telephone Encounter (Signed)
Spoke with patient. Since starting Cardizem on 2/3 with every dose she develops heaviness in her chest, fatigue and shortness of breath.   2/6: 147/66, 81 before meds. After medication 134/63, 76 developed chest heaviness, fatigue and shortness of breath.  2/7: 127/69, 70 only fatigued  2/8: head groggy, chest heaviness 123/67, 78  2/9: groggy that AM 123/73, 82 at 09:15am after medications 116/59, felt like a semi truck was on her chest and was short of breath.  2/10: 1 hr after cardizem heaviness in chest and groggy  2/11: 126/62, 70. O2 88% RA that rose to 91% RA. Dizzy. 166/89, 81 while on the phone. No chest heaviness at this time no shortness of breath. Has not taken cardizem today.   Spoke with Tommy Medal, Pharm D. Stop Cardizem and start Metoprolol Succinate 25mg  daily. New prescription sent to preferred pharmacy. Note routed to MD.

## 2021-01-23 NOTE — Telephone Encounter (Signed)
Pt c/o medication issue:  1. Name of Medication: diltiazem (CARDIZEM CD) 120 MG 24 hr capsule  2. How are you currently taking this medication (dosage and times per day)? 1 capsule (120 mg total) by mouth daily  3. Are you having a reaction (difficulty breathing--STAT)? Yes.  4. What is your medication issue? Patient states ever since she started this medication on 01/14/21 she has been experiencing dizziness, SOB, and chest tightness.  Pt c/o of Chest Pain: STAT if CP now or developed within 24 hours  1. Are you having CP right now? Not currently. Patient states she feels a little pain, but she mainly has tightness in her chest.   2. Are you experiencing any other symptoms (ex. SOB, nausea, vomiting, sweating)? SOB, dizziness  3. How long have you been experiencing CP? Patient states it began when she started taking Diltiazem on 01/14/21  4. Is your CP continuous or coming and going? Coming and going   5. Have you taken Nitroglycerin? No  ?  STAT if patient feels like he/she is going to faint   1) Are you dizzy now? No   2) Do you feel faint or have you passed out? No   3) Do you have any other symptoms? No   4) Have you checked your HR and BP (record if available)?  01/23/21:   BP-126/62 HR-70 (w/o medication) BP- 103/39  (with medication)  Pt c/o Shortness Of Breath: STAT if SOB developed within the last 24 hours or pt is noticeably SOB on the phone  1. Are you currently SOB (can you hear that pt is SOB on the phone)? No   2. How long have you been experiencing SOB? Since starting medication on 01/14/21  3. Are you SOB when sitting or when up moving around? Both   4. Are you currently experiencing any other symptoms? No

## 2021-01-26 NOTE — Telephone Encounter (Signed)
Agree with plan. Thanks

## 2021-01-30 ENCOUNTER — Telehealth: Payer: Self-pay | Admitting: Cardiology

## 2021-01-30 NOTE — Telephone Encounter (Signed)
Called patient, notified of note from PharmD. Patient verbalized understanding will call next week with update.

## 2021-01-30 NOTE — Telephone Encounter (Signed)
New Message:    Pt says she needs some advice on taking her Metoprolol. She said her blood pressure is running low.   Pt c/o BP issue: STAT if pt c/o blurred vision, one-sided weakness or slurred speech  1. What are your last 5 BP readings? 116/55 this morning pulse 68 122:21 105/58 and pulse 78 at 12:21  2. Are you having any other symptoms (ex. Dizziness, headache, blurred vision, passed out)? no  3. What is your BP issue?  Blood pressure running low- she is completely exhausted

## 2021-01-30 NOTE — Telephone Encounter (Signed)
Blood pressure is considered low. Recent readings are lower than patient's usual readings, but they remain above 90 systolic and generally asymptomatic.   Is expected to feel a little bit more tired after initiating metoprolol (or any beta-blocker) for 7-10 days.  Will continue current therapy without changes unless: patient experience dizziness, blood pressure drops below 90/60, or heart rate persistent drop to low 50s.  Medication is scored and can be split to take 1/2 tablet twice daily , instead of 25mg  all at one time, but no additional medication change recommended.

## 2021-01-30 NOTE — Telephone Encounter (Signed)
Patient called back- she states that she was given this medication on 02/11 by PharmD here: see last note, Spoke with Tommy Medal, Pharm D. Stop Cardizem and start Metoprolol Succinate 25mg  daily. New prescription sent to preferred pharmacy. Note routed to MD.    Patient started medication, but her BP has now just been way lower than normal- she states that the only symptoms she has is feeling tired, and give out.   Yesterday patient states her BP was 124/64 HR 73, then she ate lunch took her meds, and then rechecked later in the day at bedtime and it was 150/74 HR 78.   Patient states this morning it was 116/55 HR 68, she did house work lunch time it was 105/58 HR 74, she ate lunch and took medication and at 1:18 PM she took her medication and now it was 145/68 HR 74.  Overall patient is concerned, she states she does not know what to do about her blood pressure- but she would like to know should she take it a different time during the day, and if she takes it at night is it going to drop her BP to a point where she dies. Patient would just like clarification on when and if she should take the medication at the same time everyday.   I advised with patient it sounds like we should do an appointment, but she would like to know what to do for recommendations first and then make an appointment.   Will route to PharmD, and MD. Thanks!

## 2021-02-01 ENCOUNTER — Encounter: Payer: Self-pay | Admitting: Cardiology

## 2021-02-01 DIAGNOSIS — R0989 Other specified symptoms and signs involving the circulatory and respiratory systems: Secondary | ICD-10-CM | POA: Insufficient documentation

## 2021-02-01 DIAGNOSIS — I471 Supraventricular tachycardia: Secondary | ICD-10-CM | POA: Insufficient documentation

## 2021-02-18 ENCOUNTER — Encounter: Payer: Self-pay | Admitting: Cardiology

## 2021-02-18 ENCOUNTER — Ambulatory Visit (INDEPENDENT_AMBULATORY_CARE_PROVIDER_SITE_OTHER): Payer: Medicare Other | Admitting: Cardiology

## 2021-02-18 ENCOUNTER — Other Ambulatory Visit: Payer: Self-pay

## 2021-02-18 VITALS — BP 152/68 | HR 60 | Ht 64.0 in | Wt 118.0 lb

## 2021-02-18 DIAGNOSIS — R079 Chest pain, unspecified: Secondary | ICD-10-CM | POA: Diagnosis not present

## 2021-02-18 DIAGNOSIS — Z8673 Personal history of transient ischemic attack (TIA), and cerebral infarction without residual deficits: Secondary | ICD-10-CM | POA: Diagnosis not present

## 2021-02-18 DIAGNOSIS — I471 Supraventricular tachycardia: Secondary | ICD-10-CM

## 2021-02-18 DIAGNOSIS — R002 Palpitations: Secondary | ICD-10-CM

## 2021-02-18 DIAGNOSIS — I10 Essential (primary) hypertension: Secondary | ICD-10-CM

## 2021-02-18 NOTE — Patient Instructions (Signed)
Medication Instructions:  Your Physician recommend you continue on your current medication as directed.    *If you need a refill on your cardiac medications before your next appointment, please call your pharmacy*   Lab Work: None  Testing/Procedures: Your physician has requested that you have an exercise tolerance test. For further information please visit [email or web address]. Please also follow instruction sheet, as given. [place]   Follow-Up: At [facility], you and your health needs are our priority.  As part of our continuing mission to provide you with exceptional heart care, we have created designated Provider Care Teams.  These Care Teams include your primary Cardiologist (physician) and Advanced Practice Providers (APPs -  Physician Assistants and Nurse Practitioners) who all work together to provide you with the care you need, when you need it.  We recommend signing up for the patient portal called "MyChart".  Sign up information is provided on this After Visit Summary.  MyChart is used to connect with patients for Virtual Visits (Telemedicine).  Patients are able to view lab/test results, encounter notes, upcoming appointments, etc.  Non-urgent messages can be sent to your provider as well.   To learn more about what you can do with MyChart, go to [email or web address].    Your next appointment:   4-5 week(s)  The format for your next appointment:   In Person  Provider:   Buford Dresser, MD    [facility] at [facility] [address] [place], [place] [address] Phone:  [phone]        You are scheduled for an Exercise Stress Test on   Please arrive 15 minutes prior to your appointment time for registration and insurance purposes.  The test will take approximately 45 minutes to complete.  How to prepare for your Exercise Stress Test: Do bring a list of your current medications with  you.  If not listed below, you may take your medications as normal. . Do not take metoprolol (Lopressor, Toprol) for 24 hours prior to the test.  Bring the medication to your appointment as you may be required to take it once the test is complete. . Do wear comfortable clothes (no dresses or overalls) and walking shoes, tennis shoes preferred (no heels or open toed shoes are allowed) . Do Not wear cologne, perfume, aftershave or lotions (deodorant is allowed). . Please report to [place], [address] for your test.  If these instructions are not followed, your test will have to be rescheduled.  If you have questions or concerns about your appointment, you can call the Stress Lab at [phone].  If you cannot keep your appointment, please provide 24 hours notification to the Stress Lab, to avoid a possible $50 charge to your account

## 2021-02-18 NOTE — Progress Notes (Signed)
Date:  02/18/2021   ID:  Shannon [name], DOB [birth year], MRN [id number]  PCP:  Donalynn Furlong, MD  Cardiologist:  Buford Dresser, MD  Referring MD: Donalynn Furlong, MD   Cc: follow up  History of Present Illness:    Shannon [name] is a 75 y.o. female with a hx of hypertension, CVA who is seen for follow up today. She was seen in the ER on 09/30/19 for concern for stroke, elevated blood pressure. 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  Today: Reviewed phone call from 01/30/21. Had noted blood pressure running low on metoprolol, with readings of 116/55 and 105/58. HR had been 68-78. Felt fatigued but no other high risk symptoms. Changed from diltiazem to metoprolol after that call.   Continues to have fatigue. On diltiazem was having dizzy spells, worst on 2/9. Changed to metoprolol 2/11, but had another dizzy spell 2/15. Now splits the metoprolol dose, some improvement.   Now notes chest heaviness intermittently in the AM. Takes her meds, sits down for bible study. About 10 minutes in, notes heaviness that distracts her, feels some lightheadedness with it as well. Happens about 2-3 times/week. Does have good days where she feels great, but this alternates with severe fatigue where she feels like she can't do anything. Is able to walk about a mile, no symptoms with exertion, but feels very tired. Feels that incline, hot weather are the most difficult for her.   On days where her blood pressure is higher, feels the pounding in her head. Blood pressure remains labile, symptomatic with both high and (low for her) low numbers.  Rare racing heartbeats, had an episode yesterday AM. Took vitals at the time, 118/55, HR 68. At bedtime, BP was 154/70. HR 68. Had been able to walk her mile, do housework.  Sleeping less on metoprolol than she was on diltiazem. Didn't sleep last night. Woke up with a headache. Feeling  stressed this AM, readings always higher in the office.   Reviewed BP log since 2/19 (when she started the 1/2 metoprolol BID). Range 110s-160s/70s-80s, HR in the 60s. 160s are rare, usually 875-643P systolic.  Has followed her thyroid with her PCP. Wondering if she needs her B12 checked. Deferred to her PCP on this. No macrocytic anemia that I see.   We discussed options for evaluation of chest heaviness. She was trialed on a treadmill in the past, couldn't get her heart rate to target at that time. Didn't covert to [place]. On Care Everywere, reviewed ETT 2019 at [facility], see below. Discussed options for further evaluation today.  Past Medical History:  Diagnosis Date   Anxiety    Artery stenosis ([place])    cerebral   Arthritis    Cancer ([place])    skin - squamous cell   Complication of anesthesia    " I had difficulty coming out of it"   Dyspnea    with exertion   Family history of adverse reaction to anesthesia    " My daughter has a problem coming ou to it"   GERD (gastroesophageal reflux disease)    Headache    migraines in the past   Hypertension    Hypothyroidism    Stroke [facility]) 11/2018   some weakness on right side    SVT (supraventricular tachycardia) ([place])    Thyroid disease     Past Surgical History:  Procedure Laterality Date   ABDOMINAL HYSTERECTOMY     APPENDECTOMY  CARDIAC CATHETERIZATION     CARPAL TUNNEL RELEASE Bilateral    COLONOSCOPY     DILATION AND CURETTAGE OF UTERUS     IR ANGIO INTRA EXTRACRAN SEL COM CAROTID INNOMINATE BILAT MOD SED  12/25/2018   IR ANGIO INTRA EXTRACRAN SEL COM CAROTID INNOMINATE BILAT MOD SED  10/30/2019   IR ANGIO INTRA EXTRACRAN SEL COM CAROTID INNOMINATE BILAT MOD SED  09/23/2020   IR ANGIO VERTEBRAL SEL SUBCLAVIAN INNOMINATE UNI L MOD SED  12/25/2018   IR ANGIO VERTEBRAL SEL SUBCLAVIAN INNOMINATE UNI L MOD SED  10/30/2019   IR ANGIO VERTEBRAL SEL SUBCLAVIAN INNOMINATE UNI L MOD SED  09/23/2020   IR ANGIO VERTEBRAL SEL  SUBCLAVIAN INNOMINATE UNI R MOD SED  01/04/2019   IR ANGIO VERTEBRAL SEL VERTEBRAL UNI R MOD SED  12/25/2018   IR ANGIO VERTEBRAL SEL VERTEBRAL UNI R MOD SED  10/30/2019   IR ANGIO VERTEBRAL SEL VERTEBRAL UNI R MOD SED  09/23/2020   IR CT HEAD LTD  01/04/2019   IR CT HEAD LTD  11/21/2019   IR INTRA CRAN STENT  01/04/2019   IR INTRA CRAN STENT  11/21/2019   IR TRANSCATH EXCRAN VERT OR CAR A STENT  11/17/2020   IR US GUIDE VASC ACCESS RIGHT  12/25/2018   IR US GUIDE VASC ACCESS RIGHT  11/02/2019   OVARIAN CYST REMOVAL     RADIOLOGY WITH ANESTHESIA N/A 01/04/2019   Procedure: RMCA angioplasty with possible stenting;  Surgeon: Luanne Bras, MD;  Location: [place];  Service: Radiology;  Laterality: N/A;   RADIOLOGY WITH ANESTHESIA N/A 11/21/2019   Procedure: IR WITH ANESTHESIA/ STENT PLACEMENT;  Surgeon: Luanne Bras, MD;  Location: [place];  Service: Radiology;  Laterality: N/A;   RADIOLOGY WITH ANESTHESIA N/A 10/01/2020   Procedure: IR WITH ANESTHESIA ANGIOPLASTY WITH POSSIBLE STENTING;  Surgeon: Luanne Bras, MD;  Location: [place];  Service: Radiology;  Laterality: N/A;   RADIOLOGY WITH ANESTHESIA N/A 11/17/2020   Procedure: IR WITH ANESTHESIA STENT PLACEMENT;  Surgeon: Luanne Bras, MD;  Location: [place];  Service: Radiology;  Laterality: N/A;    Current Medications: Current Outpatient Medications on File Prior to Visit  Medication Sig   acetaminophen (TYLENOL) 325 MG tablet Take 325 mg by mouth every 6 (six) hours as needed for headache.    aspirin EC 81 MG EC tablet Take 1 tablet (81 mg total) by mouth daily.   atorvastatin (LIPITOR) 40 MG tablet Take 1 tablet (40 mg total) by mouth daily at 6 PM.   busPIRone (BUSPAR) 5 MG tablet Take 5 mg by mouth daily as needed (anxiety).    Calcium Carb-Cholecalciferol (CALCIUM 600 + D PO) Take 0.5-1 tablets by mouth See admin instructions. Take 1 tablet after breakfast, 1 tablet after lunch, and 0.5 tablet after dinner    Carboxymethylcellulose Sodium (ARTIFICIAL TEARS OP) Place 1 drop into both eyes 3 (three) times daily as needed (dry eyes).    Coenzyme Q10 50 MG CAPS Take 50 mg by mouth daily.    levothyroxine (SYNTHROID) 75 MCG tablet Take 75 mcg by mouth every Tuesday, Thursday, Saturday, and Sunday.    levothyroxine (SYNTHROID, LEVOTHROID) 50 MCG tablet Take 50 mcg by mouth every Monday, Wednesday, and Friday.    loratadine (CLARITIN) 10 MG tablet Take 10 mg by mouth daily.   metoprolol succinate (TOPROL-XL) 25 MG 24 hr tablet Take 1 tablet (25 mg total) by mouth daily. Take with or immediately following a meal. (Patient taking differently: Take 12.5 mg  by mouth in the morning and at bedtime. Take with or immediately following a meal.)   Multiple Vitamin (MULTIVITAMIN) capsule Take 1 capsule by mouth daily. Senior   ticagrelor (BRILINTA) 60 MG TABS tablet Take 30 mg by mouth 2 (two) times daily.    vitamin E 400 UNIT capsule Take 400 Units by mouth every other day.    No current facility-administered medications on file prior to visit.     Allergies:   Elastic bandages & [zinc], Sulfa antibiotics, Tape, Mandol [cefamandole], and Sinutab non-drowsy max [pseudoephedrine-acetaminophen]   Social History   Tobacco Use   Smoking status: Never Smoker   Smokeless tobacco: Never Used  Vaping Use   Vaping Use: Never used  Substance Use Topics   Alcohol use: Never   Drug use: Never    Family History: family history includes CVA in her maternal aunt and mother; Heart disease in her mother; Peripheral Artery Disease in her father.  ROS:   Please see the history of present illness.  Additional pertinent ROS otherwise unremarkable.  EKGs/Labs/Other Studies Reviewed:    The following studies were reviewed today: Monitor 07/31/20 7 days of data recorded on Zio monitor. Patient had a min HR of 50 bpm, max HR of 158 bpm, and avg HR of 85 bpm. Predominant underlying rhythm was Sinus Rhythm. No VT, atrial  fibrillation, high degree block, or pauses noted. Isolated atrial and ventricular ectopy was rare (<1%). 524 Supraventricular Tachycardia runs occurred, the run with the fastest interval lasting 6 beats with a max rate of 158 bpm, the longest lasting 19 beats with an avg rate of 104 bpm.   Echo 12/09/2018 - Left ventricle: The cavity size was normal. Systolic function was   normal. The estimated ejection fraction was in the range of 60%   to 65%. Wall motion was normal; there were no regional wall   motion abnormalities. Left ventricular diastolic function   parameters were normal. - Left atrium: The atrium was normal in size. - Right ventricle: Systolic function was normal. - Atrial septum: No defect or patent foramen ovale was identified.   Echo contrast study showed no right-to-left atrial level shunt,   at baseline or with provocation. - Pulmonary arteries: Systolic pressure was within the normal   range.  ETT 09/04/2018 ([facility]) Baseline HR bpm 70   Baseline BP mmHg 186/83   Peak HR bpm 121   PMHR % % 82   Peak BP mmHg 213/80   Exercise duration (min) min 9   Exercise duration (sec) sec 0   Stage Reached  3   Estimated workload METS 10.1   ST Deviation mm 0.0   D[facility] Treadmill Score  5.0   Angina Score for [facility] Treadmill Score  1    Narrative    Minor non-specific ST and T wave changes noted during stress or recovery   Overall, the patient's exercise capacity was excellent.   Negative ETT for ischemia at workload achieved   The patient experienced atypical chest pain during the stress test.   No significant arrhythmia noted   Reassurance.  BP control important.    EKG:  EKG is personally reviewed.  The ekg ordered today demonstrates NSR at 60 bpm.  Recent Labs: 11/18/2020: BUN 8; Creatinine, Ser 0.53; Hemoglobin 12.3; Platelets 184; Potassium 3.7; Sodium 135  Recent Lipid Panel    Component Value Date/Time   CHOL 199 12/09/2018 0352   TRIG 110 12/09/2018 0352    HDL 45 12/09/2018 0352  CHOLHDL 4.4 12/09/2018 0352   VLDL 22 12/09/2018 0352   LDLCALC 132 (H) 12/09/2018 0352    Physical Exam:    VS:  BP (!) 152/68 (BP Location: Left Arm, Patient Position: Sitting, Cuff Size: Normal)   Pulse 60   Ht 5\' 4"  (1.626 m)   Wt 118 lb (53.5 kg)   BMI 20.25 kg/m     Wt Readings from Last 3 Encounters:  02/18/21 118 lb (53.5 kg)  01/14/21 117 lb 3.2 oz (53.2 kg)  11/17/20 117 lb (53.1 kg)    GEN: Well nourished, well developed in no acute distress HEENT: Normal, moist mucous membranes NECK: No JVD CARDIAC: regular rhythm, normal S1 and S2, no rubs or gallops. No murmur. VASCULAR: Radial and DP pulses 2+ bilaterally. No carotid bruits RESPIRATORY:  Clear to auscultation without rales, wheezing or rhonchi  ABDOMEN: Soft, non-tender, non-distended MUSCULOSKELETAL:  Ambulates independently SKIN: Warm and dry, no edema NEUROLOGIC:  Alert and oriented x 3. No focal neuro deficits noted. PSYCHIATRIC:  Normal affect    ASSESSMENT:    1. Chest pain, unspecified type   2. PSVT (paroxysmal supraventricular tachycardia) ([place])   3. History of CVA (cerebrovascular accident)   4. Palpitation   5. Essential hypertension    PLAN:    Chest heaviness: -discussed treadmill stress, nuclear stress/lexiscan, and CT coronary angiography. Discussed pros and cons of each, including but not limited to false positive/false negative risk, radiation risk, and risk of IV contrast dye. Based on shared decision making, decision was made to pursue exercise treadmill stress test -will hold AV nodal agents prior -counseled on red flag warning signs that need immediate medical attention  Palpitations, tachycardia: -reviewed monitor results. Paroxysmal SVT -continue metoprolol succinate  Hypertension: less labile but still somewhat variable -typically higher in office, use home numbers for medication adjustments -was running symptomatically low at home, better with  cessation of lisinopril -did not do well with diltiazem (fatigue, shortness of breath, chest heaviness), tolerating metoprolol succinate -instructed on red flag warning signs that need immediate medical attention   History of CVA, with need for secondary prevention: -on aspirin, ticagrelor. Ticagrelor duration per interventional neurology -on high intensity atorvastatin -echo 12/09/18 was normal, negative bubble study  Cardiac risk counseling and prevention recommendations: -recommend heart healthy/Mediterranean diet, with whole grains, fruits, vegetable, fish, lean meats, nuts, and olive oil. Limit salt. -recommend moderate walking, 3-5 times/week for 30-50 minutes each session. Aim for at least 150 minutes.week. Goal should be pace of 3 miles/hours, or walking 1.5 miles in 30 minutes -recommend avoidance of tobacco products. Avoid excess alcohol.  Plan for follow up: 4-5 weeks, after stress test  Medication Adjustments/Labs and Tests Ordered: Current medicines are reviewed at length with the patient today.  Concerns regarding medicines are outlined above.  Orders Placed This Encounter  Procedures   Cardiac Stress Test: Informed Consent Details: Physician/Practitioner Attestation; Transcribe to consent form and obtain patient signature   EXERCISE TOLERANCE TEST (ETT)   EKG 12-Lead   No orders of the defined types were placed in this encounter.   Patient Instructions  Medication Instructions:  Your Physician recommend you continue on your current medication as directed.    *If you need a refill on your cardiac medications before your next appointment, please call your pharmacy*   Lab Work: None  Testing/Procedures: Your physician has requested that you have an exercise tolerance test. For further information please visit [email or web address]. Please also follow instruction sheet, as given. [place]   Follow-Up: At [facility], you and your health  needs are our priority.  As part of our continuing mission to provide you with exceptional heart care, we have created designated Provider Care Teams.  These Care Teams include your primary Cardiologist (physician) and Advanced Practice Providers (APPs -  Physician Assistants and Nurse Practitioners) who all work together to provide you with the care you need, when you need it.  We recommend signing up for the patient portal called "MyChart".  Sign up information is provided on this After Visit Summary.  MyChart is used to connect with patients for Virtual Visits (Telemedicine).  Patients are able to view lab/test results, encounter notes, upcoming appointments, etc.  Non-urgent messages can be sent to your provider as well.   To learn more about what you can do with MyChart, go to [email or web address].    Your next appointment:   4-5 week(s)  The format for your next appointment:   In Person  Provider:   Buford Dresser, MD     [facility] Cardiovascular Imaging at [facility] [address], [place], [place] [address] Phone:  [phone]        You are scheduled for an Exercise Stress Test on   Please arrive 15 minutes prior to your appointment time for registration and insurance purposes.  The test will take approximately 45 minutes to complete.  How to prepare for your Exercise Stress Test: Do bring a list of your current medications with you.  If not listed below, you may take your medications as normal. Do not take metoprolol (Lopressor, Toprol) for 24 hours prior to the test.  Bring the medication to your appointment as you may be required to take it once the test is complete. Do wear comfortable clothes (no dresses or overalls) and walking shoes, tennis shoes preferred (no heels or open toed shoes are allowed) Do Not wear cologne, perfume, aftershave or lotions (deodorant is allowed). Please report to [place], [address] for your  test.  If these instructions are not followed, your test will have to be rescheduled.  If you have questions or concerns about your appointment, you can call the Stress Lab at [phone].  If you cannot keep your appointment, please provide 24 hours notification to the Stress Lab, to avoid a possible $50 charge to your account  Signed, Buford Dresser, MD PhD 02/18/2021     [place]

## 2021-02-24 ENCOUNTER — Telehealth (HOSPITAL_COMMUNITY): Payer: Self-pay

## 2021-02-24 ENCOUNTER — Other Ambulatory Visit (HOSPITAL_COMMUNITY)
Admission: RE | Admit: 2021-02-24 | Discharge: 2021-02-24 | Disposition: A | Discharge status: Home / Self Care | Payer: Medicare Other | Source: Ambulatory Visit | Attending: Cardiology | Admitting: Cardiology

## 2021-02-24 DIAGNOSIS — Z01812 Encounter for preprocedural laboratory examination: Secondary | ICD-10-CM | POA: Insufficient documentation

## 2021-02-24 DIAGNOSIS — Z20822 Contact with and (suspected) exposure to covid-19: Secondary | ICD-10-CM | POA: Insufficient documentation

## 2021-02-24 LAB — SARS CORONAVIRUS 2 (TAT 6-24 HRS): SARS Coronavirus 2: NEGATIVE

## 2021-02-24 NOTE — Telephone Encounter (Signed)
Called to schedule f/u consult, no answer, left vm. AW  

## 2021-02-25 ENCOUNTER — Telehealth (HOSPITAL_COMMUNITY): Payer: Self-pay | Admitting: *Deleted

## 2021-02-25 NOTE — Telephone Encounter (Signed)
Close encounter 

## 2021-02-26 ENCOUNTER — Other Ambulatory Visit: Payer: Self-pay

## 2021-02-26 ENCOUNTER — Ambulatory Visit (HOSPITAL_COMMUNITY)
Admission: RE | Admit: 2021-02-26 | Discharge: 2021-02-26 | Disposition: A | Discharge status: Home / Self Care | Payer: Medicare Other | Source: Ambulatory Visit | Attending: Internal Medicine | Admitting: Internal Medicine

## 2021-02-26 DIAGNOSIS — R079 Chest pain, unspecified: Secondary | ICD-10-CM | POA: Diagnosis present

## 2021-02-26 LAB — EXERCISE TOLERANCE TEST
Estimated workload: 7.4 METS
Exercise duration (min): 6 min
Exercise duration (sec): 19 s
MPHR: 145 {beats}/min
Peak HR: 108 {beats}/min
Percent HR: 74 %
Rest HR: 76 {beats}/min

## 2021-02-27 ENCOUNTER — Telehealth: Payer: Self-pay | Admitting: Student

## 2021-02-27 NOTE — Telephone Encounter (Signed)
NIR.  Received prescription refill request from pharmacy. Faxed filled prescription refill to [facility] [phone] at 1218- Brilinta 60 mg tablets, take 1/2 tablet by mouth twice daily, dispense 30 tablets with 3 refills.   Bea Graff Louk, PA-C 02/27/2021, 12:34 PM

## 2021-03-11 ENCOUNTER — Ambulatory Visit (HOSPITAL_COMMUNITY)
Admission: RE | Admit: 2021-03-11 | Discharge: 2021-03-11 | Disposition: A | Discharge status: Home / Self Care | Payer: Medicare Other | Source: Ambulatory Visit | Attending: Student | Admitting: Student

## 2021-03-11 ENCOUNTER — Other Ambulatory Visit: Payer: Self-pay

## 2021-03-11 DIAGNOSIS — G450 Vertebro-basilar artery syndrome: Secondary | ICD-10-CM

## 2021-03-12 HISTORY — PX: IR RADIOLOGIST EVAL & MGMT: IMG5224

## 2021-03-16 ENCOUNTER — Encounter: Payer: Self-pay | Admitting: Cardiology

## 2021-03-16 ENCOUNTER — Other Ambulatory Visit: Payer: Self-pay

## 2021-03-16 ENCOUNTER — Ambulatory Visit (INDEPENDENT_AMBULATORY_CARE_PROVIDER_SITE_OTHER): Payer: Medicare Other | Admitting: Cardiology

## 2021-03-16 VITALS — BP 126/84 | HR 54 | Ht 65.0 in | Wt 118.0 lb

## 2021-03-16 DIAGNOSIS — I10 Essential (primary) hypertension: Secondary | ICD-10-CM

## 2021-03-16 DIAGNOSIS — Z8673 Personal history of transient ischemic attack (TIA), and cerebral infarction without residual deficits: Secondary | ICD-10-CM | POA: Diagnosis not present

## 2021-03-16 DIAGNOSIS — Z7189 Other specified counseling: Secondary | ICD-10-CM

## 2021-03-16 DIAGNOSIS — I471 Supraventricular tachycardia, unspecified: Secondary | ICD-10-CM

## 2021-03-16 DIAGNOSIS — R079 Chest pain, unspecified: Secondary | ICD-10-CM

## 2021-03-16 DIAGNOSIS — Z712 Person consulting for explanation of examination or test findings: Secondary | ICD-10-CM | POA: Diagnosis not present

## 2021-03-16 NOTE — Progress Notes (Signed)
Date:  03/16/2021   ID:  Shannon [name], DOB [birth year], MRN [id number]  PCP:  Donalynn Furlong, MD  Cardiologist:  Buford Dresser, MD  Referring MD: Donalynn Furlong, MD   Cc: follow up  History of Present Illness:    Shannon [name] is a 75 y.o. female with a hx of hypertension, CVA who is seen for follow up today. She was seen in the ER on 09/30/19 for concern for stroke, elevated blood pressure. 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  Today:  She completed a stress test recently and she reports that the 1st and 2nd level was fine until she reached the 3rd level it became unbearable to complete. We reviewed the results today. We discussed that technically it was a nondiagnostic test. We discussed options for additional testing, see below.  She continues to have fatigue, wondering if her BP is the cause. Her at home BP ranged 112-175/72-81 last month.She states that one day to had a headache and her BP was 175/78 but after completing some deep breathing exercise it decreased to 162/79.    She has been walking 1-2 miles daily. She has some pain her lower chest wall at times but not at her central chest region. She denies any shortness of breath, chest pain or tightness. She continues to have "dizzy spells" but no syncopal episodes. She thinks that once she has her cataract procedure complete this will likely change. She denies any PND, orthopnea, or LE edema. She denies any loss of breath during her sleep. She plans to focus on completing her cataract and CT angiogram first before considering any cardiac procedures.   Past Medical History:  Diagnosis Date  . Anxiety   . 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 Arthritis   . Cancer (HCC)    skin - squamous cell  . Complication of anesthesia    " I had difficulty coming out of it"  . Dyspnea    with exertion  . Family history of adverse reaction to  anesthesia    " My daughter has a problem coming ou to it"  . GERD (gastroesophageal reflux disease)   . Headache    migraines in the past  . Hypertension   . Hypothyroidism   . Stroke ([place]) 11/2018   some weakness on right side   . SVT (supraventricular tachycardia) ([place])   . Thyroid disease     Past Surgical History:  Procedure Laterality Date  . ABDOMINAL HYSTERECTOMY    . APPENDECTOMY    . CARDIAC CATHETERIZATION    . CARPAL TUNNEL RELEASE Bilateral   . COLONOSCOPY    . DILATION AND CURETTAGE OF UTERUS    . IR ANGIO INTRA EXTRACRAN SEL COM CAROTID INNOMINATE BILAT MOD SED  12/25/2018  . IR ANGIO INTRA EXTRACRAN SEL COM CAROTID INNOMINATE BILAT MOD SED  10/30/2019  . IR ANGIO INTRA EXTRACRAN SEL COM CAROTID INNOMINATE BILAT MOD SED  09/23/2020  . IR ANGIO VERTEBRAL SEL SUBCLAVIAN INNOMINATE UNI L MOD SED  12/25/2018  . IR ANGIO VERTEBRAL SEL SUBCLAVIAN INNOMINATE UNI L MOD SED  10/30/2019  . IR ANGIO VERTEBRAL SEL SUBCLAVIAN INNOMINATE UNI L MOD SED  09/23/2020  . IR ANGIO VERTEBRAL SEL SUBCLAVIAN INNOMINATE UNI R MOD SED  01/04/2019  . IR ANGIO VERTEBRAL SEL VERTEBRAL UNI R MOD SED  12/25/2018  . IR ANGIO VERTEBRAL SEL VERTEBRAL UNI R MOD SED  10/30/2019  . IR  ANGIO VERTEBRAL SEL VERTEBRAL UNI R MOD SED  09/23/2020  . IR CT HEAD LTD  01/04/2019  . IR CT HEAD LTD  11/21/2019  . IR INTRA CRAN STENT  01/04/2019  . IR INTRA CRAN STENT  11/21/2019  . IR RADIOLOGIST EVAL & MGMT  03/12/2021  . IR TRANSCATH EXCRAN VERT OR CAR A STENT  11/17/2020  . IR US GUIDE VASC ACCESS RIGHT  12/25/2018  . IR US GUIDE VASC ACCESS RIGHT  11/02/2019  . OVARIAN CYST REMOVAL    . RADIOLOGY WITH ANESTHESIA N/A 01/04/2019   Procedure: RMCA angioplasty with possible stenting;  Surgeon: Luanne Bras, MD;  Location: [facility];  Service: Radiology;  Laterality: N/A;  . RADIOLOGY WITH ANESTHESIA N/A 11/21/2019   Procedure: IR WITH ANESTHESIA/ STENT PLACEMENT;  Surgeon: Luanne Bras, MD;  Location: [place];   Service: Radiology;  Laterality: N/A;  . RADIOLOGY WITH ANESTHESIA N/A 10/01/2020   Procedure: IR WITH ANESTHESIA ANGIOPLASTY WITH POSSIBLE STENTING;  Surgeon: Luanne Bras, MD;  Location: [place];  Service: Radiology;  Laterality: N/A;  . RADIOLOGY WITH ANESTHESIA N/A 11/17/2020   Procedure: IR WITH ANESTHESIA STENT PLACEMENT;  Surgeon: Luanne Bras, MD;  Location: [place];  Service: Radiology;  Laterality: N/A;    Current Medications: Current Outpatient Medications on File Prior to Visit  Medication Sig  . acetaminophen (TYLENOL) 325 MG tablet Take 325 mg by mouth every 6 (six) hours as needed for headache.   Marland Kitchen aspirin EC 81 MG EC tablet Take 1 tablet (81 mg total) by mouth daily.  Marland Kitchen atorvastatin (LIPITOR) 40 MG tablet Take 1 tablet (40 mg total) by mouth daily at 6 PM.  . busPIRone (BUSPAR) 5 MG tablet Take 5 mg by mouth daily as needed (anxiety).   . Calcium Carb-Cholecalciferol (CALCIUM 600 + D PO) Take 0.5-1 tablets by mouth See admin instructions. Take 1 tablet after breakfast, 1 tablet after lunch, and 0.5 tablet after dinner  . Carboxymethylcellulose Sodium (ARTIFICIAL TEARS OP) Place 1 drop into both eyes 3 (three) times daily as needed (dry eyes).   . Coenzyme Q10 50 MG CAPS Take 50 mg by mouth daily.   Marland Kitchen levothyroxine (SYNTHROID) 75 MCG tablet Take 75 mcg by mouth every Tuesday, Thursday, Saturday, and Sunday.   . levothyroxine (SYNTHROID, LEVOTHROID) 50 MCG tablet Take 50 mcg by mouth every Monday, Wednesday, and Friday.   . loratadine (CLARITIN) 10 MG tablet Take 10 mg by mouth daily.  . metoprolol succinate (TOPROL-XL) 25 MG 24 hr tablet Take 1 tablet (25 mg total) by mouth daily. Take with or immediately following a meal. (Patient taking differently: Take 12.5 mg by mouth in the morning and at bedtime. Take with or immediately following a meal.)  . Multiple Vitamin (MULTIVITAMIN) capsule Take 1 capsule by mouth daily. Senior  . ticagrelor (BRILINTA) 60 MG TABS tablet Take  30 mg by mouth 2 (two) times daily.   . vitamin E 400 UNIT capsule Take 400 Units by mouth every other day.    No current facility-administered medications on file prior to visit.     Allergies:   Elastic bandages & [zinc], Sulfa antibiotics, Tape, Mandol [cefamandole], and Sinutab non-drowsy max [pseudoephedrine-acetaminophen]   Social History   Tobacco Use  . Smoking status: Never Smoker  . Smokeless tobacco: Never Used  Vaping Use  . Vaping Use: Never used  Substance Use Topics  . Alcohol use: Never  . Drug use: Never    Family History: family history includes CVA  in her maternal aunt and mother; Heart disease in her mother; Peripheral Artery Disease in her father.  ROS:   Please see the history of present illness.  Additional pertinent ROS otherwise unremarkable.  EKGs/Labs/Other Studies Reviewed:    The following studies were reviewed today: ETT Mar 03, 2021:  Blood pressure demonstrated a normal response to exercise.  Upsloping ST segment depression ST segment depression of 1 mm was noted during stress in the V5, V6, III and aVF leads.   ETT with fair exercise tolerance (6:19); no chest pain; normal blood pressure response; study terminated secondary to dyspnea; 1 mm of upsloping to horizontal ST depression in the inferior lateral leads of borderline significance; patient achieved 74% (inadequate) predicted maximum heart rate; suggest alternative study if clinical suspicion of ischemia high (nuclear study or cardiac CTA).  Monitor 07/31/20 7 days of data recorded on Zio monitor. Patient had a min HR of 50 bpm, max HR of 158 bpm, and avg HR of 85 bpm. Predominant underlying rhythm was Sinus Rhythm. No VT, atrial fibrillation, high degree block, or pauses noted. Isolated atrial and ventricular ectopy was rare (<1%). 524 Supraventricular Tachycardia runs occurred, the run with the fastest interval lasting 6 beats with a max rate of 158 bpm, the longest lasting 19 beats with an avg  rate of 104 bpm.   Echo 12/09/2018 - Left ventricle: The cavity size was normal. Systolic function was   normal. The estimated ejection fraction was in the range of 60%   to 65%. Wall motion was normal; there were no regional wall   motion abnormalities. Left ventricular diastolic function   parameters were normal. - Left atrium: The atrium was normal in size. - Right ventricle: Systolic function was normal. - Atrial septum: No defect or patent foramen ovale was identified.   Echo contrast study showed no right-to-left atrial level shunt,   at baseline or with provocation. - Pulmonary arteries: Systolic pressure was within the normal   range.  ETT 09/04/2018 ([facility]) Baseline HR bpm 70   Baseline BP mmHg 186/83   Peak HR bpm 121   PMHR % % 82   Peak BP mmHg 213/80   Exercise duration (min) min 9   Exercise duration (sec) sec 0   Stage Reached  3   Estimated workload METS 10.1   ST Deviation mm 0.0   Duke Treadmill Score  5.0   Angina Score for Duke Treadmill Score  1    Narrative    Minor non-specific ST and T wave changes noted during stress or recovery   Overall, the patient's exercise capacity was excellent.   Negative ETT for ischemia at workload achieved   The patient experienced atypical chest pain during the stress test.   No significant arrhythmia noted   Reassurance.  BP control important.    EKG:  Personally reviewed today 4/22- EKG was not ordered today 04/09/20- NSR with sinus arrhythmia  Recent Labs: 11/18/2020: BUN 8; Creatinine, Ser 0.53; Hemoglobin 12.3; Platelets 184; Potassium 3.7; Sodium 135  Recent Lipid Panel    Component Value Date/Time   CHOL 199 12/09/2018 0352   TRIG 110 12/09/2018 0352   HDL 45 12/09/2018 0352   CHOLHDL 4.4 12/09/2018 0352   VLDL 22 12/09/2018 0352   LDLCALC 132 (H) 12/09/2018 0352    Physical Exam:    VS:  BP 126/84   Pulse (!) 54   Ht 5\' 5"  (1.651 m)   Wt 118 lb (53.5 kg)   SpO2 99%  BMI 19.64 kg/m      Wt Readings from Last 3 Encounters:  03/16/21 118 lb (53.5 kg)  02/18/21 118 lb (53.5 kg)  01/14/21 117 lb 3.2 oz (53.2 kg)    GEN: Well nourished, well developed in no acute distress HEENT: Normal, moist mucous membranes NECK: No JVD CARDIAC: regular rhythm, normal S1 and S2, no rubs or gallops. No murmur. VASCULAR: Radial and DP pulses 2+ bilaterally. No carotid bruits RESPIRATORY:  Clear to auscultation without rales, wheezing or rhonchi  ABDOMEN: Soft, non-tender, non-distended MUSCULOSKELETAL:  Ambulates independently SKIN: Warm and dry, no edema NEUROLOGIC:  Alert and oriented x 3. No focal neuro deficits noted. PSYCHIATRIC:  Normal affect   ASSESSMENT:    1. Encounter to discuss test results   2. Chest pain, unspecified type   3. History of CVA (cerebrovascular accident)   4. Essential hypertension   5. Cardiac risk counseling   6. Counseling on health promotion and disease prevention   7. PSVT (paroxysmal supraventricular tachycardia) (HCC)    PLAN:    Atypical chest pain, fatigue: -reviewed ETT. Nonspecific changes but nondiagnostic -we discussed options for further evaluation, including risk/benefit of myoview or CT coronary. We also discussed coronary angiography. -after shared decision making, she wants to not pursue testing at this time, wants to complete her cataract procedure and her brain CT angio and then re-discuss at our next visit -cautioned on red flag warning signs that need immediate medical attention  Palpitations, tachycardia: -monitor shows paroxysmal SVT  Hypertension: labile readings -typically higher in office, use home numbers for medication adjustments -was running symptomatically low at home, better with cessation of lisinopril and amlodipine -did not tolerate diltiazem for pSVT, changed to metoprolol -her range is well controlled. Rare spikes, usually linked to headache/discomfort. No severe lows like she was having prior  History of  CVA, with need for secondary prevention: -on aspirin, ticagrelor. Ticagrelor duration per interventional neurology -on high intensity atorvastatin -echo 12/09/18 was normal, negative bubble study  Cardiac risk counseling and prevention recommendations: -recommend heart healthy/Mediterranean diet, with whole grains, fruits, vegetable, fish, lean meats, nuts, and olive oil. Limit salt. -recommend moderate walking, 3-5 times/week for 30-50 minutes each session. Aim for at least 150 minutes.week. Goal should be pace of 3 miles/hours, or walking 1.5 miles in 30 minutes -recommend avoidance of tobacco products. Avoid excess alcohol.  Plan for follow up: 3 months   Medication Adjustments/Labs and Tests Ordered: Current medicines are reviewed at length with the patient today.  Concerns regarding medicines are outlined above.  No orders of the defined types were placed in this encounter.  No orders of the defined types were placed in this encounter.   Patient Instructions  Medication Instructions:  Your Physician recommend you continue on your current medication as directed.    *If you need a refill on your cardiac medications before your next appointment, please call your pharmacy*   Lab Work: None   Testing/Procedures: None   Follow-Up: At [facility], you and your health needs are our priority.  As part of our continuing mission to provide you with exceptional heart care, we have created designated Provider Care Teams.  These Care Teams include your primary Cardiologist (physician) and Advanced Practice Providers (APPs -  Physician Assistants and Nurse Practitioners) who all work together to provide you with the care you need, when you need it.  We recommend signing up for the patient portal called "MyChart".  Sign up information is provided on this After  Visit Summary.  MyChart is used to connect with patients for Virtual Visits (Telemedicine).  Patients are able to view lab/test  results, encounter notes, upcoming appointments, etc.  Non-urgent messages can be sent to your provider as well.   To learn more about what you can do with MyChart, go to [email or web address].    Your next appointment:   3 month(s) @ [address] [place], [place] [address]   The format for your next appointment:   In Person  Provider:   Buford Dresser, MD         I,Alexis Bryant,acting as a scribe for Buford Dresser, MD.,have documented all relevant documentation on the behalf of Buford Dresser, MD,as directed by  Buford Dresser, MD while in the presence of Buford Dresser, MD.  Signed, Buford Dresser, MD PhD 03/16/2021    [place]

## 2021-03-16 NOTE — Patient Instructions (Signed)
Medication Instructions:  Your Physician recommend you continue on your current medication as directed.    *If you need a refill on your cardiac medications before your next appointment, please call your pharmacy*   Lab Work: None   Testing/Procedures: None   Follow-Up: At [facility], you and your health needs are our priority.  As part of our continuing mission to provide you with exceptional heart care, we have created designated Provider Care Teams.  These Care Teams include your primary Cardiologist (physician) and Advanced Practice Providers (APPs -  Physician Assistants and Nurse Practitioners) who all work together to provide you with the care you need, when you need it.  We recommend signing up for the patient portal called "MyChart".  Sign up information is provided on this After Visit Summary.  MyChart is used to connect with patients for Virtual Visits (Telemedicine).  Patients are able to view lab/test results, encounter notes, upcoming appointments, etc.  Non-urgent messages can be sent to your provider as well.   To learn more about what you can do with MyChart, go to [email or web address].    Your next appointment:   3 month(s) @ [address] [place], [place] [address]   The format for your next appointment:   In Person  Provider:   Bridgette Christopher, MD   

## 2021-03-16 NOTE — Progress Notes (Deleted)
Date:  03/16/2021   ID:  Shannon [name], DOB [birth year], MRN [id number]  PCP:  Donalynn Furlong, MD  Cardiologist:  Buford Dresser, MD  Referring MD: Donalynn Furlong, MD   Cc: follow up  History of Present Illness:    Shannon [name] is a 75 y.o. female with a hx of hypertension, CVA who is seen for follow up today. She was seen in the ER on 09/30/19 for concern for stroke, elevated blood pressure. 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  Today: Reviewed phone call from 01/30/21. Had noted blood pressure running low on metoprolol, with readings of 116/55 and 105/58. HR had been 68-78. Felt fatigued but no other high risk symptoms. Changed from diltiazem to metoprolol after that call.   Continues to have fatigue. On diltiazem was having dizzy spells, worst on 2/9. Changed to metoprolol 2/11, but had another dizzy spell 2/15. Now splits the metoprolol dose, some improvement.   Now notes chest heaviness intermittently in the AM. Takes her meds, sits down for bible study. About 10 minutes in, notes heaviness that distracts her, feels some lightheadedness with it as well. Happens about 2-3 times/week. Does have good days where she feels great, but this alternates with severe fatigue where she feels like she can't do anything. Is able to walk about a mile, no symptoms with exertion, but feels very tired. Feels that incline, hot weather are the most difficult for her.   On days where her blood pressure is higher, feels the pounding in her head. Blood pressure remains labile, symptomatic with both high and (low for her) low numbers.  Rare racing heartbeats, had an episode yesterday AM. Took vitals at the time, 118/55, HR 68. At bedtime, BP was 154/70. HR 68. Had been able to walk her mile, do housework.  Sleeping less on metoprolol than she was on diltiazem. Didn't sleep last night. Woke up with a headache. Feeling  stressed this AM, readings always higher in the office.   Reviewed BP log since 2/19 (when she started the 1/2 metoprolol BID). Range 110s-160s/70s-80s, HR in the 60s. 160s are rare, usually [id number] systolic.  Has followed her thyroid with her PCP. Wondering if she needs her B12 checked. Deferred to her PCP on this. No macrocytic anemia that I see.   We discussed options for evaluation of chest heaviness. She was trialed on a treadmill in the past, couldn't get her heart rate to target at that time. Didn't covert to [place]. On [facility], reviewed ETT 2019 at [facility], see below. Discussed options for further evaluation today.  Today:  Past Medical History:  Diagnosis Date  . Anxiety   . 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 Arthritis   . Cancer (HCC)    skin - squamous cell  . Complication of anesthesia    " I had difficulty coming out of it"  . Dyspnea    with exertion  . Family history of adverse reaction to anesthesia    " My daughter has a problem coming ou to it"  . GERD (gastroesophageal reflux disease)   . Headache    migraines in the past  . Hypertension   . Hypothyroidism   . Stroke ([place]) 11/2018   some weakness on right side   . SVT (supraventricular tachycardia) ([place])   . Thyroid disease     Past Surgical History:  Procedure Laterality Date  . ABDOMINAL HYSTERECTOMY    .  APPENDECTOMY    . CARDIAC CATHETERIZATION    . CARPAL TUNNEL RELEASE Bilateral   . COLONOSCOPY    . DILATION AND CURETTAGE OF UTERUS    . IR ANGIO INTRA EXTRACRAN SEL COM CAROTID INNOMINATE BILAT MOD SED  12/25/2018  . IR ANGIO INTRA EXTRACRAN SEL COM CAROTID INNOMINATE BILAT MOD SED  10/30/2019  . IR ANGIO INTRA EXTRACRAN SEL COM CAROTID INNOMINATE BILAT MOD SED  09/23/2020  . IR ANGIO VERTEBRAL SEL SUBCLAVIAN INNOMINATE UNI L MOD SED  12/25/2018  . IR ANGIO VERTEBRAL SEL SUBCLAVIAN INNOMINATE UNI L MOD SED  10/30/2019  . IR ANGIO VERTEBRAL SEL SUBCLAVIAN INNOMINATE UNI L MOD SED   09/23/2020  . IR ANGIO VERTEBRAL SEL SUBCLAVIAN INNOMINATE UNI R MOD SED  01/04/2019  . IR ANGIO VERTEBRAL SEL VERTEBRAL UNI R MOD SED  12/25/2018  . IR ANGIO VERTEBRAL SEL VERTEBRAL UNI R MOD SED  10/30/2019  . IR ANGIO VERTEBRAL SEL VERTEBRAL UNI R MOD SED  09/23/2020  . IR CT HEAD LTD  01/04/2019  . IR CT HEAD LTD  11/21/2019  . IR INTRA CRAN STENT  01/04/2019  . IR INTRA CRAN STENT  11/21/2019  . IR RADIOLOGIST EVAL & MGMT  03/12/2021  . IR TRANSCATH EXCRAN VERT OR CAR A STENT  11/17/2020  . IR US GUIDE VASC ACCESS RIGHT  12/25/2018  . IR US GUIDE VASC ACCESS RIGHT  11/02/2019  . OVARIAN CYST REMOVAL    . RADIOLOGY WITH ANESTHESIA N/A 01/04/2019   Procedure: RMCA angioplasty with possible stenting;  Surgeon: Luanne Bras, MD;  Location: [place];  Service: Radiology;  Laterality: N/A;  . RADIOLOGY WITH ANESTHESIA N/A 11/21/2019   Procedure: IR WITH ANESTHESIA/ STENT PLACEMENT;  Surgeon: Luanne Bras, MD;  Location: [place];  Service: Radiology;  Laterality: N/A;  . RADIOLOGY WITH ANESTHESIA N/A 10/01/2020   Procedure: IR WITH ANESTHESIA ANGIOPLASTY WITH POSSIBLE STENTING;  Surgeon: Luanne Bras, MD;  Location: [place];  Service: Radiology;  Laterality: N/A;  . RADIOLOGY WITH ANESTHESIA N/A 11/17/2020   Procedure: IR WITH ANESTHESIA STENT PLACEMENT;  Surgeon: Luanne Bras, MD;  Location: [place];  Service: Radiology;  Laterality: N/A;    Current Medications: Current Outpatient Medications on File Prior to Visit  Medication Sig  . acetaminophen (TYLENOL) 325 MG tablet Take 325 mg by mouth every 6 (six) hours as needed for headache.   Marland Kitchen aspirin EC 81 MG EC tablet Take 1 tablet (81 mg total) by mouth daily.  Marland Kitchen atorvastatin (LIPITOR) 40 MG tablet Take 1 tablet (40 mg total) by mouth daily at 6 PM.  . busPIRone (BUSPAR) 5 MG tablet Take 5 mg by mouth daily as needed (anxiety).   . Calcium Carb-Cholecalciferol (CALCIUM 600 + D PO) Take 0.5-1 tablets by mouth See admin instructions.  Take 1 tablet after breakfast, 1 tablet after lunch, and 0.5 tablet after dinner  . Carboxymethylcellulose Sodium (ARTIFICIAL TEARS OP) Place 1 drop into both eyes 3 (three) times daily as needed (dry eyes).   . Coenzyme Q10 50 MG CAPS Take 50 mg by mouth daily.   Marland Kitchen levothyroxine (SYNTHROID) 75 MCG tablet Take 75 mcg by mouth every Tuesday, Thursday, Saturday, and Sunday.   . levothyroxine (SYNTHROID, LEVOTHROID) 50 MCG tablet Take 50 mcg by mouth every Monday, Wednesday, and Friday.   . loratadine (CLARITIN) 10 MG tablet Take 10 mg by mouth daily.  . metoprolol succinate (TOPROL-XL) 25 MG 24 hr tablet Take 1 tablet (25 mg total) by mouth  daily. Take with or immediately following a meal. (Patient taking differently: Take 12.5 mg by mouth in the morning and at bedtime. Take with or immediately following a meal.)  . Multiple Vitamin (MULTIVITAMIN) capsule Take 1 capsule by mouth daily. Senior  . ticagrelor (BRILINTA) 60 MG TABS tablet Take 30 mg by mouth 2 (two) times daily.   . vitamin E 400 UNIT capsule Take 400 Units by mouth every other day.    No current facility-administered medications on file prior to visit.     Allergies:   Elastic bandages & [zinc], Sulfa antibiotics, Tape, Mandol [cefamandole], and Sinutab non-drowsy max [pseudoephedrine-acetaminophen]   Social History   Tobacco Use  . Smoking status: Never Smoker  . Smokeless tobacco: Never Used  Vaping Use  . Vaping Use: Never used  Substance Use Topics  . Alcohol use: Never  . Drug use: Never    Family History: family history includes CVA in her maternal aunt and mother; Heart disease in her mother; Peripheral Artery Disease in her father.  ROS:   Please see the history of present illness.  Additional pertinent ROS otherwise unremarkable.  EKGs/Labs/Other Studies Reviewed:    The following studies were reviewed today: Monitor 07/31/20 7 days of data recorded on Zio monitor. Patient had a min HR of 50 bpm, max HR of  158 bpm, and avg HR of 85 bpm. Predominant underlying rhythm was Sinus Rhythm. No VT, atrial fibrillation, high degree block, or pauses noted. Isolated atrial and ventricular ectopy was rare (<1%). 524 Supraventricular Tachycardia runs occurred, the run with the fastest interval lasting 6 beats with a max rate of 158 bpm, the longest lasting 19 beats with an avg rate of 104 bpm.   Echo 12/09/2018 - Left ventricle: The cavity size was normal. Systolic function was   normal. The estimated ejection fraction was in the range of 60%   to 65%. Wall motion was normal; there were no regional wall   motion abnormalities. Left ventricular diastolic function   parameters were normal. - Left atrium: The atrium was normal in size. - Right ventricle: Systolic function was normal. - Atrial septum: No defect or patent foramen ovale was identified.   Echo contrast study showed no right-to-left atrial level shunt,   at baseline or with provocation. - Pulmonary arteries: Systolic pressure was within the normal   range.  ETT 09/04/2018 ([facility]) Baseline HR bpm 70   Baseline BP mmHg 186/83   Peak HR bpm 121   PMHR % % 82   Peak BP mmHg 213/80   Exercise duration (min) min 9   Exercise duration (sec) sec 0   Stage Reached  3   Estimated workload METS 10.1   ST Deviation mm 0.0   D[facility] Treadmill Score  5.0   Angina Score for [facility] Treadmill Score  1    Narrative    Minor non-specific ST and T wave changes noted during stress or recovery   Overall, the patient's exercise capacity was excellent.   Negative ETT for ischemia at workload achieved   The patient experienced atypical chest pain during the stress test.   No significant arrhythmia noted   Reassurance.  BP control important.    EKG:  EKG is personally reviewed.  The ekg ordered 04/09/20 demonstrates NSR with sinus arrhythmia.  Recent Labs: 11/18/2020: BUN 8; Creatinine, Ser 0.53; Hemoglobin 12.3; Platelets 184; Potassium 3.7; Sodium 135   Recent Lipid Panel    Component Value Date/Time   CHOL 199 12/09/2018  0352   TRIG 110 12/09/2018 0352   HDL 45 12/09/2018 0352   CHOLHDL 4.4 12/09/2018 0352   VLDL 22 12/09/2018 0352   LDLCALC 132 (H) 12/09/2018 0352    Physical Exam:    VS:  There were no vitals taken for this visit.    Wt Readings from Last 3 Encounters:  02/18/21 118 lb (53.5 kg)  01/14/21 117 lb 3.2 oz (53.2 kg)  11/17/20 117 lb (53.1 kg)    GEN: Well nourished, well developed in no acute distress HEENT: Normal, moist mucous membranes NECK: No JVD CARDIAC: regular rhythm, normal S1 and S2, no rubs or gallops. No murmur. VASCULAR: Radial and DP pulses 2+ bilaterally. No carotid bruits RESPIRATORY:  Clear to auscultation without rales, wheezing or rhonchi  ABDOMEN: Soft, non-tender, non-distended MUSCULOSKELETAL:  Ambulates independently SKIN: Warm and dry, no edema NEUROLOGIC:  Alert and oriented x 3. No focal neuro deficits noted. PSYCHIATRIC:  Normal affect   ASSESSMENT:    No diagnosis found. PLAN:    Palpitations, tachycardia: -reviewed monitor results. Paroxysmal SVT -see medication adjustment below  Hypertension: labile readings -typically higher in office, use home numbers for medication adjustments -was running symptomatically low at home, better with cessation of lisinopril -tolerated amlodipine 2.5 mg daily. However, given paroxysmal SVT, discussed change to have heart rate control. Discussed diltiazem/verapamil vs. Beta blockers. Discussed pros/cons. After shared decision making, will stop amlodipine and start low dose diltiazem -instructed on red flag warning signs that need immediate medical attention  History of CVA, with need for secondary prevention: -on aspirin, ticagrelor. Ticagrelor duration per interventional neurology -on high intensity atorvastatin -echo 12/09/18 was normal, negative bubble study  Cardiac risk counseling and prevention recommendations: -recommend heart  healthy/Mediterranean diet, with whole grains, fruits, vegetable, fish, lean meats, nuts, and olive oil. Limit salt. -recommend moderate walking, 3-5 times/week for 30-50 minutes each session. Aim for at least 150 minutes.week. Goal should be pace of 3 miles/hours, or walking 1.5 miles in 30 minutes -recommend avoidance of tobacco products. Avoid excess alcohol.  Plan for follow up: keep previously scheduled appt in March to monitor response to medication change  Medication Adjustments/Labs and Tests Ordered: Current medicines are reviewed at length with the patient today.  Concerns regarding medicines are outlined above.  No orders of the defined types were placed in this encounter.  No orders of the defined types were placed in this encounter.   There are no Patient Instructions on file for this visit.  Signed, Buford Dresser, MD PhD 03/16/2021 12:54 PM    [place]

## 2021-03-17 ENCOUNTER — Other Ambulatory Visit (HOSPITAL_COMMUNITY): Payer: Self-pay | Admitting: Interventional Radiology

## 2021-03-17 DIAGNOSIS — G450 Vertebro-basilar artery syndrome: Secondary | ICD-10-CM

## 2021-03-17 DIAGNOSIS — I771 Stricture of artery: Secondary | ICD-10-CM

## 2021-03-24 ENCOUNTER — Ambulatory Visit (HOSPITAL_COMMUNITY)
Admission: RE | Admit: 2021-03-24 | Discharge: 2021-03-24 | Disposition: A | Discharge status: Home / Self Care | Payer: Medicare Other | Source: Ambulatory Visit | Attending: Interventional Radiology | Admitting: Interventional Radiology

## 2021-03-24 ENCOUNTER — Other Ambulatory Visit: Payer: Self-pay

## 2021-03-24 DIAGNOSIS — I771 Stricture of artery: Secondary | ICD-10-CM | POA: Insufficient documentation

## 2021-03-24 DIAGNOSIS — G450 Vertebro-basilar artery syndrome: Secondary | ICD-10-CM | POA: Diagnosis present

## 2021-03-24 LAB — POCT I-STAT CREATININE: Creatinine, Ser: 0.7 mg/dL (ref 0.44–1.00)

## 2021-03-24 MED ORDER — IOHEXOL 350 MG/ML SOLN
80.0000 mL | Freq: Once | INTRAVENOUS | Status: AC | PRN
  Administered 2021-03-24: 80 mL via INTRAVENOUS

## 2021-03-30 ENCOUNTER — Telehealth (HOSPITAL_COMMUNITY): Payer: Self-pay

## 2021-03-30 NOTE — Telephone Encounter (Signed)
Pt agreed to f/u in 6 months with cta head/neck. AW 

## 2021-04-14 ENCOUNTER — Ambulatory Visit: Payer: Medicare Other | Admitting: Adult Health

## 2021-04-30 ENCOUNTER — Ambulatory Visit: Payer: Medicare Other | Admitting: Adult Health

## 2021-05-15 ENCOUNTER — Telehealth: Payer: Self-pay | Admitting: Cardiology

## 2021-05-15 NOTE — Telephone Encounter (Signed)
New message:    Patient calling to see if she can have apt be VV.

## 2021-05-15 NOTE — Telephone Encounter (Signed)
Spoke to patient . Patient states she will be out of town that  Day. Okay for [employer].  instruction given for virtual visit. Schedule changed

## 2021-06-12 NOTE — Progress Notes (Signed)
Virtual Visit via Telephone Note   This visit type was conducted due to national recommendations for restrictions regarding the COVID-19 Pandemic (e.g. social distancing) in an effort to limit this patient's exposure and mitigate transmission in our community.  Due to her co-morbid illnesses, this patient is at least at moderate risk for complications without adequate follow up.  This format is felt to be most appropriate for this patient at this time.  The patient did not have access to video technology/had technical difficulties with video requiring transitioning to audio format only (telephone).  All issues noted in this document were discussed and addressed.  No physical exam could be performed with this format.  Please refer to the patient's chart for her  consent to telehealth for [facility].   The patient was identified using 2 identifiers.  Patient Location: Home Provider Location: Office/Clinic   Date:  06/16/2021   ID:  Shannon [name], DOB 02-01-1946, MRN [id number]  PCP:  Donalynn Furlong, MD  Cardiologist:  Buford Dresser, MD  Referring MD: Donalynn Furlong, MD   CC: follow up  History of Present Illness:    Shannon [name] is a 75 y.o. female with a hx of hypertension, CVA who is seen for follow up today. She was seen in the ER on 09/30/19 for concern for stroke, elevated blood pressure. 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  Today: Doing well since cataract surgery in June[place] get more exercise as it is cooler. Notes that her BP is lower in the mountains. 104/54, pulse 74 after walking two days ago, rechecked 116/74 later in the day (did not take BP meds that day). 108/61 after walking yesterday, did not take BP meds yesterday either. Only BP medication is metoprolol succinate 12.5 mg BID. Highest BP 267 systolic, lowest 95 systolic. Was 174/77 during her  cataract procedure.   She denies any shortness of breath, palpitations, or exertional symptoms. No headaches, lightheadedness, or syncope to report. Also has no lower extremity edema, orthopnea or PND. No chest pain with exertion, but feels her heart beating more when she lays in bed. Dizzy spells rare but not significantly different since surgery.  She is asking about evaluation of her coronaries, as her brother recently had a stress test. We reviewed her ETT and alternative options, including nuclear lexiscan, CCTA, and cath. See below.  The patient does not have symptoms concerning for COVID-19 infection (fever, chills, cough, or new shortness of breath).    Past Medical History:  Diagnosis Date   Anxiety    Artery stenosis ([place])    cerebral   Arthritis    Cancer ([place])    skin - squamous cell   Complication of anesthesia    " I had difficulty coming out of it"   Dyspnea    with exertion   Family history of adverse reaction to anesthesia    " My daughter has a problem coming ou to it"   GERD (gastroesophageal reflux disease)    Headache    migraines in the past   Hypertension    Hypothyroidism    Stroke ([place]) 11/2018   some weakness on right side    SVT (supraventricular tachycardia) ([place])    Thyroid disease     Past Surgical History:  Procedure Laterality Date   ABDOMINAL HYSTERECTOMY     APPENDECTOMY     CARDIAC CATHETERIZATION  CARPAL TUNNEL RELEASE Bilateral    COLONOSCOPY     DILATION AND CURETTAGE OF UTERUS     IR ANGIO INTRA EXTRACRAN SEL COM CAROTID INNOMINATE BILAT MOD SED  12/25/2018   IR ANGIO INTRA EXTRACRAN SEL COM CAROTID INNOMINATE BILAT MOD SED  10/30/2019   IR ANGIO INTRA EXTRACRAN SEL COM CAROTID INNOMINATE BILAT MOD SED  09/23/2020   IR ANGIO VERTEBRAL SEL SUBCLAVIAN INNOMINATE UNI L MOD SED  12/25/2018   IR ANGIO VERTEBRAL SEL SUBCLAVIAN INNOMINATE UNI L MOD SED  10/30/2019   IR ANGIO VERTEBRAL SEL SUBCLAVIAN INNOMINATE UNI L MOD SED  09/23/2020    IR ANGIO VERTEBRAL SEL SUBCLAVIAN INNOMINATE UNI R MOD SED  01/04/2019   IR ANGIO VERTEBRAL SEL VERTEBRAL UNI R MOD SED  12/25/2018   IR ANGIO VERTEBRAL SEL VERTEBRAL UNI R MOD SED  10/30/2019   IR ANGIO VERTEBRAL SEL VERTEBRAL UNI R MOD SED  09/23/2020   IR CT HEAD LTD  01/04/2019   IR CT HEAD LTD  11/21/2019   IR INTRA CRAN STENT  01/04/2019   IR INTRA CRAN STENT  11/21/2019   IR RADIOLOGIST EVAL & MGMT  03/12/2021   IR TRANSCATH EXCRAN VERT OR CAR A STENT  11/17/2020   IR US GUIDE VASC ACCESS RIGHT  12/25/2018   IR US GUIDE VASC ACCESS RIGHT  11/02/2019   OVARIAN CYST REMOVAL     RADIOLOGY WITH ANESTHESIA N/A 01/04/2019   Procedure: RMCA angioplasty with possible stenting;  Surgeon: Luanne Bras, MD;  Location: [place];  Service: Radiology;  Laterality: N/A;   RADIOLOGY WITH ANESTHESIA N/A 11/21/2019   Procedure: IR WITH ANESTHESIA/ STENT PLACEMENT;  Surgeon: Luanne Bras, MD;  Location: [place];  Service: Radiology;  Laterality: N/A;   RADIOLOGY WITH ANESTHESIA N/A 10/01/2020   Procedure: IR WITH ANESTHESIA ANGIOPLASTY WITH POSSIBLE STENTING;  Surgeon: Luanne Bras, MD;  Location: [place];  Service: Radiology;  Laterality: N/A;   RADIOLOGY WITH ANESTHESIA N/A 11/17/2020   Procedure: IR WITH ANESTHESIA STENT PLACEMENT;  Surgeon: Luanne Bras, MD;  Location: [place];  Service: Radiology;  Laterality: N/A;    Current Medications: Current Outpatient Medications on File Prior to Visit  Medication Sig   acetaminophen (TYLENOL) 325 MG tablet Take 325 mg by mouth every 6 (six) hours as needed for headache.    aspirin EC 81 MG EC tablet Take 1 tablet (81 mg total) by mouth daily.   atorvastatin (LIPITOR) 40 MG tablet Take 1 tablet (40 mg total) by mouth daily at 6 PM.   busPIRone (BUSPAR) 5 MG tablet Take 5 mg by mouth daily as needed (anxiety).    Calcium Carb-Cholecalciferol (CALCIUM 600 + D PO) Take 1,500 mg by mouth See admin instructions. Take 1 tablet after breakfast, 1 tablet  after lunch, and 0.5 tablet after dinner   Carboxymethylcellulose Sodium (ARTIFICIAL TEARS OP) Place 1 drop into both eyes 3 (three) times daily as needed (dry eyes).    Coenzyme Q10 50 MG CAPS Take 50 mg by mouth daily.    levothyroxine (SYNTHROID) 75 MCG tablet Take 75 mcg by mouth every Tuesday, Thursday, Saturday, and Sunday.    levothyroxine (SYNTHROID, LEVOTHROID) 50 MCG tablet Take 50 mcg by mouth every Monday, Wednesday, and Friday.    loratadine (CLARITIN) 10 MG tablet Take 10 mg by mouth daily.   metoprolol succinate (TOPROL-XL) 25 MG 24 hr tablet Take 12.5 mg by mouth in the morning and at bedtime.   Multiple Vitamin (MULTIVITAMIN) capsule Take 1  capsule by mouth daily. Senior   ticagrelor (BRILINTA) 60 MG TABS tablet Take 30 mg by mouth 2 (two) times daily.    vitamin E 400 UNIT capsule Take 400 Units by mouth every other day.    No current facility-administered medications on file prior to visit.     Allergies:   Elastic bandages & [zinc], Sulfa antibiotics, Tape, Mandol [cefamandole], and Sinutab non-drowsy max [pseudoephedrine-acetaminophen]   Social History   Tobacco Use   Smoking status: Never   Smokeless tobacco: Never  Vaping Use   Vaping Use: Never used  Substance Use Topics   Alcohol use: Never   Drug use: Never    Family History: family history includes CVA in her maternal aunt and mother; Heart disease in her mother; Peripheral Artery Disease in her father.  ROS:   Please see the history of present illness.   Additional pertinent ROS otherwise unremarkable.  EKGs/Labs/Other Studies Reviewed:    The following studies were reviewed today: ETT 2021-03-24: Blood pressure demonstrated a normal response to exercise. Upsloping ST segment depression ST segment depression of 1 mm was noted during stress in the V5, V6, III and aVF leads.   ETT with fair exercise tolerance (6:19); no chest pain; normal blood pressure response; study terminated secondary to dyspnea;  1 mm of upsloping to horizontal ST depression in the inferior lateral leads of borderline significance; patient achieved 74% (inadequate) predicted maximum heart rate; suggest alternative study if clinical suspicion of ischemia high (nuclear study or cardiac CTA).   Monitor 07/31/20 7 days of data recorded on Zio monitor. Patient had a min HR of 50 bpm, max HR of 158 bpm, and avg HR of 85 bpm. Predominant underlying rhythm was Sinus Rhythm. No VT, atrial fibrillation, high degree block, or pauses noted. Isolated atrial and ventricular ectopy was rare (<1%). 524 Supraventricular Tachycardia runs occurred, the run with the fastest interval lasting 6 beats with a max rate of 158 bpm, the longest lasting 19 beats with an avg rate of 104 bpm.   Echo 12/09/2018 - Left ventricle: The cavity size was normal. Systolic function was   normal. The estimated ejection fraction was in the range of 60%   to 65%. Wall motion was normal; there were no regional wall   motion abnormalities. Left ventricular diastolic function   parameters were normal. - Left atrium: The atrium was normal in size. - Right ventricle: Systolic function was normal. - Atrial septum: No defect or patent foramen ovale was identified.   Echo contrast study showed no right-to-left atrial level shunt,   at baseline or with provocation. - Pulmonary arteries: Systolic pressure was within the normal   range.  ETT 09/04/2018 ([facility]) Baseline HR bpm 70   Baseline BP mmHg 186/83   Peak HR bpm 121   PMHR % % 82   Peak BP mmHg 213/80   Exercise duration (min) min 9   Exercise duration (sec) sec 0   Stage Reached  3   Estimated workload METS 10.1   ST Deviation mm 0.0   Duke Treadmill Score  5.0   Angina Score for Duke Treadmill Score  1    Narrative    Minor non-specific ST and T wave changes noted during stress or recovery   Overall, the patient's exercise capacity was excellent.   Negative ETT for ischemia at workload achieved    The patient experienced atypical chest pain during the stress test.   No significant arrhythmia noted   Reassurance.  BP control important.   Cardiac cath 04/2012 [facility]) Per note 07/17/2015  Carotid doppler 08/16/2014  Normal Duplex Doppler of Carotids   Cardiovascular stress test 04/2012  NL LVEF; inferior ischemia   Cardiac catheterization 04/2012  Nonobstructive 30% RCA disease otherwise NL and EF 65%  EKG:  Personally reviewed today 4/22- EKG was not ordered today 04/09/20- NSR with sinus arrhythmia  Recent Labs: 11/18/2020: BUN 8; Hemoglobin 12.3; Platelets 184; Potassium 3.7; Sodium 135 03/24/2021: Creatinine, Ser 0.70  Recent Lipid Panel    Component Value Date/Time   CHOL 199 12/09/2018 0352   TRIG 110 12/09/2018 0352   HDL 45 12/09/2018 0352   CHOLHDL 4.4 12/09/2018 0352   VLDL 22 12/09/2018 0352   LDLCALC 132 (H) 12/09/2018 0352    Physical Exam:    VS:  BP 127/73   Pulse 74   Ht 5\' 5"  (1.651 m)   Wt 116 lb (52.6 kg)   BMI 19.30 kg/m     Wt Readings from Last 3 Encounters:  06/16/21 116 lb (52.6 kg)  03/16/21 118 lb (53.5 kg)  02/18/21 118 lb (53.5 kg)   Speaking comfortably on the phone, no audible wheezing In no acute distress Alert and oriented Normal affect Normal speech    ASSESSMENT:    1. History of CVA (cerebrovascular accident)   2. Essential hypertension   3. PSVT (paroxysmal supraventricular tachycardia) (HCC)   4. Cardiac risk counseling   5. Counseling on health promotion and disease prevention     PLAN:    Atypical chest pain, fatigue: chest discomfort largely resolved, except that she can feel her heart more at night -reviewed ETT. Nonspecific changes but nondiagnostic -we discussed options for further evaluation, including risk/benefit of myoview or CT coronary. We also discussed coronary angiography. -she does not ever want lexiscan myoview, and given stroke history I agree with trying to avoid this. We discussed pros/cons and  limitations of CT coronary. Also discussed risk/benefit of cath. I cannot see her prior cath, but only noted to have 30% RCA lesion at that time. -after reviewing options, we will continue to monitor symptoms at this time. If symptoms progress, will then discuss CT vs cath. -cautioned on red flag warning signs that need immediate medical attention  Palpitations, tachycardia: -monitor shows paroxysmal SVT  Hypertension: labile readings -typically higher in office, use home numbers for medication adjustments -was running symptomatically low at home, better with cessation of lisinopril and amlodipine -did not tolerate diltiazem for pSVT, changed to metoprolol -her blood pressure [place], perhaps due to decreased stress and more exercise. Gave instructions on metoprolol. If BP normal to elevated for her, would continue to take 12.5 mg metoprolol succinate BID. If borderline, would take only PM dose. Would hold for systolics <409   History of CVA, with need for secondary prevention: -on aspirin, ticagrelor. Ticagrelor duration per interventional neurology -on high intensity atorvastatin -echo 12/09/18 was normal, negative bubble study  Cardiac risk counseling and prevention recommendations: -recommend heart healthy/Mediterranean diet, with whole grains, fruits, vegetable, fish, lean meats, nuts, and olive oil. Limit salt. -recommend moderate walking, 3-5 times/week for 30-50 minutes each session. Aim for at least 150 minutes.week. Goal should be pace of 3 miles/hours, or walking 1.5 miles in 30 minutes -recommend avoidance of tobacco products. Avoid excess alcohol.  Time:   Today, I have spent 27 minutes with the patient with telehealth technology discussing the above problems.    Plan for follow up: 6 months  or sooner as needed  Medication Adjustments/Labs and Tests Ordered: Current medicines are reviewed at length with the patient today.  Concerns regarding medicines are  outlined above.  No orders of the defined types were placed in this encounter.  No orders of the defined types were placed in this encounter.   Patient Instructions  Medication Instructions:  Per Dr.Janae Bonser: I would continue to take the 12.5 mg metoprolol in the evenings unless your blood pressure is very low (<100 on the top number). You can take the morning dose of metoprolol if your top number is >110.  *If you need a refill on your cardiac medications before your next appointment, please call your pharmacy*  Follow-Up: At [facility], you and your health needs are our priority.  As part of our continuing mission to provide you with exceptional heart care, we have created designated Provider Care Teams.  These Care Teams include your primary Cardiologist (physician) and Advanced Practice Providers (APPs -  Physician Assistants and Nurse Practitioners) who all work together to provide you with the care you need, when you need it.  We recommend signing up for the patient portal called "MyChart".  Sign up information is provided on this After Visit Summary.  MyChart is used to connect with patients for Virtual Visits (Telemedicine).  Patients are able to view lab/test results, encounter notes, upcoming appointments, etc.  Non-urgent messages can be sent to your provider as well.   To learn more about what you can do with MyChart, go to [email or web address].    Your next appointment:   6 month(s)  The format for your next appointment:   In Person  Provider:   Buford Dresser, MD       Signed, Buford Dresser, MD PhD 06/16/2021    Shannon [name]

## 2021-06-14 ENCOUNTER — Encounter: Payer: Self-pay | Admitting: Cardiology

## 2021-06-16 ENCOUNTER — Encounter (HOSPITAL_BASED_OUTPATIENT_CLINIC_OR_DEPARTMENT_OTHER): Payer: Self-pay | Admitting: Cardiology

## 2021-06-16 ENCOUNTER — Telehealth (INDEPENDENT_AMBULATORY_CARE_PROVIDER_SITE_OTHER): Payer: Medicare Other | Admitting: Cardiology

## 2021-06-16 VITALS — BP 127/73 | HR 74 | Ht 65.0 in | Wt 116.0 lb

## 2021-06-16 DIAGNOSIS — I471 Supraventricular tachycardia, unspecified: Secondary | ICD-10-CM

## 2021-06-16 DIAGNOSIS — Z7189 Other specified counseling: Secondary | ICD-10-CM | POA: Diagnosis not present

## 2021-06-16 DIAGNOSIS — I10 Essential (primary) hypertension: Secondary | ICD-10-CM | POA: Diagnosis not present

## 2021-06-16 DIAGNOSIS — Z8673 Personal history of transient ischemic attack (TIA), and cerebral infarction without residual deficits: Secondary | ICD-10-CM | POA: Diagnosis not present

## 2021-06-16 NOTE — Patient Instructions (Addendum)
Medication Instructions:  Per Dr.Christopher: I would continue to take the 12.5 mg metoprolol in the evenings unless your blood pressure is very low (<100 on the top number). You can take the morning dose of metoprolol if your top number is >110.  *If you need a refill on your cardiac medications before your next appointment, please call your pharmacy*  Follow-Up: At [facility], you and your health needs are our priority.  As part of our continuing mission to provide you with exceptional heart care, we have created designated Provider Care Teams.  These Care Teams include your primary Cardiologist (physician) and Advanced Practice Providers (APPs -  Physician Assistants and Nurse Practitioners) who all work together to provide you with the care you need, when you need it.  We recommend signing up for the patient portal called "MyChart".  Sign up information is provided on this After Visit Summary.  MyChart is used to connect with patients for Virtual Visits (Telemedicine).  Patients are able to view lab/test results, encounter notes, upcoming appointments, etc.  Non-urgent messages can be sent to your provider as well.   To learn more about what you can do with MyChart, go to [email or web address].    Your next appointment:   6 month(s)  The format for your next appointment:   In Person  Provider:   Buford Dresser, MD

## 2021-06-26 ENCOUNTER — Other Ambulatory Visit (HOSPITAL_COMMUNITY): Payer: Self-pay | Admitting: Student

## 2021-06-26 MED ORDER — TICAGRELOR 60 MG PO TABS: 30.0000 mg | ORAL_TABLET | Freq: Two times a day (BID) | ORAL | 0 refills | Status: DC

## 2021-09-07 ENCOUNTER — Other Ambulatory Visit (HOSPITAL_COMMUNITY): Payer: Self-pay | Admitting: Interventional Radiology

## 2021-09-07 DIAGNOSIS — I771 Stricture of artery: Secondary | ICD-10-CM

## 2021-09-07 DIAGNOSIS — R42 Dizziness and giddiness: Secondary | ICD-10-CM

## 2021-09-08 ENCOUNTER — Encounter (HOSPITAL_COMMUNITY): Payer: Self-pay

## 2021-09-08 ENCOUNTER — Ambulatory Visit (HOSPITAL_COMMUNITY)
Admission: RE | Admit: 2021-09-08 | Discharge: 2021-09-08 | Disposition: A | Discharge status: Home / Self Care | Payer: Medicare Other | Source: Ambulatory Visit | Attending: Interventional Radiology | Admitting: Interventional Radiology

## 2021-09-08 ENCOUNTER — Other Ambulatory Visit (HOSPITAL_COMMUNITY): Payer: Self-pay | Admitting: Interventional Radiology

## 2021-09-08 DIAGNOSIS — I771 Stricture of artery: Secondary | ICD-10-CM

## 2021-09-08 DIAGNOSIS — R42 Dizziness and giddiness: Secondary | ICD-10-CM

## 2021-09-08 LAB — POCT I-STAT CREATININE: Creatinine, Ser: 0.7 mg/dL (ref 0.44–1.00)

## 2021-09-08 MED ORDER — IOHEXOL 350 MG/ML SOLN
80.0000 mL | Freq: Once | INTRAVENOUS | Status: AC | PRN
  Administered 2021-09-08: 80 mL via INTRAVENOUS

## 2021-09-09 ENCOUNTER — Other Ambulatory Visit: Payer: Self-pay

## 2021-09-09 ENCOUNTER — Ambulatory Visit (HOSPITAL_COMMUNITY)
Admission: RE | Admit: 2021-09-09 | Discharge: 2021-09-09 | Disposition: A | Discharge status: Home / Self Care | Payer: Medicare Other | Source: Ambulatory Visit | Attending: Interventional Radiology | Admitting: Interventional Radiology

## 2021-09-09 DIAGNOSIS — R42 Dizziness and giddiness: Secondary | ICD-10-CM

## 2021-09-09 DIAGNOSIS — I771 Stricture of artery: Secondary | ICD-10-CM

## 2021-09-10 HISTORY — PX: IR RADIOLOGIST EVAL & MGMT: IMG5224

## 2021-09-15 ENCOUNTER — Other Ambulatory Visit (HOSPITAL_COMMUNITY): Payer: Self-pay | Admitting: Interventional Radiology

## 2021-09-15 DIAGNOSIS — I771 Stricture of artery: Secondary | ICD-10-CM

## 2021-09-16 ENCOUNTER — Other Ambulatory Visit: Payer: Self-pay

## 2021-09-16 ENCOUNTER — Telehealth (HOSPITAL_COMMUNITY): Payer: Self-pay

## 2021-09-16 ENCOUNTER — Ambulatory Visit (HOSPITAL_COMMUNITY)
Admission: RE | Admit: 2021-09-16 | Discharge: 2021-09-16 | Disposition: A | Discharge status: Home / Self Care | Payer: Medicare Other | Source: Ambulatory Visit | Attending: Interventional Radiology | Admitting: Interventional Radiology

## 2021-09-16 DIAGNOSIS — I771 Stricture of artery: Secondary | ICD-10-CM

## 2021-09-16 NOTE — Telephone Encounter (Signed)
Pt agreed to f/u in 6 months with cta head/neck. AW 

## 2021-09-25 ENCOUNTER — Other Ambulatory Visit: Payer: Self-pay | Admitting: Physician Assistant

## 2021-09-25 MED ORDER — TICAGRELOR 60 MG PO TABS: 30.0000 mg | ORAL_TABLET | Freq: Two times a day (BID) | ORAL | 0 refills | Status: DC

## 2021-10-12 ENCOUNTER — Telehealth (HOSPITAL_BASED_OUTPATIENT_CLINIC_OR_DEPARTMENT_OTHER): Payer: Self-pay | Admitting: Cardiology

## 2021-10-12 NOTE — Telephone Encounter (Signed)
Metoprolol succ could be cut back to 12.5 mg qd to increase HR some, but risk increasing pSVT.  Not sure which is preferable.  Will defer to Dr. Harrell Gave

## 2021-10-12 NOTE — Telephone Encounter (Signed)
Pt calling this morning regarding lower heart rate that is causing her to feel short of breath and like her chest is heavy. Pt states that when her heart rate is in the 70s or higher she feels much better. This morning around 7-7:15am pt's blood pressure was 135/67 and HR was 55. Pt states that this was before her morning medication and she took all of her medication as prescribed this morning. Yesterday blood pressure after medication was 126/58 HR 53, pt was getting ready for church and had to stop multiple times to catch her breath. Pt states that on Saturday morning blood pressure was 129/60 HR 73 and she felt good, however later in the day around 2:15pm she took her blood pressure again 123/92 HR 62 and she started to feel run down, SOB and that heaviness that she describes. Pt is taking metoprolol succinate 12.5mg  twice daily which she was put on due to a monitor that showed some pSVT, she has been on this since December 2021. Will forward to provider and pharmacy team to advise.

## 2021-10-12 NOTE — Telephone Encounter (Signed)
Pt c/o BP issue: STAT if pt c/o blurred vision, one-sided weakness or slurred speech  1. What are your last 5 BP readings? 135/67 pule 55 123/92 pulse 66 on Saturday    126/58 pulse 53  2. Are you having any other symptoms (ex. Dizziness, headache, blurred vision, passed out)? Heaviness in her chest and short of breath   3. What is your BP issue? Blood pressure running low

## 2021-10-14 NOTE — Telephone Encounter (Signed)
Returned call to pt and advise we are awaiting Dr. Judeth Cornfield response Pt state yesterday BP was 144/72 HR 72 and today 134/64 HR 75. She state she went for a short walk and developed some chest discomfort and is still  having the same SOB with exertion as discussed previously with MD.  Appointment scheduled for 10/16 with Laurann Montana for further evaluations. Pt also made aware of ED precaution should any new symptoms develop or worsen.

## 2021-10-14 NOTE — Telephone Encounter (Signed)
Routed to primary nurse 

## 2021-10-14 NOTE — Telephone Encounter (Signed)
Patient returned your call.

## 2021-10-14 NOTE — Telephone Encounter (Signed)
Left message to call back  

## 2021-10-15 NOTE — Telephone Encounter (Signed)
Agree with recommendations. Heart rates in the 70s should not be causing chest heaviness, and blood pressures are well controlled. She has been on this dose of metoprolol for some time, so I would be surprised if this is due to the metoprolol. She can trial taking just once a day, but she may have racing heartbeats on this. If she has worsening symptoms or symptoms at rest, she needs to come to the ER to be evaluated.

## 2021-10-16 NOTE — Telephone Encounter (Addendum)
Called pt and informed of MD's recommendations. Pt state she would prefer to continue current dose of Metoprolol 12.5 mg BID until she see Urban Gibson based on the below BP readings.  11/3-124/66 HR 63  11/3-155/68 HR 67 (1:24 pm) 11/3-138/71 HR 80 (before bed) 11/4-144/78 HR 68

## 2021-10-27 NOTE — Progress Notes (Signed)
Office Visit    Patient Name: Shannon [name] Date of Encounter: 10/28/2021  PCP:  Donalynn Furlong, [place]  Cardiologist:  Buford Dresser, MD  Advanced Practice Provider:  No care team member to display Electrophysiologist:  None    Chief Complaint    Shannon [name] is a 75 y.o. 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   Arthritis    Cancer ([place])    skin - squamous cell   Complication of anesthesia    " I had difficulty coming out of it"   Dyspnea    with exertion   Family history of adverse reaction to anesthesia    " My daughter has a problem coming ou to it"   GERD (gastroesophageal reflux disease)    Headache    migraines in the past   Hypertension    Hypothyroidism    Stroke [facility]) 11/2018   some weakness on right side    SVT (supraventricular tachycardia) ([place])    Thyroid disease    Past Surgical History:  Procedure Laterality Date   ABDOMINAL HYSTERECTOMY     APPENDECTOMY     CARDIAC CATHETERIZATION     CARPAL TUNNEL RELEASE Bilateral    COLONOSCOPY     DILATION AND CURETTAGE OF UTERUS     IR ANGIO INTRA EXTRACRAN SEL COM CAROTID INNOMINATE BILAT MOD SED  12/25/2018   IR ANGIO INTRA EXTRACRAN SEL COM CAROTID INNOMINATE BILAT MOD SED  10/30/2019   IR ANGIO INTRA EXTRACRAN SEL COM CAROTID INNOMINATE BILAT MOD SED  09/23/2020   IR ANGIO VERTEBRAL SEL SUBCLAVIAN INNOMINATE UNI L MOD SED  12/25/2018   IR ANGIO VERTEBRAL SEL SUBCLAVIAN INNOMINATE UNI L MOD SED  10/30/2019   IR ANGIO VERTEBRAL SEL SUBCLAVIAN INNOMINATE UNI L MOD SED  09/23/2020   IR ANGIO VERTEBRAL SEL SUBCLAVIAN INNOMINATE UNI R MOD SED  01/04/2019   IR ANGIO VERTEBRAL SEL VERTEBRAL UNI R MOD SED  12/25/2018   IR ANGIO VERTEBRAL SEL VERTEBRAL UNI R MOD SED  10/30/2019   IR ANGIO  VERTEBRAL SEL VERTEBRAL UNI R MOD SED  09/23/2020   IR CT HEAD LTD  01/04/2019   IR CT HEAD LTD  11/21/2019   IR INTRA CRAN STENT  01/04/2019   IR INTRA CRAN STENT  11/21/2019   IR RADIOLOGIST EVAL & MGMT  03/12/2021   IR RADIOLOGIST EVAL & MGMT  09/10/2021   IR TRANSCATH EXCRAN VERT OR CAR A STENT  11/17/2020   IR US GUIDE VASC ACCESS RIGHT  12/25/2018   IR US GUIDE VASC ACCESS RIGHT  11/02/2019   OVARIAN CYST REMOVAL     RADIOLOGY WITH ANESTHESIA N/A 01/04/2019   Procedure: RMCA angioplasty with possible stenting;  Surgeon: Luanne Bras, MD;  Location: [place];  Service: Radiology;  Laterality: N/A;   RADIOLOGY WITH ANESTHESIA N/A 11/21/2019   Procedure: IR WITH ANESTHESIA/ STENT PLACEMENT;  Surgeon: Luanne Bras, MD;  Location: [place] [facility];  Service: Radiology;  Laterality: N/A;   RADIOLOGY WITH ANESTHESIA N/A 10/01/2020   Procedure: IR WITH ANESTHESIA ANGIOPLASTY WITH POSSIBLE STENTING;  Surgeon: Luanne Bras, MD;  Location: [facility] [place];  Service: Radiology;  Laterality: N/A;   RADIOLOGY WITH ANESTHESIA N/A 11/17/2020   Procedure: IR WITH ANESTHESIA STENT  PLACEMENT;  Surgeon: Luanne Bras, MD;  Location: [place];  Service: Radiology;  Laterality: N/A;    Allergies  Allergies  Allergen Reactions   Elastic Bandages & [Zinc] Itching   Sulfa Antibiotics Other (See Comments)    Tongue turned black   Tape     Pulled blood through the skin- happened during a procedure in Oct 2021    Mandol [Cefamandole] Rash   Sinutab Non-Drowsy Max [Pseudoephedrine-Acetaminophen] Palpitations    History of Present Illness    Shannon [name] is a 75 y.o. female with a hx of hypertension, CVA last seen 06/16/21 via video visit.  Prior cardiac cath (date unknown) with reported 30% RCA lesion. Prior echo 2019 with normal LVEF, negative bubble study. Previous ETT 02/26/21 with fair exercise tolerance, 31mm up-sloping to horizontal ST depressions in inferior lateral leads of borderline significance. Had cataract  surgery June 2022.  Last seen in clinic 06/16/21 via video visit. Her blood pressure was low normal at 104/54. Noted her brother recently had a stress test and wondered whether she needed repeat ischemic evaluation. She was without chest pain but noted occasional palpitations when laying down. Given history of stroke, stress test not recommended. Recommended to continue monitoring symptoms and consider CT vs cath if increasing anginal symptoms.   She called 10/12/21 noting bradycardia in the 50s associated with dyspnea and fatigue. She was told she could decrease Metoprolol from BID to QD but preferred to continue current dosing til follow up.  She presents today for follow up. Tells me she [place] her [place]. She would walk daily in the morning and it was relatively flat. When she returned to [facility] to her regular walking in her neighborhood with an incline she notes exertional dyspnea. Also notes with walking up her stairs at home. Tells me her blood pressure has been labile. Often wakes up feeling fatigued. On the day she felt poorly - woke up feeling well, walked 2 miles with her husband, and then felt poorly the rest of the day. This is when she started noting low heart rate readings. Monitors BP at home multiple times per day. BP at home most often 130s-140s. Does note occasional hypotensive readings (SBP 99) or hypertensive readings with SBP 150s. Notes occasional lightheadedness but no near syncope nor syncope. Tells me she had isolated episodes of palpitations last night but otherwise not bothersome. Throughout the day drinks one cup of coffee and two 16 oz bottles of water. She eats breakfast in the morning, a large lunch, and then a small dinner such as a piece of fruit and peanut butter cracker.   EKGs/Labs/Other Studies Reviewed:   The following studies were reviewed today:  Monitor 08/19/20 7 days of data recorded on Zio monitor.  Patient had a min HR of 50 bpm, max HR of 158 bpm, and avg HR of 85 bpm. Predominant underlying rhythm was Sinus Rhythm. No VT, atrial fibrillation, high degree block, or pauses noted. Isolated atrial and ventricular ectopy was rare (<1%). 524 Supraventricular Tachycardia runs occurred, the run with the fastest interval lasting 6 beats with a max rate of 158 bpm, the longest lasting 19 beats with an avg rate of 104 bpm.   ETT 02/26/21 Blood pressure demonstrated a normal response to exercise. Upsloping ST segment depression ST segment depression of 1 mm was noted during stress in the V5, V6, III and aVF leads.   ETT with fair exercise tolerance (6:19); no  chest pain; normal blood pressure response; study terminated secondary to dyspnea; 1 mm of upsloping to horizontal ST depression in the inferior lateral leads of borderline significance; patient achieved 74% (inadequate) predicted maximum heart rate; suggest alternative study if clinical suspicion of ischemia high (nuclear study or cardiac CTA).   EKG:  EKG is ordered today.  The ekg ordered today demonstrates SB 56 bpmw ith sinus arrhythmia and pause of 1.68 seconds.   Recent Labs: 11/18/2020: BUN 8; Hemoglobin 12.3; Platelets 184; Potassium 3.7; Sodium 135 09/08/2021: Creatinine, Ser 0.70  Recent Lipid Panel    Component Value Date/Time   CHOL 199 12/09/2018 0352   TRIG 110 12/09/2018 0352   HDL 45 12/09/2018 0352   CHOLHDL 4.4 12/09/2018 0352   VLDL 22 12/09/2018 0352   LDLCALC 132 (H) 12/09/2018 0352    Home Medications   Current Meds  Medication Sig   acetaminophen (TYLENOL) 325 MG tablet Take 325 mg by mouth every 6 (six) hours as needed for headache.    aspirin EC 81 MG EC tablet Take 1 tablet (81 mg total) by mouth daily.   atorvastatin (LIPITOR) 40 MG tablet Take 1 tablet (40 mg total) by mouth daily at 6 PM.   busPIRone (BUSPAR) 5 MG tablet Take 5 mg by mouth daily as needed (anxiety).    Calcium Carb-Cholecalciferol (CALCIUM  600 + D PO) Take 1,500 mg by mouth See admin instructions. Take 1 tablet after breakfast, 1 tablet after lunch, and 0.5 tablet after dinner   Carboxymethylcellulose Sodium (ARTIFICIAL TEARS OP) Place 1 drop into both eyes 3 (three) times daily as needed (dry eyes).    Coenzyme Q10 50 MG CAPS Take 50 mg by mouth daily.    levothyroxine (SYNTHROID) 75 MCG tablet Take 75 mcg by mouth every Tuesday, Thursday, Saturday, and Sunday.    levothyroxine (SYNTHROID, LEVOTHROID) 50 MCG tablet Take 50 mcg by mouth every Monday, Wednesday, and Friday.    loratadine (CLARITIN) 10 MG tablet Take 10 mg by mouth daily.   Multiple Vitamin (MULTIVITAMIN) capsule Take 1 capsule by mouth daily. Senior   Polyvinyl Alcohol-Povidone PF (REFRESH) 1.4-0.6 % SOLN Apply 1 drop to eye as needed (dry eye). Both eyes   Propylene Glycol (SYSTANE BALANCE OP) Apply 1 drop to eye as needed (dry eye). Both eyes.   ticagrelor (BRILINTA) 60 MG TABS tablet Take 0.5 tablets (30 mg total) by mouth 2 (two) times daily.   vitamin E 400 UNIT capsule Take 400 Units by mouth every other day.    [DISCONTINUED] metoprolol succinate (TOPROL-XL) 25 MG 24 hr tablet Take 12.5 mg by mouth in the morning and at bedtime.     Review of Systems      All other systems reviewed and are otherwise negative except as noted above.  Physical Exam    VS:  BP (!) 170/82 (BP Location: Left Arm, Patient Position: Sitting, Cuff Size: Normal)   Pulse (!) 56   Ht 5\' 5"  (1.651 m)   Wt 117 lb 6.4 oz (53.3 kg)   BMI 19.54 kg/m  , BMI Body mass index is 19.54 kg/m.  Wt Readings from Last 3 Encounters:  10/28/21 117 lb 6.4 oz (53.3 kg)  06/16/21 116 lb (52.6 kg)  03/16/21 118 lb (53.5 kg)     GEN: Well nourished, well developed, in no acute distress. HEENT: normal. Neck: Supple, no JVD, carotid bruits, or masses. Cardiac: RRR, no murmurs, rubs, or gallops. No clubbing, cyanosis, edema.  Radials/PT 2+ and equal  bilaterally.  Respiratory:  Respirations  regular and unlabored, clear to auscultation bilaterally. GI: Soft, nontender, nondistended. MS: No deformity or atrophy. Skin: Warm and dry, no rash. Neuro:  Strength and sensation are intact. Anxious.  Psych: Normal affect.  Assessment & Plan    Atypical chest pain / Coronary artery disease - Reports exertional dyspnea with walking stairs and incline. EKG today with no acute ST/T wave changes.Previous cath 2013 with 30% RCA stenosis. Consider etiology of exertional dyspnea deconditioning vs coronary disease. Will plan for cardiac CTA.   Palpitations / Tachycardia / Bradycardia / Sinus pause 1.68 sec - Previous monitor with paroxymal SVT. Notes occasional palpitations. EKG today SB 56 bpm with sinus arrhythmia and pause of 1.68 seconds. Palpitations likely precipitated by anxiety and dehydration. Encourage to increase to >32 oz of fluid intake per day. Given bradycardia and sinus pause, reduce Toprol XL to 12.5mg  QD. If persistent palpitations with lowered dose Toprol may need to refer to EP. Labs today including BMP, magnesium, CBC, thyroid panel to rule out labwork abnormality as contributory.  HTN - Labile blood pressure at home. Per Dr. Harrell Gave base antihypertensive regimen on home readings as element of [place] coat hypertension. We discussed the minimal impact Toprol would have on her blood pressure. Recommend she bring her blood pressure cuff to next office visit. If BP persistently >140/90 at home, consider additional agent.   Hypothyroidism - Managed by primary care. Now with palpitations, bradycardia. Thyroid panel collected today.   History of CVA - On Aspirin, Brilinta per interventional neurology. Follows with neurology.   Disposition: Follow up  in January as scheduled  with Buford Dresser, MD or APP.  Signed, Loel Dubonnet, NP 10/28/2021, 12:06 PM [place]

## 2021-10-28 ENCOUNTER — Other Ambulatory Visit: Payer: Self-pay

## 2021-10-28 ENCOUNTER — Ambulatory Visit (INDEPENDENT_AMBULATORY_CARE_PROVIDER_SITE_OTHER): Payer: Medicare Other | Admitting: Family

## 2021-10-28 ENCOUNTER — Encounter (HOSPITAL_BASED_OUTPATIENT_CLINIC_OR_DEPARTMENT_OTHER): Payer: Self-pay | Admitting: Family

## 2021-10-28 VITALS — BP 170/82 | HR 56 | Ht 65.0 in | Wt 117.4 lb

## 2021-10-28 DIAGNOSIS — R079 Chest pain, unspecified: Secondary | ICD-10-CM | POA: Diagnosis not present

## 2021-10-28 DIAGNOSIS — I471 Supraventricular tachycardia, unspecified: Secondary | ICD-10-CM

## 2021-10-28 DIAGNOSIS — R072 Precordial pain: Secondary | ICD-10-CM

## 2021-10-28 DIAGNOSIS — Z8673 Personal history of transient ischemic attack (TIA), and cerebral infarction without residual deficits: Secondary | ICD-10-CM

## 2021-10-28 DIAGNOSIS — R0609 Other forms of dyspnea: Secondary | ICD-10-CM

## 2021-10-28 DIAGNOSIS — E039 Hypothyroidism, unspecified: Secondary | ICD-10-CM | POA: Diagnosis not present

## 2021-10-28 DIAGNOSIS — R0989 Other specified symptoms and signs involving the circulatory and respiratory systems: Secondary | ICD-10-CM

## 2021-10-28 MED ORDER — METOPROLOL SUCCINATE ER 25 MG PO TB24: 12.5000 mg | ORAL_TABLET | Freq: Every day | ORAL | 3 refills | Status: DC

## 2021-10-28 NOTE — Patient Instructions (Signed)
Medication Instructions:  Your physician has recommended you make the following change in your medication:  Change:  Metoprolol Succinat to half tablet (12.5mg ) once per day   *If you need a refill on your cardiac medications before your next appointment, please call your pharmacy*   Lab Work: Your physician recommends that you return for lab work in:   Today- BMP, Magnesium, CBC, Thyroid Panel on the third floor   If you have labs (blood work) drawn today and your tests are completely normal, you will receive your results only by: [place] (if you have MyChart) OR A paper copy in the mail If you have any lab test that is abnormal or we need to change your treatment, we will call you to review the results.   Testing/Procedures:   Your cardiac CT will be scheduled at one of the below locations:   [facility] [address] [place], [place] [address] [phone]  If scheduled at [facility], please arrive at the [facility] main entrance (entrance A) of [facility] 30 minutes prior to test start time. You can use the FREE valet parking offered at the main entrance (encouraged to control the heart rate for the test) Proceed to the [facility] Radiology Department (first floor) to check-in and test prep.  Please follow these instructions carefully (unless otherwise directed):  On the Night Before the Test: Be sure to Drink plenty of water. Do not consume any caffeinated/decaffeinated beverages or chocolate 12 hours prior to your test. Do not take any antihistamines 12 hours prior to your test. On the Day of the Test: Drink plenty of water until 1 hour prior to the test. Do not eat any food 4 hours prior to the test. You may take your regular medications prior to the test.  Take metoprolol (Lopressor) two hours prior to test. FEMALES- please wear underwire-free bra if available, avoid dresses & tight clothing      After the Test: Drink  plenty of water. After receiving IV contrast, you may experience a mild flushed feeling. This is normal. On occasion, you may experience a mild rash up to 24 hours after the test. This is not dangerous. If this occurs, you can take Benadryl 25 mg and increase your fluid intake. If you experience trouble breathing, this can be serious. If it is severe call 911 IMMEDIATELY. If it is mild, please call our office. If you take any of these medications: Glipizide/Metformin, Avandament, Glucavance, please do not take 48 hours after completing test unless otherwise instructed.  Please allow 2-4 weeks for scheduling of routine cardiac CTs. Some insurance companies require a pre-authorization which may delay scheduling of this test.   For non-scheduling related questions, please contact the cardiac imaging nurse navigator should you have any questions/concerns: Marchia Bond, Cardiac Imaging Nurse Navigator Gordy Clement, Cardiac Imaging Nurse Navigator [facility] Direct Office Dial: [phone]   For scheduling needs, including cancellations and rescheduling, please call [place], [phone].    Follow-Up: At [facility], you and your health needs are our priority.  As part of our continuing mission to provide you with exceptional heart care, we have created designated Provider Care Teams.  These Care Teams include your primary Cardiologist (physician) and Advanced Practice Providers (APPs -  Physician Assistants and Nurse Practitioners) who all work together to provide you with the care you need, when you need it.  We recommend signing up for the patient portal called "MyChart".  Sign up information is provided on this After Visit Summary.  MyChart is used to connect with patients for Virtual Visits (Telemedicine).  Patients are able to view lab/test results, encounter notes, upcoming appointments, etc.  Non-urgent messages can be sent to your provider as well.   To  learn more about what you can do with MyChart, go to [email or web address].    Your next appointment:   12/21/21 @ 11:20  The format for your next appointment:   In Person  Provider:   Buford Dresser, MD    Other Instructions If your heart rate is consistently more than 100 bpm, please call our office.   If your blood pressure is consistently more 140/90 or less than 100/60, please call our office.   Recommend checking your blood pressure only once per day. Please sit and rest for at least 5-10 minutes prior to checking your blood pressure.   To prevent palpitations: Make sure you are adequately hydrated.  Avoid and/or limit caffeine containing beverages like soda or tea. Exercise regularly.  Manage stress well. Some over the counter medications can cause palpitations such as Benadryl, AdvilPM, TylenolPM. Regular Advil or Tylenol do not cause palpitations.  Metoprolol Succinat to half tablet (12.5mg ) once per day

## 2021-10-29 ENCOUNTER — Encounter (HOSPITAL_BASED_OUTPATIENT_CLINIC_OR_DEPARTMENT_OTHER): Payer: Self-pay

## 2021-10-29 LAB — THYROID PANEL WITH TSH
Free Thyroxine Index: 2.6 (ref 1.2–4.9)
T3 Uptake Ratio: 30 % (ref 24–39)
T4, Total: 8.6 ug/dL (ref 4.5–12.0)
TSH: 0.773 u[IU]/mL (ref 0.450–4.500)

## 2021-10-29 LAB — CBC
Hematocrit: 41.2 % (ref 34.0–46.6)
Hemoglobin: 14.3 g/dL (ref 11.1–15.9)
MCH: 32.1 pg (ref 26.6–33.0)
MCHC: 34.7 g/dL (ref 31.5–35.7)
MCV: 93 fL (ref 79–97)
Platelets: 176 10*3/uL (ref 150–450)
RBC: 4.45 x10E6/uL (ref 3.77–5.28)
RDW: 11.7 % (ref 11.7–15.4)
WBC: 4.4 10*3/uL (ref 3.4–10.8)

## 2021-10-29 LAB — BASIC METABOLIC PANEL
BUN/Creatinine Ratio: 26 (ref 12–28)
BUN: 16 mg/dL (ref 8–27)
CO2: 23 mmol/L (ref 20–29)
Calcium: 9.9 mg/dL (ref 8.7–10.3)
Chloride: 100 mmol/L (ref 96–106)
Creatinine, Ser: 0.62 mg/dL (ref 0.57–1.00)
Glucose: 79 mg/dL (ref 70–99)
Potassium: 4.6 mmol/L (ref 3.5–5.2)
Sodium: 138 mmol/L (ref 134–144)
eGFR: 93 mL/min/{1.73_m2} (ref >59)

## 2021-10-29 LAB — MAGNESIUM: Magnesium: 2.4 mg/dL — ABNORMAL HIGH (ref 1.6–2.3)

## 2021-10-29 NOTE — Progress Notes (Signed)
Patient results shared via mychart

## 2021-12-01 ENCOUNTER — Telehealth (HOSPITAL_COMMUNITY): Payer: Self-pay | Admitting: Emergency Medicine

## 2021-12-01 NOTE — Telephone Encounter (Signed)
Pt  calling stating she got labs from PCP office visit on 11/10/21. Reports creatinine 0.64/ eGFR 92. I asked patient to bring a copy of these labs with her to her appt so we can make a copy for her chart.   Marchia Bond RN Navigator Cardiac Imaging [facility] [phone] Office  [phone] Cell

## 2021-12-01 NOTE — Telephone Encounter (Signed)
Reaching out to patient to offer assistance regarding upcoming cardiac imaging study; pt verbalizes understanding of appt date/time, parking situation and where to check in, pre-test NPO status and medications ordered, and verified current allergies; name and call back number provided for further questions should they arise Marchia Bond RN [facility] [phone] office [phone] cell  Denies iv issues (R better than L) Daily meds, holding claritin Arrival 1030a

## 2021-12-02 ENCOUNTER — Telehealth (HOSPITAL_COMMUNITY): Payer: Self-pay | Admitting: Emergency Medicine

## 2021-12-02 NOTE — Telephone Encounter (Signed)
error 

## 2021-12-03 ENCOUNTER — Ambulatory Visit (HOSPITAL_COMMUNITY)
Admission: RE | Admit: 2021-12-03 | Discharge: 2021-12-03 | Disposition: A | Discharge status: Home / Self Care | Payer: Medicare Other | Source: Ambulatory Visit | Attending: Family | Admitting: Family

## 2021-12-03 ENCOUNTER — Other Ambulatory Visit: Payer: Self-pay

## 2021-12-03 ENCOUNTER — Other Ambulatory Visit: Payer: Self-pay | Admitting: Cardiology

## 2021-12-03 DIAGNOSIS — I2510 Atherosclerotic heart disease of native coronary artery without angina pectoris: Secondary | ICD-10-CM | POA: Diagnosis not present

## 2021-12-03 DIAGNOSIS — R931 Abnormal findings on diagnostic imaging of heart and coronary circulation: Secondary | ICD-10-CM

## 2021-12-03 DIAGNOSIS — R072 Precordial pain: Secondary | ICD-10-CM | POA: Diagnosis not present

## 2021-12-03 MED ORDER — NITROGLYCERIN 0.4 MG SL SUBL: 0.8000 mg | SUBLINGUAL_TABLET | Freq: Once | SUBLINGUAL | Status: AC

## 2021-12-03 MED ORDER — DILTIAZEM HCL 25 MG/5ML IV SOLN: 10.0000 mg | INTRAVENOUS | Status: DC | PRN

## 2021-12-03 MED ORDER — NITROGLYCERIN 0.4 MG SL SUBL
SUBLINGUAL_TABLET | SUBLINGUAL | Status: AC
  Administered 2021-12-03: 11:00:00 0.8 mg via SUBLINGUAL
  Filled 2021-12-03: qty 2

## 2021-12-03 MED ORDER — IOHEXOL 350 MG/ML SOLN
95.0000 mL | Freq: Once | INTRAVENOUS | Status: AC | PRN
  Administered 2021-12-03: 12:00:00 95 mL via INTRAVENOUS

## 2021-12-03 MED ORDER — METOPROLOL TARTRATE 5 MG/5ML IV SOLN
10.0000 mg | INTRAVENOUS | Status: DC | PRN
  Administered 2021-12-03: 11:00:00 10 mg via INTRAVENOUS
  Administered 2021-12-03: 11:00:00 5 mg via INTRAVENOUS

## 2021-12-03 MED ORDER — METOPROLOL TARTRATE 5 MG/5ML IV SOLN
INTRAVENOUS | Status: AC
  Filled 2021-12-03: qty 20

## 2021-12-03 MED ORDER — DILTIAZEM HCL 25 MG/5ML IV SOLN
INTRAVENOUS | Status: AC
  Filled 2021-12-03: qty 5

## 2021-12-04 ENCOUNTER — Ambulatory Visit (HOSPITAL_COMMUNITY)
Admission: RE | Admit: 2021-12-04 | Discharge: 2021-12-04 | Disposition: A | Discharge status: Home / Self Care | Payer: Medicare Other | Source: Ambulatory Visit | Attending: Cardiology | Admitting: Cardiology

## 2021-12-04 ENCOUNTER — Telehealth (HOSPITAL_BASED_OUTPATIENT_CLINIC_OR_DEPARTMENT_OTHER): Payer: Self-pay | Admitting: Family

## 2021-12-04 ENCOUNTER — Telehealth: Payer: Self-pay | Admitting: Family

## 2021-12-04 DIAGNOSIS — R931 Abnormal findings on diagnostic imaging of heart and coronary circulation: Secondary | ICD-10-CM

## 2021-12-04 MED ORDER — ISOSORBIDE MONONITRATE ER 30 MG PO TB24: 15.0000 mg | ORAL_TABLET | Freq: Every day | ORAL | 3 refills | Status: DC

## 2021-12-04 NOTE — Addendum Note (Signed)
Addended by: Loel Dubonnet on: 12/04/2021 02:18 PM   Modules accepted: Orders

## 2021-12-04 NOTE — Telephone Encounter (Signed)
Called patient with cardiac CTA result.  CT with coronary calcium score of 150 placing her in 67 percentile.  RCA 50-69% and proximal RCA/proximal LAD/proximal LCx minimal 0-24% stenosis.  Noted PFO.  FFR 0.8 across proximal RCA suggesting borderline likelihood of hemodynamic significance.  Shares with me that she started exercising on the treadmill.  First day she felt fine for 30 minutes.  Second day she increased incline as well as speed and had episode of chest pain that was still able to exercise for about 30 minutes.  Blood pressure at home routinely running 1 18-1 20s.  She did have episode of elevated blood pressure systolic 329V in the setting of stress.  Discussed possible addition of Imdur. She reports she already has a daily headache and is concerned regarding medication changes.   Previously unable to tolerate higher dose metoprolol due to bradycardia.  She does have history of CVA and PFO noted on CT.  Will discuss with Dr. Harrell Gave whether this warrants further investigation with Dr. Burt Knack.  She has upcoming appointment 12/21/2021 with Dr. Harrell Gave.  We will discuss anginal symptoms at that time and determine next steps for medical management versus possible cardiac catheterization.  She is appreciative of the call.  Loel Dubonnet, NP

## 2021-12-04 NOTE — Telephone Encounter (Signed)
Patient states to go ahead and call in the medication that was discussed the morning.  After talking it over with her husband she has decided to start taking the medication.  She wants to know if it's addition to the metoprolol or does it replace the metoprolol.

## 2021-12-04 NOTE — Telephone Encounter (Signed)
Recommend addition of Imdur half tablet (15mg ) daily at bedtime. She should continue her current half tablet of Metoprolol.  If she notes worsening headache that does not resolve after 2-3 days or lightheadedness/dizziness should should contact us and discontinue Imdur.  Please send Rx to her preferred pharmacy.   Loel Dubonnet, NP

## 2021-12-09 ENCOUNTER — Encounter (HOSPITAL_BASED_OUTPATIENT_CLINIC_OR_DEPARTMENT_OTHER): Payer: Self-pay

## 2021-12-09 ENCOUNTER — Telehealth (HOSPITAL_BASED_OUTPATIENT_CLINIC_OR_DEPARTMENT_OTHER): Payer: Self-pay

## 2021-12-09 NOTE — Telephone Encounter (Signed)
Called patient to speak with symptoms that she is experiencing. Patient believes they are related to Isosorbide Mononitrate.    Started taking on 12/23 (pm) and woke up the next day with headache, heart palpitations. The next day patient felt a little bit better, but feels tired and groggy in the am. 12/26 unable to do any activity. 12/27 Blood pressures dropping low 107/63 P: 88, patient held metoprolol due to Low BP. A couple hours later patient rechecked blood pressure and it was 141/ 80s so patient took metoprolol. Pt endorses  pain in the chest when laid down in the evening.    This morning patient blood pressure 112/64 P: 81 and took her metoprolol, but now is not feeling good again.    NO ACTIVE CHEST PAIN.   Per note from Laurann Montana, NP on 12/23 since headache has not resolved and getting worse we will discontinue Imdur and keep scheduled follow up with Dr. Harrell Gave on January 9th!

## 2021-12-09 NOTE — Telephone Encounter (Signed)
Thanks for the update.   Recommend she continue to keep a log of BP and heart rate and bring to her next clinic visit.   Laurann Montana, NP

## 2021-12-21 ENCOUNTER — Ambulatory Visit (INDEPENDENT_AMBULATORY_CARE_PROVIDER_SITE_OTHER): Payer: Medicare Other | Admitting: Cardiology

## 2021-12-21 ENCOUNTER — Other Ambulatory Visit: Payer: Self-pay

## 2021-12-21 ENCOUNTER — Encounter (HOSPITAL_BASED_OUTPATIENT_CLINIC_OR_DEPARTMENT_OTHER): Payer: Self-pay | Admitting: Cardiology

## 2021-12-21 VITALS — BP 192/100 | HR 75 | Ht 65.0 in | Wt 117.9 lb

## 2021-12-21 DIAGNOSIS — Z8673 Personal history of transient ischemic attack (TIA), and cerebral infarction without residual deficits: Secondary | ICD-10-CM

## 2021-12-21 DIAGNOSIS — R0989 Other specified symptoms and signs involving the circulatory and respiratory systems: Secondary | ICD-10-CM

## 2021-12-21 DIAGNOSIS — R079 Chest pain, unspecified: Secondary | ICD-10-CM

## 2021-12-21 DIAGNOSIS — I2510 Atherosclerotic heart disease of native coronary artery without angina pectoris: Secondary | ICD-10-CM | POA: Diagnosis not present

## 2021-12-21 DIAGNOSIS — I471 Supraventricular tachycardia, unspecified: Secondary | ICD-10-CM

## 2021-12-21 NOTE — Progress Notes (Incomplete)
Cardiology Office Note   Date:  06/16/2021   ID:  Shannon [name], DOB [birth year], MRN [id number]  PCP:  Donalynn Furlong, MD  Cardiologist:  Buford Dresser, MD  Referring MD: Donalynn Furlong, MD   CC: follow up  History of Present Illness:    Shannon [name] is a 76 y.o. female with a hx of hypertension, CVA who is seen for follow up today. She was seen in the ER on 09/30/19 for concern for stroke, elevated blood pressure. 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  Today: She is accompanied by her husband.  She is concerned about the side effects of Imdur. Started taking on 12/23, and the next morning she developed tachycardic palpitations with a headache lasting all day. The following day she walked for a mile with mild shortness of breath but otherwise felt okay. On 11/26, she felt chest pains at night. On 11/27, her blood pressure was 107/63 with HR 88. Later that night it spiked to 181 with chest heaviness and shortness of breath. On 1/3 she had chest pain when she woke up that morning, and it lasted for a while. Her central chest pain is painful but not like "a knife stabbing through me." Also, she has intermittent chest pain inferior to her left breast for years.  Lately, by lunch time she feels very fatigued if she has walked that morning. For exercise she is usually walking 1.1 miles daily. She notes having shortness of breath mostly with inclines and climbing stairs. Additionally, she complains of bilateral LE weakness with exertion, which sometimes makes her fearful of falling.  She denies any lightheadedness, syncope, orthopnea, PND, or lower extremity edema.   Past Medical History:  Diagnosis Date   Anxiety    Artery stenosis (HCC)    cerebral   Arthritis    Cancer (HCC)    skin - squamous cell   Complication of anesthesia    " I had difficulty coming out of it"   Dyspnea    with  exertion   Family history of adverse reaction to anesthesia    " My daughter has a problem coming ou to it"   GERD (gastroesophageal reflux disease)    Headache    migraines in the past   Hypertension    Hypothyroidism    Stroke ([place]) 11/2018   some weakness on right side    SVT (supraventricular tachycardia) ([place])    Thyroid disease     Past Surgical History:  Procedure Laterality Date   ABDOMINAL HYSTERECTOMY     APPENDECTOMY     CARDIAC CATHETERIZATION     CARPAL TUNNEL RELEASE Bilateral    COLONOSCOPY     DILATION AND CURETTAGE OF UTERUS     IR ANGIO INTRA EXTRACRAN SEL COM CAROTID INNOMINATE BILAT MOD SED  12/25/2018   IR ANGIO INTRA EXTRACRAN SEL COM CAROTID INNOMINATE BILAT MOD SED  10/30/2019   IR ANGIO INTRA EXTRACRAN SEL COM CAROTID INNOMINATE BILAT MOD SED  09/23/2020   IR ANGIO VERTEBRAL SEL SUBCLAVIAN INNOMINATE UNI L MOD SED  12/25/2018   IR ANGIO VERTEBRAL SEL SUBCLAVIAN INNOMINATE UNI L MOD SED  10/30/2019   IR ANGIO VERTEBRAL SEL SUBCLAVIAN INNOMINATE UNI L MOD SED  09/23/2020   IR ANGIO VERTEBRAL SEL SUBCLAVIAN INNOMINATE UNI R MOD SED  01/04/2019   IR ANGIO VERTEBRAL SEL VERTEBRAL UNI R MOD SED  12/25/2018   IR ANGIO VERTEBRAL SEL VERTEBRAL UNI  R MOD SED  10/30/2019   IR ANGIO VERTEBRAL SEL VERTEBRAL UNI R MOD SED  09/23/2020   IR CT HEAD LTD  01/04/2019   IR CT HEAD LTD  11/21/2019   IR INTRA CRAN STENT  01/04/2019   IR INTRA CRAN STENT  11/21/2019   IR RADIOLOGIST EVAL & MGMT  03/12/2021   IR RADIOLOGIST EVAL & MGMT  09/10/2021   IR TRANSCATH EXCRAN VERT OR CAR A STENT  11/17/2020   IR US GUIDE VASC ACCESS RIGHT  12/25/2018   IR US GUIDE VASC ACCESS RIGHT  11/02/2019   OVARIAN CYST REMOVAL     RADIOLOGY WITH ANESTHESIA N/A 01/04/2019   Procedure: RMCA angioplasty with possible stenting;  Surgeon: Luanne Bras, MD;  Location: [facility];  Service: Radiology;  Laterality: N/A;   RADIOLOGY WITH ANESTHESIA N/A 11/21/2019   Procedure: IR WITH ANESTHESIA/ STENT  PLACEMENT;  Surgeon: Luanne Bras, MD;  Location: [facility];  Service: Radiology;  Laterality: N/A;   RADIOLOGY WITH ANESTHESIA N/A 10/01/2020   Procedure: IR WITH ANESTHESIA ANGIOPLASTY WITH POSSIBLE STENTING;  Surgeon: Luanne Bras, MD;  Location: [facility];  Service: Radiology;  Laterality: N/A;   RADIOLOGY WITH ANESTHESIA N/A 11/17/2020   Procedure: IR WITH ANESTHESIA STENT PLACEMENT;  Surgeon: Luanne Bras, MD;  Location: [facility];  Service: Radiology;  Laterality: N/A;    Current Medications: Current Outpatient Medications on File Prior to Visit  Medication Sig   acetaminophen (TYLENOL) 325 MG tablet Take 325 mg by mouth every 6 (six) hours as needed for headache.    aspirin EC 81 MG EC tablet Take 1 tablet (81 mg total) by mouth daily.   atorvastatin (LIPITOR) 40 MG tablet Take 1 tablet (40 mg total) by mouth daily at 6 PM.   busPIRone (BUSPAR) 5 MG tablet Take 5 mg by mouth daily as needed (anxiety).    Calcium Carb-Cholecalciferol (CALCIUM 600 + D PO) Take 1,500 mg by mouth See admin instructions. Take 1 tablet after breakfast, 1 tablet after lunch, and 0.5 tablet after dinner   Carboxymethylcellulose Sodium (ARTIFICIAL TEARS OP) Place 1 drop into both eyes 3 (three) times daily as needed (dry eyes).    Coenzyme Q10 50 MG CAPS Take 50 mg by mouth daily.    levothyroxine (SYNTHROID) 75 MCG tablet Take 75 mcg by mouth every Tuesday, Thursday, Saturday, and Sunday.    levothyroxine (SYNTHROID, LEVOTHROID) 50 MCG tablet Take 50 mcg by mouth every Monday, Wednesday, and Friday.    loratadine (CLARITIN) 10 MG tablet Take 10 mg by mouth daily.   metoprolol succinate (TOPROL-XL) 25 MG 24 hr tablet Take 0.5 tablets (12.5 mg total) by mouth daily.   Multiple Vitamin (MULTIVITAMIN) capsule Take 1 capsule by mouth daily. Senior   Polyvinyl Alcohol-Povidone PF (REFRESH) 1.4-0.6 % SOLN Apply 1 drop to eye as needed (dry eye). Both eyes   Propylene Glycol (SYSTANE BALANCE OP) Apply 1 drop to  eye as needed (dry eye). Both eyes.   ticagrelor (BRILINTA) 60 MG TABS tablet Take 0.5 tablets (30 mg total) by mouth 2 (two) times daily.   vitamin E 400 UNIT capsule Take 400 Units by mouth every other day.    No current facility-administered medications on file prior to visit.     Allergies:   Elastic bandages & [zinc], Sulfa antibiotics, Tape, Mandol [cefamandole], and Sinutab non-drowsy max [pseudoephedrine-acetaminophen]   Social History   Tobacco Use   Smoking status: Never   Smokeless tobacco: Never  Vaping Use  Vaping Use: Never used  Substance Use Topics   Alcohol use: Never   Drug use: Never    Family History: family history includes CVA in her maternal aunt and mother; Heart disease in her mother; Peripheral Artery Disease in her father.  ROS:   Please see the history of present illness.   (+) Palpitations (+) Chest pain (+) Headache (+) Shortness of breath (+) Bilateral LE weakness (+) Fatigue Additional pertinent ROS otherwise unremarkable.  EKGs/Labs/Other Studies Reviewed:    The following studies were reviewed today:  Coronary CT/Calcium Score 12/03/2021: Coronary Arteries:  Normal coronary origin.  Right dominance.   RCA is a large dominant artery that gives rise to PDA and PLA. There is noncalcified plaque in the proximal RCA causing 50-69% stenosis. Calcified plaque in the proximal RCA causes 0-24% stenosis   Left main is a large artery that gives rise to LAD and LCX arteries.   LAD is a large vessel. There is calcified plaque in the proximal LAD causing 0-24% stenosis   LCX is a non-dominant artery that gives rise to one large OM1 branch. There is calcified plaque in the proximal LCX causing 0-24% stenosis   Other findings:   Left Ventricle: Normal size   Left Atrium: Mild enlargement. PFO   Pulmonary Veins: Normal configuration   Right Ventricle: Normal size   Right Atrium: Normal size   Cardiac valves: No calcifications    Thoracic aorta: Normal size   Pulmonary Arteries: Normal size   Systemic Veins: Normal drainage   Pericardium: Normal thickness   IMPRESSION: 1. Coronary calcium score of 150. This was 67th percentile for age and sex matched control.   2.  Normal coronary origin with right dominance.   3. Noncalcified plaque in the proximal RCA causes moderate (50-69%) stenosis   4. Calcified plaque in the proximal RCA, proximal LAD, and proximal LCX causes minimal (0-24%) stenosis   5.  PFO   6.  Will send for CT FFR   CAD-RADS 3. Moderate stenosis. Consider symptom-guided anti-ischemic pharmacotherapy as well as risk factor modification per guideline directed care. Additional analysis with CT FFR will be submitted.  ETT 02/26/21: Blood pressure demonstrated a normal response to exercise. Upsloping ST segment depression ST segment depression of 1 mm was noted during stress in the V5, V6, III and aVF leads.   ETT with fair exercise tolerance (6:19); no chest pain; normal blood pressure response; study terminated secondary to dyspnea; 1 mm of upsloping to horizontal ST depression in the inferior lateral leads of borderline significance; patient achieved 74% (inadequate) predicted maximum heart rate; suggest alternative study if clinical suspicion of ischemia high (nuclear study or cardiac CTA).   Monitor 07/31/20 7 days of data recorded on Zio monitor. Patient had a min HR of 50 bpm, max HR of 158 bpm, and avg HR of 85 bpm. Predominant underlying rhythm was Sinus Rhythm. No VT, atrial fibrillation, high degree block, or pauses noted. Isolated atrial and ventricular ectopy was rare (<1%). 524 Supraventricular Tachycardia runs occurred, the run with the fastest interval lasting 6 beats with a max rate of 158 bpm, the longest lasting 19 beats with an avg rate of 104 bpm.   Echo 12/09/2018 - Left ventricle: The cavity size was normal. Systolic function was   normal. The estimated ejection fraction  was in the range of 60%   to 65%. Wall motion was normal; there were no regional wall   motion abnormalities. Left ventricular diastolic function   parameters  were normal. - Left atrium: The atrium was normal in size. - Right ventricle: Systolic function was normal. - Atrial septum: No defect or patent foramen ovale was identified.   Echo contrast study showed no right-to-left atrial level shunt,   at baseline or with provocation. - Pulmonary arteries: Systolic pressure was within the normal   range.  ETT 09/04/2018 ([facility]) Baseline HR bpm 70   Baseline BP mmHg 186/83   Peak HR bpm 121   PMHR % % 82   Peak BP mmHg 213/80   Exercise duration (min) min 9   Exercise duration (sec) sec 0   Stage Reached  3   Estimated workload METS 10.1   ST Deviation mm 0.0   Duke Treadmill Score  5.0   Angina Score for Duke Treadmill Score  1    Narrative    Minor non-specific ST and T wave changes noted during stress or recovery   Overall, the patient's exercise capacity was excellent.   Negative ETT for ischemia at workload achieved   The patient experienced atypical chest pain during the stress test.   No significant arrhythmia noted   Reassurance.  BP control important.   Cardiac cath 04/2012 [facility] [place]) Per note 07/17/2015  Carotid doppler 08/16/2014  Normal Duplex Doppler of Carotids   Cardiovascular stress test 04/2012  NL LVEF; inferior ischemia   Cardiac catheterization 04/2012  Nonobstructive 30% RCA disease otherwise NL and EF 65%  EKG:  Personally reviewed today 12/21/2021: *** 4/22- EKG was not ordered today 04/09/20- NSR with sinus arrhythmia  Recent Labs: 10/28/2021: BUN 16; Creatinine, Ser 0.62; Hemoglobin 14.3; Magnesium 2.4; Platelets 176; Potassium 4.6; Sodium 138; TSH 0.773   Recent Lipid Panel    Component Value Date/Time   CHOL 199 12/09/2018 0352   TRIG 110 12/09/2018 0352   HDL 45 12/09/2018 0352   CHOLHDL 4.4 12/09/2018 0352   VLDL 22 12/09/2018 0352    LDLCALC 132 (H) 12/09/2018 0352    Physical Exam:    VS:  BP (!) 192/100    Pulse 75    Ht 5\' 5"  (1.651 m)    Wt 117 lb 14.4 oz (53.5 kg)    SpO2 97%    BMI 19.62 kg/m     Wt Readings from Last 3 Encounters:  12/21/21 117 lb 14.4 oz (53.5 kg)  10/28/21 117 lb 6.4 oz (53.3 kg)  06/16/21 116 lb (52.6 kg)   GEN: Well nourished, well developed in no acute distress HEENT: Normal, moist mucous membranes NECK: No JVD CARDIAC: regular rhythm, normal S1 and S2, no rubs or gallops. No murmur. VASCULAR: Radial and DP pulses 2+ bilaterally. No carotid bruits RESPIRATORY:  Clear to auscultation without rales, wheezing or rhonchi  ABDOMEN: Soft, non-tender, non-distended MUSCULOSKELETAL:  Ambulates independently SKIN: Warm and dry, no edema NEUROLOGIC:  Alert and oriented x 3. No focal neuro deficits noted. PSYCHIATRIC:  Normal affect    ASSESSMENT:    No diagnosis found.   PLAN:    Atypical chest pain, fatigue: chest discomfort largely resolved, except that she can feel her heart more at night -reviewed ETT. Nonspecific changes but nondiagnostic -we discussed options for further evaluation, including risk/benefit of myoview or CT coronary. We also discussed coronary angiography. -she does not ever want lexiscan myoview, and given stroke history I agree with trying to avoid this. We discussed pros/cons and limitations of CT coronary. Also discussed risk/benefit of cath. I cannot see her prior cath, but only noted to have 30%  RCA lesion at that time. -after reviewing options, we will continue to monitor symptoms at this time. If symptoms progress, will then discuss CT vs cath. -cautioned on red flag warning signs that need immediate medical attention  Palpitations, tachycardia: -monitor shows paroxysmal SVT  Hypertension: labile readings -typically higher in office, use home numbers for medication adjustments -was running symptomatically low at home, better with cessation of lisinopril  and amlodipine -did not tolerate diltiazem for pSVT, changed to metoprolol -[place] blood [place], perhaps due to decreased stress and more exercise. Gave instructions on metoprolol. If BP normal to elevated for her, would continue to take 12.5 mg metoprolol succinate BID. If borderline, would take only PM dose. Would hold for systolics <747   History of CVA, with need for secondary prevention: -on aspirin, ticagrelor. Ticagrelor duration per interventional neurology -on high intensity atorvastatin -echo 12/09/18 was normal, negative bubble study  Cardiac risk counseling and prevention recommendations: -recommend heart healthy/Mediterranean diet, with whole grains, fruits, vegetable, fish, lean meats, nuts, and olive oil. Limit salt. -recommend moderate walking, 3-5 times/week for 30-50 minutes each session. Aim for at least 150 minutes.week. Goal should be pace of 3 miles/hours, or walking 1.5 miles in 30 minutes -recommend avoidance of tobacco products. Avoid excess alcohol.   Plan for follow up: 3-4 weeks or sooner as needed  Medication Adjustments/Labs and Tests Ordered: Current medicines are reviewed at length with the patient today.  Concerns regarding medicines are outlined above.   No orders of the defined types were placed in this encounter.  No orders of the defined types were placed in this encounter.  There are no Patient Instructions on file for this visit.   I,Mathew Stumpf,acting as a Education administrator for [employer], MD.,have documented all relevant documentation on the behalf of Buford Dresser, MD,as directed by  Buford Dresser, MD while in the presence of Buford Dresser, MD.  ***   Signed, Buford Dresser, MD PhD 12/21/2021    [facility]

## 2021-12-21 NOTE — Patient Instructions (Signed)
Medication Instructions:  Your Physician recommend you continue on your current medication as directed.    *If you need a refill on your cardiac medications before your next appointment, please call your pharmacy*   Lab Work: None ordered today   Testing/Procedures: None ordered today   Follow-Up: At [facility], you and your health needs are our priority.  As part of our continuing mission to provide you with exceptional heart care, we have created designated Provider Care Teams.  These Care Teams include your primary Cardiologist (physician) and Advanced Practice Providers (APPs -  Physician Assistants and Nurse Practitioners) who all work together to provide you with the care you need, when you need it.  We recommend signing up for the patient portal called "MyChart".  Sign up information is provided on this After Visit Summary.  MyChart is used to connect with patients for Virtual Visits (Telemedicine).  Patients are able to view lab/test results, encounter notes, upcoming appointments, etc.  Non-urgent messages can be sent to your provider as well.   To learn more about what you can do with MyChart, go to [email or web address].    Your next appointment:   3-4 week(s)  The format for your next appointment:   In Person  Provider:   Buford Dresser, MD

## 2021-12-29 ENCOUNTER — Telehealth: Payer: Self-pay | Admitting: Student

## 2021-12-29 MED ORDER — TICAGRELOR 60 MG PO TABS: 30.0000 mg | ORAL_TABLET | Freq: Two times a day (BID) | ORAL | 1 refills | Status: DC

## 2021-12-29 NOTE — Telephone Encounter (Signed)
Brilinta e-prescribed to the patient's preferred [facility] in [place]. 60 mg tablets, take 1/2 tablet (30 mg) by mouth twice daily x 90 with one refill. Patient due for repeat imaging April 2023.  Soyla Dryer, [place] [phone] 12/29/2021, 12:28 PM

## 2022-01-11 ENCOUNTER — Other Ambulatory Visit: Payer: Self-pay

## 2022-01-11 ENCOUNTER — Ambulatory Visit (INDEPENDENT_AMBULATORY_CARE_PROVIDER_SITE_OTHER): Payer: Medicare Other | Admitting: Cardiology

## 2022-01-11 ENCOUNTER — Encounter (HOSPITAL_BASED_OUTPATIENT_CLINIC_OR_DEPARTMENT_OTHER): Payer: Self-pay | Admitting: Cardiology

## 2022-01-11 VITALS — BP 135/68 | HR 64 | Ht 65.0 in | Wt 118.3 lb

## 2022-01-11 DIAGNOSIS — R079 Chest pain, unspecified: Secondary | ICD-10-CM | POA: Diagnosis not present

## 2022-01-11 DIAGNOSIS — I2510 Atherosclerotic heart disease of native coronary artery without angina pectoris: Secondary | ICD-10-CM | POA: Diagnosis not present

## 2022-01-11 DIAGNOSIS — R002 Palpitations: Secondary | ICD-10-CM | POA: Diagnosis not present

## 2022-01-11 DIAGNOSIS — I471 Supraventricular tachycardia: Secondary | ICD-10-CM

## 2022-01-11 DIAGNOSIS — R42 Dizziness and giddiness: Secondary | ICD-10-CM | POA: Diagnosis not present

## 2022-01-11 DIAGNOSIS — Z8673 Personal history of transient ischemic attack (TIA), and cerebral infarction without residual deficits: Secondary | ICD-10-CM

## 2022-01-11 DIAGNOSIS — I10 Essential (primary) hypertension: Secondary | ICD-10-CM

## 2022-01-11 NOTE — Progress Notes (Signed)
Cardiology Office Note   Date:  06/16/2021   ID:  Shannon [name], DOB 05-16-1946, MRN [id number]  PCP:  Donalynn Furlong, MD  Cardiologist:  Buford Dresser, MD  Referring MD: Donalynn Furlong, MD   CC: follow up  History of Present Illness:    Shannon [name] is a 76 y.o. female with a hx of hypertension, CVA who is seen for follow up today. She was seen in the ER on 09/30/19 for concern for stroke, elevated blood pressure. 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  Today: This past Saturday she suffered a persistent episode of lightheadedness lasting all day. She reports that she did not feel dizzy, but still needed to be careful not to fall while walking. Associated symptoms included cephalic pressure, dyspnea, and pounding palpitations with every heart beat. She checked her blood pressure and found it to be 122/68 with a heart rate of 49. Later that day she went walking outside and did well until she returned home, when she developed lower back pain.  Yesterday, she was feeling a little better but was very fatigued and she stayed indoors.  Additionally, she complains of dryness in her mouth and throat. This has been waking her up in the middle of the night with dysphagia.  She only notices mild swelling in her legs at the end of the day if she has been on her feet for most of the day. This usually improves by morning.  She denies any chest pain, syncope, orthopnea, or PND.   Past Medical History:  Diagnosis Date   Anxiety    Artery stenosis (HCC)    cerebral   Arthritis    Cancer ([place])    skin - squamous cell   Complication of anesthesia    " I had difficulty coming out of it"   Dyspnea    with exertion   Family history of adverse reaction to anesthesia    " My daughter has a problem coming ou to it"   GERD (gastroesophageal reflux disease)    Headache    migraines in the past    Hypertension    Hypothyroidism    Stroke ([place]) 11/2018   some weakness on right side    SVT (supraventricular tachycardia) ([place])    Thyroid disease     Past Surgical History:  Procedure Laterality Date   ABDOMINAL HYSTERECTOMY     APPENDECTOMY     CARDIAC CATHETERIZATION     CARPAL TUNNEL RELEASE Bilateral    COLONOSCOPY     DILATION AND CURETTAGE OF UTERUS     IR ANGIO INTRA EXTRACRAN SEL COM CAROTID INNOMINATE BILAT MOD SED  12/25/2018   IR ANGIO INTRA EXTRACRAN SEL COM CAROTID INNOMINATE BILAT MOD SED  10/30/2019   IR ANGIO INTRA EXTRACRAN SEL COM CAROTID INNOMINATE BILAT MOD SED  09/23/2020   IR ANGIO VERTEBRAL SEL SUBCLAVIAN INNOMINATE UNI L MOD SED  12/25/2018   IR ANGIO VERTEBRAL SEL SUBCLAVIAN INNOMINATE UNI L MOD SED  10/30/2019   IR ANGIO VERTEBRAL SEL SUBCLAVIAN INNOMINATE UNI L MOD SED  09/23/2020   IR ANGIO VERTEBRAL SEL SUBCLAVIAN INNOMINATE UNI R MOD SED  01/04/2019   IR ANGIO VERTEBRAL SEL VERTEBRAL UNI R MOD SED  12/25/2018   IR ANGIO VERTEBRAL SEL VERTEBRAL UNI R MOD SED  10/30/2019   IR ANGIO VERTEBRAL SEL VERTEBRAL UNI R MOD SED  09/23/2020   IR CT HEAD LTD  01/04/2019  IR CT HEAD LTD  11/21/2019   IR INTRA CRAN STENT  01/04/2019   IR INTRA CRAN STENT  11/21/2019   IR RADIOLOGIST EVAL & MGMT  03/12/2021   IR RADIOLOGIST EVAL & MGMT  09/10/2021   IR TRANSCATH EXCRAN VERT OR CAR A STENT  11/17/2020   IR US GUIDE VASC ACCESS RIGHT  12/25/2018   IR US GUIDE VASC ACCESS RIGHT  11/02/2019   OVARIAN CYST REMOVAL     RADIOLOGY WITH ANESTHESIA N/A 01/04/2019   Procedure: RMCA angioplasty with possible stenting;  Surgeon: Luanne Bras, MD;  Location: [facility];  Service: Radiology;  Laterality: N/A;   RADIOLOGY WITH ANESTHESIA N/A 11/21/2019   Procedure: IR WITH ANESTHESIA/ STENT PLACEMENT;  Surgeon: Luanne Bras, MD;  Location: [place];  Service: Radiology;  Laterality: N/A;   RADIOLOGY WITH ANESTHESIA N/A 10/01/2020   Procedure: IR WITH ANESTHESIA ANGIOPLASTY WITH  POSSIBLE STENTING;  Surgeon: Luanne Bras, MD;  Location: [place];  Service: Radiology;  Laterality: N/A;   RADIOLOGY WITH ANESTHESIA N/A 11/17/2020   Procedure: IR WITH ANESTHESIA STENT PLACEMENT;  Surgeon: Luanne Bras, MD;  Location: [place];  Service: Radiology;  Laterality: N/A;    Current Medications: Current Outpatient Medications on File Prior to Visit  Medication Sig   acetaminophen (TYLENOL) 325 MG tablet Take 325 mg by mouth every 6 (six) hours as needed for headache.    aspirin EC 81 MG EC tablet Take 1 tablet (81 mg total) by mouth daily.   atorvastatin (LIPITOR) 40 MG tablet Take 1 tablet (40 mg total) by mouth daily at 6 PM.   busPIRone (BUSPAR) 5 MG tablet Take 5 mg by mouth daily as needed (anxiety).    Calcium Carb-Cholecalciferol (CALCIUM 600 + D PO) Take 1,500 mg by mouth See admin instructions. Take 1 tablet after breakfast, 1 tablet after lunch, and 0.5 tablet after dinner   Carboxymethylcellulose Sodium (ARTIFICIAL TEARS OP) Place 1 drop into both eyes 3 (three) times daily as needed (dry eyes).    Coenzyme Q10 50 MG CAPS Take 50 mg by mouth daily.    levothyroxine (SYNTHROID) 75 MCG tablet Take 75 mcg by mouth every Tuesday, Thursday, Saturday, and Sunday.    levothyroxine (SYNTHROID, LEVOTHROID) 50 MCG tablet Take 50 mcg by mouth every Monday, Wednesday, and Friday.    loratadine (CLARITIN) 10 MG tablet Take 10 mg by mouth daily.   metoprolol succinate (TOPROL-XL) 25 MG 24 hr tablet Take 0.5 tablets (12.5 mg total) by mouth daily.   Multiple Vitamin (MULTIVITAMIN) capsule Take 1 capsule by mouth daily. Senior   Polyvinyl Alcohol-Povidone PF (REFRESH) 1.4-0.6 % SOLN Apply 1 drop to eye as needed (dry eye). Both eyes   Propylene Glycol (SYSTANE BALANCE OP) Apply 1 drop to eye as needed (dry eye). Both eyes.   ticagrelor (BRILINTA) 60 MG TABS tablet Take 0.5 tablets (30 mg total) by mouth 2 (two) times daily.   vitamin E 400 UNIT capsule Take 400 Units by mouth  every other day.    No current facility-administered medications on file prior to visit.     Allergies:   Elastic bandages & [zinc], Sulfa antibiotics, Tape, Mandol [cefamandole], and Sinutab non-drowsy max [pseudoephedrine-acetaminophen]   Social History   Tobacco Use   Smoking status: Never   Smokeless tobacco: Never  Vaping Use   Vaping Use: Never used  Substance Use Topics   Alcohol use: Never   Drug use: Never    Family History: family history includes CVA in  her maternal aunt and mother; Heart disease in her mother; Peripheral Artery Disease in her father.  ROS:   Please see the history of present illness.   (+) Lightheadedness (+) Imbalance (+) Cephalic pressure (+) Dyspnea (+) Pounding palpitations (+) Lower back pain (+) Fatigue (+) Dryness of mouth and throat causing dysphagia Additional pertinent ROS otherwise unremarkable.  EKGs/Labs/Other Studies Reviewed:    The following studies were reviewed today:  Coronary CT/Calcium Score 12/03/2021: Coronary Arteries:  Normal coronary origin.  Right dominance.   RCA is a large dominant artery that gives rise to PDA and PLA. There is noncalcified plaque in the proximal RCA causing 50-69% stenosis. Calcified plaque in the proximal RCA causes 0-24% stenosis   Left main is a large artery that gives rise to LAD and LCX arteries.   LAD is a large vessel. There is calcified plaque in the proximal LAD causing 0-24% stenosis   LCX is a non-dominant artery that gives rise to one large OM1 branch. There is calcified plaque in the proximal LCX causing 0-24% stenosis   Other findings:   Left Ventricle: Normal size   Left Atrium: Mild enlargement. PFO   Pulmonary Veins: Normal configuration   Right Ventricle: Normal size   Right Atrium: Normal size   Cardiac valves: No calcifications   Thoracic aorta: Normal size   Pulmonary Arteries: Normal size   Systemic Veins: Normal drainage   Pericardium: Normal  thickness   IMPRESSION: 1. Coronary calcium score of 150. This was 67th percentile for age and sex matched control.   2.  Normal coronary origin with right dominance.   3. Noncalcified plaque in the proximal RCA causes moderate (50-69%) stenosis   4. Calcified plaque in the proximal RCA, proximal LAD, and proximal LCX causes minimal (0-24%) stenosis   5.  PFO   6.  Will send for CT FFR   CAD-RADS 3. Moderate stenosis. Consider symptom-guided anti-ischemic pharmacotherapy as well as risk factor modification per guideline directed care. Additional analysis with CT FFR will be submitted.  ETT 02/26/21: Blood pressure demonstrated a normal response to exercise. Upsloping ST segment depression ST segment depression of 1 mm was noted during stress in the V5, V6, III and aVF leads.   ETT with fair exercise tolerance (6:19); no chest pain; normal blood pressure response; study terminated secondary to dyspnea; 1 mm of upsloping to horizontal ST depression in the inferior lateral leads of borderline significance; patient achieved 74% (inadequate) predicted maximum heart rate; suggest alternative study if clinical suspicion of ischemia high (nuclear study or cardiac CTA).   Monitor 07/31/20 7 days of data recorded on Zio monitor. Patient had a min HR of 50 bpm, max HR of 158 bpm, and avg HR of 85 bpm. Predominant underlying rhythm was Sinus Rhythm. No VT, atrial fibrillation, high degree block, or pauses noted. Isolated atrial and ventricular ectopy was rare (<1%). 524 Supraventricular Tachycardia runs occurred, the run with the fastest interval lasting 6 beats with a max rate of 158 bpm, the longest lasting 19 beats with an avg rate of 104 bpm.   Echo 12/09/2018 - Left ventricle: The cavity size was normal. Systolic function was   normal. The estimated ejection fraction was in the range of 60%   to 65%. Wall motion was normal; there were no regional wall   motion abnormalities. Left  ventricular diastolic function   parameters were normal. - Left atrium: The atrium was normal in size. - Right ventricle: Systolic function was normal. -  Atrial septum: No defect or patent foramen ovale was identified.   Echo contrast study showed no right-to-left atrial level shunt,   at baseline or with provocation. - Pulmonary arteries: Systolic pressure was within the normal   range.  ETT 09/04/2018 ([facility]) Baseline HR bpm 70   Baseline BP mmHg 186/83   Peak HR bpm 121   PMHR % % 82   Peak BP mmHg 213/80   Exercise duration (min) min 9   Exercise duration (sec) sec 0   Stage Reached  3   Estimated workload METS 10.1   ST Deviation mm 0.0   Duke Treadmill Score  5.0   Angina Score for [facility] Treadmill Score  1    Narrative    Minor non-specific ST and T wave changes noted during stress or recovery   Overall, the patient's exercise capacity was excellent.   Negative ETT for ischemia at workload achieved   The patient experienced atypical chest pain during the stress test.   No significant arrhythmia noted   Reassurance.  BP control important.   Cardiac cath 04/2012 [facility]) Per note 07/17/2015  Carotid doppler 08/16/2014  Normal Duplex Doppler of Carotids   Cardiovascular stress test 04/2012  NL LVEF; inferior ischemia   Cardiac catheterization 04/2012  Nonobstructive 30% RCA disease otherwise NL and EF 65%  EKG:  Personally reviewed today 01/11/2022: EKG was not ordered. 4/22- EKG was not ordered 04/09/20- NSR with sinus arrhythmia  Recent Labs: 10/28/2021: BUN 16; Creatinine, Ser 0.62; Hemoglobin 14.3; Magnesium 2.4; Platelets 176; Potassium 4.6; Sodium 138; TSH 0.773   Recent Lipid Panel    Component Value Date/Time   CHOL 199 12/09/2018 0352   TRIG 110 12/09/2018 0352   HDL 45 12/09/2018 0352   CHOLHDL 4.4 12/09/2018 0352   VLDL 22 12/09/2018 0352   LDLCALC 132 (H) 12/09/2018 0352    Physical Exam:    VS:  BP 135/68    Pulse 64    Ht 5\' 5"  (1.651 m)     Wt 118 lb 4.8 oz (53.7 kg)    SpO2 99%    BMI 19.69 kg/m     Wt Readings from Last 3 Encounters:  01/11/22 118 lb 4.8 oz (53.7 kg)  12/21/21 117 lb 14.4 oz (53.5 kg)  10/28/21 117 lb 6.4 oz (53.3 kg)   GEN: Well nourished, well developed in no acute distress HEENT: Normal, moist mucous membranes NECK: No JVD CARDIAC: regular rhythm, normal S1 and S2, no rubs or gallops. No murmur. VASCULAR: Radial and DP pulses 2+ bilaterally. No carotid bruits RESPIRATORY:  Clear to auscultation without rales, wheezing or rhonchi  ABDOMEN: Soft, non-tender, non-distended MUSCULOSKELETAL:  Ambulates independently SKIN: Warm and dry, no edema NEUROLOGIC:  Alert and oriented x 3. No focal neuro deficits noted. PSYCHIATRIC:  Normal affect    ASSESSMENT:    1. Lightheadedness   2. Heart palpitations   3. Nonocclusive coronary atherosclerosis of native coronary artery   4. Chest pain, unspecified type   5. PSVT (paroxysmal supraventricular tachycardia) ([place])   6. History of CVA (cerebrovascular accident)   7. Essential hypertension      PLAN:    Nonspecific chest pain, fatigue Nonobstructive CAD -CCTA above, Ca score 150, pRCA with moderate stenosis and FFR of 0.8 -already on aspirin, ticagrelor, atorvastatin due to history of CVA -her symptoms are not clearly exertional. Did not tolerate imdur. On metoprolol -cautioned on red flag warning signs that need immediate medical attention  PFO -incidental on  CT. Negative bubble study on prior echo. -history of stroke, but due to ischemia/stenosis  Palpitations, tachycardia: -monitor shows paroxysmal SVT  Hypertension: labile readings -typically higher in office, use home numbers for medication adjustments -was running symptomatically low at home, better with cessation of lisinopril and amlodipine -did not tolerate diltiazem for pSVT, changed to metoprolol   History of CVA, with need for secondary prevention: -on aspirin, ticagrelor.  Ticagrelor duration per interventional neurology -on high intensity atorvastatin -echo 12/09/18 was normal, negative bubble study  Cardiac risk counseling and prevention recommendations: -recommend heart healthy/Mediterranean diet, with whole grains, fruits, vegetable, fish, lean meats, nuts, and olive oil. Limit salt. -recommend moderate walking, 3-5 times/week for 30-50 minutes each session. Aim for at least 150 minutes.week. Goal should be pace of 3 miles/hours, or walking 1.5 miles in 30 minutes -recommend avoidance of tobacco products. Avoid excess alcohol.   Plan for follow up: 6 months or sooner as needed  Medication Adjustments/Labs and Tests Ordered: Current medicines are reviewed at length with the patient today.  Concerns regarding medicines are outlined above.   No orders of the defined types were placed in this encounter.  No orders of the defined types were placed in this encounter.  Patient Instructions  Medication Instructions:  Your Physician recommend you continue on your current medication as directed.    *If you need a refill on your cardiac medications before your next appointment, please call your pharmacy*   Lab Work: None ordered today   Testing/Procedures: None ordered today   Follow-Up: At [facility], you and your health needs are our priority.  As part of our continuing mission to provide you with exceptional heart care, we have created designated Provider Care Teams.  These Care Teams include your primary Cardiologist (physician) and Advanced Practice Providers (APPs -  Physician Assistants and Nurse Practitioners) who all work together to provide you with the care you need, when you need it.  We recommend signing up for the patient portal called "MyChart".  Sign up information is provided on this After Visit Summary.  MyChart is used to connect with patients for Virtual Visits (Telemedicine).  Patients are able to view lab/test results, encounter  notes, upcoming appointments, etc.  Non-urgent messages can be sent to your provider as well.   To learn more about what you can do with MyChart, go to [email or web address].    Your next appointment:   6 month(s)  The format for your next appointment:   In Person  Provider:   Buford Dresser, MD      [facility],acting as a scribe for Buford Dresser, MD.,have documented all relevant documentation on the behalf of Buford Dresser, MD,as directed by  Buford Dresser, MD while in the presence of Buford Dresser, MD.  I, Buford Dresser, MD, have reviewed all documentation for this visit. The documentation on 01/12/22 for the exam, diagnosis, procedures, and orders are all accurate and complete.    Signed, Buford Dresser, MD PhD 01/12/2022    [place]

## 2022-01-11 NOTE — Patient Instructions (Signed)

## 2022-01-12 ENCOUNTER — Encounter (HOSPITAL_BASED_OUTPATIENT_CLINIC_OR_DEPARTMENT_OTHER): Payer: Self-pay | Admitting: Cardiology

## 2022-01-12 DIAGNOSIS — I2510 Atherosclerotic heart disease of native coronary artery without angina pectoris: Secondary | ICD-10-CM | POA: Insufficient documentation

## 2022-01-21 ENCOUNTER — Other Ambulatory Visit: Payer: Self-pay | Admitting: Cardiology

## 2022-01-21 NOTE — Telephone Encounter (Signed)
Rx(s) sent to pharmacy electronically.  

## 2022-01-26 ENCOUNTER — Other Ambulatory Visit: Payer: Self-pay | Admitting: Family Medicine

## 2022-01-26 DIAGNOSIS — Z1231 Encounter for screening mammogram for malignant neoplasm of breast: Secondary | ICD-10-CM

## 2022-02-10 ENCOUNTER — Ambulatory Visit
Admission: RE | Admit: 2022-02-10 | Discharge: 2022-02-10 | Disposition: A | Discharge status: Home / Self Care | Payer: Medicare Other | Source: Ambulatory Visit | Attending: Family Medicine | Admitting: Family Medicine

## 2022-02-10 DIAGNOSIS — Z1231 Encounter for screening mammogram for malignant neoplasm of breast: Secondary | ICD-10-CM

## 2022-03-09 ENCOUNTER — Encounter (HOSPITAL_BASED_OUTPATIENT_CLINIC_OR_DEPARTMENT_OTHER): Payer: Self-pay | Admitting: Cardiology

## 2022-05-13 ENCOUNTER — Telehealth: Payer: Self-pay | Admitting: Cardiology

## 2022-05-13 NOTE — Telephone Encounter (Signed)
Pt c/o of Chest Pain: STAT if CP now or developed within 24 hours  1. Are you having CP right now? no  2. Are you experiencing any other symptoms (ex. SOB, nausea, vomiting, sweating)? She has trouble breathing when she has the chest pressure, it feels like her clothes are tight on her but she doesn't wear tight clothes.   3. How long have you been experiencing CP? Since May 22nd   4. Is your CP continuous or coming and going? Comes and goes.   5. Have you taken Nitroglycerin? No

## 2022-05-13 NOTE — Telephone Encounter (Signed)
RN returned call to patient to address concerns from phone note. Patient says she is not having chest pain, but having a chest tightness that makes her feel like she cannot breath. This started on May 22nd. Happens at rest and when up with activity. Patient endorses that it lasts anywhere from 2-3 hours. Patient has not seen an ED due to being out of town. Patient states she does not want to go to the ED.    Offered patient appointments with Dr. Harrell Gave but those did not work for her, patient scheduled with Laurann Montana, NP 6/8

## 2022-05-20 ENCOUNTER — Ambulatory Visit (INDEPENDENT_AMBULATORY_CARE_PROVIDER_SITE_OTHER): Payer: Medicare Other | Admitting: Family

## 2022-05-20 ENCOUNTER — Encounter (HOSPITAL_BASED_OUTPATIENT_CLINIC_OR_DEPARTMENT_OTHER): Payer: Self-pay | Admitting: Family

## 2022-05-20 VITALS — BP 132/82 | HR 76 | Ht 65.0 in | Wt 117.0 lb

## 2022-05-20 DIAGNOSIS — E785 Hyperlipidemia, unspecified: Secondary | ICD-10-CM | POA: Diagnosis not present

## 2022-05-20 DIAGNOSIS — I10 Essential (primary) hypertension: Secondary | ICD-10-CM | POA: Diagnosis not present

## 2022-05-20 DIAGNOSIS — Q2112 Patent foramen ovale: Secondary | ICD-10-CM

## 2022-05-20 DIAGNOSIS — Z8673 Personal history of transient ischemic attack (TIA), and cerebral infarction without residual deficits: Secondary | ICD-10-CM | POA: Diagnosis not present

## 2022-05-20 DIAGNOSIS — I25118 Atherosclerotic heart disease of native coronary artery with other forms of angina pectoris: Secondary | ICD-10-CM | POA: Diagnosis not present

## 2022-05-20 MED ORDER — NITROGLYCERIN 0.4 MG SL SUBL: 0.4000 mg | SUBLINGUAL_TABLET | SUBLINGUAL | 3 refills | Status: DC | PRN

## 2022-05-20 NOTE — Progress Notes (Signed)
Office Visit    Patient Name: Shannon [name] Date of Encounter: 05/20/2022  PCP:  Donalynn Furlong, Fernando Salinas  Cardiologist:  Buford Dresser, MD  Advanced Practice Provider:  No care team member to display Electrophysiologist:  None    Chief Complaint    Shannon [name] is a 76 y.o. 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   Arthritis    Cancer ([place])    skin - squamous cell   Complication of anesthesia    " I had difficulty coming out of it"   Dyspnea    with exertion   Family history of adverse reaction to anesthesia    " My daughter has a problem coming ou to it"   GERD (gastroesophageal reflux disease)    Headache    migraines in the past   Hypertension    Hypothyroidism    Stroke [facility]) 11/2018   some weakness on right side    SVT (supraventricular tachycardia) ([place])    Thyroid disease    Past Surgical History:  Procedure Laterality Date   ABDOMINAL HYSTERECTOMY     APPENDECTOMY     CARDIAC CATHETERIZATION     CARPAL TUNNEL RELEASE Bilateral    COLONOSCOPY     DILATION AND CURETTAGE OF UTERUS     IR ANGIO INTRA EXTRACRAN SEL COM CAROTID INNOMINATE BILAT MOD SED  12/25/2018   IR ANGIO INTRA EXTRACRAN SEL COM CAROTID INNOMINATE BILAT MOD SED  10/30/2019   IR ANGIO INTRA EXTRACRAN SEL COM CAROTID INNOMINATE BILAT MOD SED  09/23/2020   IR ANGIO VERTEBRAL SEL SUBCLAVIAN INNOMINATE UNI L MOD SED  12/25/2018   IR ANGIO VERTEBRAL SEL SUBCLAVIAN INNOMINATE UNI L MOD SED  10/30/2019   IR ANGIO VERTEBRAL SEL SUBCLAVIAN INNOMINATE UNI L MOD SED  09/23/2020   IR ANGIO VERTEBRAL SEL SUBCLAVIAN INNOMINATE UNI R MOD SED  01/04/2019   IR ANGIO VERTEBRAL SEL VERTEBRAL UNI R MOD SED  12/25/2018   IR ANGIO VERTEBRAL SEL VERTEBRAL UNI R MOD SED  10/30/2019   IR ANGIO  VERTEBRAL SEL VERTEBRAL UNI R MOD SED  09/23/2020   IR CT HEAD LTD  01/04/2019   IR CT HEAD LTD  11/21/2019   IR INTRA CRAN STENT  01/04/2019   IR INTRA CRAN STENT  11/21/2019   IR RADIOLOGIST EVAL & MGMT  03/12/2021   IR RADIOLOGIST EVAL & MGMT  09/10/2021   IR TRANSCATH EXCRAN VERT OR CAR A STENT  11/17/2020   IR US GUIDE VASC ACCESS RIGHT  12/25/2018   IR US GUIDE VASC ACCESS RIGHT  11/02/2019   OVARIAN CYST REMOVAL     RADIOLOGY WITH ANESTHESIA N/A 01/04/2019   Procedure: RMCA angioplasty with possible stenting;  Surgeon: Luanne Bras, MD;  Location: [place];  Service: Radiology;  Laterality: N/A;   RADIOLOGY WITH ANESTHESIA N/A 11/21/2019   Procedure: IR WITH ANESTHESIA/ STENT PLACEMENT;  Surgeon: Luanne Bras, MD;  Location: [place];  Service: Radiology;  Laterality: N/A;   RADIOLOGY WITH ANESTHESIA N/A 10/01/2020   Procedure: IR WITH ANESTHESIA ANGIOPLASTY WITH POSSIBLE STENTING;  Surgeon: Luanne Bras, MD;  Location: [place];  Service: Radiology;  Laterality: N/A;   RADIOLOGY WITH ANESTHESIA N/A 11/17/2020   Procedure: IR WITH ANESTHESIA  STENT PLACEMENT;  Surgeon: Luanne Bras, MD;  Location: [place];  Service: Radiology;  Laterality: N/A;    Allergies  Allergies  Allergen Reactions   Elastic Bandages & [Zinc] Itching   Sulfa Antibiotics Other (See Comments)    Tongue turned black   Tape     Pulled blood through the skin- happened during a procedure in Oct 2021    Mandol [Cefamandole] Rash   Sinutab Non-Drowsy Max [Pseudoephedrine-Acetaminophen] Palpitations    History of Present Illness    Shannon [name] is a 76 y.o. female with a hx of hypertension, CVA, CAD last seen 01/11/22  Prior cardiac cath (date unknown) with reported 30% RCA lesion. Prior echo 2019 with normal LVEF, negative bubble study. Previous ETT 02/26/21 with fair exercise tolerance, 91m up-sloping to horizontal ST depressions in inferior lateral leads of borderline significance.  Due to chest pain  cardiac CTA performed 12/03/2021 with coronary calcium score 150 placing her in the 67th percentile for age and sex matched control.  She had noncalcified plaques in the proximal RCA with moderate 50 to 69% stenosis, calcified plaque in the proximal RCA, LAD, proximal LCx with 0 to 24% stenosis.  Noted PFO.  FFR on RCA of 0.8 suggesting borderline likelihood of hemodynamic significance.  She was started on Imdur but did not tolerate.  Previously did not tolerate increased doses of metoprolol.  She was last seen 12/2021 by Dr. CHarrell Gave  She noted no chest pain.  She noted only mild swelling in her legs at the end of the day if she was on her feet most of the day.  She does note an episode of lightheadedness at which time her blood pressure was 122/68.No changes were made at that time.  She presents today for follow-up independently for chest pain.  Tells me her chest pain started 05/03/22 and felt like a heaviness in her chest after walking 1.95 miles. Her blood pressure was 118/62 and HR 65. Later that morning she felt dizzy, tired. Repeat BP 139/66 and pulse 61 bpm.  [place] and spent the month of May there.  Usually she can walk in the mornings without difficulty but continues to have episodes of chest pain. Later that week on 05/05/22 felt her heart was racing associated of chest pain and shortness of breath. A few days later could not complete her morning walk. Notes her blood pressure has been labile. Most of her symptoms of chest discomfort, fatigue, shortness of breath have occurred while walking and resolved with rest.   EKGs/Labs/Other Studies Reviewed:   The following studies were reviewed today:  Monitor 08/19/20 7 days of data recorded on Zio monitor. Patient had a min HR of 50 bpm, max HR of 158 bpm, and avg HR of 85 bpm. Predominant underlying rhythm was Sinus Rhythm. No VT, atrial fibrillation, high degree block, or  pauses noted. Isolated atrial and ventricular ectopy was rare (<1%). 524 Supraventricular Tachycardia runs occurred, the run with the fastest interval lasting 6 beats with a max rate of 158 bpm, the longest lasting 19 beats with an avg rate of 104 bpm.   ETT 02/26/21 Blood pressure demonstrated a normal response to exercise. Upsloping ST segment depression ST segment depression of 1 mm was noted during stress in the V5, V6, III and aVF leads.   ETT with fair exercise tolerance (6:19); no chest pain; normal blood pressure response; study terminated secondary to dyspnea; 1 mm  of upsloping to horizontal ST depression in the inferior lateral leads of borderline significance; patient achieved 74% (inadequate) predicted maximum heart rate; suggest alternative study if clinical suspicion of ischemia high (nuclear study or cardiac CTA).   Coronary CT/Calcium Score 12/03/2021: Coronary Arteries:  Normal coronary origin.  Right dominance.   RCA is a large dominant artery that gives rise to PDA and PLA. There is noncalcified plaque in the proximal RCA causing 50-69% stenosis. Calcified plaque in the proximal RCA causes 0-24% stenosis   Left main is a large artery that gives rise to LAD and LCX arteries.   LAD is a large vessel. There is calcified plaque in the proximal LAD causing 0-24% stenosis   LCX is a non-dominant artery that gives rise to one large OM1 branch. There is calcified plaque in the proximal LCX causing 0-24% stenosis   Other findings:   Left Ventricle: Normal size   Left Atrium: Mild enlargement. PFO   Pulmonary Veins: Normal configuration   Right Ventricle: Normal size   Right Atrium: Normal size   Cardiac valves: No calcifications   Thoracic aorta: Normal size   Pulmonary Arteries: Normal size   Systemic Veins: Normal drainage   Pericardium: Normal thickness   IMPRESSION: 1. Coronary calcium score of 150. This was 67th percentile for age and sex matched  control.   2.  Normal coronary origin with right dominance.   3. Noncalcified plaque in the proximal RCA causes moderate (50-69%) stenosis   4. Calcified plaque in the proximal RCA, proximal LAD, and proximal LCX causes minimal (0-24%) stenosis   5.  PFO   6.  Will send for CT FFR   CAD-RADS 3. Moderate stenosis. Consider symptom-guided anti-ischemic pharmacotherapy as well as risk factor modification per guideline directed care. Additional analysis with CT FFR will be submitted.    EKG:  EKG is ordered today.  The ekg ordered today demonstrates normal sinus and 76 bpm with no acute ST/T wave changes.  Recent Labs: 10/28/2021: BUN 16; Creatinine, Ser 0.62; Hemoglobin 14.3; Magnesium 2.4; Platelets 176; Potassium 4.6; Sodium 138; TSH 0.773  Recent Lipid Panel    Component Value Date/Time   CHOL 199 12/09/2018 0352   TRIG 110 12/09/2018 0352   HDL 45 12/09/2018 0352   CHOLHDL 4.4 12/09/2018 0352   VLDL 22 12/09/2018 0352   LDLCALC 132 (H) 12/09/2018 0352   Home Medications   Current Meds  Medication Sig   acetaminophen (TYLENOL) 325 MG tablet Take 325 mg by mouth every 6 (six) hours as needed for headache.    aspirin EC 81 MG EC tablet Take 1 tablet (81 mg total) by mouth daily.   atorvastatin (LIPITOR) 40 MG tablet Take 1 tablet (40 mg total) by mouth daily at 6 PM.   Calcium Carb-Cholecalciferol (CALCIUM 600 + D PO) Take 1,500 mg by mouth See admin instructions. Take 1 tablet after breakfast, 1 tablet after lunch, and 0.5 tablet after dinner   Carboxymethylcellulose Sodium (ARTIFICIAL TEARS OP) Place 1 drop into both eyes 3 (three) times daily as needed (dry eyes).    Coenzyme Q10 50 MG CAPS Take 50 mg by mouth daily.    levothyroxine (SYNTHROID) 75 MCG tablet Take 75 mcg by mouth every Tuesday, Thursday, Saturday, and Sunday.    levothyroxine (SYNTHROID, LEVOTHROID) 50 MCG tablet Take 50 mcg by mouth every Monday, Wednesday, and Friday.    loratadine (CLARITIN) 10 MG  tablet Take 10 mg by mouth daily.   metoprolol succinate (TOPROL-XL) 25  MG 24 hr tablet Take 0.5 tablets (12.5 mg total) by mouth daily.   Multiple Vitamin (MULTIVITAMIN) capsule Take 1 capsule by mouth daily. Senior   Polyvinyl Alcohol-Povidone PF (REFRESH) 1.4-0.6 % SOLN Apply 1 drop to eye as needed (dry eye). Both eyes   Propylene Glycol (SYSTANE BALANCE OP) Apply 1 drop to eye as needed (dry eye). Both eyes.   ticagrelor (BRILINTA) 60 MG TABS tablet Take 0.5 tablets (30 mg total) by mouth 2 (two) times daily.   vitamin E 400 UNIT capsule Take 400 Units by mouth every other day.      Review of Systems      All other systems reviewed and are otherwise negative except as noted above.  Physical Exam    VS:  BP 132/82   Pulse 76   Ht '5\' 5"'$  (1.651 m)   Wt 117 lb (53.1 kg)   BMI 19.47 kg/m  , BMI Body mass index is 19.47 kg/m.  Wt Readings from Last 3 Encounters:  05/20/22 117 lb (53.1 kg)  01/11/22 118 lb 4.8 oz (53.7 kg)  12/21/21 117 lb 14.4 oz (53.5 kg)   GEN: Well nourished, well developed, in no acute distress. HEENT: normal. Neck: Supple, no JVD, carotid bruits, or masses. Cardiac: RRR, no murmurs, rubs, or gallops. No clubbing, cyanosis, edema.  Radials/PT 2+ and equal bilaterally.  Respiratory:  Respirations regular and unlabored, clear to auscultation bilaterally. GI: Soft, nontender, nondistended. MS: No deformity or atrophy. Skin: Warm and dry, no rash. Neuro:  Strength and sensation are intact. Anxious.  Psych: Normal affect.  Assessment & Plan    Coronary artery disease - Reports exertional dyspnea as well as chest pain with onset 05/03/22 with activity such as walking outside her camper that used to not cause any symptoms. EKG today no acute St/T wave changes. Previous cath 2013 with 30% RCA stenosis. Cardiac CT 11/2021 with pRCA with moderate stenosis and FFR 0.8. Continue aspirin, brilinta, atorvastatin. Plan for cardiac catheterization.   Shared Decision  Making/Informed Consent The risks [stroke (1 in 1000), death (1 in 1000), kidney failure [usually temporary] (1 in 500), bleeding (1 in 200), allergic reaction [possibly serious] (1 in 200)], benefits (diagnostic support and management of coronary artery disease) and alternatives of a cardiac catheterization were discussed in detail with Shannon [name] and she is willing to proceed.   Palpitations / Tachycardia / Bradycardia / Sinus pause 1.68 sec - Previous monitor with paroxymal SVT. Continue Toprol 12.'5mg'$  QD. Did not tolerate increased doses nor Diltiazem. Continue to stay well hydrated, avoid caffeine, manage stress well.  PFO -incidental finding on CT.  Negative bubble study on prior echo.  History of stroke but was due to ischemia/stenosis. No indication for closure.   HTN - Labile blood pressure at home. Per Dr. Harrell Gave base antihypertensive regimen on home readings as element of Stout coat hypertension. BP reasonably well controlled in clinic - continue current regimen.   Hypothyroidism - Managed by primary care.   History of CVA - On Aspirin, Brilinta per interventional neurology. Follows with neurology.   Disposition: Follow up 2 weeks after cardiac cath with Buford Dresser, MD or APP.  Signed, Loel Dubonnet, NP 05/20/2022, 10:58 AM [place]

## 2022-05-20 NOTE — Patient Instructions (Signed)
Medication Instructions:  Your physician has recommended you make the following change in your medication:    START nitroglycerin as needed for chest pain   For as needed Nitroglycerin, if you develop chest pain: Sit and rest 5 minutes. If chest pain does not resolve place 1 nitroglycerin under your tongue and wait 5 minutes. If chest pain does not resolve, place a 2nd nitroglycerin under your tongue and wait 5 more minutes. If chest pain does not resolve, place a 3rd nitroglycerin under your tongue and seek emergency services.   *If you need a refill on your cardiac medications before your next appointment, please call your pharmacy*  Lab Work: Your physician recommends that you return for lab work one week prior to cardiac catheterization for BMP, CBC June 12th   Please return for Lab work. You may come to the...   [facility] (3rd floor) [address], [place], [place] [address]  Open: 8am-Noon and 1pm-4:30pm  Please ring the doorbell on the small table when you exit the elevator and the Lab Tech will come get you  [place] at [facility] [address] [place], [place], [place] [address] Open: 8am-1pm, then 2pm-4:30pm   [place]- Please see attached locations sheet stapled to your lab work with address and hours.    If you have labs (blood work) drawn today and your tests are completely normal, you will receive your results only by: [place] (if you have MyChart) OR A paper copy in the mail If you have any lab test that is abnormal or we need to change your treatment, we will call you to review the results.   Testing/Procedures: Your physician has requested that you have a cardiac catheterization. Cardiac catheterization is used to diagnose and/or treat various heart conditions. Doctors may recommend this procedure for a number of different reasons. The most common reason is to evaluate chest pain. Chest pain can be a symptom of  coronary artery disease (CAD), and cardiac catheterization can show whether plaque is narrowing or blocking your heart's arteries. This procedure is also used to evaluate the valves, as well as measure the blood flow and oxygen levels in different parts of your heart. Please follow instruction sheet, as given.   Follow-Up: At [facility], you and your health needs are our priority.  As part of our continuing mission to provide you with exceptional heart care, we have created designated Provider Care Teams.  These Care Teams include your primary Cardiologist (physician) and Advanced Practice Providers (APPs -  Physician Assistants and Nurse Practitioners) who all work together to provide you with the care you need, when you need it.  We recommend signing up for the patient portal called "MyChart".  Sign up information is provided on this After Visit Summary.  MyChart is used to connect with patients for Virtual Visits (Telemedicine).  Patients are able to view lab/test results, encounter notes, upcoming appointments, etc.  Non-urgent messages can be sent to your provider as well.   To learn more about what you can do with MyChart, go to [email or web address].    Your next appointment:   2 weeks after your cardiac catheterization  The format for your next appointment:   In Person  Provider:   Buford Dresser, MD    Other Instructions You are scheduled for a Cardiac Catheterization on Monday, June 19 with Dr. Beau Fanny   1. Please arrive at the Main Entrance A at [facility]: [address]  [place], [place] [address] at 5:30 AM (This time is two hours before your procedure to ensure your preparation). Free valet parking service is available.   Special note: Every effort is made to have your procedure done on time. Please understand that emergencies sometimes delay scheduled procedures.  2. Diet: Do not eat solid foods after midnight.  You may have clear liquids until 5 AM upon  the day of the procedure.  3. Labs: Please return 1 week prior to cath June 12th. You may come to the...   [facility] (3rd floor) [address], [place], [place] [address]  Open: 8am-Noon and 1pm-4:30pm  Please ring the doorbell on the small table when you exit the elevator and the Lab Tech will come get you  [place] at [facility] [address] [place], [place],  [address] Open: 8am-1pm, then 2pm-4:30pm   [place]- Please see attached locations sheet stapled to your lab work with address and hours.    4. Please take all your normal morning medications the morning of your cath.   On the morning of your procedure, take Aspirin and any morning medicines NOT listed above.  You may use sips of water.  5. Plan to go home the same day, you will only stay overnight if medically necessary. 6. You MUST have a responsible adult to drive you home. 7. An adult MUST be with you the first 24 hours after you arrive home. 8. Bring a current list of your medications, and the last time and date medication taken. 9. Bring ID and current insurance cards. 10.Please wear clothes that are easy to get on and off and wear slip-on shoes.  Thank you for allowing [place] to care for you!   -- [place] [facility]

## 2022-05-20 NOTE — H&P (View-Only) (Signed)
Office Visit    Patient Name: Shannon [name] Date of Encounter: 05/20/2022  PCP:  Donalynn Furlong, [place]  Cardiologist:  Buford Dresser, MD  Advanced Practice Provider:  No care team member to display Electrophysiologist:  None    Chief Complaint    Shannon [name] is a 76 y.o. 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   Arthritis    Cancer ([place])    skin - squamous cell   Complication of anesthesia    " I had difficulty coming out of it"   Dyspnea    with exertion   Family history of adverse reaction to anesthesia    " My daughter has a problem coming ou to it"   GERD (gastroesophageal reflux disease)    Headache    migraines in the past   Hypertension    Hypothyroidism    Stroke [facility]) 11/2018   some weakness on right side    SVT (supraventricular tachycardia) ([place])    Thyroid disease    Past Surgical History:  Procedure Laterality Date   ABDOMINAL HYSTERECTOMY     APPENDECTOMY     CARDIAC CATHETERIZATION     CARPAL TUNNEL RELEASE Bilateral    COLONOSCOPY     DILATION AND CURETTAGE OF UTERUS     IR ANGIO INTRA EXTRACRAN SEL COM CAROTID INNOMINATE BILAT MOD SED  12/25/2018   IR ANGIO INTRA EXTRACRAN SEL COM CAROTID INNOMINATE BILAT MOD SED  10/30/2019   IR ANGIO INTRA EXTRACRAN SEL COM CAROTID INNOMINATE BILAT MOD SED  09/23/2020   IR ANGIO VERTEBRAL SEL SUBCLAVIAN INNOMINATE UNI L MOD SED  12/25/2018   IR ANGIO VERTEBRAL SEL SUBCLAVIAN INNOMINATE UNI L MOD SED  10/30/2019   IR ANGIO VERTEBRAL SEL SUBCLAVIAN INNOMINATE UNI L MOD SED  09/23/2020   IR ANGIO VERTEBRAL SEL SUBCLAVIAN INNOMINATE UNI R MOD SED  01/04/2019   IR ANGIO VERTEBRAL SEL VERTEBRAL UNI R MOD SED  12/25/2018   IR ANGIO VERTEBRAL SEL VERTEBRAL UNI R MOD SED  10/30/2019   IR ANGIO  VERTEBRAL SEL VERTEBRAL UNI R MOD SED  09/23/2020   IR CT HEAD LTD  01/04/2019   IR CT HEAD LTD  11/21/2019   IR INTRA CRAN STENT  01/04/2019   IR INTRA CRAN STENT  11/21/2019   IR RADIOLOGIST EVAL & MGMT  03/12/2021   IR RADIOLOGIST EVAL & MGMT  09/10/2021   IR TRANSCATH EXCRAN VERT OR CAR A STENT  11/17/2020   IR US GUIDE VASC ACCESS RIGHT  12/25/2018   IR US GUIDE VASC ACCESS RIGHT  11/02/2019   OVARIAN CYST REMOVAL     RADIOLOGY WITH ANESTHESIA N/A 01/04/2019   Procedure: RMCA angioplasty with possible stenting;  Surgeon: Luanne Bras, MD;  Location: [place];  Service: Radiology;  Laterality: N/A;   RADIOLOGY WITH ANESTHESIA N/A 11/21/2019   Procedure: IR WITH ANESTHESIA/ STENT PLACEMENT;  Surgeon: Luanne Bras, MD;  Location: [place];  Service: Radiology;  Laterality: N/A;   RADIOLOGY WITH ANESTHESIA N/A 10/01/2020   Procedure: IR WITH ANESTHESIA ANGIOPLASTY WITH POSSIBLE STENTING;  Surgeon: Luanne Bras, MD;  Location: [place];  Service: Radiology;  Laterality: N/A;   RADIOLOGY WITH ANESTHESIA N/A 11/17/2020   Procedure: IR WITH ANESTHESIA  STENT PLACEMENT;  Surgeon: Luanne Bras, MD;  Location: [place];  Service: Radiology;  Laterality: N/A;    Allergies  Allergies  Allergen Reactions   Elastic Bandages & [Zinc] Itching   Sulfa Antibiotics Other (See Comments)    Tongue turned black   Tape     Pulled blood through the skin- happened during a procedure in Oct 2021    Mandol [Cefamandole] Rash   Sinutab Non-Drowsy Max [Pseudoephedrine-Acetaminophen] Palpitations    History of Present Illness    Shannon [name] is a 76 y.o. female with a hx of hypertension, CVA, CAD last seen 01/11/22  Prior cardiac cath (date unknown) with reported 30% RCA lesion. Prior echo 2019 with normal LVEF, negative bubble study. Previous ETT 02/26/21 with fair exercise tolerance, 75m up-sloping to horizontal ST depressions in inferior lateral leads of borderline significance.  Due to chest pain  cardiac CTA performed 12/03/2021 with coronary calcium score 150 placing her in the 67th percentile for age and sex matched control.  She had noncalcified plaques in the proximal RCA with moderate 50 to 69% stenosis, calcified plaque in the proximal RCA, LAD, proximal LCx with 0 to 24% stenosis.  Noted PFO.  FFR on RCA of 0.8 suggesting borderline likelihood of hemodynamic significance.  She was started on Imdur but did not tolerate.  Previously did not tolerate increased doses of metoprolol.  She was last seen 12/2021 by Dr. CHarrell Gave  She noted no chest pain.  She noted only mild swelling in her legs at the end of the day if she was on her feet most of the day.  She does note an episode of lightheadedness at which time her blood pressure was 122/68.No changes were made at that time.  She presents today for follow-up independently for chest pain.  Tells me her chest pain started 05/03/22 and felt like a heaviness in her chest after walking 1.95 miles. Her blood pressure was 118/62 and HR 65. Later that morning she felt dizzy, tired. Repeat BP 139/66 and pulse 61 bpm.  She [place] and spent the month of May there.  Usually she can walk in the mornings without difficulty but continues to have episodes of chest pain. Later that week on 05/05/22 felt her heart was racing associated of chest pain and shortness of breath. A few days later could not complete her morning walk. Notes her blood pressure has been labile. Most of her symptoms of chest discomfort, fatigue, shortness of breath have occurred while walking and resolved with rest.   EKGs/Labs/Other Studies Reviewed:   The following studies were reviewed today:  Monitor 08/19/20 7 days of data recorded on Zio monitor. Patient had a min HR of 50 bpm, max HR of 158 bpm, and avg HR of 85 bpm. Predominant underlying rhythm was Sinus Rhythm. No VT, atrial fibrillation, high degree block, or  pauses noted. Isolated atrial and ventricular ectopy was rare (<1%). 524 Supraventricular Tachycardia runs occurred, the run with the fastest interval lasting 6 beats with a max rate of 158 bpm, the longest lasting 19 beats with an avg rate of 104 bpm.   ETT 02/26/21 Blood pressure demonstrated a normal response to exercise. Upsloping ST segment depression ST segment depression of 1 mm was noted during stress in the V5, V6, III and aVF leads.   ETT with fair exercise tolerance (6:19); no chest pain; normal blood pressure response; study terminated secondary to dyspnea; 1 mm  of upsloping to horizontal ST depression in the inferior lateral leads of borderline significance; patient achieved 74% (inadequate) predicted maximum heart rate; suggest alternative study if clinical suspicion of ischemia high (nuclear study or cardiac CTA).   Coronary CT/Calcium Score 12/03/2021: Coronary Arteries:  Normal coronary origin.  Right dominance.   RCA is a large dominant artery that gives rise to PDA and PLA. There is noncalcified plaque in the proximal RCA causing 50-69% stenosis. Calcified plaque in the proximal RCA causes 0-24% stenosis   Left main is a large artery that gives rise to LAD and LCX arteries.   LAD is a large vessel. There is calcified plaque in the proximal LAD causing 0-24% stenosis   LCX is a non-dominant artery that gives rise to one large OM1 branch. There is calcified plaque in the proximal LCX causing 0-24% stenosis   Other findings:   Left Ventricle: Normal size   Left Atrium: Mild enlargement. PFO   Pulmonary Veins: Normal configuration   Right Ventricle: Normal size   Right Atrium: Normal size   Cardiac valves: No calcifications   Thoracic aorta: Normal size   Pulmonary Arteries: Normal size   Systemic Veins: Normal drainage   Pericardium: Normal thickness   IMPRESSION: 1. Coronary calcium score of 150. This was 67th percentile for age and sex matched  control.   2.  Normal coronary origin with right dominance.   3. Noncalcified plaque in the proximal RCA causes moderate (50-69%) stenosis   4. Calcified plaque in the proximal RCA, proximal LAD, and proximal LCX causes minimal (0-24%) stenosis   5.  PFO   6.  Will send for CT FFR   CAD-RADS 3. Moderate stenosis. Consider symptom-guided anti-ischemic pharmacotherapy as well as risk factor modification per guideline directed care. Additional analysis with CT FFR will be submitted.    EKG:  EKG is ordered today.  The ekg ordered today demonstrates normal sinus and 76 bpm with no acute ST/T wave changes.  Recent Labs: 10/28/2021: BUN 16; Creatinine, Ser 0.62; Hemoglobin 14.3; Magnesium 2.4; Platelets 176; Potassium 4.6; Sodium 138; TSH 0.773  Recent Lipid Panel    Component Value Date/Time   CHOL 199 12/09/2018 0352   TRIG 110 12/09/2018 0352   HDL 45 12/09/2018 0352   CHOLHDL 4.4 12/09/2018 0352   VLDL 22 12/09/2018 0352   LDLCALC 132 (H) 12/09/2018 0352   Home Medications   Current Meds  Medication Sig   acetaminophen (TYLENOL) 325 MG tablet Take 325 mg by mouth every 6 (six) hours as needed for headache.    aspirin EC 81 MG EC tablet Take 1 tablet (81 mg total) by mouth daily.   atorvastatin (LIPITOR) 40 MG tablet Take 1 tablet (40 mg total) by mouth daily at 6 PM.   Calcium Carb-Cholecalciferol (CALCIUM 600 + D PO) Take 1,500 mg by mouth See admin instructions. Take 1 tablet after breakfast, 1 tablet after lunch, and 0.5 tablet after dinner   Carboxymethylcellulose Sodium (ARTIFICIAL TEARS OP) Place 1 drop into both eyes 3 (three) times daily as needed (dry eyes).    Coenzyme Q10 50 MG CAPS Take 50 mg by mouth daily.    levothyroxine (SYNTHROID) 75 MCG tablet Take 75 mcg by mouth every Tuesday, Thursday, Saturday, and Sunday.    levothyroxine (SYNTHROID, LEVOTHROID) 50 MCG tablet Take 50 mcg by mouth every Monday, Wednesday, and Friday.    loratadine (CLARITIN) 10 MG  tablet Take 10 mg by mouth daily.   metoprolol succinate (TOPROL-XL) 25  MG 24 hr tablet Take 0.5 tablets (12.5 mg total) by mouth daily.   Multiple Vitamin (MULTIVITAMIN) capsule Take 1 capsule by mouth daily. Senior   Polyvinyl Alcohol-Povidone PF (REFRESH) 1.4-0.6 % SOLN Apply 1 drop to eye as needed (dry eye). Both eyes   Propylene Glycol (SYSTANE BALANCE OP) Apply 1 drop to eye as needed (dry eye). Both eyes.   ticagrelor (BRILINTA) 60 MG TABS tablet Take 0.5 tablets (30 mg total) by mouth 2 (two) times daily.   vitamin E 400 UNIT capsule Take 400 Units by mouth every other day.      Review of Systems      All other systems reviewed and are otherwise negative except as noted above.  Physical Exam    VS:  BP 132/82   Pulse 76   Ht '5\' 5"'$  (1.651 m)   Wt 117 lb (53.1 kg)   BMI 19.47 kg/m  , BMI Body mass index is 19.47 kg/m.  Wt Readings from Last 3 Encounters:  05/20/22 117 lb (53.1 kg)  01/11/22 118 lb 4.8 oz (53.7 kg)  12/21/21 117 lb 14.4 oz (53.5 kg)   GEN: Well nourished, well developed, in no acute distress. HEENT: normal. Neck: Supple, no JVD, carotid bruits, or masses. Cardiac: RRR, no murmurs, rubs, or gallops. No clubbing, cyanosis, edema.  Radials/PT 2+ and equal bilaterally.  Respiratory:  Respirations regular and unlabored, clear to auscultation bilaterally. GI: Soft, nontender, nondistended. MS: No deformity or atrophy. Skin: Warm and dry, no rash. Neuro:  Strength and sensation are intact. Anxious.  Psych: Normal affect.  Assessment & Plan    Coronary artery disease - Reports exertional dyspnea as well as chest pain with onset 05/03/22 with activity such as walking outside her camper that used to not cause any symptoms. EKG today no acute St/T wave changes. Previous cath 2013 with 30% RCA stenosis. Cardiac CT 11/2021 with pRCA with moderate stenosis and FFR 0.8. Continue aspirin, brilinta, atorvastatin. Plan for cardiac catheterization.   Shared Decision  Making/Informed Consent The risks [stroke (1 in 1000), death (1 in 1000), kidney failure [usually temporary] (1 in 500), bleeding (1 in 200), allergic reaction [possibly serious] (1 in 200)], benefits (diagnostic support and management of coronary artery disease) and alternatives of a cardiac catheterization were discussed in detail with Shannon [name] and she is willing to proceed.   Palpitations / Tachycardia / Bradycardia / Sinus pause 1.68 sec - Previous monitor with paroxymal SVT. Continue Toprol 12.'5mg'$  QD. Did not tolerate increased doses nor Diltiazem. Continue to stay well hydrated, avoid caffeine, manage stress well.  PFO -incidental finding on CT.  Negative bubble study on prior echo.  History of stroke but was due to ischemia/stenosis. No indication for closure.   HTN - Labile blood pressure at home. Per Dr. Harrell Gave base antihypertensive regimen on home readings as element of Shannon [name] hypertension. BP reasonably well controlled in clinic - continue current regimen.   Hypothyroidism - Managed by primary care.   History of CVA - On Aspirin, Brilinta per interventional neurology. Follows with neurology.   Disposition: Follow up 2 weeks after cardiac cath with Buford Dresser, MD or APP.  Signed, Loel Dubonnet, NP 05/20/2022, 10:58 AM [place]

## 2022-05-24 LAB — BASIC METABOLIC PANEL
BUN/Creatinine Ratio: 21 (ref 12–28)
BUN: 14 mg/dL (ref 8–27)
CO2: 23 mmol/L (ref 20–29)
Calcium: 9.8 mg/dL (ref 8.7–10.3)
Chloride: 103 mmol/L (ref 96–106)
Creatinine, Ser: 0.67 mg/dL (ref 0.57–1.00)
Glucose: 111 mg/dL — ABNORMAL HIGH (ref 70–99)
Potassium: 4.5 mmol/L (ref 3.5–5.2)
Sodium: 141 mmol/L (ref 134–144)
eGFR: 91 mL/min/{1.73_m2} (ref >59)

## 2022-05-24 LAB — CBC
Hematocrit: 41.6 % (ref 34.0–46.6)
Hemoglobin: 14 g/dL (ref 11.1–15.9)
MCH: 31.5 pg (ref 26.6–33.0)
MCHC: 33.7 g/dL (ref 31.5–35.7)
MCV: 94 fL (ref 79–97)
Platelets: 206 10*3/uL (ref 150–450)
RBC: 4.45 x10E6/uL (ref 3.77–5.28)
RDW: 11.8 % (ref 11.7–15.4)
WBC: 3.6 10*3/uL (ref 3.4–10.8)

## 2022-05-27 ENCOUNTER — Telehealth: Payer: Self-pay | Admitting: *Deleted

## 2022-05-27 NOTE — Telephone Encounter (Signed)
Cardiac Catheterization scheduled at [facility] for: Monday May 31, 2022 7:30 AM Arrival time and place: [place] Entrance A at: 5:30 AM   Nothing to eat after midnight prior to procedure, clear liquids until 5 AM day of procedure.  Medication instructions: -Usual morning medications can be taken with sips of water including aspirin 81 mg and Brilinta 90 mg.  Confirmed patient has responsible adult to drive home post procedure and be with patient first 24 hours after arriving home.  Patient reports no new symptoms concerning for COVID-19.  Reviewed procedure instructions with patient.

## 2022-05-31 ENCOUNTER — Encounter (HOSPITAL_COMMUNITY)
Admission: RE | Disposition: A | Discharge status: Home / Self Care | Payer: Self-pay | Source: Home / Self Care | Attending: Interventional Cardiology

## 2022-05-31 ENCOUNTER — Ambulatory Visit (HOSPITAL_COMMUNITY)
Admission: RE | Admit: 2022-05-31 | Discharge: 2022-05-31 | Disposition: A | Discharge status: Home / Self Care | Payer: Medicare Other | Attending: Interventional Cardiology | Admitting: Interventional Cardiology

## 2022-05-31 ENCOUNTER — Other Ambulatory Visit (HOSPITAL_COMMUNITY): Payer: Self-pay

## 2022-05-31 ENCOUNTER — Other Ambulatory Visit: Payer: Self-pay

## 2022-05-31 DIAGNOSIS — E785 Hyperlipidemia, unspecified: Secondary | ICD-10-CM | POA: Insufficient documentation

## 2022-05-31 DIAGNOSIS — E039 Hypothyroidism, unspecified: Secondary | ICD-10-CM | POA: Insufficient documentation

## 2022-05-31 DIAGNOSIS — Z7982 Long term (current) use of aspirin: Secondary | ICD-10-CM | POA: Insufficient documentation

## 2022-05-31 DIAGNOSIS — I10 Essential (primary) hypertension: Secondary | ICD-10-CM | POA: Diagnosis not present

## 2022-05-31 DIAGNOSIS — Q2112 Patent foramen ovale: Secondary | ICD-10-CM | POA: Insufficient documentation

## 2022-05-31 DIAGNOSIS — I491 Atrial premature depolarization: Secondary | ICD-10-CM | POA: Diagnosis not present

## 2022-05-31 DIAGNOSIS — Z955 Presence of coronary angioplasty implant and graft: Secondary | ICD-10-CM | POA: Insufficient documentation

## 2022-05-31 DIAGNOSIS — Z7902 Long term (current) use of antithrombotics/antiplatelets: Secondary | ICD-10-CM | POA: Insufficient documentation

## 2022-05-31 DIAGNOSIS — Z79899 Other long term (current) drug therapy: Secondary | ICD-10-CM | POA: Diagnosis not present

## 2022-05-31 DIAGNOSIS — Z8673 Personal history of transient ischemic attack (TIA), and cerebral infarction without residual deficits: Secondary | ICD-10-CM | POA: Diagnosis not present

## 2022-05-31 DIAGNOSIS — I25118 Atherosclerotic heart disease of native coronary artery with other forms of angina pectoris: Secondary | ICD-10-CM | POA: Diagnosis not present

## 2022-05-31 DIAGNOSIS — R0609 Other forms of dyspnea: Secondary | ICD-10-CM | POA: Insufficient documentation

## 2022-05-31 DIAGNOSIS — I2510 Atherosclerotic heart disease of native coronary artery without angina pectoris: Secondary | ICD-10-CM

## 2022-05-31 DIAGNOSIS — R002 Palpitations: Secondary | ICD-10-CM | POA: Diagnosis not present

## 2022-05-31 HISTORY — PX: INTRAVASCULAR ULTRASOUND/IVUS: CATH118244

## 2022-05-31 HISTORY — PX: CORONARY STENT INTERVENTION: CATH118234

## 2022-05-31 HISTORY — PX: LEFT HEART CATH AND CORONARY ANGIOGRAPHY: CATH118249

## 2022-05-31 LAB — POCT ACTIVATED CLOTTING TIME: Activated Clotting Time: 407 seconds

## 2022-05-31 SURGERY — LEFT HEART CATH AND CORONARY ANGIOGRAPHY
Anesthesia: LOCAL

## 2022-05-31 MED ORDER — LIDOCAINE HCL (PF) 1 % IJ SOLN
INTRAMUSCULAR | Status: DC | PRN
  Administered 2022-05-31: 2 mL

## 2022-05-31 MED ORDER — HYDRALAZINE HCL 20 MG/ML IJ SOLN: 10.0000 mg | INTRAMUSCULAR | Status: AC | PRN

## 2022-05-31 MED ORDER — TICAGRELOR 90 MG PO TABS
ORAL_TABLET | ORAL | Status: AC
  Filled 2022-05-31: qty 1

## 2022-05-31 MED ORDER — ACETAMINOPHEN 325 MG PO TABS
650.0000 mg | ORAL_TABLET | ORAL | Status: DC | PRN
  Administered 2022-05-31: 650 mg via ORAL
  Filled 2022-05-31: qty 2

## 2022-05-31 MED ORDER — NITROGLYCERIN 1 MG/10 ML FOR IR/CATH LAB
INTRA_ARTERIAL | Status: DC | PRN
  Administered 2022-05-31 (×2): 200 ug via INTRACORONARY

## 2022-05-31 MED ORDER — HEPARIN SODIUM (PORCINE) 1000 UNIT/ML IJ SOLN
INTRAMUSCULAR | Status: AC
  Filled 2022-05-31: qty 10

## 2022-05-31 MED ORDER — HEPARIN (PORCINE) IN NACL 1000-0.9 UT/500ML-% IV SOLN
INTRAVENOUS | Status: AC
  Filled 2022-05-31: qty 500

## 2022-05-31 MED ORDER — TICAGRELOR 90 MG PO TABS
90.0000 mg | ORAL_TABLET | Freq: Two times a day (BID) | ORAL | 3 refills | Status: DC
  Filled 2022-05-31: qty 60, 30d supply, fill #0

## 2022-05-31 MED ORDER — SODIUM CHLORIDE 0.9 % IV SOLN: INTRAVENOUS | Status: AC

## 2022-05-31 MED ORDER — LEVOTHYROXINE SODIUM 50 MCG PO TABS
50.0000 ug | ORAL_TABLET | ORAL | Status: DC
Start: 2022-05-31 — End: 2022-05-31

## 2022-05-31 MED ORDER — NITROGLYCERIN 0.4 MG SL SUBL: 0.4000 mg | SUBLINGUAL_TABLET | SUBLINGUAL | Status: DC | PRN

## 2022-05-31 MED ORDER — SODIUM CHLORIDE 0.9% FLUSH: 3.0000 mL | INTRAVENOUS | Status: DC | PRN

## 2022-05-31 MED ORDER — NITROGLYCERIN 1 MG/10 ML FOR IR/CATH LAB
INTRA_ARTERIAL | Status: DC | PRN
  Administered 2022-05-31: 300 ug via INTRA_ARTERIAL

## 2022-05-31 MED ORDER — NITROGLYCERIN 1 MG/10 ML FOR IR/CATH LAB
INTRA_ARTERIAL | Status: AC
  Filled 2022-05-31: qty 10

## 2022-05-31 MED ORDER — LIDOCAINE HCL (PF) 1 % IJ SOLN
INTRAMUSCULAR | Status: AC
  Filled 2022-05-31: qty 30

## 2022-05-31 MED ORDER — MULTIVITAMINS PO CAPS
1.0000 | ORAL_CAPSULE | Freq: Every day | ORAL | Status: DC
Start: 2022-05-31 — End: 2022-05-31

## 2022-05-31 MED ORDER — VERAPAMIL HCL 2.5 MG/ML IV SOLN
INTRAVENOUS | Status: DC | PRN
  Administered 2022-05-31 (×2): 10 mL via INTRA_ARTERIAL

## 2022-05-31 MED ORDER — HEPARIN (PORCINE) IN NACL 1000-0.9 UT/500ML-% IV SOLN
INTRAVENOUS | Status: DC | PRN
  Administered 2022-05-31 (×2): 500 mL

## 2022-05-31 MED ORDER — SODIUM CHLORIDE 0.9 % IV SOLN: 250.0000 mL | INTRAVENOUS | Status: DC | PRN

## 2022-05-31 MED ORDER — SODIUM CHLORIDE 0.9 % WEIGHT BASED INFUSION
3.0000 mL/kg/h | INTRAVENOUS | Status: AC
  Administered 2022-05-31: 3 mL/kg/h via INTRAVENOUS

## 2022-05-31 MED ORDER — HEPARIN SODIUM (PORCINE) 1000 UNIT/ML IJ SOLN
INTRAMUSCULAR | Status: DC | PRN
  Administered 2022-05-31: 4000 [IU] via INTRAVENOUS
  Administered 2022-05-31: 3000 [IU] via INTRAVENOUS

## 2022-05-31 MED ORDER — VERAPAMIL HCL 2.5 MG/ML IV SOLN
INTRAVENOUS | Status: AC
  Filled 2022-05-31: qty 2

## 2022-05-31 MED ORDER — MIDAZOLAM HCL 2 MG/2ML IJ SOLN
INTRAMUSCULAR | Status: DC | PRN
  Administered 2022-05-31: 2 mg via INTRAVENOUS

## 2022-05-31 MED ORDER — LABETALOL HCL 5 MG/ML IV SOLN: 10.0000 mg | INTRAVENOUS | Status: AC | PRN

## 2022-05-31 MED ORDER — SODIUM CHLORIDE 0.9% FLUSH
3.0000 mL | Freq: Two times a day (BID) | INTRAVENOUS | Status: DC
Start: 2022-05-31 — End: 2022-05-31

## 2022-05-31 MED ORDER — SODIUM CHLORIDE 0.9% FLUSH: 3.0000 mL | Freq: Two times a day (BID) | INTRAVENOUS | Status: DC

## 2022-05-31 MED ORDER — TICAGRELOR 90 MG PO TABS: 90.0000 mg | ORAL_TABLET | Freq: Two times a day (BID) | ORAL | Status: DC

## 2022-05-31 MED ORDER — ASPIRIN 81 MG PO CHEW: 81.0000 mg | CHEWABLE_TABLET | Freq: Every day | ORAL | Status: DC

## 2022-05-31 MED ORDER — SODIUM CHLORIDE 0.9 % WEIGHT BASED INFUSION: 1.0000 mL/kg/h | INTRAVENOUS | Status: DC

## 2022-05-31 MED ORDER — FENTANYL CITRATE (PF) 100 MCG/2ML IJ SOLN
INTRAMUSCULAR | Status: AC
  Filled 2022-05-31: qty 2

## 2022-05-31 MED ORDER — COENZYME Q10 50 MG PO CAPS
50.0000 mg | ORAL_CAPSULE | Freq: Every day | ORAL | Status: DC
Start: 2022-05-31 — End: 2022-05-31

## 2022-05-31 MED ORDER — TICAGRELOR 90 MG PO TABS
ORAL_TABLET | ORAL | Status: DC | PRN
  Administered 2022-05-31: 180 mg via ORAL

## 2022-05-31 MED ORDER — VITAMIN E 45 MG (100 UNIT) PO CAPS
400.0000 [IU] | ORAL_CAPSULE | ORAL | Status: DC
Start: 2022-05-31 — End: 2022-05-31

## 2022-05-31 MED ORDER — ACETAMINOPHEN 325 MG PO TABS: 325.0000 mg | ORAL_TABLET | Freq: Four times a day (QID) | ORAL | Status: DC | PRN

## 2022-05-31 MED ORDER — ASPIRIN 81 MG PO TBEC
81.0000 mg | DELAYED_RELEASE_TABLET | Freq: Every day | ORAL | Status: DC
Start: 2022-05-31 — End: 2022-05-31

## 2022-05-31 MED ORDER — METOPROLOL SUCCINATE 12.5 MG HALF TABLET
12.5000 mg | ORAL_TABLET | Freq: Every day | ORAL | Status: DC
Start: 2022-05-31 — End: 2022-05-31

## 2022-05-31 MED ORDER — ONDANSETRON HCL 4 MG/2ML IJ SOLN: 4.0000 mg | Freq: Four times a day (QID) | INTRAMUSCULAR | Status: DC | PRN

## 2022-05-31 MED ORDER — LORATADINE 10 MG PO TABS
10.0000 mg | ORAL_TABLET | Freq: Every day | ORAL | Status: DC
Start: 2022-05-31 — End: 2022-05-31

## 2022-05-31 MED ORDER — MIDAZOLAM HCL 2 MG/2ML IJ SOLN
INTRAMUSCULAR | Status: AC
  Filled 2022-05-31: qty 2

## 2022-05-31 MED ORDER — FENTANYL CITRATE (PF) 100 MCG/2ML IJ SOLN
INTRAMUSCULAR | Status: DC | PRN
  Administered 2022-05-31: 25 ug via INTRAVENOUS

## 2022-05-31 MED ORDER — ASPIRIN 81 MG PO CHEW: 81.0000 mg | CHEWABLE_TABLET | ORAL | Status: AC

## 2022-05-31 MED ORDER — ATORVASTATIN CALCIUM 40 MG PO TABS: 40.0000 mg | ORAL_TABLET | Freq: Every day | ORAL | Status: DC

## 2022-05-31 MED ORDER — POLYVINYL ALCOHOL-POVIDONE PF 1.4-0.6 % OP SOLN: 1.0000 [drp] | OPHTHALMIC | Status: DC | PRN

## 2022-05-31 MED ORDER — LEVOTHYROXINE SODIUM 75 MCG PO TABS: 75.0000 ug | ORAL_TABLET | ORAL | Status: DC

## 2022-05-31 MED ORDER — IOHEXOL 350 MG/ML SOLN
INTRAVENOUS | Status: DC | PRN
  Administered 2022-05-31: 78 mL

## 2022-05-31 SURGICAL SUPPLY — 21 items
BALL SAPPHIRE NC24 3.25X12 (BALLOONS) ×2
BALLOON SAPPHIRE NC24 3.25X12 (BALLOONS) IMPLANT
BAND CMPR LRG ZPHR (HEMOSTASIS) ×1
BAND ZEPHYR COMPRESS 30 LONG (HEMOSTASIS) ×1 IMPLANT
CATH 5FR JL3.5 JR4 ANG PIG MP (CATHETERS) ×1 IMPLANT
CATH LAUNCHER 6FR JR4 (CATHETERS) ×1 IMPLANT
CATH OPTICROSS HD (CATHETERS) ×1 IMPLANT
GLIDESHEATH SLEND SS 6F .021 (SHEATH) ×1 IMPLANT
GUIDEWIRE INQWIRE 1.5J.035X260 (WIRE) IMPLANT
INQWIRE 1.5J .035X260CM (WIRE) ×2
KIT ENCORE 26 ADVANTAGE (KITS) ×1 IMPLANT
KIT HEART LEFT (KITS) ×2 IMPLANT
KIT HEMO VALVE WATCHDOG (MISCELLANEOUS) ×1 IMPLANT
PACK CARDIAC CATHETERIZATION (CUSTOM PROCEDURE TRAY) ×2 IMPLANT
SHEATH PROBE COVER 6X72 (BAG) ×1 IMPLANT
SLED PULL BACK IVUS (MISCELLANEOUS) ×1 IMPLANT
STENT SYNERGY XD 3.0X16 (Permanent Stent) IMPLANT
SYNERGY XD 3.0X16 (Permanent Stent) ×2 IMPLANT
TRANSDUCER W/STOPCOCK (MISCELLANEOUS) ×2 IMPLANT
TUBING CIL FLEX 10 FLL-RA (TUBING) ×2 IMPLANT
WIRE ASAHI PROWATER 180CM (WIRE) ×1 IMPLANT

## 2022-05-31 NOTE — Progress Notes (Signed)
Right radial site with dampened radial pleth when ulnar site compressed

## 2022-05-31 NOTE — Discharge Summary (Cosign Needed)
Discharge Summary for Same Day PCI   Patient ID: Shannon [name] MRN: [id number]; DOB: July 24, 1946  Admit date: 05/31/2022 Discharge date: 05/31/2022  Primary Care Provider: Donalynn Furlong, MD  Primary Cardiologist: Buford Dresser, MD  Primary Electrophysiologist:  None   Discharge Diagnoses    Active Problems:   Coronary artery disease of native artery of native heart with stable angina pectoris ([place])  HTN  HLD  Diagnostic Studies/Procedures    Cardiac Catheterization 05/31/2022: CORONARY STENT INTERVENTION  Intravascular Ultrasound/IVUS  LEFT HEART CATH AND CORONARY ANGIOGRAPHY   Conclusion      Prox RCA lesion is 75% stenosed.  A drug-eluting stent was successfully placed using a SYNERGY XD 3.0X16, postdilated to 3.25 mm and optimized with IVUS.   Post intervention, there is a 0% residual stenosis.   Dist LAD lesion is 10% stenosed.   Mid Cx to Dist Cx lesion is 10% stenosed.   The left ventricular systolic function is normal.   LV end diastolic pressure is normal.   The left ventricular ejection fraction is 55-65% by visual estimate.   There is no aortic valve stenosis.   Successful PCI of the proximal RCA.  Patient was on a very low-dose of Brilinta in the past.  Apparently, she was a Plavix nonresponder.  She will need to go back to Brilinta 90 mg twice a day for 6 months, ideally.  Could consider 3 months if there were bleeding issues.  After that time, her Brilinta could be reduced.  Will CC Dr. Estanislado Pandy.   Continue aggressive secondary prevention. Diagnostic Dominance: Right  Intervention     History of Present Illness     Shannon [name] is a 76 y.o. female with a hx of hypertension, CVA, CAD presents for outpatient cath.   Prior cardiac cath (date unknown) with reported 30% RCA lesion. Prior echo 2019 with normal LVEF, negative bubble study. Previous ETT 02/26/21 with fair exercise tolerance, 53m up-sloping to horizontal ST depressions in inferior lateral  leads of borderline significance.  Due to chest pain cardiac CTA performed 12/03/2021 with coronary calcium score 150 placing her in the 67th percentile for age and sex matched control.  She had noncalcified plaques in the proximal RCA with moderate 50 to 69% stenosis, calcified plaque in the proximal RCA, LAD, proximal LCx with 0 to 24% stenosis.  Noted PFO.  FFR on RCA of 0.8 suggesting borderline likelihood of hemodynamic significance.  She was started on Imdur but did not tolerate.  Previously did not tolerate increased doses of metoprolol or diltiazem.    Reported exertional chest pressure concerning for angina. Cardiac catheterization was arranged for further evaluation.  Hospital Course     The patient underwent cardiac cath as noted above with 70% pRCA stenosis s/p DES PCI. She was on a very low-dose of Brilinta in the past.  Apparently, she was a Plavix nonresponder.   Plan for DAPT with ASA/Brilinta '90mg'$  for at least 6 months. Could consider 3 months if there were bleeding issues.  After that time, her Brilinta could be reduced.   The patient was seen by cardiac rehab while in short stay. There were no observed complications post cath. Radial cath site was re-evaluated prior to discharge and found to be stable without any complications. Instructions/precautions regarding cath site care were given prior to discharge.  Shannon [name] seen by Dr. VIrish Lackand determined stable for discharge home. Follow up with our office has been arranged. Medications are listed below. Pertinent changes include Brilinta '90mg'$   BID.  Per pharmacy Brilinta cost $203.89/month.   Cath/PCI Registry Performance & Quality Measures: Aspirin prescribed? - Yes ADP Receptor Inhibitor (Plavix/Clopidogrel, Brilinta/Ticagrelor or Effient/Prasugrel) prescribed (includes medically managed patients)? - Yes High Intensity Statin (Lipitor 40-'80mg'$  or Crestor 20-'40mg'$ ) prescribed? - Yes For EF <40%, was ACEI/ARB prescribed? - Not  Applicable (EF >/= 60%) For EF <40%, Aldosterone Antagonist (Spironolactone or Eplerenone) prescribed? - Not Applicable (EF >/= 73%) Cardiac Rehab Phase II ordered (Included Medically managed Patients)? - Yes   Discharge Vitals Blood pressure (!) 144/65, pulse (!) 58, temperature 97.8 F (36.6 C), temperature source Temporal, resp. rate 15, height '5\' 5"'$  (1.651 m), weight 52.2 kg, SpO2 99 %.  Filed Weights   05/31/22 0542  Weight: 52.2 kg    Last Labs & Radiologic Studies   _____________  CARDIAC CATHETERIZATION  Result Date: 05/31/2022   Prox RCA lesion is 75% stenosed.  A drug-eluting stent was successfully placed using a SYNERGY XD 3.0X16, postdilated to 3.25 mm and optimized with IVUS.   Post intervention, there is a 0% residual stenosis.   Dist LAD lesion is 10% stenosed.   Mid Cx to Dist Cx lesion is 10% stenosed.   The left ventricular systolic function is normal.   LV end diastolic pressure is normal.   The left ventricular ejection fraction is 55-65% by visual estimate.   There is no aortic valve stenosis. Successful PCI of the proximal RCA.  Patient was on a very low-dose of Brilinta in the past.  Apparently, she was a Plavix nonresponder.  She will need to go back to Brilinta 90 mg twice a day for 6 months, ideally.  Could consider 3 months if there were bleeding issues.  After that time, her Brilinta could be reduced.  Will CC Dr. Estanislado Pandy. Continue aggressive secondary prevention.    Disposition   Pt is being discharged home today in good condition.  Follow-up Plans & Appointments     Follow-up Information     Loel Dubonnet, NP Follow up on 06/14/2022.   Specialty: Cardiology Why: '@10'$ :30am for cath followu up Contact information: [place] [address] [phone]                Discharge Instructions     Amb Referral to Cardiac Rehabilitation   Complete by: As directed    Diagnosis: Coronary Stents   After initial evaluation and  assessments completed: Virtual Based Care may be provided alone or in conjunction with Phase 2 Cardiac Rehab based on patient barriers.: Yes        Discharge Medications   Allergies as of 05/31/2022       Reactions   Elastic Bandages & [zinc] Itching   Sulfa Antibiotics Other (See Comments)   Tongue turned black   Tape    Pulled blood through the skin- happened during a procedure in Oct 2021    Mandol [cefamandole] Rash   Sinutab Non-drowsy Max [pseudoephedrine-acetaminophen] Palpitations        Medication List     TAKE these medications    acetaminophen 325 MG tablet Commonly known as: TYLENOL Take 325 mg by mouth every 6 (six) hours as needed for headache.   aspirin EC 81 MG tablet Take 1 tablet (81 mg total) by mouth daily.   atorvastatin 40 MG tablet Commonly known as: LIPITOR Take 1 tablet (40 mg total) by mouth daily at 6 PM.   Brilinta 90 MG Tabs tablet Generic drug: ticagrelor Take 1 tablet (90 mg total)  by mouth 2 (two) times daily. What changed:  medication strength how much to take   CALCIUM 600 + D PO Take 300-600 mg by mouth See admin instructions. Take 600 mg after breakfast, 600 mg after lunch, and 600 mg after dinner   Coenzyme Q10 50 MG Caps Take 50 mg by mouth daily.   levothyroxine 75 MCG tablet Commonly known as: SYNTHROID Take 75 mcg by mouth every Tuesday, Thursday, Saturday, and Sunday.   levothyroxine 50 MCG tablet Commonly known as: SYNTHROID Take 50 mcg by mouth every Monday, Wednesday, and Friday.   loratadine 10 MG tablet Commonly known as: CLARITIN Take 10 mg by mouth daily.   metoprolol succinate 25 MG 24 hr tablet Commonly known as: TOPROL-XL Take 0.5 tablets (12.5 mg total) by mouth daily.   multivitamin capsule Take 1 capsule by mouth daily. Senior   nitroGLYCERIN 0.4 MG SL tablet Commonly known as: NITROSTAT Place 1 tablet (0.4 mg total) under the tongue every 5 (five) minutes as needed for chest pain.    Refresh 1.4-0.6 % Soln Generic drug: Polyvinyl Alcohol-Povidone PF Apply 1 drop to eye as needed (dry eye). Both eyes   SYSTANE BALANCE OP Apply 1 drop to eye as needed (dry eye). Both eyes.   vitamin E 180 MG (400 UNITS) capsule Take 400 Units by mouth every other day.           Allergies Allergies  Allergen Reactions   Elastic Bandages & [Zinc] Itching   Sulfa Antibiotics Other (See Comments)    Tongue turned black   Tape     Pulled blood through the skin- happened during a procedure in Oct 2021    Mandol [Cefamandole] Rash   Sinutab Non-Drowsy Max [Pseudoephedrine-Acetaminophen] Palpitations    Outstanding Labs/Studies   N/A  Duration of Discharge Encounter   Greater than 30 minutes including physician time.  SignedCrista Luria Howard, PA 05/31/2022, 2:49 PM   I have examined the patient and reviewed assessment and plan and discussed with patient.  Agree with above as stated.  D/w Dr. Estanislado Pandy.  Successful IVUS guided PCI of the RCA.  Will increase Brilinta dose while she is in for 6 months post cardiac stent.  After that, could decrease Brilinta back to her usual dose if needed.  Continue aggressive secondary prevention.  Radial precautions given.  Plan for same-day discharge post PCI.  Shannon [name]

## 2022-05-31 NOTE — Progress Notes (Addendum)
Seen pt from [phone] pt was educated on stent procedure, restrictions, Aspirin & Birlinta med use, ex guidelines, NTG use, heart healthy diet, and CRPII. Pt is being referred to [facility] in [facility].    Christen Bame 05/31/2022 10:46 AM

## 2022-05-31 NOTE — Interval H&P Note (Signed)
Cath Lab Visit (complete for each Cath Lab visit)  Clinical Evaluation Leading to the Procedure:   ACS: No.  Non-ACS:    Anginal Classification: CCS III  Anti-ischemic medical therapy: Minimal Therapy (1 class of medications)  Non-Invasive Test Results: High-risk stress test findings: cardiac mortality >3%/year  Prior CABG: No previous CABG   Abnormal CT FFR of RCA   History and Physical Interval Note:  05/31/2022 7:39 AM  Shannon [name]  has presented today for surgery, with the diagnosis of angina.  The various methods of treatment have been discussed with the patient and family. After consideration of risks, benefits and other options for treatment, the patient has consented to  Procedure(s): LEFT HEART CATH AND CORONARY ANGIOGRAPHY (N/A) as a surgical intervention.  The patient's history has been reviewed, patient examined, no change in status, stable for surgery.  I have reviewed the patient's chart and labs.  Questions were answered to the patient's satisfaction.     Larae Grooms

## 2022-06-01 ENCOUNTER — Telehealth: Payer: Self-pay | Admitting: Cardiology

## 2022-06-01 ENCOUNTER — Encounter (HOSPITAL_COMMUNITY): Payer: Self-pay | Admitting: Interventional Cardiology

## 2022-06-01 NOTE — Telephone Encounter (Signed)
Okay to remove bandage.  Recommend elevation. If she rests her elbow on arm of chair and puts a pillow (or rolled up towel) under wrist it will help with the swelling. May also use warm compress 20 minutes on and 20 minutes off. Ensure no lifting more than 10 pounds for 2 weeks.  Should gradually improve over the next few days. She may have a small hematoma which is common and could cause some bruising.   I recommend she keep an eye on it. If notes worsening swelling or increase in size of knot could schedule her for nurse visit Friday at 1:15PM if needed.   Loel Dubonnet, NP

## 2022-06-01 NOTE — Telephone Encounter (Signed)
Spoke with patient regarding cath site Per patient cath site had knot to the right below the thumb yesterday and nurse told her swelling would go down Knot size of a quarter Continues to have swelling with bruising but does not appear to be red or warm/hot to touch  She has not removed bandages  Stated fingers look swollen Is having thumb pain but no site pain  Has not tried cold compresses but has kept elevated When she received follow up call this am from the hospital was told to contact cardiology office this afternoon is swelling continued   Will forward to Overton Mam NP for review

## 2022-06-01 NOTE — Telephone Encounter (Signed)
Patient states she had a procedure and her arm is still swollen. She states she is supposed to take the bandages off at 3 pm today in order to shower. She would like to know if she should put a cold or warm compress on it. She states she has been keeping it elevated.

## 2022-06-01 NOTE — Telephone Encounter (Signed)
Advised patient, verbalized understanding  

## 2022-06-02 ENCOUNTER — Emergency Department (HOSPITAL_COMMUNITY): Payer: Medicare Other

## 2022-06-02 ENCOUNTER — Emergency Department (HOSPITAL_COMMUNITY)
Admission: EM | Admit: 2022-06-02 | Discharge: 2022-06-02 | Disposition: A | Discharge status: Home / Self Care | Payer: Medicare Other | Attending: Emergency Medicine | Admitting: Emergency Medicine

## 2022-06-02 ENCOUNTER — Other Ambulatory Visit: Payer: Self-pay

## 2022-06-02 DIAGNOSIS — Z7982 Long term (current) use of aspirin: Secondary | ICD-10-CM | POA: Insufficient documentation

## 2022-06-02 DIAGNOSIS — I10 Essential (primary) hypertension: Secondary | ICD-10-CM

## 2022-06-02 DIAGNOSIS — R5383 Other fatigue: Secondary | ICD-10-CM | POA: Diagnosis not present

## 2022-06-02 DIAGNOSIS — R0789 Other chest pain: Secondary | ICD-10-CM | POA: Diagnosis not present

## 2022-06-02 DIAGNOSIS — R519 Headache, unspecified: Secondary | ICD-10-CM | POA: Insufficient documentation

## 2022-06-02 DIAGNOSIS — R42 Dizziness and giddiness: Secondary | ICD-10-CM | POA: Insufficient documentation

## 2022-06-02 DIAGNOSIS — R072 Precordial pain: Secondary | ICD-10-CM

## 2022-06-02 LAB — CBC WITH DIFFERENTIAL/PLATELET
Abs Immature Granulocytes: 0.01 10*3/uL (ref 0.00–0.07)
Basophils Absolute: 0 10*3/uL (ref 0.0–0.1)
Basophils Relative: 1 %
Eosinophils Absolute: 0.1 10*3/uL (ref 0.0–0.5)
Eosinophils Relative: 1 %
HCT: 40.7 % (ref 36.0–46.0)
Hemoglobin: 14.4 g/dL (ref 12.0–15.0)
Immature Granulocytes: 0 %
Lymphocytes Relative: 28 %
Lymphs Abs: 1 10*3/uL (ref 0.7–4.0)
MCH: 32.4 pg (ref 26.0–34.0)
MCHC: 35.4 g/dL (ref 30.0–36.0)
MCV: 91.7 fL (ref 80.0–100.0)
Monocytes Absolute: 0.3 10*3/uL (ref 0.1–1.0)
Monocytes Relative: 9 %
Neutro Abs: 2.3 10*3/uL (ref 1.7–7.7)
Neutrophils Relative %: 61 %
Platelets: 184 10*3/uL (ref 150–400)
RBC: 4.44 MIL/uL (ref 3.87–5.11)
RDW: 11.5 % (ref 11.5–15.5)
WBC: 3.7 10*3/uL — ABNORMAL LOW (ref 4.0–10.5)
nRBC: 0 % (ref 0.0–0.2)

## 2022-06-02 LAB — BASIC METABOLIC PANEL
Anion gap: 12 (ref 5–15)
BUN: 12 mg/dL (ref 8–23)
CO2: 25 mmol/L (ref 22–32)
Calcium: 10.3 mg/dL (ref 8.9–10.3)
Chloride: 101 mmol/L (ref 98–111)
Creatinine, Ser: 0.71 mg/dL (ref 0.44–1.00)
GFR, Estimated: >60 mL/min (ref >60)
Glucose, Bld: 115 mg/dL — ABNORMAL HIGH (ref 70–99)
Potassium: 4.1 mmol/L (ref 3.5–5.1)
Sodium: 138 mmol/L (ref 135–145)

## 2022-06-02 LAB — TROPONIN I (HIGH SENSITIVITY)
Troponin I (High Sensitivity): 8 ng/L (ref <18)
Troponin I (High Sensitivity): 10 ng/L (ref <18)

## 2022-06-02 LAB — CBG MONITORING, ED: Glucose-Capillary: 107 mg/dL — ABNORMAL HIGH (ref 70–99)

## 2022-06-02 MED ORDER — ACETAMINOPHEN 500 MG PO TABS
1000.0000 mg | ORAL_TABLET | Freq: Once | ORAL | Status: AC
  Administered 2022-06-02: 1000 mg via ORAL
  Filled 2022-06-02: qty 2

## 2022-06-02 MED ORDER — DIPHENHYDRAMINE HCL 50 MG/ML IJ SOLN
12.5000 mg | Freq: Once | INTRAMUSCULAR | Status: DC
  Filled 2022-06-02: qty 1

## 2022-06-02 MED ORDER — SODIUM CHLORIDE 0.9 % IV BOLUS
500.0000 mL | Freq: Once | INTRAVENOUS | Status: AC
  Administered 2022-06-02: 500 mL via INTRAVENOUS

## 2022-06-02 MED ORDER — IOHEXOL 350 MG/ML SOLN
100.0000 mL | Freq: Once | INTRAVENOUS | Status: AC | PRN
  Administered 2022-06-02: 50 mL via INTRAVENOUS

## 2022-06-02 MED ORDER — METOCLOPRAMIDE HCL 5 MG/ML IJ SOLN
5.0000 mg | Freq: Once | INTRAMUSCULAR | Status: DC
  Filled 2022-06-02: qty 2

## 2022-06-02 NOTE — ED Provider Notes (Signed)
Patient is feeling better after her CT scan.  She no longer has severe headache.  Also her CT scan was unremarkable.  I spoke to her about starting amlodipine and she stated she was on that before and her blood pressure dropped too much.  She is presently taking 12.5 mg of Toprol XL in the morning.  She used to take it twice a day.  I instructed her to check her blood pressure in the evening and if she is moderately elevated and she should take a second Toprol at 12.5 mg.  She is to follow-up with her doctor the end of the month   Milton Ferguson, MD 06/02/22 1700

## 2022-06-02 NOTE — Discharge Instructions (Signed)
Follow-up with your cardiologist as planned.  Check your blood pressure in the evening and if it is elevated you should take an extra 12.5 mg of your Toprol in the evening

## 2022-06-02 NOTE — ED Notes (Signed)
Pt ambulatory to bathroom. Pts bed was wet, new sheet and gown applied.

## 2022-06-02 NOTE — ED Provider Notes (Incomplete)
[facility] DEPARTMENT Provider Note   CSN: [id number] Arrival date & time: 06/02/22  1113     History {Add pertinent medical, surgical, social history, OB history to HPI:1} Chief Complaint  Patient presents with   Chest Pain    Shannon [name] is a 76 y.o. female.  76 yo F with a chief complaints of chest pain.  This occurred while she was at rest she also felt a bit lightheaded when it occurred.  She took a nitroglycerin and felt a bit worse and she checked her blood pressure and it was a bit low and she laid down on the couch.  She felt warm all over and EMS was called.  The chest pain is now resolved.  She is having a bit of an occipital headache.  She tells me that she has been having headaches with exertion for at least the past 6 months.  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  She actually had a cardiac catheterization in 48 hours ago.   Chest Pain      Home Medications Prior to Admission medications   Medication Sig Start Date End Date Taking? Authorizing Provider  acetaminophen (TYLENOL) 325 MG tablet Take 325 mg by mouth every 6 (six) hours as needed for headache.     [provider]  aspirin EC 81 MG EC tablet Take 1 tablet (81 mg total) by mouth daily. 12/10/18   Mayo, Pete Pelt, MD  atorvastatin (LIPITOR) 40 MG tablet Take 1 tablet (40 mg total) by mouth daily at 6 PM. 12/09/18   Mayo, Pete Pelt, MD  Calcium Carb-Cholecalciferol (CALCIUM 600 + D PO) Take 300-600 mg by mouth See admin instructions. Take 600 mg after breakfast, 600 mg after lunch, and 600 mg after dinner    [provider]  Coenzyme Q10 50 MG CAPS Take 50 mg by mouth daily.     [provider]  levothyroxine (SYNTHROID) 75 MCG tablet Take 75 mcg by mouth every Tuesday, Thursday, Saturday, and Sunday.     [provider]  levothyroxine (SYNTHROID, LEVOTHROID) 50 MCG tablet Take 50 mcg by mouth every Monday,  Wednesday, and Friday.  04/24/12   [provider]  loratadine (CLARITIN) 10 MG tablet Take 10 mg by mouth daily.    [provider]  metoprolol succinate (TOPROL-XL) 25 MG 24 hr tablet Take 0.5 tablets (12.5 mg total) by mouth daily. 01/21/22   Skeet Latch, MD  Multiple Vitamin (MULTIVITAMIN) capsule Take 1 capsule by mouth daily. Senior 04/24/12   [provider]  nitroGLYCERIN (NITROSTAT) 0.4 MG SL tablet Place 1 tablet (0.4 mg total) under the tongue every 5 (five) minutes as needed for chest pain. 05/20/22 08/18/22  Loel Dubonnet, NP  Polyvinyl Alcohol-Povidone PF (REFRESH) 1.4-0.6 % SOLN Apply 1 drop to eye as needed (dry eye). Both eyes    [provider]  Propylene Glycol (SYSTANE BALANCE OP) Apply 1 drop to eye as needed (dry eye). Both eyes.    [provider]  ticagrelor (BRILINTA) 90 MG TABS tablet Take 1 tablet (90 mg total) by mouth 2 (two) times daily. 05/31/22   Bhagat, Crista Luria, PA  vitamin E 400 UNIT capsule Take 400 Units by mouth every other day.     [provider]      Allergies    Elastic bandages & [zinc], Sulfa antibiotics, Tape, Mandol [cefamandole], and Sinutab non-drowsy max [pseudoephedrine-acetaminophen]  Review of Systems   Review of Systems  Cardiovascular:  Positive for chest pain.    Physical Exam Updated Vital Signs BP (!) 194/82   Pulse 79   Temp 98.1 F (36.7 C) (Oral)   Resp (!) 25   Ht '5\' 5"'$  (1.651 m)   Wt 52.2 kg   SpO2 98%   BMI 19.14 kg/m  Physical Exam Vitals and nursing note reviewed.  Constitutional:      General: She is not in acute distress.    Appearance: She is well-developed. She is not diaphoretic.  HENT:     Head: Normocephalic and atraumatic.  Eyes:     Pupils: Pupils are equal, round, and reactive to light.  Cardiovascular:     Rate and Rhythm: Normal rate and regular rhythm.     Heart sounds: No murmur heard.    No friction rub. No gallop.  Pulmonary:      Effort: Pulmonary effort is normal.     Breath sounds: No wheezing or rales.  Chest:     Chest wall: No tenderness.  Abdominal:     General: There is no distension.     Palpations: Abdomen is soft.     Tenderness: There is no abdominal tenderness.  Musculoskeletal:        General: No tenderness.     Cervical back: Normal range of motion and neck supple.     Comments: Hematoma to the right radial area.  Radial pulse intact.  Skin:    General: Skin is warm and dry.  Neurological:     Mental Status: She is alert and oriented to person, place, and time.  Psychiatric:        Behavior: Behavior normal.     ED Results / Procedures / Treatments   Labs (all labs ordered are listed, but only abnormal results are displayed) Labs Reviewed  BASIC METABOLIC PANEL  CBC WITH DIFFERENTIAL/PLATELET  CBG MONITORING, ED  TROPONIN I (HIGH SENSITIVITY)    EKG None  Radiology No results found.  Procedures Procedures  {Document cardiac monitor, telemetry assessment procedure when appropriate:1}  Medications Ordered in ED Medications  sodium chloride 0.9 % bolus 500 mL (has no administration in time range)    ED Course/ Medical Decision Making/ A&P                           Medical Decision Making Amount and/or Complexity of Data Reviewed Labs: ordered. Radiology: ordered. ECG/medicine tests: ordered.  Risk OTC drugs. Prescription drug management.   76 yo F with a chief complaints of sudden onset chest discomfort.  Patient took a nitro and it seems like that is resolved.  She has a residual headache afterwards.  Is felt a little bit fatigued today as well.  She tells me that she just had cardiac catheterization done.  On record review the patient was in fact evaluated by the cardiologist and had an RCA stent placed.  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  She has been having exertional headaches for at least 6 months.   Had planned to see her neurologist but it sounds like her appointment got pushed back.  No obvious neurologic deficit on my exam.  We will obtain a delta troponin.  EKG without obvious ischemic changes.  Chest x-ray reassess.  Discussed with cards.  Patient was seen by Dr. Angelena Form, thought to be less likely cardiac  in nature.  Thought not unreasonable to bring the patient to the hospital for monitoring of her blood pressure as its been quite elevated here.  I reassessed the patient and she still complaining mostly of occipital headache.  I had offered her headache cocktail but she declined because she was concerned it might make her too sleepy.  I offered to give her a dose of Tylenol.  We will obtain a CT angio of the head and neck to evaluate for acute intracranial pathology and evaluation of her prior stent placement.   {Document critical care time when appropriate:1} {Document review of labs and clinical decision tools ie heart score, Chads2Vasc2 etc:1}  {Document your independent review of radiology images, and any outside records:1} {Document your discussion with family members, caretakers, and with consultants:1} {Document social determinants of health affecting pt's care:1} {Document your decision making why or why not admission, treatments were needed:1} Final Clinical Impression(s) / ED Diagnoses Final diagnoses:  None    Rx / DC Orders ED Discharge Orders     None

## 2022-06-02 NOTE — Consult Note (Addendum)
Cardiology Consultation:   Patient ID: Shannon [name] MRN: [id number]; DOB: 11-08-1946  Admit date: 06/02/2022 Date of Consult: 06/02/2022  PCP:  Donalynn Furlong, MD   [facility] Providers Cardiologist:  Buford Dresser, MD   {   Patient Profile:   Shannon [name] is a 76 y.o. female with a hx of CAD s/p DES to RCA 05/31/22, HTN, CVA who is being seen 06/02/2022 for the evaluation of chest pain at the request of Dr. Tyrone Nine.  Prior cardiac cath (date unknown) with reported 30% RCA lesion. Prior echo 2019 with normal LVEF, negative bubble study. Previous ETT 02/26/21 with fair exercise tolerance, 4m up-sloping to horizontal ST depressions in inferior lateral leads of borderline significance.  Due to chest pain cardiac CTA performed 12/03/2021 with coronary calcium score 150 placing her in the 67th percentile for age and sex matched control.  She had noncalcified plaques in the proximal RCA with moderate 50 to 69% stenosis, calcified plaque in the proximal RCA, LAD, proximal LCx with 0 to 24% stenosis.  Noted PFO.  FFR on RCA of 0.8 suggesting borderline likelihood of hemodynamic significance.  She was started on Imdur but did not tolerate.  Previously did not tolerate increased doses of metoprolol or diltiazem.     Reported exertional chest pressure after walking concerning for angina.  She was not able to complete her morning walk as well.  Cardiac catheterization was arranged for further evaluation showing 75% stenosis proximal RCA treated with DES placement 05/31/22.  Discharged home same day.  No immediate complication.  She had mild hematoma and advised to put compression and hand elevation.   Hx of stroke in 2019. Symptomatic right MCA stenosis symptomatic despite dual platelet anticoagulation. s/p revascularization of the right middle cerebral artery distal to M1 extending into the dominant inferior division proximally  with stent on 1.23.20. 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  Most recently had an image-guided cerebral arteriogram with revascularization of left VA stenosis using stent assisted angioplasty via right femoral approach by Dr. DEstanislado Pandy12/05/2020.  Patient reports chronic headache and intermittent memory issue.  History of Present Illness:   [name] well yesterday except mild hematoma.  She was unable to sleep last night due to issue with her dog.  This morning she did Bible study and after completion she had severe substernal chest pressure with shortness of breath and diaphoresis.  Rated  chest pressure as 5 out of 10.  Felt like prior angina requiring cardiac catheterization but less intense.  She took sublingual nitroglycerin x1 which eventually improved of symptoms.  However her systolic blood pressure dropped to 90 and developed very severe headache with bubblelike sensation. EMS was called.  Felt she could have anxiety.  Brought to ER for further evaluation.  Currently her systolic blood pressure above 220.  No chest pain but complains of headache.  Past Medical History:  Diagnosis Date   Anxiety    Artery stenosis ([place]    cerebral   Arthritis    Cancer ([place]    skin - squamous cell   Complication of anesthesia    " I had difficulty coming out of it"   Dyspnea    with exertion   Family history of adverse reaction to anesthesia    " My daughter has a problem coming ou to it"   GERD (gastroesophageal reflux disease)    Headache    migraines in  the past   Hypertension    Hypothyroidism    Stroke [facility]) 11/2018   some weakness on right side    SVT (supraventricular tachycardia) ([place])    Thyroid disease     Past Surgical History:  Procedure Laterality Date   ABDOMINAL HYSTERECTOMY     APPENDECTOMY     CARDIAC CATHETERIZATION     CARPAL TUNNEL RELEASE Bilateral    COLONOSCOPY     CORONARY STENT INTERVENTION N/A 05/31/2022    Procedure: CORONARY STENT INTERVENTION;  Surgeon: Jettie Booze, MD;  Location: [place] [facility];  Service: Cardiovascular;  Laterality: N/A;   DILATION AND CURETTAGE OF UTERUS     INTRAVASCULAR ULTRASOUND/IVUS N/A 05/31/2022   Procedure: Intravascular Ultrasound/IVUS;  Surgeon: Jettie Booze, MD;  Location: [place] [facility];  Service: Cardiovascular;  Laterality: N/A;   IR ANGIO INTRA EXTRACRAN SEL COM CAROTID INNOMINATE BILAT MOD SED  12/25/2018   IR ANGIO INTRA EXTRACRAN SEL COM CAROTID INNOMINATE BILAT MOD SED  10/30/2019   IR ANGIO INTRA EXTRACRAN SEL COM CAROTID INNOMINATE BILAT MOD SED  09/23/2020   IR ANGIO VERTEBRAL SEL SUBCLAVIAN INNOMINATE UNI L MOD SED  12/25/2018   IR ANGIO VERTEBRAL SEL SUBCLAVIAN INNOMINATE UNI L MOD SED  10/30/2019   IR ANGIO VERTEBRAL SEL SUBCLAVIAN INNOMINATE UNI L MOD SED  09/23/2020   IR ANGIO VERTEBRAL SEL SUBCLAVIAN INNOMINATE UNI R MOD SED  01/04/2019   IR ANGIO VERTEBRAL SEL VERTEBRAL UNI R MOD SED  12/25/2018   IR ANGIO VERTEBRAL SEL VERTEBRAL UNI R MOD SED  10/30/2019   IR ANGIO VERTEBRAL SEL VERTEBRAL UNI R MOD SED  09/23/2020   IR CT HEAD LTD  01/04/2019   IR CT HEAD LTD  11/21/2019   IR INTRA CRAN STENT  01/04/2019   IR INTRA CRAN STENT  11/21/2019   IR RADIOLOGIST EVAL & MGMT  03/12/2021   IR RADIOLOGIST EVAL & MGMT  09/10/2021   IR TRANSCATH EXCRAN VERT OR CAR A STENT  11/17/2020   IR US GUIDE VASC ACCESS RIGHT  12/25/2018   IR US GUIDE VASC ACCESS RIGHT  11/02/2019   LEFT HEART CATH AND CORONARY ANGIOGRAPHY N/A 05/31/2022   Procedure: LEFT HEART CATH AND CORONARY ANGIOGRAPHY;  Surgeon: Jettie Booze, MD;  Location: [place] [facility];  Service: Cardiovascular;  Laterality: N/A;   OVARIAN CYST REMOVAL     RADIOLOGY WITH ANESTHESIA N/A 01/04/2019   Procedure: RMCA angioplasty with possible stenting;  Surgeon: Luanne Bras, MD;  Location: [place];  Service: Radiology;  Laterality: N/A;   RADIOLOGY WITH ANESTHESIA N/A 11/21/2019    Procedure: IR WITH ANESTHESIA/ STENT PLACEMENT;  Surgeon: Luanne Bras, MD;  Location: [place];  Service: Radiology;  Laterality: N/A;   RADIOLOGY WITH ANESTHESIA N/A 10/01/2020   Procedure: IR WITH ANESTHESIA ANGIOPLASTY WITH POSSIBLE STENTING;  Surgeon: Luanne Bras, MD;  Location: [place];  Service: Radiology;  Laterality: N/A;   RADIOLOGY WITH ANESTHESIA N/A 11/17/2020   Procedure: IR WITH ANESTHESIA STENT PLACEMENT;  Surgeon: Luanne Bras, MD;  Location: [place];  Service: Radiology;  Laterality: N/A;     Inpatient Medications: Scheduled Meds:  diphenhydrAMINE  12.5 mg Intravenous Once   metoCLOPramide (REGLAN) injection  5 mg Intravenous Once   Continuous Infusions:  PRN Meds: Allergies:    Allergies  Allergen Reactions   Elastic Bandages & [Zinc] Itching   Sulfa Antibiotics Other (See Comments)    Tongue turned black   Tape  Pulled blood through the skin- happened during a procedure in Oct 2021    Mandol [Cefamandole] Rash   Sinutab Non-Drowsy Max [Pseudoephedrine-Acetaminophen] Palpitations    Social History:   Social History   Socioeconomic History   Marital status: Married    Spouse name: Not on file   Number of children: Not on file   Years of education: Not on file   Highest education level: Not on file  Occupational History   Occupation: retired  Tobacco Use   Smoking status: Never   Smokeless tobacco: Never  Vaping Use   Vaping Use: Never used  Substance and Sexual Activity   Alcohol use: Never   Drug use: Never   Sexual activity: Yes  Other Topics Concern   Not on file  Social History Narrative   Not on file   Social Determinants of Health   Financial Resource Strain: Not on file  Food Insecurity: Not on file  Transportation Needs: Not on file  Physical Activity: Not on file  Stress: Not on file  Social Connections: Not on file  Intimate Partner Violence: Not on file    Family History:    Family History  Problem Relation  Age of Onset   CVA Mother    Heart disease Mother    Peripheral Artery Disease Father    Breast cancer Sister    Breast cancer Sister    CVA Maternal Aunt      ROS:  Please see the history of present illness.  All other ROS reviewed and negative.     Physical Exam/Data:   Vitals:   06/02/22 1215 06/02/22 1230 06/02/22 1245 06/02/22 1300  BP: (!) 150/73 (!) 174/75 (!) 164/72 (!) 172/65  Pulse: 72 74 75 76  Resp: '18 19 17 20  '$ Temp:      TempSrc:      SpO2: 98% 97% 98% 98%  Weight:      Height:       No intake or output data in the 24 hours ending 06/02/22 1407    06/02/2022   11:21 AM 05/31/2022    5:42 AM 05/20/2022   10:52 AM  Last 3 Weights  Weight (lbs) 115 lb 115 lb 117 lb  Weight (kg) 52.164 kg 52.164 kg 53.071 kg     Body mass index is 19.14 kg/m.  General:  Well nourished, well developed, in no acute distress HEENT: normal Neck: no JVD Vascular: No carotid bruits; Distal pulses 2+ bilaterally Cardiac:  normal S1, S2; RRR; no murmur.  Radial cath site ecchymosis Lungs:  clear to auscultation bilaterally, no wheezing, rhonchi or rales  Abd: soft, nontender, no hepatomegaly  Ext: no edema Musculoskeletal:  No deformities, BUE and BLE strength normal and equal Skin: warm and dry  Neuro:  CNs 2-12 intact, no focal abnormalities noted Psych:  Normal affect   EKG:  The EKG was personally reviewed and demonstrates:  sinus rhythm, PAC Telemetry:  Telemetry was personally reviewed and demonstrates:  sinus rhythm  Relevant CV Studies:  Cardiac Catheterization 05/31/2022: CORONARY STENT INTERVENTION  Intravascular Ultrasound/IVUS  LEFT HEART CATH AND CORONARY ANGIOGRAPHY    Conclusion       Prox RCA lesion is 75% stenosed.  A drug-eluting stent was successfully placed using a SYNERGY XD 3.0X16, postdilated to 3.25 mm and optimized with IVUS.   Post intervention, there is a 0% residual stenosis.   Dist LAD lesion is 10% stenosed.   Mid Cx to Dist Cx lesion is  10%  stenosed.   The left ventricular systolic function is normal.   LV end diastolic pressure is normal.   The left ventricular ejection fraction is 55-65% by visual estimate.   There is no aortic valve stenosis.   Successful PCI of the proximal RCA.  Patient was on a very low-dose of Brilinta in the past.  Apparently, she was a Plavix nonresponder.  She will need to go back to Brilinta 90 mg twice a day for 6 months, ideally.  Could consider 3 months if there were bleeding issues.  After that time, her Brilinta could be reduced.  Will CC Dr. Estanislado Pandy.   Continue aggressive secondary prevention. Diagnostic Dominance: Right  Intervention        Laboratory Data:  High Sensitivity Troponin:   Recent Labs  Lab 06/02/22 1130  TROPONINIHS 8     Chemistry Recent Labs  Lab 06/02/22 1130  NA 138  K 4.1  CL 101  CO2 25  GLUCOSE 115*  BUN 12  CREATININE 0.71  CALCIUM 10.3  GFRNONAA >60  ANIONGAP 12    Hematology Recent Labs  Lab 06/02/22 1130  WBC 3.7*  RBC 4.44  HGB 14.4  HCT 40.7  MCV 91.7  MCH 32.4  MCHC 35.4  RDW 11.5  PLT 184   Radiology/Studies:  DG Chest Portable 1 View  Result Date: 06/02/2022 CLINICAL DATA:  Chest pressure EXAM: PORTABLE CHEST 1 VIEW COMPARISON:  12/26/2018 FINDINGS: The heart size and mediastinal contours are within normal limits. No acute airspace opacity. Benign, calcified small nodules of the right midlung. The visualized skeletal structures are unremarkable. IMPRESSION: No acute abnormality of the lungs in AP portable projection. Electronically Signed   By: Delanna Ahmadi M.D.   On: 06/02/2022 11:53   CARDIAC CATHETERIZATION  Result Date: 05/31/2022   Prox RCA lesion is 75% stenosed.  A drug-eluting stent was successfully placed using a SYNERGY XD 3.0X16, postdilated to 3.25 mm and optimized with IVUS.   Post intervention, there is a 0% residual stenosis.   Dist LAD lesion is 10% stenosed.   Mid Cx to Dist Cx lesion is 10% stenosed.    The left ventricular systolic function is normal.   LV end diastolic pressure is normal.   The left ventricular ejection fraction is 55-65% by visual estimate.   There is no aortic valve stenosis. Successful PCI of the proximal RCA.  Patient was on a very low-dose of Brilinta in the past.  Apparently, she was a Plavix nonresponder.  She will need to go back to Brilinta 90 mg twice a day for 6 months, ideally.  Could consider 3 months if there were bleeding issues.  After that time, her Brilinta could be reduced.  Will CC Dr. Estanislado Pandy. Continue aggressive secondary prevention.     Assessment and Plan:   Chest pressure - Like prior angina but much less intensity.  Took sublingual nitroglycerin with resolution of symptoms.  Troponin negative.  EKG without acute ischemic changes.  Per husband EMS felt her symptoms could be due to anxiety.  She is under stress. -If delta troponin negative no further cardiac work-up recommended -She is intolerance to Imdur and higher dose of metoprolol or diltiazem -Continue aspirin and Brilinta -Continue statin -Cath site without hematoma.  She does have mild to moderate ecchymosis.  Recommended to avoid extraneous activity, and had elevation  2.  CAD s/p DES to RCA 05/31/2022 -Continue aspirin and Brilinta -Continue statin  3.  Headache -Patient has chronic headache since her  stroke and IR cerebral intervention -Felt worse headache when her blood pressure dropped to 43X systolically after taking nitroglycerin.  Currently her systolic blood pressure greater than 210s -Also reports some memory issue -Recommended work-up per ER team  4. Hypertension -Systolic blood pressure dropping to 90s after taking sublingual nitroglycerin x1 -Currently systolic blood pressure greater than 210 -will need better control of her blood pressure.  Could consider low-dose amlodipine.  She did not tolerated higher dose of beta-blocker and diltiazem previously.   Dr. Angelena Form to  see.   For questions or updates, please contact [place] Please consult [email or web address] for contact info under    Jarrett Soho, PA  06/02/2022 2:07 PM   I have personally seen and examined this patient. I agree with the assessment and plan as outlined above.  Pt presenting to ED with c/o anxiety, chest pain, headache. BP at home was in the 90s. Here in the ED her systolic BP has been 540. Recent cardiac cath earlier this week with placement of a DES in the proximal RCA. She did well following the PCI. Much stress at home. She has not been sleeping well. She had chest pain and dyspnea today at rest. EMS brought her to the ED. Chest pain resolved with one NTG. Chest pain now resolved but ongoing headache.  EKG does not show ischemic changes Troponin negative.  My exam: CV:RRR without murmurs. Pulm: clear bilaterally. Ext: no LE edema Plan: Atypical chest pain. No evidence of ACS. I do not suspect that this is a problem with her new coronary stent.  Her elevated BP will need to be addressed. Can consider adding Norvasc 5 mg daily. Consider overnight admission to medicine service if felt necessary for management of her BP and headache.   Lauree Chandler, MD, [facility] 06/02/2022 3:11 PM

## 2022-06-02 NOTE — ED Triage Notes (Addendum)
Pt BIB EMS due to chest pain and SOB. Pt had stents placed on Monday and has had head pressure since. Pt took nitro for chest pain. Pt states it took chest pain away. VSS. Axox4. Pt takes 90 mg of brillinta BID

## 2022-06-03 ENCOUNTER — Other Ambulatory Visit (HOSPITAL_COMMUNITY): Payer: Self-pay

## 2022-06-11 ENCOUNTER — Encounter (HOSPITAL_BASED_OUTPATIENT_CLINIC_OR_DEPARTMENT_OTHER): Payer: Self-pay | Admitting: Family

## 2022-06-11 ENCOUNTER — Ambulatory Visit (INDEPENDENT_AMBULATORY_CARE_PROVIDER_SITE_OTHER): Payer: Medicare Other | Admitting: Family

## 2022-06-11 VITALS — BP 138/72 | HR 81 | Ht 65.0 in | Wt 117.0 lb

## 2022-06-11 DIAGNOSIS — I25118 Atherosclerotic heart disease of native coronary artery with other forms of angina pectoris: Secondary | ICD-10-CM | POA: Diagnosis not present

## 2022-06-11 DIAGNOSIS — E039 Hypothyroidism, unspecified: Secondary | ICD-10-CM

## 2022-06-11 DIAGNOSIS — Q2112 Patent foramen ovale: Secondary | ICD-10-CM

## 2022-06-11 DIAGNOSIS — R002 Palpitations: Secondary | ICD-10-CM

## 2022-06-11 DIAGNOSIS — I10 Essential (primary) hypertension: Secondary | ICD-10-CM

## 2022-06-11 MED ORDER — TICAGRELOR 90 MG PO TABS: 90.0000 mg | ORAL_TABLET | Freq: Two times a day (BID) | ORAL | 3 refills | Status: DC

## 2022-06-11 NOTE — Patient Instructions (Signed)
Medication Instructions:  Continue your current medications.   *If you need a refill on your cardiac medications before your next appointment, please call your pharmacy*   Lab Work: None ordered today.   Testing/Procedures: Your EKG today showed normal sinus rhythm which is a good result!   Follow-Up: At [facility], you and your health needs are our priority.  As part of our continuing mission to provide you with exceptional heart care, we have created designated Provider Care Teams.  These Care Teams include your primary Cardiologist (physician) and Advanced Practice Providers (APPs -  Physician Assistants and Nurse Practitioners) who all work together to provide you with the care you need, when you need it.  We recommend signing up for the patient portal called "MyChart".  Sign up information is provided on this After Visit Summary.  MyChart is used to connect with patients for Virtual Visits (Telemedicine).  Patients are able to view lab/test results, encounter notes, upcoming appointments, etc.  Non-urgent messages can be sent to your provider as well.   To learn more about what you can do with MyChart, go to [email or web address].    Your next appointment:   3 month(s)  The format for your next appointment:   In Person  Provider:   Buford Dresser, MD or Loel Dubonnet, NP     Other Instructions  Heart Healthy Diet Recommendations: A low-salt diet is recommended. Meats should be grilled, baked, or boiled. Avoid fried foods. Focus on lean protein sources like fish or chicken with vegetables and fruits. The American Heart Association is a Microbiologist!  American Heart Association Diet and Lifeystyle Recommendations  Gradually increase activity. Cardiac rehab will call you to start.  Exercise recommendations: The American Heart Association recommends 150 minutes of moderate intensity exercise weekly. Try 30 minutes of moderate intensity exercise 4-5 times per  week. This could include walking, jogging, or swimming.   Important Information About Sugar

## 2022-06-11 NOTE — Progress Notes (Signed)
Office Visit    Patient Name: Shannon [name] Date of Encounter: 06/11/2022  PCP:  Donalynn Furlong, [facility]  Cardiologist:  Buford Dresser, MD  Advanced Practice Provider:  No care team member to display Electrophysiologist:  None    Chief Complaint    Shannon [name] is a 76 y.o. female with a hx of hypertension, CVA, CAD presents today for follow up after LHC.   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) 11/2018   some weakness on right side    SVT (supraventricular tachycardia) ([place])    Thyroid disease    Past Surgical History:  Procedure Laterality Date   ABDOMINAL HYSTERECTOMY     APPENDECTOMY     CARDIAC CATHETERIZATION     CARPAL TUNNEL RELEASE Bilateral    COLONOSCOPY     CORONARY STENT INTERVENTION N/A 05/31/2022   Procedure: CORONARY STENT INTERVENTION;  Surgeon: Jettie Booze, MD;  Location: [facility];  Service: Cardiovascular;  Laterality: N/A;   DILATION AND CURETTAGE OF UTERUS     INTRAVASCULAR ULTRASOUND/IVUS N/A 05/31/2022   Procedure: Intravascular Ultrasound/IVUS;  Surgeon: Jettie Booze, MD;  Location: [facility];  Service: Cardiovascular;  Laterality: N/A;   IR ANGIO INTRA EXTRACRAN SEL COM CAROTID INNOMINATE BILAT MOD SED  12/25/2018   IR ANGIO INTRA EXTRACRAN SEL COM CAROTID INNOMINATE BILAT MOD SED  10/30/2019   IR ANGIO INTRA EXTRACRAN SEL COM CAROTID INNOMINATE BILAT MOD SED  09/23/2020   IR ANGIO VERTEBRAL SEL SUBCLAVIAN INNOMINATE UNI L  MOD SED  12/25/2018   IR ANGIO VERTEBRAL SEL SUBCLAVIAN INNOMINATE UNI L MOD SED  10/30/2019   IR ANGIO VERTEBRAL SEL SUBCLAVIAN INNOMINATE UNI L MOD SED  09/23/2020   IR ANGIO VERTEBRAL SEL SUBCLAVIAN INNOMINATE UNI R MOD SED  01/04/2019   IR ANGIO VERTEBRAL SEL VERTEBRAL UNI R MOD SED  12/25/2018   IR ANGIO VERTEBRAL SEL VERTEBRAL UNI R MOD SED  10/30/2019   IR ANGIO VERTEBRAL SEL VERTEBRAL UNI R MOD SED  09/23/2020   IR CT HEAD LTD  01/04/2019   IR CT HEAD LTD  11/21/2019   IR INTRA CRAN STENT  01/04/2019   IR INTRA CRAN STENT  11/21/2019   IR RADIOLOGIST EVAL & MGMT  03/12/2021   IR RADIOLOGIST EVAL & MGMT  09/10/2021   IR TRANSCATH EXCRAN VERT OR CAR A STENT  11/17/2020   IR US GUIDE VASC ACCESS RIGHT  12/25/2018   IR US GUIDE VASC ACCESS RIGHT  11/02/2019   LEFT HEART CATH AND CORONARY ANGIOGRAPHY N/A 05/31/2022   Procedure: LEFT HEART CATH AND CORONARY ANGIOGRAPHY;  Surgeon: Jettie Booze, MD;  Location: [facility];  Service: Cardiovascular;  Laterality: N/A;   OVARIAN CYST REMOVAL     RADIOLOGY WITH ANESTHESIA N/A 01/04/2019   Procedure: RMCA angioplasty  with possible stenting;  Surgeon: Luanne Bras, MD;  Location: [facility];  Service: Radiology;  Laterality: N/A;   RADIOLOGY WITH ANESTHESIA N/A 11/21/2019   Procedure: IR WITH ANESTHESIA/ STENT PLACEMENT;  Surgeon: Luanne Bras, MD;  Location: [facility];  Service: Radiology;  Laterality: N/A;   RADIOLOGY WITH ANESTHESIA N/A 10/01/2020   Procedure: IR WITH ANESTHESIA ANGIOPLASTY WITH POSSIBLE STENTING;  Surgeon: Luanne Bras, MD;  Location: [facility];  Service: Radiology;  Laterality: N/A;   RADIOLOGY WITH ANESTHESIA N/A 11/17/2020   Procedure: IR WITH ANESTHESIA STENT PLACEMENT;  Surgeon: Luanne Bras, MD;  Location: [facility];  Service: Radiology;  Laterality: N/A;   Allergies  Allergies  Allergen Reactions   Elastic Bandages & [Zinc] Itching   Sulfa Antibiotics Other (See Comments)    Tongue turned black   Tape      Pulled blood through the skin- happened during a procedure in Oct 2021    Mandol [Cefamandole] Rash   Sinutab Non-Drowsy Max [Pseudoephedrine-Acetaminophen] Palpitations   History of Present Illness    Shannon [name] is a 76 y.o. female with a hx of hypertension, CVA, CAD last seen for [facility] 05/31/22.   Prior cardiac cath (date unknown) with reported 30% RCA lesion. Prior echo 2019 with normal LVEF, negative bubble study. Previous ETT 02/26/21 with fair exercise tolerance, 73m up-sloping to horizontal ST depressions in inferior lateral leads of borderline significance.  Due to chest pain cardiac CTA performed 12/03/2021 with coronary calcium score 150 placing her in the 67th percentile for age and sex matched control.  She had noncalcified plaques in the proximal RCA with moderate 50 to 69% stenosis, calcified plaque in the proximal RCA, LAD, proximal LCx with 0 to 24% stenosis.  Noted PFO.  FFR on RCA of 0.8 suggesting borderline likelihood of hemodynamic significance.  She was started on Imdur but did not tolerate.  Previously did not tolerate increased doses of metoprolol.  She was seen 12/2021 by Dr. CHarrell Gave  She noted no chest pain.  She noted only mild swelling in her legs at the end of the day if she was on her feet most of the day.  She does note an episode of lightheadedness at which time her blood pressure was 122/68.No changes were made at that time.  At follow up 05/20/22 noted exertional dyspnea and chest pain. Cardiac cath ordered and performed with PCI/DES to prox RCA, nonobstructive disease (dist LAD, mid-dist Cx 10%). Discharged on Brilinta '90mg'$  BID for at least 6 months though 3 months could be considered if bleeding issues.   ED visit 06/02/22 chest pain and headache. She had taken PRN nitroglycerin at home but then had hypotension. She was hypertensive in the ED. HS troponin unremarkable. CT head unremarkable.   Presents today for follow up with her husband. Since cardiac  catheterization still feeling slightly fatigued. Since ED visit no recurrent chest pain. Did have "blood blister" pop up on ulnar surface on her arm after removing bandage, no bleeding and no hematoma. Now just a small dark purple bruise 0.5"x0.5". She did bring her patient assistance for Brilinta to be completed today. Went and walked this morning 13-14 minutes. Hopeful to participate in cardiac rehab.    EKGs/Labs/Other Studies Reviewed:   The following studies were reviewed today:  LHC 05/2022   Prox RCA lesion is 75% stenosed.  A drug-eluting stent was successfully placed using a SYNERGY XD 3.0X16, postdilated to 3.25 mm and optimized with IVUS.   Post intervention, there is a 0%  residual stenosis.   Dist LAD lesion is 10% stenosed.   Mid Cx to Dist Cx lesion is 10% stenosed.   The left ventricular systolic function is normal.   LV end diastolic pressure is normal.   The left ventricular ejection fraction is 55-65% by visual estimate.   There is no aortic valve stenosis.   Successful PCI of the proximal RCA.  Patient was on a very low-dose of Brilinta in the past.  Apparently, she was a Plavix nonresponder.  She will need to go back to Brilinta 90 mg twice a day for 6 months, ideally.  Could consider 3 months if there were bleeding issues.  After that time, her Brilinta could be reduced.  Will CC Dr. Estanislado Pandy.   Continue aggressive secondary prevention.   Monitor 08/19/20 7 days of data recorded on Zio monitor. Patient had a min HR of 50 bpm, max HR of 158 bpm, and avg HR of 85 bpm. Predominant underlying rhythm was Sinus Rhythm. No VT, atrial fibrillation, high degree block, or pauses noted. Isolated atrial and ventricular ectopy was rare (<1%). 524 Supraventricular Tachycardia runs occurred, the run with the fastest interval lasting 6 beats with a max rate of 158 bpm, the longest lasting 19 beats with an avg rate of 104 bpm.   ETT 02/26/21 Blood pressure demonstrated a normal response to  exercise. Upsloping ST segment depression ST segment depression of 1 mm was noted during stress in the V5, V6, III and aVF leads.   ETT with fair exercise tolerance (6:19); no chest pain; normal blood pressure response; study terminated secondary to dyspnea; 1 mm of upsloping to horizontal ST depression in the inferior lateral leads of borderline significance; patient achieved 74% (inadequate) predicted maximum heart rate; suggest alternative study if clinical suspicion of ischemia high (nuclear study or cardiac CTA).   Coronary CT/Calcium Score 12/03/2021: Coronary Arteries:  Normal coronary origin.  Right dominance.   RCA is a large dominant artery that gives rise to PDA and PLA. There is noncalcified plaque in the proximal RCA causing 50-69% stenosis. Calcified plaque in the proximal RCA causes 0-24% stenosis   Left main is a large artery that gives rise to LAD and LCX arteries.   LAD is a large vessel. There is calcified plaque in the proximal LAD causing 0-24% stenosis   LCX is a non-dominant artery that gives rise to one large OM1 branch. There is calcified plaque in the proximal LCX causing 0-24% stenosis   Other findings:   Left Ventricle: Normal size   Left Atrium: Mild enlargement. PFO   Pulmonary Veins: Normal configuration   Right Ventricle: Normal size   Right Atrium: Normal size   Cardiac valves: No calcifications   Thoracic aorta: Normal size   Pulmonary Arteries: Normal size   Systemic Veins: Normal drainage   Pericardium: Normal thickness   IMPRESSION: 1. Coronary calcium score of 150. This was 67th percentile for age and sex matched control.   2.  Normal coronary origin with right dominance.   3. Noncalcified plaque in the proximal RCA causes moderate (50-69%) stenosis   4. Calcified plaque in the proximal RCA, proximal LAD, and proximal LCX causes minimal (0-24%) stenosis   5.  PFO   6.  Will send for CT FFR   CAD-RADS 3. Moderate  stenosis. Consider symptom-guided anti-ischemic pharmacotherapy as well as risk factor modification per guideline directed care. Additional analysis with CT FFR will be submitted.    EKG:  EKG is ordered today.  The ekg ordered today demonstrates normal sinus and 81 bpm with no acute ST/T wave changes.  Recent Labs: 10/28/2021: Magnesium 2.4; TSH 0.773 06/02/2022: BUN 12; Creatinine, Ser 0.71; Hemoglobin 14.4; Platelets 184; Potassium 4.1; Sodium 138  Recent Lipid Panel    Component Value Date/Time   CHOL 199 12/09/2018 0352   TRIG 110 12/09/2018 0352   HDL 45 12/09/2018 0352   CHOLHDL 4.4 12/09/2018 0352   VLDL 22 12/09/2018 0352   LDLCALC 132 (H) 12/09/2018 0352   Home Medications   Current Meds  Medication Sig   acetaminophen (TYLENOL) 325 MG tablet Take 325 mg by mouth every 6 (six) hours as needed for headache.    aspirin EC 81 MG EC tablet Take 1 tablet (81 mg total) by mouth daily.   atorvastatin (LIPITOR) 40 MG tablet Take 1 tablet (40 mg total) by mouth daily at 6 PM.   Calcium Carb-Cholecalciferol (CALCIUM 600 + D PO) Take 300-600 mg by mouth See admin instructions. Take 600 mg after breakfast, 600 mg after lunch, and 600 mg after dinner   Coenzyme Q10 50 MG CAPS Take 50 mg by mouth daily.    levothyroxine (SYNTHROID) 75 MCG tablet Take 75 mcg by mouth every Tuesday, Thursday, Saturday, and Sunday.    levothyroxine (SYNTHROID, LEVOTHROID) 50 MCG tablet Take 50 mcg by mouth every Monday, Wednesday, and Friday.    loratadine (CLARITIN) 10 MG tablet Take 10 mg by mouth daily.   metoprolol succinate (TOPROL-XL) 25 MG 24 hr tablet Take 0.5 tablets (12.5 mg total) by mouth daily.   Multiple Vitamin (MULTIVITAMIN) capsule Take 1 capsule by mouth daily. Senior   nitroGLYCERIN (NITROSTAT) 0.4 MG SL tablet Place 1 tablet (0.4 mg total) under the tongue every 5 (five) minutes as needed for chest pain.   Polyvinyl Alcohol-Povidone PF (REFRESH) 1.4-0.6 % SOLN Apply 1 drop to eye as  needed (dry eye). Both eyes   Propylene Glycol (SYSTANE BALANCE OP) Apply 1 drop to eye as needed (dry eye). Both eyes.   ticagrelor (BRILINTA) 90 MG TABS tablet Take 1 tablet (90 mg total) by mouth 2 (two) times daily.   vitamin E 400 UNIT capsule Take 400 Units by mouth every other day.      Review of Systems      All other systems reviewed and are otherwise negative except as noted above.  Physical Exam    VS:  BP 138/72   Pulse 81   Ht '5\' 5"'$  (1.651 m)   Wt 117 lb (53.1 kg)   BMI 19.47 kg/m  , BMI Body mass index is 19.47 kg/m.  Wt Readings from Last 3 Encounters:  06/11/22 117 lb (53.1 kg)  06/02/22 115 lb (52.2 kg)  05/31/22 115 lb (52.2 kg)   GEN: Well nourished, well developed, in no acute distress. HEENT: normal. Neck: Supple, no JVD, carotid bruits, or masses. Cardiac: RRR, no murmurs, rubs, or gallops. No clubbing, cyanosis, edema.  Radials/PT 2+ and equal bilaterally.  Respiratory:  Respirations regular and unlabored, clear to auscultation bilaterally. GI: Soft, nontender, nondistended. MS: No deformity or atrophy. Skin: Warm and dry, no rash. R radial cath site healing well with yellowed ecchymosis and no hematoma. "Blood blister" next to cath site is healing without evidence of infection.  Neuro:  Strength and sensation are intact. Anxious.  Psych: Normal affect.  Assessment & Plan    Coronary artery disease - PCI/DES RCA 05/31/22. Cath site healing well with mild yellowed ecchymosis. No hematoma. Continue Aspirin/Brilinta  $'90mg'S$  BID. Additional GDMT includes ASA, Atorvastatin, PRN nitroglycerin. Appropriate to begin cardiac rehab.   Palpitations / Tachycardia / Bradycardia / Sinus pause 1.68 sec - Previous monitor with paroxymal SVT. Continue Toprol 12.'5mg'$  QD. Did not tolerate increased doses nor Diltiazem. Continue to stay well hydrated, avoid caffeine, manage stress well.  PFO -incidental finding on CT.  Negative bubble study on prior echo.  History of stroke  but was due to ischemia/stenosis. No indication for closure.   HTN - Labile blood pressure at home. But overall well controlled 110s-130s. Per Dr. Harrell Gave base antihypertensive regimen on home readings as element of [place] coat hypertension. BP reasonably well controlled in clinic - continue current regimen. If BP labile could consider 24 hour BP monitor.   Hypothyroidism - Managed by primary care.   History of CVA - On Aspirin, Brilinta per interventional neurology. Follows with neurology.   Disposition: Follow up in 3 mos with Buford Dresser, MD or APP.  Signed, Loel Dubonnet, NP 06/11/2022, 2:48 PM [facility]

## 2022-06-14 ENCOUNTER — Ambulatory Visit (HOSPITAL_BASED_OUTPATIENT_CLINIC_OR_DEPARTMENT_OTHER): Payer: Medicare Other | Admitting: Family

## 2022-06-16 ENCOUNTER — Telehealth (HOSPITAL_BASED_OUTPATIENT_CLINIC_OR_DEPARTMENT_OTHER): Payer: Self-pay

## 2022-06-16 NOTE — Telephone Encounter (Signed)
Received documentation via fax that [place] patient assistance approved, patient updated via mychart.

## 2022-06-17 ENCOUNTER — Other Ambulatory Visit (HOSPITAL_BASED_OUTPATIENT_CLINIC_OR_DEPARTMENT_OTHER): Payer: Self-pay

## 2022-06-17 ENCOUNTER — Telehealth (HOSPITAL_BASED_OUTPATIENT_CLINIC_OR_DEPARTMENT_OTHER): Payer: Self-pay

## 2022-06-17 ENCOUNTER — Encounter: Payer: Self-pay | Admitting: *Deleted

## 2022-06-17 ENCOUNTER — Encounter: Payer: Medicare Other | Attending: Cardiology | Admitting: *Deleted

## 2022-06-17 DIAGNOSIS — Z955 Presence of coronary angioplasty implant and graft: Secondary | ICD-10-CM | POA: Insufficient documentation

## 2022-06-17 DIAGNOSIS — Z48812 Encounter for surgical aftercare following surgery on the circulatory system: Secondary | ICD-10-CM | POA: Insufficient documentation

## 2022-06-17 NOTE — Progress Notes (Signed)
Virtual orientation call completed today. shehas an appointment on Date: 06/30/2022  for EP eval and gym Orientation.  Documentation of diagnosis can be found in [facility] Date: 05/31/2022 .

## 2022-06-17 NOTE — Telephone Encounter (Signed)
Pharmacy Transitions of Care Follow-up Telephone Call  Date of discharge: 05/31/22  Discharge Diagnosis: CAD with angina  How have you been since you were released from the hospital? Patient has been doing well since leaving the hospital. Does get some chest pain when she 'overdoes it' has also had some intermittent left foot 'vibrations' when walking. She has gotten [place] through [place] where [place] meds to her house.    Medication changes made at discharge: Start Brilinta 90  CONTINUE taking these medications  CONTINUE taking these medications  acetaminophen 325 MG tablet Commonly known as: TYLENOL  aspirin EC 81 MG tablet Take 1 tablet (81 mg total) by mouth daily.  atorvastatin 40 MG tablet Commonly known as: LIPITOR Take 1 tablet (40 mg total) by mouth daily at 6 PM.  CALCIUM 600 + D PO  Coenzyme Q10 50 MG Caps  * levothyroxine 75 MCG tablet Commonly known as: SYNTHROID  * levothyroxine 50 MCG tablet Commonly known as: SYNTHROID  loratadine 10 MG tablet Commonly known as: CLARITIN  metoprolol succinate 25 MG 24 hr tablet Commonly known as: TOPROL-XL Take 0.5 tablets (12.5 mg total) by mouth daily.  multivitamin capsule  nitroGLYCERIN 0.4 MG SL tablet Commonly known as: NITROSTAT Place 1 tablet (0.4 mg total) under the tongue every 5 (five) minutes as needed for chest pain.  Refresh 1.4-0.6 % Soln Generic drug: Polyvinyl Alcohol-Povidone PF  SYSTANE BALANCE OP  vitamin E 180 MG (400 UNITS) capsule   very important  * This list has 2 medication(s) that are the same as other medications prescribed for you. Read the directions carefully, and ask your doctor or other care provider to review them with you.   STOP taking these medications  STOP taking these medications  ticagrelor 60 MG Tabs tablet Commonly known as: BRILINTA    Medication changes verified by the patient? yes    Medication Accessibility:  Home Pharmacy: [facility]    Was the patient provided with refills on discharged medications? Yes, but already reordered   Have all prescriptions been transferred from [facility] to home pharmacy? Already reordered      Medication Review:  TICAGRELOR (BRILINTA) Ticagrelor 90 mg BID initiated on 05/31/22.  - Educated patient on expected duration of therapy of aspirin '81mg'$  with ticagrelor. Advised patient that dose of ticagrelor will be reduced after 3-6 months.  - Discussed importance of taking medication around the same time every day, - Reviewed potential DDIs with patient - Advised patient of medications to avoid (NSAIDs, aspirin maintenance doses>100 mg daily) - Educated that Tylenol (acetaminophen) will be the preferred analgesic to prevent risk of bleeding  - Emphasized importance of monitoring for signs and symptoms of bleeding (abnormal bruising, prolonged bleeding, nose bleeds, bleeding from gums, discolored urine, black tarry stools)  - Educated patient to notify doctor if shortness of breath or abnormal heartbeat occur - Advised patient to alert all providers of antiplatelet therapy prior to starting a new medication or having a procedure    Follow-up Appointments: Date Visit Type Length Department    06/30/2022 10:30 AM CARDIAC REHAB [place] 15 min [facility] [[phone]]    If their condition worsens, is the pt aware to call PCP or go to the Emergency Dept.? yes  Final Patient Assessment: Patient has been doing well since leaving the hospital. Does get some chest pain when she 'overdoes it' has also had some intermittent left foot 'vibrations' when walking. She has gotten patient assistance  [place] through [place] where they will mail meds to her house.    Darcus Austin, [place] 06/17/2022 4:28 PM

## 2022-06-22 ENCOUNTER — Encounter: Payer: Self-pay | Admitting: *Deleted

## 2022-06-30 VITALS — Ht 65.5 in | Wt 117.3 lb

## 2022-06-30 DIAGNOSIS — Z955 Presence of coronary angioplasty implant and graft: Secondary | ICD-10-CM | POA: Diagnosis not present

## 2022-06-30 DIAGNOSIS — Z48812 Encounter for surgical aftercare following surgery on the circulatory system: Secondary | ICD-10-CM | POA: Diagnosis not present

## 2022-06-30 NOTE — Patient Instructions (Signed)
Patient Instructions  Patient Details  Name: Shannon [name] MRN: [id number] Date of Birth: [birth year] Referring Provider:  Buford Dresser,*  Below are your personal goals for exercise, nutrition, and risk factors. Our goal is to help you stay on track towards obtaining and maintaining these goals. We will be discussing your progress on these goals with you throughout the program.  Initial Exercise Prescription:  Initial Exercise Prescription - 06/30/22 1500       Date of Initial Exercise RX and Referring Provider   Date 06/30/22    Referring Provider Buford Dresser MD      Oxygen   Maintain Oxygen Saturation 88% or higher      Treadmill   MPH 1.9    Grade 0.5    Minutes 15    METs 2.59      NuStep   Level 1    SPM 80    Minutes 15    METs 2.6      REL-XR   Level 1    Speed 50    Minutes 15    METs 2.6      T5 Nustep   Level 1    SPM 80    Minutes 15    METs 2.6      Prescription Details   Frequency (times per week) 3    Duration Progress to 30 minutes of continuous aerobic without signs/symptoms of physical distress      Intensity   THRR 40-80% of Max Heartrate 100 - 129    Ratings of Perceived Exertion 11-13    Perceived Dyspnea 0-4      Progression   Progression Continue to progress workloads to maintain intensity without signs/symptoms of physical distress.      Resistance Training   Training Prescription Yes    Weight 2 lb    Reps 10-15             Exercise Goals: Frequency: Be able to perform aerobic exercise two to three times per week in program working toward 2-5 days per week of home exercise.  Intensity: Work with a perceived exertion of 11 (fairly light) - 15 (hard) while following your exercise prescription.  We will make changes to your prescription with you as you progress through the program.   Duration: Be able to do 30 to 45 minutes of continuous aerobic exercise in addition to a 5 minute warm-up and a 5 minute  cool-down routine.   Nutrition Goals: Your personal nutrition goals will be established when you do your nutrition analysis with the dietician.  The following are general nutrition guidelines to follow: Cholesterol < '200mg'$ /day Sodium < '1500mg'$ /day Fiber: Women over 50 yrs - 21 grams per day  Personal Goals:  Personal Goals and Risk Factors at Admission - 06/30/22 1305       Core Components/Risk Factors/Patient Goals on Admission    Weight Management Yes;Weight Maintenance    Intervention Weight Management: Provide education and appropriate resources to help participant work on and attain dietary goals.;Weight Management: Develop a combined nutrition and exercise program designed to reach desired caloric intake, while maintaining appropriate intake of nutrient and fiber, sodium and fats, and appropriate energy expenditure required for the weight goal.    Admit Weight 117 lb (53.1 kg)    Goal Weight: Short Term 117 lb (53.1 kg)    Goal Weight: Long Term 117 lb (53.1 kg)    Expected Outcomes Short Term: Continue to assess and modify interventions until short term weight is  achieved;Long Term: Adherence to nutrition and physical activity/exercise program aimed toward attainment of established weight goal;Weight Maintenance: Understanding of the daily nutrition guidelines, which includes 25-35% calories from fat, 7% or less cal from saturated fats, less than '200mg'$  cholesterol, less than 1.5gm of sodium, & 5 or more servings of fruits and vegetables daily;Understanding recommendations for meals to include 15-35% energy as protein, 25-35% energy from fat, 35-60% energy from carbohydrates, less than '200mg'$  of dietary cholesterol, 20-35 gm of total fiber daily;Understanding of distribution of calorie intake throughout the day with the consumption of 4-5 meals/snacks    Hypertension Yes    Intervention Provide education on lifestyle modifcations including regular physical activity/exercise, weight  management, moderate sodium restriction and increased consumption of fresh fruit, vegetables, and low fat dairy, alcohol moderation, and smoking cessation.;Monitor prescription use compliance.    Expected Outcomes Short Term: Continued assessment and intervention until BP is < 140/80m HG in hypertensive participants. < 130/88mHG in hypertensive participants with diabetes, heart failure or chronic kidney disease.;Long Term: Maintenance of blood pressure at goal levels.    Lipids Yes    Intervention Provide education and support for participant on nutrition & aerobic/resistive exercise along with prescribed medications to achieve LDL '70mg'$ , HDL >'40mg'$ .    Expected Outcomes Short Term: Participant states understanding of desired cholesterol values and is compliant with medications prescribed. Participant is following exercise prescription and nutrition guidelines.;Long Term: Cholesterol controlled with medications as prescribed, with individualized exercise RX and with personalized nutrition plan. Value goals: LDL < '70mg'$ , HDL > 40 mg.             Tobacco Use Initial Evaluation: Social History   Tobacco Use  Smoking Status Never  Smokeless Tobacco Never    Exercise Goals and Review:  Exercise Goals     Row Name 06/30/22 1304             Exercise Goals   Increase Physical Activity Yes       Intervention Provide advice, education, support and counseling about physical activity/exercise needs.;Develop an individualized exercise prescription for aerobic and resistive training based on initial evaluation findings, risk stratification, comorbidities and participant's personal goals.       Expected Outcomes Short Term: Attend rehab on a regular basis to increase amount of physical activity.;Long Term: Add in home exercise to make exercise part of routine and to increase amount of physical activity.;Long Term: Exercising regularly at least 3-5 days a week.       Increase Strength and Stamina  Yes       Intervention Provide advice, education, support and counseling about physical activity/exercise needs.;Develop an individualized exercise prescription for aerobic and resistive training based on initial evaluation findings, risk stratification, comorbidities and participant's personal goals.       Expected Outcomes Short Term: Increase workloads from initial exercise prescription for resistance, speed, and METs.;Short Term: Perform resistance training exercises routinely during rehab and add in resistance training at home;Long Term: Improve cardiorespiratory fitness, muscular endurance and strength as measured by increased METs and functional capacity (6MWT)       Able to understand and use rate of perceived exertion (RPE) scale Yes       Intervention Provide education and explanation on how to use RPE scale       Expected Outcomes Short Term: Able to use RPE daily in rehab to express subjective intensity level;Long Term:  Able to use RPE to guide intensity level when exercising independently  Able to understand and use Dyspnea scale Yes       Intervention Provide education and explanation on how to use Dyspnea scale       Expected Outcomes Short Term: Able to use Dyspnea scale daily in rehab to express subjective sense of shortness of breath during exertion;Long Term: Able to use Dyspnea scale to guide intensity level when exercising independently       Knowledge and understanding of Target Heart Rate Range (THRR) Yes       Intervention Provide education and explanation of THRR including how the numbers were predicted and where they are located for reference       Expected Outcomes Short Term: Able to state/look up THRR;Long Term: Able to use THRR to govern intensity when exercising independently;Short Term: Able to use daily as guideline for intensity in rehab       Able to check pulse independently Yes       Intervention Provide education and demonstration on how to check pulse in  carotid and radial arteries.;Review the importance of being able to check your own pulse for safety during independent exercise       Expected Outcomes Short Term: Able to explain why pulse checking is important during independent exercise;Long Term: Able to check pulse independently and accurately       Understanding of Exercise Prescription Yes       Intervention Provide education, explanation, and written materials on patient's individual exercise prescription       Expected Outcomes Short Term: Able to explain program exercise prescription;Long Term: Able to explain home exercise prescription to exercise independently                Copy of goals given to participant.

## 2022-06-30 NOTE — Progress Notes (Signed)
Cardiac Individual Treatment Plan  Patient Details  Name: Shannon [name] MRN: [id number] Date of Birth: 12/13/1946 Referring Provider:   Flowsheet Row Cardiac Rehab from 06/30/2022 in [facility] Cardiac and Pulmonary Rehab  Referring Provider Buford Dresser MD       Initial Encounter Date:  Flowsheet Row Cardiac Rehab from 06/30/2022 in [facility] Cardiac and Pulmonary Rehab  Date 06/30/22       Visit Diagnosis: Status post coronary artery stent placement  Patient's Home Medications on Admission:  Current Outpatient Medications:    acetaminophen (TYLENOL) 325 MG tablet, Take 325 mg by mouth every 6 (six) hours as needed for headache. , Disp: , Rfl:    aspirin EC 81 MG EC tablet, Take 1 tablet (81 mg total) by mouth daily., Disp: 30 tablet, Rfl: 0   atorvastatin (LIPITOR) 40 MG tablet, Take 1 tablet (40 mg total) by mouth daily at 6 PM., Disp: 30 tablet, Rfl: 0   Calcium Carb-Cholecalciferol (CALCIUM 600 + D PO), Take 300-600 mg by mouth See admin instructions. Take 600 mg after breakfast, 600 mg after lunch, and 600 mg after dinner, Disp: , Rfl:    Coenzyme Q10 50 MG CAPS, Take 50 mg by mouth daily. , Disp: , Rfl:    levothyroxine (SYNTHROID) 75 MCG tablet, Take 75 mcg by mouth every Tuesday, Thursday, Saturday, and Sunday. , Disp: , Rfl:    levothyroxine (SYNTHROID, LEVOTHROID) 50 MCG tablet, Take 50 mcg by mouth every Monday, Wednesday, and Friday. , Disp: , Rfl:    loratadine (CLARITIN) 10 MG tablet, Take 10 mg by mouth daily., Disp: , Rfl:    metoprolol succinate (TOPROL-XL) 25 MG 24 hr tablet, Take 0.5 tablets (12.5 mg total) by mouth daily., Disp: 45 tablet, Rfl: 3   Multiple Vitamin (MULTIVITAMIN) capsule, Take 1 capsule by mouth daily. Senior, Disp: , Rfl:    nitroGLYCERIN (NITROSTAT) 0.4 MG SL tablet, Place 1 tablet (0.4 mg total) under the tongue every 5 (five) minutes as needed for chest pain., Disp: 25 tablet, Rfl: 3   Polyvinyl Alcohol-Povidone PF (REFRESH) 1.4-0.6 % SOLN, Apply  1 drop to eye as needed (dry eye). Both eyes, Disp: , Rfl:    Propylene Glycol (SYSTANE BALANCE OP), Apply 1 drop to eye as needed (dry eye). Both eyes., Disp: , Rfl:    ticagrelor (BRILINTA) 90 MG TABS tablet, Take 1 tablet (90 mg total) by mouth 2 (two) times daily., Disp: 180 tablet, Rfl: 3   vitamin E 400 UNIT capsule, Take 400 Units by mouth every other day. 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   Arthritis    Cancer ([place])    skin - squamous cell   Complication of anesthesia    " I had difficulty coming out of it"   Dyspnea    with exertion   Family history of adverse reaction to anesthesia    " My daughter has a problem coming ou to it"   GERD (gastroesophageal reflux disease)    Headache    migraines in the past   Hypertension    Hypothyroidism    Stroke ([place]) 11/2018   some weakness on right side    SVT (supraventricular tachycardia) ([facility])    Thyroid disease     Tobacco Use: Social History   Tobacco Use  Smoking Status Never  Smokeless Tobacco Never    Labs: Review Flowsheet  Latest Ref Rng & Units 12/09/2018 12/24/2018 01/04/2019 09/23/2020  Labs for ITP Cardiac and Pulmonary Rehab  Cholestrol 0 - 200 mg/dL 199  - - -  LDL (calc) 0 - 99 mg/dL 132  - - -  HDL-C >40 mg/dL 45  - - -  Trlycerides <150 mg/dL 110  - - -  Hemoglobin A1c 4.8 - 5.6 % 5.4  - - -  PH, Arterial 7.350 - 7.450 - - 7.415  -  PCO2 arterial 32.0 - 48.0 mmHg - - 33.8  -  Bicarbonate 20.0 - 28.0 mmol/L - - 21.7  -  TCO2 22 - 32 mmol/L - '24  23  23   '$ Acid-base deficit 0.0 - 2.0 mmol/L - - 3.0  -  O2 Saturation % - - 100.0  -     Exercise Target Goals: Exercise Program Goal: Individual exercise prescription set using results from initial 6 min walk test and THRR while considering  patient's activity barriers and safety.   Exercise Prescription Goal: Initial exercise prescription builds to 30-45  minutes a day of aerobic activity, 2-3 days per week.  Home exercise guidelines will be given to patient during program as part of exercise prescription that the participant will acknowledge.   Education: Aerobic Exercise: - Group verbal and visual presentation on the components of exercise prescription. Introduces F.I.T.T principle from ACSM for exercise prescriptions.  Reviews F.I.T.T. principles of aerobic exercise including progression. Written material given at graduation.   Education: Resistance Exercise: - Group verbal and visual presentation on the components of exercise prescription. Introduces F.I.T.T principle from ACSM for exercise prescriptions  Reviews F.I.T.T. principles of resistance exercise including progression. Written material given at graduation.    Education: Exercise & Equipment Safety: - Individual verbal instruction and demonstration of equipment use and safety with use of the equipment. Flowsheet Row Cardiac Rehab from 06/30/2022 in [facility] Cardiac and Pulmonary Rehab  Education need identified 06/30/22  Date 06/30/22  Educator [place]  Instruction Review Code 1- Verbalizes Understanding       Education: Exercise Physiology & General Exercise Guidelines: - Group verbal and written instruction with models to review the exercise physiology of the cardiovascular system and associated critical values. Provides general exercise guidelines with specific guidelines to those with heart or lung disease.    Education: Flexibility, Balance, Mind/Body Relaxation: - Group verbal and visual presentation with interactive activity on the components of exercise prescription. Introduces F.I.T.T principle from ACSM for exercise prescriptions. Reviews F.I.T.T. principles of flexibility and balance exercise training including progression. Also discusses the mind body connection.  Reviews various relaxation techniques to help reduce and manage stress (i.e. Deep breathing, progressive muscle  relaxation, and visualization). Balance handout provided to take home. Written material given at graduation.   Activity Barriers & Risk Stratification:  Activity Barriers & Cardiac Risk Stratification - 06/30/22 1243       Activity Barriers & Cardiac Risk Stratification   Activity Barriers Shortness of Breath;Deconditioning;Muscular Weakness;Other (comment)    Comments CVA in 2019 --> left sided weakness    Cardiac Risk Stratification High             6 Minute Walk:  6 Minute Walk     Row Name 06/30/22 1303         6 Minute Walk   Phase Initial     Distance 1140 feet     Walk Time 6 minutes     # of Rest Breaks 0     MPH 2.15  METS 2.63     RPE 10     Perceived Dyspnea  0     VO2 Peak 9.23     Symptoms Yes (comment)     Comments Fatigue     Resting HR 71 bpm     Resting BP 118/74     Resting Oxygen Saturation  99 %     Exercise Oxygen Saturation  during 6 min walk 95 %     Max Ex. HR 83 bpm     Max Ex. BP 144/76     2 Minute Post BP 124/74              Oxygen Initial Assessment:   Oxygen Re-Evaluation:   Oxygen Discharge (Final Oxygen Re-Evaluation):   Initial Exercise Prescription:  Initial Exercise Prescription - 06/30/22 1500       Date of Initial Exercise RX and Referring Provider   Date 06/30/22    Referring Provider Buford Dresser MD      Oxygen   Maintain Oxygen Saturation 88% or higher      Treadmill   MPH 1.9    Grade 0.5    Minutes 15    METs 2.59      NuStep   Level 1    SPM 80    Minutes 15    METs 2.6      REL-XR   Level 1    Speed 50    Minutes 15    METs 2.6      T5 Nustep   Level 1    SPM 80    Minutes 15    METs 2.6      Prescription Details   Frequency (times per week) 3    Duration Progress to 30 minutes of continuous aerobic without signs/symptoms of physical distress      Intensity   THRR 40-80% of Max Heartrate 100 - 129    Ratings of Perceived Exertion 11-13    Perceived Dyspnea 0-4       Progression   Progression Continue to progress workloads to maintain intensity without signs/symptoms of physical distress.      Resistance Training   Training Prescription Yes    Weight 2 lb    Reps 10-15             Perform Capillary Blood Glucose checks as needed.  Exercise Prescription Changes:   Exercise Prescription Changes     Row Name 06/30/22 1500             Response to Exercise   Blood Pressure (Admit) 118/74       Blood Pressure (Exercise) 144/76       Blood Pressure (Exit) 124/74       Heart Rate (Admit) 71 bpm       Heart Rate (Exercise) 83 bpm       Heart Rate (Exit) 70 bpm       Oxygen Saturation (Admit) 99 %       Oxygen Saturation (Exercise) 95 %       Oxygen Saturation (Exit) 96 %       Rating of Perceived Exertion (Exercise) 10       Perceived Dyspnea (Exercise) 0       Symptoms fatigued       Comments walk test results                Exercise Comments:   Exercise Goals and Review:   Exercise Goals  [place] Name 06/30/22 1304             Exercise Goals   Increase Physical Activity Yes       Intervention Provide advice, education, support and counseling about physical activity/exercise needs.;Develop an individualized exercise prescription for aerobic and resistive training based on initial evaluation findings, risk stratification, comorbidities and participant's personal goals.       Expected Outcomes Short Term: Attend rehab on a regular basis to increase amount of physical activity.;Long Term: Add in home exercise to make exercise part of routine and to increase amount of physical activity.;Long Term: Exercising regularly at least 3-5 days a week.       Increase Strength and Stamina Yes       Intervention Provide advice, education, support and counseling about physical activity/exercise needs.;Develop an individualized exercise prescription for aerobic and resistive training based on initial evaluation findings, risk  stratification, comorbidities and participant's personal goals.       Expected Outcomes Short Term: Increase workloads from initial exercise prescription for resistance, speed, and METs.;Short Term: Perform resistance training exercises routinely during rehab and add in resistance training at home;Long Term: Improve cardiorespiratory fitness, muscular endurance and strength as measured by increased METs and functional capacity (6MWT)       Able to understand and use rate of perceived exertion (RPE) scale Yes       Intervention Provide education and explanation on how to use RPE scale       Expected Outcomes Short Term: Able to use RPE daily in rehab to express subjective intensity level;Long Term:  Able to use RPE to guide intensity level when exercising independently       Able to understand and use Dyspnea scale Yes       Intervention Provide education and explanation on how to use Dyspnea scale       Expected Outcomes Short Term: Able to use Dyspnea scale daily in rehab to express subjective sense of shortness of breath during exertion;Long Term: Able to use Dyspnea scale to guide intensity level when exercising independently       Knowledge and understanding of Target Heart Rate Range (THRR) Yes       Intervention Provide education and explanation of THRR including how the numbers were predicted and where they are located for reference       Expected Outcomes Short Term: Able to state/look up THRR;Long Term: Able to use THRR to govern intensity when exercising independently;Short Term: Able to use daily as guideline for intensity in rehab       Able to check pulse independently Yes       Intervention Provide education and demonstration on how to check pulse in carotid and radial arteries.;Review the importance of being able to check your own pulse for safety during independent exercise       Expected Outcomes Short Term: Able to explain why pulse checking is important during independent  exercise;Long Term: Able to check pulse independently and accurately       Understanding of Exercise Prescription Yes       Intervention Provide education, explanation, and written materials on patient's individual exercise prescription       Expected Outcomes Short Term: Able to explain program exercise prescription;Long Term: Able to explain home exercise prescription to exercise independently                Exercise Goals Re-Evaluation :   Discharge Exercise Prescription (Final Exercise Prescription Changes):  Exercise Prescription Changes -  06/30/22 1500       Response to Exercise   Blood Pressure (Admit) 118/74    Blood Pressure (Exercise) 144/76    Blood Pressure (Exit) 124/74    Heart Rate (Admit) 71 bpm    Heart Rate (Exercise) 83 bpm    Heart Rate (Exit) 70 bpm    Oxygen Saturation (Admit) 99 %    Oxygen Saturation (Exercise) 95 %    Oxygen Saturation (Exit) 96 %    Rating of Perceived Exertion (Exercise) 10    Perceived Dyspnea (Exercise) 0    Symptoms fatigued    Comments walk test results             Nutrition:  Target Goals: Understanding of nutrition guidelines, daily intake of sodium '1500mg'$ , cholesterol '200mg'$ , calories 30% from fat and 7% or less from saturated fats, daily to have 5 or more servings of fruits and vegetables.  Education: All About Nutrition: -Group instruction provided by verbal, written material, interactive activities, discussions, models, and posters to present general guidelines for heart healthy nutrition including fat, fiber, MyPlate, the role of sodium in heart healthy nutrition, utilization of the nutrition label, and utilization of this knowledge for meal planning. Follow up email sent as well. Written material given at graduation. Flowsheet Row Cardiac Rehab from 06/30/2022 in [facility] Cardiac and Pulmonary Rehab  Education need identified 06/30/22       Biometrics:  Pre Biometrics - 06/30/22 1243       Pre Biometrics    Height 5' 5.5" (1.664 m)    Weight 117 lb 4.8 oz (53.2 kg)    BMI (Calculated) 19.22    Single Leg Stand 3.5 seconds              Nutrition Therapy Plan and Nutrition Goals:  Nutrition Therapy & Goals - 06/30/22 1245       Intervention Plan   Intervention Prescribe, educate and counsel regarding individualized specific dietary modifications aiming towards targeted core components such as weight, hypertension, lipid management, diabetes, heart failure and other comorbidities.    Expected Outcomes Short Term Goal: Understand basic principles of dietary content, such as calories, fat, sodium, cholesterol and nutrients.;Short Term Goal: A plan has been developed with personal nutrition goals set during dietitian appointment.;Long Term Goal: Adherence to prescribed nutrition plan.             Nutrition Assessments:  MEDIFICTS Score Key: ?70 Need to make dietary changes  40-70 Heart Healthy Diet ? 40 Therapeutic Level Cholesterol Diet  Flowsheet Row Cardiac Rehab from 06/30/2022 in [facility] Cardiac and Pulmonary Rehab  Picture Your Plate Total Score on Admission 82      Picture Your Plate Scores: <57 Unhealthy dietary pattern with much room for improvement. 41-50 Dietary pattern unlikely to meet recommendations for good health and room for improvement. 51-60 More healthful dietary pattern, with some room for improvement.  >60 Healthy dietary pattern, although there may be some specific behaviors that could be improved.    Nutrition Goals Re-Evaluation:   Nutrition Goals Discharge (Final Nutrition Goals Re-Evaluation):   Psychosocial: Target Goals: Acknowledge presence or absence of significant depression and/or stress, maximize coping skills, provide positive support system. Participant is able to verbalize types and ability to use techniques and skills needed for reducing stress and depression.   Education: Stress, Anxiety, and Depression - Group verbal and visual  presentation to define topics covered.  Reviews how body is impacted by stress, anxiety, and depression.  Also discusses healthy ways  to reduce stress and to treat/manage anxiety and depression.  Written material given at graduation.   Education: Sleep Hygiene -Provides group verbal and written instruction about how sleep can affect your health.  Define sleep hygiene, discuss sleep cycles and impact of sleep habits. Review good sleep hygiene tips.    Initial Review & Psychosocial Screening:  Initial Psych Review & Screening - 06/17/22 1005       Initial Review   Current issues with Current Stress Concerns    Source of Stress Concerns Unable to participate in former interests or hobbies    Comments past couple of years not able to do what she normally does.      Family Dynamics   Good Support System? Yes   husband,  2 daughters one in [place] one in [place]     Barriers   Psychosocial barriers to participate in program There are no identifiable barriers or psychosocial needs.      Screening Interventions   Interventions Encouraged to exercise;Provide feedback about the scores to participant;To provide support and resources with identified psychosocial needs    Expected Outcomes Short Term goal: Utilizing psychosocial counselor, staff and physician to assist with identification of specific Stressors or current issues interfering with healing process. Setting desired goal for each stressor or current issue identified.;Long Term Goal: Stressors or current issues are controlled or eliminated.;Short Term goal: Identification and review with participant of any Quality of Life or Depression concerns found by scoring the questionnaire.;Long Term goal: The participant improves quality of Life and PHQ9 Scores as seen by post scores and/or verbalization of changes             Quality of Life Scores:   Quality of Life - 06/30/22 1245       Quality of Life   Select Quality of Life      Quality  of Life Scores   Health/Function Pre 21.2 %    Socioeconomic Pre 22.56 %    Psych/Spiritual Pre 25.71 %    Family Pre 27.6 %    GLOBAL Pre 23.33 %            Scores of 19 and below usually indicate a poorer quality of life in these areas.  A difference of  2-3 points is a clinically meaningful difference.  A difference of 2-3 points in the total score of the Quality of Life Index has been associated with significant improvement in overall quality of life, self-image, physical symptoms, and general health in studies assessing change in quality of life.  PHQ-9: Review Flowsheet  More data may exist      06/30/2022 04/29/2020 01/10/2019 01/03/2019 12/29/2018  Depression screen PHQ 2/9  Decreased Interest 0 0 0 0 0  Down, Depressed, Hopeless 0 0 0 0 0  PHQ - 2 Score 0 0 0 0 0  Altered sleeping 2 - - - -  Tired, decreased energy 2 - - - -  Change in appetite 0 - - - -  Feeling bad or failure about yourself  0 - - - -  Trouble concentrating 0 - - - -  Moving slowly or fidgety/restless 2 - - - -  Suicidal thoughts 0 - - - -  PHQ-9 Score 6 - - - -  Difficult doing work/chores Somewhat difficult - - - -   Interpretation of Total Score  Total Score Depression Severity:  1-4 = Minimal depression, 5-9 = Mild depression, 10-14 = Moderate depression, 15-19 = Moderately severe  depression, 20-27 = Severe depression   Psychosocial Evaluation and Intervention:  Psychosocial Evaluation - 06/17/22 1019       Psychosocial Evaluation & Interventions   Interventions Encouraged to exercise with the program and follow exercise prescription    Comments [place] has no barriers to attending the program. [facility] is ready to get started and work on her exercise stamina. SHe ahd her [place] Michela [place].  I found a program close to thier summer place and she will contact the [facility] program to see if they can accept her there. Her support is her 2 daughters ;one in [place] one  in [place]; and her husband.    Expected Outcomes STG Autry attends all scheduled sessions, she will be able to progress with her exercise program. LTG Ermalinda continues with her progression after discharge    Continue Psychosocial Services  Follow up required by staff             Psychosocial Re-Evaluation:   Psychosocial Discharge (Final Psychosocial Re-Evaluation):   Vocational Rehabilitation: Provide vocational rehab assistance to qualifying candidates.   Vocational Rehab Evaluation & Intervention:  Vocational Rehab - 06/17/22 1037       Initial Vocational Rehab Evaluation & Intervention   Assessment shows need for Vocational Rehabilitation No      Vocational Rehab Re-Evaulation   Comments retired      Discharge Vocational Rehab   Discharge Vocational Rehabilitation retired             Education: Education Goals: Education classes will be provided on a variety of topics geared toward better understanding of heart health and risk factor modification. Participant will state understanding/return demonstration of topics presented as noted by education test scores.  Learning Barriers/Preferences:   General Cardiac Education Topics:  AED/CPR: - Group verbal and written instruction with the use of models to demonstrate the basic use of the AED with the basic ABC's of resuscitation.   Anatomy and Cardiac Procedures: - Group verbal and visual presentation and models provide information about basic cardiac anatomy and function. Reviews the testing methods done to diagnose heart disease and the outcomes of the test results. Describes the treatment choices: Medical Management, Angioplasty, or Coronary Bypass Surgery for treating various heart conditions including Myocardial Infarction, Angina, Valve Disease, and Cardiac Arrhythmias.  Written material given at graduation. Flowsheet Row Cardiac Rehab from 06/30/2022 in [facility] Cardiac and Pulmonary Rehab  Education need  identified 06/30/22       Medication Safety: - Group verbal and visual instruction to review commonly prescribed medications for heart and lung disease. Reviews the medication, class of the drug, and side effects. Includes the steps to properly store meds and maintain the prescription regimen.  Written material given at graduation.   Intimacy: - Group verbal instruction through game format to discuss how heart and lung disease can affect sexual intimacy. Written material given at graduation..   Know Your Numbers and Heart Failure: - Group verbal and visual instruction to discuss disease risk factors for cardiac and pulmonary disease and treatment options.  Reviews associated critical values for Overweight/Obesity, Hypertension, Cholesterol, and Diabetes.  Discusses basics of heart failure: signs/symptoms and treatments.  Introduces Heart Failure Zone chart for action plan for heart failure.  Written material given at graduation. Flowsheet Row Cardiac Rehab from 06/30/2022 in [facility] Cardiac and Pulmonary Rehab  Education need identified 06/30/22       Infection Prevention: - Provides verbal and written  material to individual with discussion of infection control including proper hand washing and proper equipment cleaning during exercise session. Flowsheet Row Cardiac Rehab from 06/30/2022 in [facility] Cardiac and Pulmonary Rehab  Education need identified 06/30/22  Date 06/30/22  Educator [place]  Instruction Review Code 1- Verbalizes Understanding       Falls Prevention: - Provides verbal and written material to individual with discussion of falls prevention and safety. Flowsheet Row Cardiac Rehab from 06/17/2022 in [facility] Cardiac and Pulmonary Rehab  Date 06/17/22  Educator [place]  Instruction Review Code 1- Verbalizes Understanding       Other: -Provides group and verbal instruction on various topics (see comments)   Knowledge Questionnaire Score:  Knowledge Questionnaire Score - 06/30/22  1245       Knowledge Questionnaire Score   Pre Score 23/26             Core Components/Risk Factors/Patient Goals at Admission:  Personal Goals and Risk Factors at Admission - 06/30/22 1305       Core Components/Risk Factors/Patient Goals on Admission    Weight Management Yes;Weight Maintenance    Intervention Weight Management: Provide education and appropriate resources to help participant work on and attain dietary goals.;Weight Management: Develop a combined nutrition and exercise program designed to reach desired caloric intake, while maintaining appropriate intake of nutrient and fiber, sodium and fats, and appropriate energy expenditure required for the weight goal.    Admit Weight 117 lb (53.1 kg)    Goal Weight: Short Term 117 lb (53.1 kg)    Goal Weight: Long Term 117 lb (53.1 kg)    Expected Outcomes Short Term: Continue to assess and modify interventions until short term weight is achieved;Long Term: Adherence to nutrition and physical activity/exercise program aimed toward attainment of established weight goal;Weight Maintenance: Understanding of the daily nutrition guidelines, which includes 25-35% calories from fat, 7% or less cal from saturated fats, less than '200mg'$  cholesterol, less than 1.5gm of sodium, & 5 or more servings of fruits and vegetables daily;Understanding recommendations for meals to include 15-35% energy as protein, 25-35% energy from fat, 35-60% energy from carbohydrates, less than '200mg'$  of dietary cholesterol, 20-35 gm of total fiber daily;Understanding of distribution of calorie intake throughout the day with the consumption of 4-5 meals/snacks    Hypertension Yes    Intervention Provide education on lifestyle modifcations including regular physical activity/exercise, weight management, moderate sodium restriction and increased consumption of fresh fruit, vegetables, and low fat dairy, alcohol moderation, and smoking cessation.;Monitor prescription use  compliance.    Expected Outcomes Short Term: Continued assessment and intervention until BP is < 140/63m HG in hypertensive participants. < 130/838mHG in hypertensive participants with diabetes, heart failure or chronic kidney disease.;Long Term: Maintenance of blood pressure at goal levels.    Lipids Yes    Intervention Provide education and support for participant on nutrition & aerobic/resistive exercise along with prescribed medications to achieve LDL '70mg'$ , HDL >'40mg'$ .    Expected Outcomes Short Term: Participant states understanding of desired cholesterol values and is compliant with medications prescribed. Participant is following exercise prescription and nutrition guidelines.;Long Term: Cholesterol controlled with medications as prescribed, with individualized exercise RX and with personalized nutrition plan. Value goals: LDL < '70mg'$ , HDL > 40 mg.             Education:Diabetes - Individual verbal and written instruction to review signs/symptoms of diabetes, desired ranges of glucose level fasting, after meals and with exercise. Acknowledge that pre and post exercise glucose  checks will be done for 3 sessions at entry of program.   Core Components/Risk Factors/Patient Goals Review:    Core Components/Risk Factors/Patient Goals at Discharge (Final Review):    ITP Comments:  ITP Comments     Row Name 06/17/22 1028 06/30/22 1233 06/30/22 1235       ITP Comments Virtual orientation call completed today. shehas an appointment on Date: 06/30/2022  for EP eval and gym Orientation.  Documentation of diagnosis can be found in [facility] Date: 05/31/2022 . Completed 6MWT and gym orientation. Initial ITP created and sent for review to Dr. Emily Filbert, Medical Director. EKG  strips sent to Dr. Harrell Gave              Comments: Initial ITP

## 2022-07-02 ENCOUNTER — Encounter: Payer: Medicare Other | Admitting: *Deleted

## 2022-07-02 DIAGNOSIS — Z955 Presence of coronary angioplasty implant and graft: Secondary | ICD-10-CM

## 2022-07-02 DIAGNOSIS — Z48812 Encounter for surgical aftercare following surgery on the circulatory system: Secondary | ICD-10-CM | POA: Diagnosis not present

## 2022-07-02 NOTE — Progress Notes (Signed)
Daily Session Note  Patient Details  Name: Shannon [name] MRN: [id number] Date of Birth: 02-Sep-1946 Referring Provider:   Flowsheet Row Cardiac Rehab from 06/30/2022 in [facility] Cardiac and Pulmonary Rehab  Referring Provider Buford Dresser MD       Encounter Date: 07/02/2022  Check In:  Session Check In - 07/02/22 0955       Check-In   Supervising physician immediately available to respond to emergencies See telemetry face sheet for immediately available ER MD    Location [facility]-Cardiac & Pulmonary Rehab    Staff Present Heath Lark, RN, BSN, CCRP;Melissa Pilger, RDN, LDN;Jessica [place], MA, RCEP, CCRP, CCET;Meredith Sherryll Burger, RN BSN    Virtual Visit No    Medication changes reported     No    Fall or balance concerns reported    No    Warm-up and Cool-down Performed on first and last piece of equipment    Resistance Training Performed Yes    VAD Patient? No    PAD/SET Patient? No      Pain Assessment   Currently in Pain? No/denies                Social History   Tobacco Use  Smoking Status Never  Smokeless Tobacco Never    Goals Met:  Exercise tolerated well Personal goals reviewed No report of concerns or symptoms today  Goals Unmet:  Not Applicable  Comments: First full day of exercise!  Patient was oriented to gym and equipment including functions, settings, policies, and procedures.  Patient's individual exercise prescription and treatment plan were reviewed.  All starting workloads were established based on the results of the 6 minute walk test done at initial orientation visit.  The plan for exercise progression was also introduced and progression will be customized based on patient's performance and goals.    Dr. Emily Filbert is Medical Director for [place].  Dr. Ottie Glazier is Medical Director for [facility] Pulmonary Rehabilitation.

## 2022-07-05 ENCOUNTER — Encounter: Payer: Medicare Other | Admitting: *Deleted

## 2022-07-05 DIAGNOSIS — Z48812 Encounter for surgical aftercare following surgery on the circulatory system: Secondary | ICD-10-CM | POA: Diagnosis not present

## 2022-07-05 DIAGNOSIS — Z955 Presence of coronary angioplasty implant and graft: Secondary | ICD-10-CM

## 2022-07-05 NOTE — Progress Notes (Signed)
Daily Session Note  Patient Details  Name: Shannon [name] MRN: [id number] Date of Birth: Apr 14, 1946 Referring Provider:   Flowsheet Row Cardiac Rehab from 06/30/2022 in [facility] Cardiac and Pulmonary Rehab  Referring Provider Buford Dresser MD       Encounter Date: 07/05/2022  Check In:  Session Check In - 07/05/22 0931       Check-In   Supervising physician immediately available to respond to emergencies See telemetry face sheet for immediately available ER MD    Location [facility]-Cardiac & Pulmonary Rehab    Staff Present Heath Lark, RN, BSN, CCRP;Jessica [place], MA, RCEP, CCRP, [place], BS, ACSM CEP, Exercise Physiologist    Virtual Visit No    Medication changes reported     No    Fall or balance concerns reported    No    Warm-up and Cool-down Performed on first and last piece of equipment    Resistance Training Performed Yes    VAD Patient? No    PAD/SET Patient? No      Pain Assessment   Currently in Pain? No/denies                Social History   Tobacco Use  Smoking Status Never  Smokeless Tobacco Never    Goals Met:  Independence with exercise equipment Exercise tolerated well No report of concerns or symptoms today  Goals Unmet:  Not Applicable  Comments: Pt able to follow exercise prescription today without complaint.  Will continue to monitor for progression.    Dr. Emily Filbert is Medical Director for [place].  Dr. Ottie Glazier is Medical Director for [facility] Pulmonary Rehabilitation.

## 2022-07-07 ENCOUNTER — Encounter: Payer: Medicare Other | Admitting: *Deleted

## 2022-07-07 DIAGNOSIS — Z48812 Encounter for surgical aftercare following surgery on the circulatory system: Secondary | ICD-10-CM | POA: Diagnosis not present

## 2022-07-07 DIAGNOSIS — Z955 Presence of coronary angioplasty implant and graft: Secondary | ICD-10-CM

## 2022-07-07 NOTE — Progress Notes (Signed)
Daily Session Note  Patient Details  Name: Shannon [name] MRN: [id number] Date of Birth: 04-10-1946 Referring Provider:   Flowsheet Row Cardiac Rehab from 06/30/2022 in [facility] Cardiac and Pulmonary Rehab  Referring Provider Buford Dresser MD       Encounter Date: 07/07/2022  Check In:  Session Check In - 07/07/22 1037       Check-In   Supervising physician immediately available to respond to emergencies See telemetry face sheet for immediately available ER MD    Location [facility]-Cardiac & Pulmonary Rehab    Staff Present Heath Lark, RN, BSN, CCRP;Jessica [place], MA, RCEP, CCRP, [place], BS, ACSM CEP, Exercise Physiologist    Virtual Visit No    Medication changes reported     No    Fall or balance concerns reported    No    Warm-up and Cool-down Performed on first and last piece of equipment    Resistance Training Performed Yes    VAD Patient? No    PAD/SET Patient? No      Pain Assessment   Currently in Pain? No/denies                Social History   Tobacco Use  Smoking Status Never  Smokeless Tobacco Never    Goals Met:  Independence with exercise equipment Exercise tolerated well No report of concerns or symptoms today  Goals Unmet:  Not Applicable  Comments: Pt able to follow exercise prescription today without complaint.  Will continue to monitor for progression.    Dr. Emily Filbert is Medical Director for [place].  Dr. Ottie Glazier is Medical Director for [facility] Pulmonary Rehabilitation.

## 2022-07-09 ENCOUNTER — Encounter: Payer: Medicare Other | Admitting: *Deleted

## 2022-07-09 DIAGNOSIS — Z955 Presence of coronary angioplasty implant and graft: Secondary | ICD-10-CM

## 2022-07-09 DIAGNOSIS — Z48812 Encounter for surgical aftercare following surgery on the circulatory system: Secondary | ICD-10-CM | POA: Diagnosis not present

## 2022-07-09 NOTE — Progress Notes (Signed)
Daily Session Note  Patient Details  Name: Shannon [name] MRN: [id number] Date of Birth: [birth year] Referring Provider:   Flowsheet Row Cardiac Rehab from 06/30/2022 in [facility] Cardiac and Pulmonary Rehab  Referring Provider Buford Dresser MD       Encounter Date: 07/09/2022  Check In:  Session Check In - 07/09/22 0932       Check-In   Supervising physician immediately available to respond to emergencies See telemetry face sheet for immediately available ER MD    Location [facility]-Cardiac & Pulmonary Rehab    Staff Present Carson Myrtle, BS, RRT, CPFT;Grier Czerwinski Sherryll Burger, RN BSN;Jessica Luan [place], MA, RCEP, CCRP, CCET    Virtual Visit No    Medication changes reported     No    Fall or balance concerns reported    No    Warm-up and Cool-down Performed on first and last piece of equipment    Resistance Training Performed Yes    VAD Patient? No    PAD/SET Patient? No      Pain Assessment   Currently in Pain? No/denies                Social History   Tobacco Use  Smoking Status Never  Smokeless Tobacco Never    Goals Met:  Independence with exercise equipment Exercise tolerated well No report of concerns or symptoms today Strength training completed today  Goals Unmet:  Not Applicable  Comments: Pt able to follow exercise prescription today without complaint.  Will continue to monitor for progression.    Dr. Emily Filbert is Medical Director for [place].  Dr. Ottie Glazier is Medical Director for [facility] Pulmonary Rehabilitation.

## 2022-07-12 ENCOUNTER — Encounter: Payer: Medicare Other | Admitting: *Deleted

## 2022-07-12 DIAGNOSIS — Z955 Presence of coronary angioplasty implant and graft: Secondary | ICD-10-CM

## 2022-07-12 DIAGNOSIS — Z48812 Encounter for surgical aftercare following surgery on the circulatory system: Secondary | ICD-10-CM | POA: Diagnosis not present

## 2022-07-12 NOTE — Progress Notes (Signed)
Daily Session Note  Patient Details  Name: Shannon [name] MRN: [id number] Date of Birth: 22-Mar-1946 Referring Provider:   Flowsheet Row Cardiac Rehab from 06/30/2022 in [facility] Cardiac and Pulmonary Rehab  Referring Provider Buford Dresser MD       Encounter Date: 07/12/2022  Check In:  Session Check In - 07/12/22 1033       Check-In   Supervising physician immediately available to respond to emergencies See telemetry face sheet for immediately available ER MD    Location [facility]-Cardiac & Pulmonary Rehab    Staff Present Heath Lark, RN, BSN, Laveda Norman, BS, ACSM CEP, Exercise Physiologist;Noah Tickle, BS, Exercise Physiologist    Virtual Visit No    Medication changes reported     No    Fall or balance concerns reported    No    Warm-up and Cool-down Performed on first and last piece of equipment    Resistance Training Performed Yes    VAD Patient? No    PAD/SET Patient? No      Pain Assessment   Currently in Pain? No/denies                Social History   Tobacco Use  Smoking Status Never  Smokeless Tobacco Never    Goals Met:  Independence with exercise equipment Exercise tolerated well No report of concerns or symptoms today  Goals Unmet:  Not Applicable  Comments: Pt able to follow exercise prescription today without complaint.  Will continue to monitor for progression.    Dr. Emily Filbert is Medical Director for [place].  Dr. Ottie Glazier is Medical Director for [facility] Pulmonary Rehabilitation.

## 2022-07-14 ENCOUNTER — Encounter: Payer: Medicare Other | Attending: Cardiology

## 2022-07-14 DIAGNOSIS — Z955 Presence of coronary angioplasty implant and graft: Secondary | ICD-10-CM | POA: Insufficient documentation

## 2022-07-14 DIAGNOSIS — Z48812 Encounter for surgical aftercare following surgery on the circulatory system: Secondary | ICD-10-CM | POA: Insufficient documentation

## 2022-07-14 NOTE — Progress Notes (Signed)
Daily Session Note  Patient Details  Name: Shannon [name] MRN: [id number] Date of Birth: 01/06/1946 Referring Provider:   Flowsheet Row Cardiac Rehab from 06/30/2022 in [facility] Cardiac and Pulmonary Rehab  Referring Provider Buford Dresser MD       Encounter Date: 07/14/2022  Check In:  Session Check In - 07/14/22 0923       Check-In   Supervising physician immediately available to respond to emergencies See telemetry face sheet for immediately available ER MD    Location [facility]-Cardiac & Pulmonary Rehab    Staff Present Carson Myrtle, BS, RRT, CPFT;Pailynn Vahey, BS, Exercise Physiologist;Susanne Bice, RN, BSN, CCRP;Melissa [place], RDN, Tawanna Solo, MS, ASCM CEP, Exercise Physiologist    Virtual Visit No    Medication changes reported     No    Fall or balance concerns reported    No    Tobacco Cessation No Change    Warm-up and Cool-down Performed on first and last piece of equipment    Resistance Training Performed Yes    VAD Patient? No    PAD/SET Patient? No      Pain Assessment   Currently in Pain? No/denies    Multiple Pain Sites No                Social History   Tobacco Use  Smoking Status Never  Smokeless Tobacco Never    Goals Met:  Independence with exercise equipment Exercise tolerated well No report of concerns or symptoms today  Goals Unmet:  Not Applicable  Comments: Pt able to follow exercise prescription today without complaint.  Will continue to monitor for progression.    Dr. Emily Filbert is Medical Director for [place].  Dr. Ottie Glazier is Medical Director for [facility] Pulmonary Rehabilitation.

## 2022-07-16 ENCOUNTER — Encounter: Payer: Medicare Other | Admitting: *Deleted

## 2022-07-16 DIAGNOSIS — Z955 Presence of coronary angioplasty implant and graft: Secondary | ICD-10-CM

## 2022-07-16 NOTE — Progress Notes (Signed)
Daily Session Note  Patient Details  Name: Shannon [name] MRN: [id number] Date of Birth: 11/05/1946 Referring Provider:   Flowsheet Row Cardiac Rehab from 06/30/2022 in [facility] Cardiac and Pulmonary Rehab  Referring Provider Christopher, Bridgette MD       Encounter Date: 07/16/2022  Check In:  Session Check In - 07/16/22 0932       Check-In   Supervising physician immediately available to respond to emergencies See telemetry face sheet for immediately available ER MD    Location [facility]-Cardiac & Pulmonary Rehab    Staff Present Susanne Bice, RN, BSN, CCRP;Kristen Coble, RN,BC,MSN;Meredith Craven, RN BSN    Virtual Visit No    Medication changes reported     No    Fall or balance concerns reported    No    Warm-up and Cool-down Performed on first and last piece of equipment    Resistance Training Performed Yes    VAD Patient? No    PAD/SET Patient? No      Pain Assessment   Currently in Pain? No/denies                Social History   Tobacco Use  Smoking Status Never  Smokeless Tobacco Never    Goals Met:  Independence with exercise equipment Exercise tolerated well No report of concerns or symptoms today Strength training completed today  Goals Unmet:  Not Applicable  Comments: Pt able to follow exercise prescription today without complaint.  Will continue to monitor for progression.    Dr. Mark Miller is Medical Director for [facility] Cardiac [facility].  Dr. Fuad Aleskerov is Medical Director for [facility] Pulmonary Rehabilitation. 

## 2022-07-19 ENCOUNTER — Encounter: Payer: Medicare Other | Admitting: *Deleted

## 2022-07-19 DIAGNOSIS — Z955 Presence of coronary angioplasty implant and graft: Secondary | ICD-10-CM | POA: Diagnosis not present

## 2022-07-19 NOTE — Progress Notes (Signed)
Daily Session Note  Patient Details  Name: Shannon [name] MRN: [id number] Date of Birth: 03/02/1946 Referring Provider:   Flowsheet Row Cardiac Rehab from 06/30/2022 in [facility] Cardiac and Pulmonary Rehab  Referring Provider Buford Dresser MD       Encounter Date: 07/19/2022  Check In:  Session Check In - 07/19/22 1030       Check-In   Supervising physician immediately available to respond to emergencies See telemetry face sheet for immediately available ER MD    Location [facility]-Cardiac & Pulmonary Rehab    Staff Present Heath Lark, RN, BSN, Laveda Norman, BS, ACSM CEP, Exercise Physiologist;Kara Eliezer Bottom, MS, ASCM CEP, Exercise Physiologist    Virtual Visit No    Medication changes reported     No    Fall or balance concerns reported    No    Warm-up and Cool-down Performed on first and last piece of equipment    Resistance Training Performed Yes    VAD Patient? No    PAD/SET Patient? No      Pain Assessment   Currently in Pain? No/denies                Social History   Tobacco Use  Smoking Status Never  Smokeless Tobacco Never    Goals Met:  Independence with exercise equipment Exercise tolerated well No report of concerns or symptoms today  Goals Unmet:  Not Applicable  Comments: Pt able to follow exercise prescription today without complaint.  Will continue to monitor for progression.    Dr. Emily Filbert is Medical Director for [place].  Dr. Ottie Glazier is Medical Director for [facility] Pulmonary Rehabilitation.

## 2022-07-20 DIAGNOSIS — Z955 Presence of coronary angioplasty implant and graft: Secondary | ICD-10-CM

## 2022-07-20 NOTE — Progress Notes (Signed)
Completed initial RD consultation ?

## 2022-07-21 ENCOUNTER — Encounter: Payer: Medicare Other | Admitting: *Deleted

## 2022-07-21 ENCOUNTER — Encounter: Payer: Self-pay | Admitting: *Deleted

## 2022-07-21 DIAGNOSIS — Z955 Presence of coronary angioplasty implant and graft: Secondary | ICD-10-CM

## 2022-07-21 NOTE — Progress Notes (Signed)
Cardiac Individual Treatment Plan  Patient Details  Name: Shannon [name] MRN: [id number] Date of Birth: Jul 20, 1946 Referring Provider:   Flowsheet Row Cardiac Rehab from 06/30/2022 in [facility] Cardiac and Pulmonary Rehab  Referring Provider Buford Dresser MD       Initial Encounter Date:  Flowsheet Row Cardiac Rehab from 06/30/2022 in [facility] Cardiac and Pulmonary Rehab  Date 06/30/22       Visit Diagnosis: Status post coronary artery stent placement  Patient's Home Medications on Admission:  Current Outpatient Medications:    acetaminophen (TYLENOL) 325 MG tablet, Take 325 mg by mouth every 6 (six) hours as needed for headache. , Disp: , Rfl:    aspirin EC 81 MG EC tablet, Take 1 tablet (81 mg total) by mouth daily., Disp: 30 tablet, Rfl: 0   atorvastatin (LIPITOR) 40 MG tablet, Take 1 tablet (40 mg total) by mouth daily at 6 PM., Disp: 30 tablet, Rfl: 0   Calcium Carb-Cholecalciferol (CALCIUM 600 + D PO), Take 300-600 mg by mouth See admin instructions. Take 600 mg after breakfast, 600 mg after lunch, and 600 mg after dinner, Disp: , Rfl:    Coenzyme Q10 50 MG CAPS, Take 50 mg by mouth daily. , Disp: , Rfl:    levothyroxine (SYNTHROID) 75 MCG tablet, Take 75 mcg by mouth every Tuesday, Thursday, Saturday, and Sunday. , Disp: , Rfl:    levothyroxine (SYNTHROID, LEVOTHROID) 50 MCG tablet, Take 50 mcg by mouth every Monday, Wednesday, and Friday. , Disp: , Rfl:    loratadine (CLARITIN) 10 MG tablet, Take 10 mg by mouth daily., Disp: , Rfl:    metoprolol succinate (TOPROL-XL) 25 MG 24 hr tablet, Take 0.5 tablets (12.5 mg total) by mouth daily., Disp: 45 tablet, Rfl: 3   Multiple Vitamin (MULTIVITAMIN) capsule, Take 1 capsule by mouth daily. Senior, Disp: , Rfl:    nitroGLYCERIN (NITROSTAT) 0.4 MG SL tablet, Place 1 tablet (0.4 mg total) under the tongue every 5 (five) minutes as needed for chest pain., Disp: 25 tablet, Rfl: 3   Polyvinyl Alcohol-Povidone PF (REFRESH) 1.4-0.6 % SOLN, Apply  1 drop to eye as needed (dry eye). Both eyes, Disp: , Rfl:    Propylene Glycol (SYSTANE BALANCE OP), Apply 1 drop to eye as needed (dry eye). Both eyes., Disp: , Rfl:    ticagrelor (BRILINTA) 90 MG TABS tablet, Take 1 tablet (90 mg total) by mouth 2 (two) times daily., Disp: 180 tablet, Rfl: 3   vitamin E 400 UNIT capsule, Take 400 Units by mouth every other day. 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   Arthritis    Cancer ([place])    skin - squamous cell   Complication of anesthesia    " I had difficulty coming out of it"   Dyspnea    with exertion   Family history of adverse reaction to anesthesia    " My daughter has a problem coming ou to it"   GERD (gastroesophageal reflux disease)    Headache    migraines in the past   Hypertension    Hypothyroidism    Stroke ([place]) 11/2018   some weakness on right side    SVT (supraventricular tachycardia) ([facility])    Thyroid disease     Tobacco Use: Social History   Tobacco Use  Smoking Status Never  Smokeless Tobacco Never    Labs: Review Flowsheet  Latest Ref Rng & Units 12/09/2018 12/24/2018 01/04/2019 09/23/2020  Labs for ITP Cardiac and Pulmonary Rehab  Cholestrol 0 - 200 mg/dL 199  - - -  LDL (calc) 0 - 99 mg/dL 132  - - -  HDL-C >40 mg/dL 45  - - -  Trlycerides <150 mg/dL 110  - - -  Hemoglobin A1c 4.8 - 5.6 % 5.4  - - -  PH, Arterial 7.350 - 7.450 - - 7.415  -  PCO2 arterial 32.0 - 48.0 mmHg - - 33.8  -  Bicarbonate 20.0 - 28.0 mmol/L - - 21.7  -  TCO2 22 - 32 mmol/L - _0 Acid-base deficit 0.0 - 2.0 mmol/L - - 3.0  -  O2 Saturation % - - 100.0  -     Exercise Target Goals: Exercise Program Goal: Individual exercise prescription set using results from initial 6 min walk test and THRR while considering  patient's activity barriers and safety.   Exercise Prescription Goal: Initial exercise prescription builds to 30-45  minutes a day of aerobic activity, 2-3 days per week.  Home exercise guidelines will be given to patient during program as part of exercise prescription that the participant will acknowledge.   Education: Aerobic Exercise: - Group verbal and visual presentation on the components of exercise prescription. Introduces F.I.T.T principle from ACSM for exercise prescriptions.  Reviews F.I.T.T. principles of aerobic exercise including progression. Written material given at graduation.   Education: Resistance Exercise: - Group verbal and visual presentation on the components of exercise prescription. Introduces F.I.T.T principle from ACSM for exercise prescriptions  Reviews F.I.T.T. principles of resistance exercise including progression. Written material given at graduation.    Education: Exercise & Equipment Safety: - Individual verbal instruction and demonstration of equipment use and safety with use of the equipment. Flowsheet Row Cardiac Rehab from 07/14/2022 in [facility] Cardiac and Pulmonary Rehab  Education need identified 06/30/22  Date 06/30/22  Educator [place]  Instruction Review Code 1- Verbalizes Understanding       Education: Exercise Physiology & General Exercise Guidelines: - Group verbal and written instruction with models to review the exercise physiology of the cardiovascular system and associated critical values. Provides general exercise guidelines with specific guidelines to those with heart or lung disease.    Education: Flexibility, Balance, Mind/Body Relaxation: - Group verbal and visual presentation with interactive activity on the components of exercise prescription. Introduces F.I.T.T principle from ACSM for exercise prescriptions. Reviews F.I.T.T. principles of flexibility and balance exercise training including progression. Also discusses the mind body connection.  Reviews various relaxation techniques to help reduce and manage stress (i.e. Deep breathing, progressive muscle  relaxation, and visualization). Balance handout provided to take home. Written material given at graduation.   Activity Barriers & Risk Stratification:  Activity Barriers & Cardiac Risk Stratification - 06/30/22 1243       Activity Barriers & Cardiac Risk Stratification   Activity Barriers Shortness of Breath;Deconditioning;Muscular Weakness;Other (comment)    Comments CVA in 2019 --> left sided weakness    Cardiac Risk Stratification High             6 Minute Walk:  6 Minute Walk     Row Name 06/30/22 1303         6 Minute Walk   Phase Initial     Distance 1140 feet     Walk Time 6 minutes     # of Rest Breaks 0     MPH 2.15  METS 2.63     RPE 10     Perceived Dyspnea  0     VO2 Peak 9.23     Symptoms Yes (comment)     Comments Fatigue     Resting HR 71 bpm     Resting BP 118/74     Resting Oxygen Saturation  99 %     Exercise Oxygen Saturation  during 6 min walk 95 %     Max Ex. HR 83 bpm     Max Ex. BP 144/76     2 Minute Post BP 124/74              Oxygen Initial Assessment:   Oxygen Re-Evaluation:   Oxygen Discharge (Final Oxygen Re-Evaluation):   Initial Exercise Prescription:  Initial Exercise Prescription - 06/30/22 1500       Date of Initial Exercise RX and Referring Provider   Date 06/30/22    Referring Provider Buford Dresser MD      Oxygen   Maintain Oxygen Saturation 88% or higher      Treadmill   MPH 1.9    Grade 0.5    Minutes 15    METs 2.59      NuStep   Level 1    SPM 80    Minutes 15    METs 2.6      REL-XR   Level 1    Speed 50    Minutes 15    METs 2.6      T5 Nustep   Level 1    SPM 80    Minutes 15    METs 2.6      Prescription Details   Frequency (times per week) 3    Duration Progress to 30 minutes of continuous aerobic without signs/symptoms of physical distress      Intensity   THRR 40-80% of Max Heartrate 100 - 129    Ratings of Perceived Exertion 11-13    Perceived Dyspnea 0-4       Progression   Progression Continue to progress workloads to maintain intensity without signs/symptoms of physical distress.      Resistance Training   Training Prescription Yes    Weight 2 lb    Reps 10-15             Perform Capillary Blood Glucose checks as needed.  Exercise Prescription Changes:   Exercise Prescription Changes     Row Name 06/30/22 1500 07/15/22 1400 07/21/22 0900         Response to Exercise   Blood Pressure (Admit) 118/74 126/68 --     Blood Pressure (Exercise) 144/76 158/72 --     Blood Pressure (Exit) 124/74 118/68 --     Heart Rate (Admit) 71 bpm 87 bpm --     Heart Rate (Exercise) 83 bpm 101 bpm --     Heart Rate (Exit) 70 bpm 81 bpm --     Oxygen Saturation (Admit) 99 % -- --     Oxygen Saturation (Exercise) 95 % -- --     Oxygen Saturation (Exit) 96 % -- --     Rating of Perceived Exertion (Exercise) 10 13 --     Perceived Dyspnea (Exercise) 0 -- --     Symptoms fatigued none --     Comments walk test results 1st full day of exercise --     Duration -- Progress to 30 minutes of  aerobic without signs/symptoms of physical distress --  Intensity -- THRR unchanged --       Progression   Progression -- Continue to progress workloads to maintain intensity without signs/symptoms of physical distress. --     Average METs -- 2.3 --       Resistance Training   Training Prescription -- Yes --     Weight -- 2 lb --     Reps -- 10-15 --       Treadmill   MPH -- 1.9 --     Grade -- 0.5 --     Minutes -- 15 --     METs -- 2.59 --       NuStep   Level -- 4 --     Minutes -- 15 --     METs -- 2.1 --       REL-XR   Level -- 2 --     Minutes -- 15 --     METs -- 3.8 --       Track   Laps -- 29 --     Minutes -- 15 --     METs -- 2.58 --       Home Exercise Plan   Plans to continue exercise at -- -- Home (comment)  Walking at Home (Treadmill)     Frequency -- -- Add 2 additional days to program exercise sessions.     Initial  Home Exercises Provided -- -- 07/21/22       Oxygen   Maintain Oxygen Saturation -- 88% or higher 88% or higher              Exercise Comments:   Exercise Comments     Row Name 07/02/22 0956           Exercise Comments First full day of exercise!  Patient was oriented to gym and equipment including functions, settings, policies, and procedures.  Patient's individual exercise prescription and treatment plan were reviewed.  All starting workloads were established based on the results of the 6 minute walk test done at initial orientation visit.  The plan for exercise progression was also introduced and progression will be customized based on patient's performance and goals.                Exercise Goals and Review:   Exercise Goals     Row Name 06/30/22 1304             Exercise Goals   Increase Physical Activity Yes       Intervention Provide advice, education, support and counseling about physical activity/exercise needs.;Develop an individualized exercise prescription for aerobic and resistive training based on initial evaluation findings, risk stratification, comorbidities and participant's personal goals.       Expected Outcomes Short Term: Attend rehab on a regular basis to increase amount of physical activity.;Long Term: Add in home exercise to make exercise part of routine and to increase amount of physical activity.;Long Term: Exercising regularly at least 3-5 days a week.       Increase Strength and Stamina Yes       Intervention Provide advice, education, support and counseling about physical activity/exercise needs.;Develop an individualized exercise prescription for aerobic and resistive training based on initial evaluation findings, risk stratification, comorbidities and participant's personal goals.       Expected Outcomes Short Term: Increase workloads from initial exercise prescription for resistance, speed, and METs.;Short Term: Perform resistance training  exercises routinely during rehab and add in resistance training at home;Long Term: Improve  cardiorespiratory fitness, muscular endurance and strength as measured by increased METs and functional capacity (6MWT)       Able to understand and use rate of perceived exertion (RPE) scale Yes       Intervention Provide education and explanation on how to use RPE scale       Expected Outcomes Short Term: Able to use RPE daily in rehab to express subjective intensity level;Long Term:  Able to use RPE to guide intensity level when exercising independently       Able to understand and use Dyspnea scale Yes       Intervention Provide education and explanation on how to use Dyspnea scale       Expected Outcomes Short Term: Able to use Dyspnea scale daily in rehab to express subjective sense of shortness of breath during exertion;Long Term: Able to use Dyspnea scale to guide intensity level when exercising independently       Knowledge and understanding of Target Heart Rate Range (THRR) Yes       Intervention Provide education and explanation of THRR including how the numbers were predicted and where they are located for reference       Expected Outcomes Short Term: Able to state/look up THRR;Long Term: Able to use THRR to govern intensity when exercising independently;Short Term: Able to use daily as guideline for intensity in rehab       Able to check pulse independently Yes       Intervention Provide education and demonstration on how to check pulse in carotid and radial arteries.;Review the importance of being able to check your own pulse for safety during independent exercise       Expected Outcomes Short Term: Able to explain why pulse checking is important during independent exercise;Long Term: Able to check pulse independently and accurately       Understanding of Exercise Prescription Yes       Intervention Provide education, explanation, and written materials on patient's individual exercise prescription        Expected Outcomes Short Term: Able to explain program exercise prescription;Long Term: Able to explain home exercise prescription to exercise independently                Exercise Goals Re-Evaluation :  Exercise Goals Re-Evaluation     Row Name 07/02/22 0956 07/15/22 1434 07/21/22 1000         Exercise Goal Re-Evaluation   Exercise Goals Review Able to understand and use Dyspnea scale;Able to check pulse independently;Knowledge and understanding of Target Heart Rate Range (THRR);Understanding of Exercise Prescription;Able to understand and use rate of perceived exertion (RPE) scale Increase Physical Activity;Increase Strength and Stamina;Understanding of Exercise Prescription --     Comments Reviewed RPE and dyspnea scales, THR and program prescription with pt today.  Pt voiced understanding and was given a copy of goals to take home. Kimmarie is off to a good start in rehab. She has already worked up from level 1 to level 4 on the T4. She has walked up to 29 laps on the track. She also has improved to level 2 on the XR. She has done well with the treadmill on 1.9 mph and an incline of 0.5%. Nysha is also up tp an overall average MET level of 2.3 METs. We will continue monitor her progress in the program. Reviewed home exercise with pt today.  Pt plans to walk at home both on the treadmill and outside for exercise.  Reviewed THR, pulse, RPE, sign  and symptoms, pulse oximetery and when to call 911 or MD.  Also discussed weather considerations and indoor options.  Pt voiced understanding.     Expected Outcomes Short: Use RPE daily to regulate intensity. Long: Follow program prescription in THR. Short: Continue to increase speed on the treadmill. Long: Continue to increase strength and stamina. Short: Continue to walk at home on off days. Long: Continue to exercise independently.              Discharge Exercise Prescription (Final Exercise Prescription Changes):  Exercise Prescription  Changes - 07/21/22 0900       Home Exercise Plan   Plans to continue exercise at Home (comment)   Walking at Home (Treadmill)   Frequency Add 2 additional days to program exercise sessions.    Initial Home Exercises Provided 07/21/22      Oxygen   Maintain Oxygen Saturation 88% or higher             Nutrition:  Target Goals: Understanding of nutrition guidelines, daily intake of sodium <1549m, cholesterol <2081m calories 30% from fat and 7% or less from saturated fats, daily to have 5 or more servings of fruits and vegetables.  Education: All About Nutrition: -Group instruction provided by verbal, written material, interactive activities, discussions, models, and posters to present general guidelines for heart healthy nutrition including fat, fiber, MyPlate, the role of sodium in heart healthy nutrition, utilization of the nutrition label, and utilization of this knowledge for meal planning. Follow up email sent as well. Written material given at graduation. [facility]  Education need identified 06/30/22  Date 07/07/22  Educator [place] Harbor[place]ruction Review Code 1- Verbalizes Understanding       Biometrics:  Pre Biometrics - 06/30/22 1243       Pre Biometrics   Height 5' 5.5" (1.664 m)    Weight 117 lb 4.8 oz (53.2 kg)    BMI (Calculated) 19.22    Single Leg Stand 3.5 seconds              Nutrition Therapy Plan and Nutrition Goals:  Nutrition Therapy & Goals - 07/20/22 0923       Nutrition Therapy   Diet Heart healthy, low Na    Drug/Food Interactions Statins/Certain Fruits    Protein (specify units) 60-65g    Fiber 25 grams    Whole Grain Foods 3 servings    Saturated Fats 12 max. grams    Fruits and Vegetables 8 servings/day    Sodium 2 grams      Personal Nutrition Goals   Nutrition Goal ST: eat a rainbow of fruits/vegetables, choose plant foods from different groups (beans/lentils,  berries, cruciferous vegetables, leafy greens, etc), include seasonal fruits/vegetables LT: maintain heart healthy habits, include a variety of foods    Comments [age].o. F admitted to cardiac rehab s/p coronary artery stent placement. PMHx includes HTN, TIA, CAD, hypothyroidism. Relevant medications includes lipitor, CoQ10, synthroid, MVI, vit E, skin cancer, GERD. PYP Score: 82. Vegetables & Fruits 8/12. Breads, Grains & Cereals 9/12. Red & Processed Meat 12/12. Poultry 2/2. Fish & Shellfish 3/4. Beans, Nuts & Seeds 3/4. Milk & Dairy Foods 4/6. Toppings, Oils, Seasonings & Salt 20/20. Sweets, Snacks & Restaurant Food 11/14. Beverages 10/10. Ca[place]ilyneports that 3 years ago after her stroke she has changed her diet. B: oatmeal (rolled oats), cranberry juice (cranberry juice cocktail) - small glass, 1 cup of coffee  L: (largest  meal) meat, potatoes or sweet potato, non starcy vegetables, skim milk.  S: fruit or nuts D: 4 unsalted Usery crackers with peanut butter and orange, skim milk. Drinks: water. She uses canola oil for cooking and she does not salt her food. She is mindful of her food labels and is mindful of trans fat, saturated fat, and sodium. Reviewed general heart healthy eating; she had gone to nutrition education last week and reports that it was "straight foward" and does not have any further questions at this time. Encouraged her to include more variety.      Intervention Plan   Intervention Prescribe, educate and counsel regarding individualized specific dietary modifications aiming towards targeted core components such as weight, hypertension, lipid management, diabetes, heart failure and other comorbidities.    Expected Outcomes Short Term Goal: Understand basic principles of dietary content, such as calories, fat, sodium, cholesterol and nutrients.;Short Term Goal: A plan has been developed with personal nutrition goals set during dietitian appointment.;Long Term Goal: Adherence to prescribed  nutrition plan.             Nutrition Assessments:  MEDIFICTS Score Key: ?70 Need to make dietary changes  40-70 Heart Healthy Diet ? 40 Therapeutic Level Cholesterol Diet  Flowsheet Row Cardiac Rehab from 06/30/2022 in [facility] Cardiac and Pulmonary Rehab  Picture Your Plate Total Score on Admission 82      Picture Your Plate Scores: <14 Unhealthy dietary pattern with much room for improvement. 41-50 Dietary pattern unlikely to meet recommendations for good health and room for improvement. 51-60 More healthful dietary pattern, with some room for improvement.  >60 Healthy dietary pattern, although there may be some specific behaviors that could be improved.    Nutrition Goals Re-Evaluation:   Nutrition Goals Discharge (Final Nutrition Goals Re-Evaluation):   Psychosocial: Target Goals: Acknowledge presence or absence of significant depression and/or stress, maximize coping skills, provide positive support system. Participant is able to verbalize types and ability to use techniques and skills needed for reducing stress and depression.   Education: Stress, Anxiety, and Depression - Group verbal and visual presentation to define topics covered.  Reviews how body is impacted by stress, anxiety, and depression.  Also discusses healthy ways to reduce stress and to treat/manage anxiety and depression.  Written material given at graduation.   Education: Sleep Hygiene -Provides group verbal and written instruction about how sleep can affect your health.  Define sleep hygiene, discuss sleep cycles and impact of sleep habits. Review good sleep hygiene tips.    Initial Review & Psychosocial Screening:  Initial Psych Review & Screening - 06/17/22 1005       Initial Review   Current issues with Current Stress Concerns    Source of Stress Concerns Unable to participate in former interests or hobbies    Comments past couple of years not able to do what she normally does.      Family  Dynamics   Good Support System? Yes   husband,  2 daughters one in [place] one in [place]     Barriers   Psychosocial barriers to participate in program There are no identifiable barriers or psychosocial needs.      Screening Interventions   Interventions Encouraged to exercise;Provide feedback about the scores to participant;To provide support and resources with identified psychosocial needs    Expected Outcomes Short Term goal: Utilizing psychosocial counselor, staff and physician to assist with identification of specific Stressors or current issues interfering with healing process. Setting desired goal  for each stressor or current issue identified.;Long Term Goal: Stressors or current issues are controlled or eliminated.;Short Term goal: Identification and review with participant of any Quality of Life or Depression concerns found by scoring the questionnaire.;Long Term goal: The participant improves quality of Life and PHQ9 Scores as seen by post scores and/or verbalization of changes             Quality of Life Scores:   Quality of Life - 06/30/22 1245       Quality of Life   Select Quality of Life      Quality of Life Scores   Health/Function Pre 21.2 %    Socioeconomic Pre 22.56 %    Psych/Spiritual Pre 25.71 %    Family Pre 27.6 %    GLOBAL Pre 23.33 %            Scores of 19 and below usually indicate a poorer quality of life in these areas.  A difference of  2-3 points is a clinically meaningful difference.  A difference of 2-3 points in the total score of the Quality of Life Index has been associated with significant improvement in overall quality of life, self-image, physical symptoms, and general health in studies assessing change in quality of life.  PHQ-9: Review Flowsheet  More data may exist      06/30/2022 04/29/2020 01/10/2019 01/03/2019 12/29/2018  Depression screen PHQ 2/9  Decreased Interest 0 0 0 0 0  Down, Depressed, Hopeless 0 0 0 0 0  PHQ - 2 Score 0 0 0  0 0  Altered sleeping 2 - - - -  Tired, decreased energy 2 - - - -  Change in appetite 0 - - - -  Feeling bad or failure about yourself  0 - - - -  Trouble concentrating 0 - - - -  Moving slowly or fidgety/restless 2 - - - -  Suicidal thoughts 0 - - - -  PHQ-9 Score 6 - - - -  Difficult doing work/chores Somewhat difficult - - - -   Interpretation of Total Score  Total Score Depression Severity:  1-4 = Minimal depression, 5-9 = Mild depression, 10-14 = Moderate depression, 15-19 = Moderately severe depression, 20-27 = Severe depression   Psychosocial Evaluation and Intervention:  Psychosocial Evaluation - 06/17/22 1019       Psychosocial Evaluation & Interventions   Interventions Encouraged to exercise with the program and follow exercise prescription    Comments Marcie has no barriers to attending the program. [facility] is ready to get started and work on her exercise stamina. SHe ahd her husband [place].  I found a program close to thier summer place and she will contact the CR program to see if they can accept her there. Her support is her 2 daughters ;one in [place] one in [place]; and her husband.    Expected Outcomes [facility] Goddess attends all scheduled sessions, she will be able to progress with her exercise program. LTG Uchenna continues with her progression after discharge    Continue Psychosocial Services  Follow up required by staff             Psychosocial Re-Evaluation:   Psychosocial Discharge (Final Psychosocial Re-Evaluation):   Vocational Rehabilitation: Provide vocational rehab assistance to qualifying candidates.   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 Participant will state understanding/return demonstration of topics presented as noted by education test scores.  Learning Barriers/Preferences:   General Cardiac Education Topics:  AED/CPR: - Group verbal and written instruction with the use of models to demonstrate the basic use of the AED with the basic ABC's of resuscitation.   Anatomy and Cardiac Procedures: - Group verbal and visual presentation and models provide information about basic cardiac anatomy and function. Reviews the testing methods done to diagnose heart disease and the outcomes of the test results. Describes the treatment choices: Medical Management, Angioplasty, or Coronary Bypass Surgery for treating various heart conditions including Myocardial Infarction, Angina, Valve Disease, and Cardiac Arrhythmias.  Written material given at graduation. Flowsheet Row Cardiac Rehab from 07/14/2022 in [facility] Cardiac and Pulmonary Rehab  Education need identified 06/30/22       Medication Safety: - Group verbal and visual instruction to review commonly prescribed medications for heart and lung disease. Reviews the medication, class of the drug, and side effects. Includes the steps to properly store meds and maintain the prescription regimen.  Written material given at graduation. Flowsheet Row Cardiac Rehab from 07/14/2022 in [facility] Cardiac and Pulmonary Rehab  Date 07/14/22  Educator SB  Instruction Review Code 1- Verbalizes Understanding       Intimacy: - Group verbal instruction through game format to discuss how heart and lung disease can affect sexual intimacy. Written material given at graduation..   Know Your Numbers and Heart Failure: - Group verbal and visual instruction to discuss disease risk factors for  cardiac and pulmonary disease and treatment options.  Reviews associated critical values for Overweight/Obesity, Hypertension, Cholesterol, and Diabetes.  Discusses basics of heart failure: signs/symptoms and treatments.  Introduces Heart Failure Zone chart for action plan for heart failure.  Written material given at graduation. Flowsheet Row Cardiac Rehab from 07/14/2022 in [facility] Cardiac and Pulmonary Rehab  Education need identified 06/30/22       Infection Prevention: - Provides verbal and written material to individual with discussion of infection control including proper hand washing and proper equipment cleaning during exercise session. Flowsheet Row Cardiac Rehab from 07/14/2022 in [facility] Cardiac and Pulmonary Rehab  Education need identified 06/30/22  Date 06/30/22  Educator [place]  Instruction Review Code 1- Verbalizes Understanding       Falls Prevention: - Provides verbal and written material to individual with discussion of falls prevention and safety. Flowsheet Row Cardiac Rehab from 06/17/2022 in [facility] Cardiac and Pulmonary Rehab  Date 06/17/22  Educator SB  Instruction Review Code 1- Verbalizes Understanding       Other: -Provides group and verbal instruction on various topics (see comments)   Knowledge Questionnaire Score:  Knowledge Questionnaire Score - 06/30/22 1245       Knowledge Questionnaire Score   Pre Score 23/26             Core Components/Risk Factors/Patient Goals at Admission:  Personal Goals and Risk Factors at Admission - 06/30/22 1305       Core Components/Risk Factors/Patient Goals on Admission    Weight Management Yes;Weight Maintenance  Intervention Weight Management: Provide education and appropriate resources to help participant work on and attain dietary goals.;Weight Management: Develop a combined nutrition and exercise program designed to reach desired caloric intake, while maintaining appropriate intake of nutrient and fiber, sodium and  fats, and appropriate energy expenditure required for the weight goal.    Admit Weight 117 lb (53.1 kg)    Goal Weight: Short Term 117 lb (53.1 kg)    Goal Weight: Long Term 117 lb (53.1 kg)    Expected Outcomes Short Term: Continue to assess and modify interventions until short term weight is achieved;Long Term: Adherence to nutrition and physical activity/exercise program aimed toward attainment of established weight goal;Weight Maintenance: Understanding of the daily nutrition guidelines, which includes 25-35% calories from fat, 7% or less cal from saturated fats, less than 232m cholesterol, less than 1.5gm of sodium, & 5 or more servings of fruits and vegetables daily;Understanding recommendations for meals to include 15-35% energy as protein, 25-35% energy from fat, 35-60% energy from carbohydrates, less than 203mof dietary cholesterol, 20-35 gm of total fiber daily;Understanding of distribution of calorie intake throughout the day with the consumption of 4-5 meals/snacks    Hypertension Yes    Intervention Provide education on lifestyle modifcations including regular physical activity/exercise, weight management, moderate sodium restriction and increased consumption of fresh fruit, vegetables, and low fat dairy, alcohol moderation, and smoking cessation.;Monitor prescription use compliance.    Expected Outcomes Short Term: Continued assessment and intervention until BP is < 140/9081mG in hypertensive participants. < 130/64m65m in hypertensive participants with diabetes, heart failure or chronic kidney disease.;Long Term: Maintenance of blood pressure at goal levels.    Lipids Yes    Intervention Provide education and support for participant on nutrition & aerobic/resistive exercise along with prescribed medications to achieve LDL <70mg40mL >40mg.41mExpected Outcomes Short Term: Participant states understanding of desired cholesterol values and is compliant with medications prescribed.  Participant is following exercise prescription and nutrition guidelines.;Long Term: Cholesterol controlled with medications as prescribed, with individualized exercise RX and with personalized nutrition plan. Value goals: LDL < 70mg, 59m> 40 mg.             Education:Diabetes - Individual verbal and written instruction to review signs/symptoms of diabetes, desired ranges of glucose level fasting, after meals and with exercise. Acknowledge that pre and post exercise glucose checks will be done for 3 sessions at entry of program.   Core Components/Risk Factors/Patient Goals Review:    Core Components/Risk Factors/Patient Goals at Discharge (Final Review):    ITP Comments:  ITP Comments     Row Name 06/17/22 1028 06/30/22 1233 06/30/22 1235 07/02/22 0956 07/20/22 0937   ITP Comments Virtual orientation call completed today. shehas an appointment on Date: 06/30/2022  for EP eval and gym Orientation.  [facility] . Completed 6MWT and gym orientation. Initial ITP created and sent for review to Dr. Mark MiEmily Filbertal Director. EKG  strips sent to Dr. ChristoHarrell Gavefull day of exercise!  Patient was oriented to gym and equipment including functions, settings, policies, and procedures.  Patient's individual exercise prescription and treatment plan were reviewed.  All starting workloads were established based on the results of the 6 minute walk test done at initial orientation visit.  The plan for exercise progression was also introduced and progression will be customized based on patient's performance and goals. Completed initial RD consultation    Row Name  07/21/22 1005           ITP Comments 30 Day review completed. Medical Director ITP review done, changes made as directed, and signed approval by Medical Director.                Comments:

## 2022-07-21 NOTE — Progress Notes (Signed)
Daily Session Note  Patient Details  Name: Shannon [name] MRN: [id number] Date of Birth: 1946/01/15 Referring Provider:   Flowsheet Row Cardiac Rehab from 06/30/2022 in [facility] Cardiac and Pulmonary Rehab  Referring Provider Buford Dresser MD       Encounter Date: 07/21/2022  Check In:  Session Check In - 07/21/22 1554       Check-In   Supervising physician immediately available to respond to emergencies See telemetry face sheet for immediately available ER MD    Location [facility]-Cardiac & Pulmonary Rehab    Staff Present Nyoka Cowden, RN, BSN, Ardeth Sportsman, RDN, LDN;Jessica [place], MA, RCEP, CCRP, [place], MS, ASCM CEP, Exercise Physiologist    Virtual Visit No    Medication changes reported     No    Fall or balance concerns reported    No    Tobacco Cessation No Change    Warm-up and Cool-down Performed on first and last piece of equipment    Resistance Training Performed Yes    VAD Patient? No    PAD/SET Patient? No      Pain Assessment   Currently in Pain? No/denies               Exercise Prescription Changes - 07/21/22 0900       Home Exercise Plan   Plans to continue exercise at Home (comment)   Walking at Home (Treadmill)   Frequency Add 2 additional days to program exercise sessions.    Initial Home Exercises Provided 07/21/22      Oxygen   Maintain Oxygen Saturation 88% or higher             Social History   Tobacco Use  Smoking Status Never  Smokeless Tobacco Never    Goals Met:  Independence with exercise equipment Exercise tolerated well No report of concerns or symptoms today  Goals Unmet:  Not Applicable  Comments: Pt able to follow exercise prescription today without complaint.  Will continue to monitor for progression.    Dr. Emily Filbert is Medical Director for [place].  Dr. Ottie Glazier is Medical Director for [facility] Pulmonary Rehabilitation.

## 2022-07-23 ENCOUNTER — Encounter: Payer: Medicare Other | Admitting: *Deleted

## 2022-07-23 DIAGNOSIS — Z955 Presence of coronary angioplasty implant and graft: Secondary | ICD-10-CM | POA: Diagnosis not present

## 2022-07-23 NOTE — Progress Notes (Signed)
Daily Session Note  Patient Details  Name: Shannon [name] MRN: [id number] Date of Birth: 09-27-1946 Referring Provider:   Flowsheet Row Cardiac Rehab from 06/30/2022 in [facility] Cardiac and Pulmonary Rehab  Referring Provider Buford Dresser MD       Encounter Date: 07/23/2022  Check In:  Session Check In - 07/23/22 0939       Check-In   Supervising physician immediately available to respond to emergencies See telemetry face sheet for immediately available ER MD    Location [facility]-Cardiac & Pulmonary Rehab    Staff Present Nyoka Cowden, RN, BSN, [place], BS, RRT, CPFT;Jessica Luan [place], MA, RCEP, CCRP, CCET    Virtual Visit No    Medication changes reported     No    Fall or balance concerns reported    No    Tobacco Cessation No Change    Warm-up and Cool-down Performed on first and last piece of equipment    Resistance Training Performed Yes    VAD Patient? No    PAD/SET Patient? No      Pain Assessment   Currently in Pain? No/denies                Social History   Tobacco Use  Smoking Status Never  Smokeless Tobacco Never    Goals Met:  Independence with exercise equipment Exercise tolerated well No report of concerns or symptoms today  Goals Unmet:  Not Applicable  Comments: .exgoo   Dr. Emily Filbert is Medical Director for [place].  Dr. Ottie Glazier is Medical Director for [facility] Pulmonary Rehabilitation.

## 2022-07-26 ENCOUNTER — Encounter: Payer: Medicare Other | Admitting: *Deleted

## 2022-07-26 DIAGNOSIS — Z955 Presence of coronary angioplasty implant and graft: Secondary | ICD-10-CM

## 2022-07-26 NOTE — Progress Notes (Signed)
Daily Session Note  Patient Details  Name: Shannon [name] MRN: [id number] Date of Birth: 1946/09/15 Referring Provider:   Flowsheet Row Cardiac Rehab from 06/30/2022 in [facility] Cardiac and Pulmonary Rehab  Referring Provider Buford Dresser MD       Encounter Date: 07/26/2022  Check In:  Session Check In - 07/26/22 0935       Check-In   Supervising physician immediately available to respond to emergencies See telemetry face sheet for immediately available ER MD    Location [facility]-Cardiac & Pulmonary Rehab    Staff Present Heath Lark, RN, BSN, CCRP;Laureen Owens Shark, BS, RRT, CPFT;Kelly Amedeo Plenty, BS, ACSM CEP, Exercise Physiologist    Virtual Visit No    Medication changes reported     No    Fall or balance concerns reported    No    Warm-up and Cool-down Performed on first and last piece of equipment    Resistance Training Performed Yes    VAD Patient? No    PAD/SET Patient? No      Pain Assessment   Currently in Pain? No/denies                Social History   Tobacco Use  Smoking Status Never  Smokeless Tobacco Never    Goals Met:  Independence with exercise equipment Exercise tolerated well No report of concerns or symptoms today  Goals Unmet:  Not Applicable  Comments: Pt able to follow exercise prescription today without complaint.  Will continue to monitor for progression.    Dr. Emily Filbert is Medical Director for [place].  Dr. Ottie Glazier is Medical Director for [facility] Pulmonary Rehabilitation.

## 2022-07-28 ENCOUNTER — Encounter: Payer: Medicare Other | Admitting: *Deleted

## 2022-07-28 DIAGNOSIS — Z955 Presence of coronary angioplasty implant and graft: Secondary | ICD-10-CM | POA: Diagnosis not present

## 2022-07-28 NOTE — Progress Notes (Signed)
Daily Session Note  Patient Details  Name: Shannon [name] MRN: [id number] Date of Birth: [birth year] Referring Provider:   Flowsheet Row Cardiac Rehab from 06/30/2022 in [facility] Cardiac and Pulmonary Rehab  Referring Provider Buford Dresser MD       Encounter Date: 07/28/2022  Check In:  Session Check In - 07/28/22 0941       Check-In   Supervising physician immediately available to respond to emergencies See telemetry face sheet for immediately available ER MD    Location [facility]-Cardiac & Pulmonary Rehab    Staff Present Heath Lark, RN, BSN, CCRP;Jessica [place], MA, RCEP, CCRP, CCET;Joseph [place], RCP,RRT,BSRT;[place], [place], Exercise Physiologist    Virtual Visit No    Medication changes reported     No    Fall or balance concerns reported    No    Warm-up and Cool-down Performed on first and last piece of equipment    Resistance Training Performed Yes    VAD Patient? No    PAD/SET Patient? No      Pain Assessment   Currently in Pain? No/denies                Social History   Tobacco Use  Smoking Status Never  Smokeless Tobacco Never    Goals Met:  Independence with exercise equipment Exercise tolerated well No report of concerns or symptoms today  Goals Unmet:  Not Applicable  Comments: Pt able to follow exercise prescription today without complaint.  Will continue to monitor for progression.    Dr. Emily Filbert is Medical Director for [place].  Dr. Ottie Glazier is Medical Director for [facility] Pulmonary Rehabilitation.

## 2022-07-30 ENCOUNTER — Encounter: Payer: Medicare Other | Admitting: *Deleted

## 2022-07-30 DIAGNOSIS — Z955 Presence of coronary angioplasty implant and graft: Secondary | ICD-10-CM | POA: Diagnosis not present

## 2022-07-30 NOTE — Progress Notes (Signed)
Daily Session Note  Patient Details  Name: Shannon [name] MRN: [id number] Date of Birth: 07/12/1946 Referring Provider:   Flowsheet Row Cardiac Rehab from 06/30/2022 in [facility] Cardiac and Pulmonary Rehab  Referring Provider Christopher, Bridgette MD       Encounter Date: 07/30/2022  Check In:  Session Check In - 07/30/22 0917       Check-In   Supervising physician immediately available to respond to emergencies See telemetry face sheet for immediately available ER MD    Location [facility]-Cardiac & Pulmonary Rehab    Staff Present Meredith Craven, RN BSN;Joseph Hood, RCP,RRT,BSRT;Jessica [place], MA, RCEP, CCRP, CCET    Virtual Visit No    Medication changes reported     No    Fall or balance concerns reported    No    Warm-up and Cool-down Performed on first and last piece of equipment    Resistance Training Performed Yes    VAD Patient? No    PAD/SET Patient? No      Pain Assessment   Currently in Pain? No/denies                Social History   Tobacco Use  Smoking Status Never  Smokeless Tobacco Never    Goals Met:  Independence with exercise equipment Exercise tolerated well No report of concerns or symptoms today Strength training completed today  Goals Unmet:  Not Applicable  Comments: Pt able to follow exercise prescription today without complaint.  Will continue to monitor for progression.    Dr. Mark Miller is Medical Director for [facility].  Dr. Fuad Aleskerov is Medical Director for [facility] Pulmonary Rehabilitation. 

## 2022-08-02 ENCOUNTER — Encounter: Payer: Medicare Other | Admitting: *Deleted

## 2022-08-02 DIAGNOSIS — Z955 Presence of coronary angioplasty implant and graft: Secondary | ICD-10-CM

## 2022-08-02 NOTE — Progress Notes (Signed)
Daily Session Note  Patient Details  Name: Shannon [name] MRN: [id number] Date of Birth: August 04, 1946 Referring Provider:   Flowsheet Row Cardiac Rehab from 06/30/2022 in [facility] Cardiac and Pulmonary Rehab  Referring Provider Buford Dresser MD       Encounter Date: 08/02/2022  Check In:  Session Check In - 08/02/22 1009       Check-In   Supervising physician immediately available to respond to emergencies See telemetry face sheet for immediately available ER MD    Location [facility]-Cardiac & Pulmonary Rehab    Staff Present Heath Lark, RN, BSN, CCRP;Joseph [place], RCP,RRT,BSRT;Kelly [place], BS, ACSM CEP, Exercise Physiologist    Virtual Visit No    Medication changes reported     No    Fall or balance concerns reported    No    Warm-up and Cool-down Performed on first and last piece of equipment    Resistance Training Performed Yes    VAD Patient? No    PAD/SET Patient? No      Pain Assessment   Currently in Pain? No/denies                Social History   Tobacco Use  Smoking Status Never  Smokeless Tobacco Never    Goals Met:  Independence with exercise equipment Exercise tolerated well No report of concerns or symptoms today  Goals Unmet:  Not Applicable  Comments: Pt able to follow exercise prescription today without complaint.  Will continue to monitor for progression.    Dr. Emily Filbert is Medical Director for [place].  Dr. Ottie Glazier is Medical Director for [facility] Pulmonary Rehabilitation.

## 2022-08-04 ENCOUNTER — Encounter: Payer: Medicare Other | Admitting: *Deleted

## 2022-08-04 DIAGNOSIS — Z955 Presence of coronary angioplasty implant and graft: Secondary | ICD-10-CM | POA: Diagnosis not present

## 2022-08-04 NOTE — Progress Notes (Signed)
Daily Session Note  Patient Details  Name: Shannon [name] MRN: [id number] Date of Birth: 04/04/1946 Referring Provider:   Flowsheet Row Cardiac Rehab from 06/30/2022 in [facility] Cardiac and Pulmonary Rehab  Referring Provider Christopher, Bridgette MD       Encounter Date: 08/04/2022  Check In:  Session Check In - 08/04/22 1018       Check-In   Supervising physician immediately available to respond to emergencies See telemetry face sheet for immediately available ER MD    Location [facility]-Cardiac & Pulmonary Rehab    Staff Present Susanne Bice, RN, BSN, CCRP;Jessica Hawkins, MA, RCEP, CCRP, CCET;Melissa Caiola, RDN, LDN;Joseph Hood, RCP,RRT,BSRT    Virtual Visit No    Medication changes reported     No    Fall or balance concerns reported    No    Warm-up and Cool-down Performed on first and last piece of equipment    Resistance Training Performed Yes    VAD Patient? No    PAD/SET Patient? No      Pain Assessment   Currently in Pain? No/denies                Social History   Tobacco Use  Smoking Status Never  Smokeless Tobacco Never    Goals Met:  Independence with exercise equipment Exercise tolerated well No report of concerns or symptoms today  Goals Unmet:  Not Applicable  Comments: Pt able to follow exercise prescription today without complaint.  Will continue to monitor for progression.    Dr. Mark Miller is Medical Director for [facility].  Dr. Fuad Aleskerov is Medical Director for [facility] Pulmonary Rehabilitation. 

## 2022-08-06 ENCOUNTER — Encounter: Payer: Medicare Other | Admitting: *Deleted

## 2022-08-06 DIAGNOSIS — Z955 Presence of coronary angioplasty implant and graft: Secondary | ICD-10-CM | POA: Diagnosis not present

## 2022-08-06 NOTE — Progress Notes (Signed)
Daily Session Note  Patient Details  Name: Shannon [name] MRN: [id number] Date of Birth: 09-Oct-1946 Referring Provider:   Flowsheet Row Cardiac Rehab from 06/30/2022 in [facility] Cardiac and Pulmonary Rehab  Referring Provider Buford Dresser MD       Encounter Date: 08/06/2022  Check In:  Session Check In - 08/06/22 3903       Check-In   Supervising physician immediately available to respond to emergencies See telemetry face sheet for immediately available ER MD    Location [facility]-Cardiac & Pulmonary Rehab    Staff Present Renita Papa, RN BSN;Joseph [place], RCP,RRT,BSRT;Jessica [place], [place], RCEP, CCRP, CCET    Virtual Visit No    Medication changes reported     No    Fall or balance concerns reported    No    Warm-up and Cool-down Performed on first and last piece of equipment    Resistance Training Performed Yes    VAD Patient? No    PAD/SET Patient? No      Pain Assessment   Currently in Pain? No/denies                Social History   Tobacco Use  Smoking Status Never  Smokeless Tobacco Never    Goals Met:  Independence with exercise equipment Exercise tolerated well No report of concerns or symptoms today Strength training completed today  Goals Unmet:  Not Applicable  Comments: Pt able to follow exercise prescription today without complaint.  Will continue to monitor for progression.    Dr. Emily Filbert is Medical Director for [place].  Dr. Ottie Glazier is Medical Director for [facility] Pulmonary Rehabilitation.

## 2022-08-09 ENCOUNTER — Encounter: Payer: Medicare Other | Admitting: *Deleted

## 2022-08-09 DIAGNOSIS — Z955 Presence of coronary angioplasty implant and graft: Secondary | ICD-10-CM

## 2022-08-09 NOTE — Progress Notes (Signed)
Daily Session Note  Patient Details  Name: Shannon [name] MRN: [id number] Date of Birth: 10/28/1946 Referring Provider:   Flowsheet Row Cardiac Rehab from 06/30/2022 in [facility] Cardiac and Pulmonary Rehab  Referring Provider Christopher, Bridgette MD       Encounter Date: 08/09/2022  Check In:  Session Check In - 08/09/22 0943       Check-In   Supervising physician immediately available to respond to emergencies See telemetry face sheet for immediately available ER MD    Location [facility]-Cardiac & Pulmonary Rehab    Staff Present Susanne Bice, RN, BSN, CCRP;Joseph Hood, RCP,RRT,BSRT;Kelly [place], BS, ACSM CEP, Exercise Physiologist    Virtual Visit No    Medication changes reported     No    Fall or balance concerns reported    No    Warm-up and Cool-down Performed on first and last piece of equipment    Resistance Training Performed Yes    VAD Patient? No    PAD/SET Patient? No      Pain Assessment   Currently in Pain? No/denies                Social History   Tobacco Use  Smoking Status Never  Smokeless Tobacco Never    Goals Met:  Independence with exercise equipment Exercise tolerated well No report of concerns or symptoms today  Goals Unmet:  Not Applicable  Comments: Pt able to follow exercise prescription today without complaint.  Will continue to monitor for progression.    Dr. Mark Miller is Medical Director for [facility].  Dr. Fuad Aleskerov is Medical Director for [facility] Pulmonary Rehabilitation. 

## 2022-08-11 ENCOUNTER — Encounter: Payer: Medicare Other | Admitting: *Deleted

## 2022-08-11 DIAGNOSIS — Z955 Presence of coronary angioplasty implant and graft: Secondary | ICD-10-CM | POA: Diagnosis not present

## 2022-08-11 NOTE — Progress Notes (Signed)
Daily Session Note  Patient Details  Name: Shannon [name] MRN: [id number] Date of Birth: 1946/08/08 Referring Provider:   Flowsheet Row Cardiac Rehab from 06/30/2022 in [facility] Cardiac and Pulmonary Rehab  Referring Provider Buford Dresser MD       Encounter Date: 08/11/2022  Check In:  Session Check In - 08/11/22 1010       Check-In   Supervising physician immediately available to respond to emergencies See telemetry face sheet for immediately available ER MD    Location [facility]-Cardiac & Pulmonary Rehab    Staff Present Nyoka Cowden, RN, BSN, Fenton Foy, BS, Exercise Physiologist;Joseph [place], RCP,RRT,BSRT;Melissa Palacios, [place], LDN    Virtual Visit No    Medication changes reported     No    Fall or balance concerns reported    No    Tobacco Cessation No Change    Warm-up and Cool-down Performed on first and last piece of equipment    Resistance Training Performed Yes    VAD Patient? No    PAD/SET Patient? No      Pain Assessment   Currently in Pain? No/denies                Social History   Tobacco Use  Smoking Status Never  Smokeless Tobacco Never    Goals Met:  Independence with exercise equipment Exercise tolerated well No report of concerns or symptoms today  Goals Unmet:  Not Applicable  Comments: Pt able to follow exercise prescription today without complaint.  Will continue to monitor for progression.    Dr. Emily Filbert is Medical Director for [facility].  Dr. Ottie Glazier is Medical Director for [facility] Pulmonary Rehabilitation.

## 2022-08-18 ENCOUNTER — Encounter: Payer: Self-pay | Admitting: *Deleted

## 2022-08-18 ENCOUNTER — Encounter: Payer: Medicare Other | Attending: Cardiology | Admitting: *Deleted

## 2022-08-18 DIAGNOSIS — Z955 Presence of coronary angioplasty implant and graft: Secondary | ICD-10-CM

## 2022-08-18 NOTE — Progress Notes (Signed)
Cardiac Individual Treatment Plan  Patient Details  Name: Shannon [name] MRN: [id number] Date of Birth: 12/13/1946 Referring Provider:   Flowsheet Row Cardiac Rehab from 06/30/2022 in [facility] Cardiac and Pulmonary Rehab  Referring Provider Buford Dresser MD       Initial Encounter Date:  Flowsheet Row Cardiac Rehab from 06/30/2022 in [facility] Cardiac and Pulmonary Rehab  Date 06/30/22       Visit Diagnosis: Status post coronary artery stent placement  Patient's Home Medications on Admission:  Current Outpatient Medications:    acetaminophen (TYLENOL) 325 MG tablet, Take 325 mg by mouth every 6 (six) hours as needed for headache. , Disp: , Rfl:    aspirin EC 81 MG EC tablet, Take 1 tablet (81 mg total) by mouth daily., Disp: 30 tablet, Rfl: 0   atorvastatin (LIPITOR) 40 MG tablet, Take 1 tablet (40 mg total) by mouth daily at 6 PM., Disp: 30 tablet, Rfl: 0   Calcium Carb-Cholecalciferol (CALCIUM 600 + D PO), Take 300-600 mg by mouth See admin instructions. Take 600 mg after breakfast, 600 mg after lunch, and 600 mg after dinner, Disp: , Rfl:    Coenzyme Q10 50 MG CAPS, Take 50 mg by mouth daily. , Disp: , Rfl:    levothyroxine (SYNTHROID) 75 MCG tablet, Take 75 mcg by mouth every Tuesday, Thursday, Saturday, and Sunday. , Disp: , Rfl:    levothyroxine (SYNTHROID, LEVOTHROID) 50 MCG tablet, Take 50 mcg by mouth every Monday, Wednesday, and Friday. , Disp: , Rfl:    loratadine (CLARITIN) 10 MG tablet, Take 10 mg by mouth daily., Disp: , Rfl:    metoprolol succinate (TOPROL-XL) 25 MG 24 hr tablet, Take 0.5 tablets (12.5 mg total) by mouth daily., Disp: 45 tablet, Rfl: 3   Multiple Vitamin (MULTIVITAMIN) capsule, Take 1 capsule by mouth daily. Senior, Disp: , Rfl:    nitroGLYCERIN (NITROSTAT) 0.4 MG SL tablet, Place 1 tablet (0.4 mg total) under the tongue every 5 (five) minutes as needed for chest pain., Disp: 25 tablet, Rfl: 3   Polyvinyl Alcohol-Povidone PF (REFRESH) 1.4-0.6 % SOLN, Apply  1 drop to eye as needed (dry eye). Both eyes, Disp: , Rfl:    Propylene Glycol (SYSTANE BALANCE OP), Apply 1 drop to eye as needed (dry eye). Both eyes., Disp: , Rfl:    ticagrelor (BRILINTA) 90 MG TABS tablet, Take 1 tablet (90 mg total) by mouth 2 (two) times daily., Disp: 180 tablet, Rfl: 3   vitamin E 400 UNIT capsule, Take 400 Units by mouth every other day. 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   Arthritis    Cancer ([place])    skin - squamous cell   Complication of anesthesia    " I had difficulty coming out of it"   Dyspnea    with exertion   Family history of adverse reaction to anesthesia    " My daughter has a problem coming ou to it"   GERD (gastroesophageal reflux disease)    Headache    migraines in the past   Hypertension    Hypothyroidism    Stroke ([place]) 11/2018   some weakness on right side    SVT (supraventricular tachycardia) ([facility])    Thyroid disease     Tobacco Use: Social History   Tobacco Use  Smoking Status Never  Smokeless Tobacco Never    Labs: Review Flowsheet  Latest Ref Rng & Units 12/09/2018 12/24/2018 01/04/2019 09/23/2020  Labs for ITP Cardiac and Pulmonary Rehab  Cholestrol 0 - 200 mg/dL 199  - - -  LDL (calc) 0 - 99 mg/dL 132  - - -  HDL-C >40 mg/dL 45  - - -  Trlycerides <150 mg/dL 110  - - -  Hemoglobin A1c 4.8 - 5.6 % 5.4  - - -  PH, Arterial 7.350 - 7.450 - - 7.415  -  PCO2 arterial 32.0 - 48.0 mmHg - - 33.8  -  Bicarbonate 20.0 - 28.0 mmol/L - - 21.7  -  TCO2 22 - 32 mmol/L - _0 Acid-base deficit 0.0 - 2.0 mmol/L - - 3.0  -  O2 Saturation % - - 100.0  -     Exercise Target Goals: Exercise Program Goal: Individual exercise prescription set using results from initial 6 min walk test and THRR while considering  patient's activity barriers and safety.   Exercise Prescription Goal: Initial exercise prescription builds to 30-45  minutes a day of aerobic activity, 2-3 days per week.  Home exercise guidelines will be given to patient during program as part of exercise prescription that the participant will acknowledge.   Education: Aerobic Exercise: - Group verbal and visual presentation on the components of exercise prescription. Introduces F.I.T.T principle from ACSM for exercise prescriptions.  Reviews F.I.T.T. principles of aerobic exercise including progression. Written material given at graduation. Flowsheet Row Cardiac Rehab from 08/18/2022 in [facility] Cardiac and Pulmonary Rehab  Date 08/18/22  Educator [facility]  Instruction Review Code 1- Verbalizes Understanding       Education: Resistance Exercise: - Group verbal and visual presentation on the components of exercise prescription. Introduces F.I.T.T principle from ACSM for exercise prescriptions  Reviews F.I.T.T. principles of resistance exercise including progression. Written material given at graduation.    Education: Exercise & Equipment Safety: - Individual verbal instruction and demonstration of equipment use and safety with use of the equipment. Flowsheet Row Cardiac Rehab from 08/18/2022 in [facility] Cardiac and Pulmonary Rehab  Education need identified 06/30/22  Date 06/30/22  Educator [place]  Instruction Review Code 1- Verbalizes Understanding       Education: Exercise Physiology & General Exercise Guidelines: - Group verbal and written instruction with models to review the exercise physiology of the cardiovascular system and associated critical values. Provides general exercise guidelines with specific guidelines to those with heart or lung disease.  Flowsheet Row Cardiac Rehab from 08/18/2022 in [facility] Cardiac and Pulmonary Rehab  Date 08/11/22  Educator [facility]  Instruction Review Code 1- Verbalizes Understanding       Education: Flexibility, Balance, Mind/Body Relaxation: - Group verbal and visual presentation with interactive activity on the components of  exercise prescription. Introduces F.I.T.T principle from ACSM for exercise prescriptions. Reviews F.I.T.T. principles of flexibility and balance exercise training including progression. Also discusses the mind body connection.  Reviews various relaxation techniques to help reduce and manage stress (i.e. Deep breathing, progressive muscle relaxation, and visualization). Balance handout provided to take home. Written material given at graduation.   Activity Barriers & Risk Stratification:  Activity Barriers & Cardiac Risk Stratification - 06/30/22 1243       Activity Barriers & Cardiac Risk Stratification   Activity Barriers Shortness of Breath;Deconditioning;Muscular Weakness;Other (comment)    Comments CVA in 2019 --> left sided weakness    Cardiac Risk Stratification High             6 Minute  Walk:  6 Minute Walk     Row Name 06/30/22 1303         6 Minute Walk   Phase Initial     Distance 1140 feet     Walk Time 6 minutes     # of Rest Breaks 0     MPH 2.15     METS 2.63     RPE 10     Perceived Dyspnea  0     VO2 Peak 9.23     Symptoms Yes (comment)     Comments Fatigue     Resting HR 71 bpm     Resting BP 118/74     Resting Oxygen Saturation  99 %     Exercise Oxygen Saturation  during 6 min walk 95 %     Max Ex. HR 83 bpm     Max Ex. BP 144/76     2 Minute Post BP 124/74              Oxygen Initial Assessment:   Oxygen Re-Evaluation:   Oxygen Discharge (Final Oxygen Re-Evaluation):   Initial Exercise Prescription:  Initial Exercise Prescription - 06/30/22 1500       Date of Initial Exercise RX and Referring Provider   Date 06/30/22    Referring Provider Buford Dresser MD      Oxygen   Maintain Oxygen Saturation 88% or higher      Treadmill   MPH 1.9    Grade 0.5    Minutes 15    METs 2.59      NuStep   Level 1    SPM 80    Minutes 15    METs 2.6      REL-XR   Level 1    Speed 50    Minutes 15    METs 2.6      T5  Nustep   Level 1    SPM 80    Minutes 15    METs 2.6      Prescription Details   Frequency (times per week) 3    Duration Progress to 30 minutes of continuous aerobic without signs/symptoms of physical distress      Intensity   THRR 40-80% of Max Heartrate 100 - 129    Ratings of Perceived Exertion 11-13    Perceived Dyspnea 0-4      Progression   Progression Continue to progress workloads to maintain intensity without signs/symptoms of physical distress.      Resistance Training   Training Prescription Yes    Weight 2 lb    Reps 10-15             Perform Capillary Blood Glucose checks as needed.  Exercise Prescription Changes:   Exercise Prescription Changes     Row Name 06/30/22 1500 07/15/22 1400 07/21/22 0900 07/28/22 1300 08/10/22 1500     Response to Exercise   Blood Pressure (Admit) 118/74 126/68 -- 108/62 138/74   Blood Pressure (Exercise) 144/76 158/72 -- 172/84 --   Blood Pressure (Exit) 124/74 118/68 -- 132/70 124/62   Heart Rate (Admit) 71 bpm 87 bpm -- 75 bpm 73 bpm   Heart Rate (Exercise) 83 bpm 101 bpm -- 100 bpm 97 bpm   Heart Rate (Exit) 70 bpm 81 bpm -- 79 bpm 81 bpm   Oxygen Saturation (Admit) 99 % -- -- -- --   Oxygen Saturation (Exercise) 95 % -- -- -- --   Oxygen Saturation (Exit) 96 % -- -- -- --  Rating of Perceived Exertion (Exercise) 10 13 -- 14 13   Perceived Dyspnea (Exercise) 0 -- -- -- --   Symptoms fatigued none -- none none   Comments walk test results 1st full day of exercise -- -- --   Duration -- Progress to 30 minutes of  aerobic without signs/symptoms of physical distress -- Progress to 30 minutes of  aerobic without signs/symptoms of physical distress Continue with 30 min of aerobic exercise without signs/symptoms of physical distress.   Intensity -- THRR unchanged -- THRR unchanged THRR unchanged     Progression   Progression -- Continue to progress workloads to maintain intensity without signs/symptoms of physical  distress. -- Continue to progress workloads to maintain intensity without signs/symptoms of physical distress. Continue to progress workloads to maintain intensity without signs/symptoms of physical distress.   Average METs -- 2.3 -- 2.6 2.95     Resistance Training   Training Prescription -- Yes -- Yes Yes   Weight -- 2 lb -- 3 lb 3 lb   Reps -- 10-15 -- 10-15 10-15     Interval Training   Interval Training -- -- -- No No     Treadmill   MPH -- 1.9 -- 2 2.2   Grade -- 0.5 -- 0.5 0.5   Minutes -- 15 -- 15 15   METs -- 2.59 -- 2.67 2.84     Recumbant Bike   Level -- -- -- 1.5 3   Watts -- -- -- -- 22   Minutes -- -- -- 15 15   METs -- -- -- 3.47 3.47     NuStep   Level -- 4 -- 2 4   Minutes -- 15 -- 15 15   METs -- 2.1 -- 1.8 2.2     REL-XR   Level -- 2 -- 2 --   Minutes -- 15 -- 15 --   METs -- 3.8 -- 1.9 --     Track   Laps -- 29 -- 25 --   Minutes -- 15 -- 15 --   METs -- 2.58 -- 2.36 --     Home Exercise Plan   Plans to continue exercise at -- -- Home (comment)  Walking at Home (Treadmill) Home (comment)  Walking at Home (Treadmill) Home (comment)  Walking at Home (Treadmill)   Frequency -- -- Add 2 additional days to program exercise sessions. Add 2 additional days to program exercise sessions. Add 2 additional days to program exercise sessions.   Initial Home Exercises Provided -- -- 07/21/22 07/21/22 07/21/22     Oxygen   Maintain Oxygen Saturation -- 88% or higher 88% or higher 88% or higher 88% or higher            Exercise Comments:   Exercise Comments     Row Name 07/02/22 0956           Exercise Comments First full day of exercise!  Patient was oriented to gym and equipment including functions, settings, policies, and procedures.  Patient's individual exercise prescription and treatment plan were reviewed.  All starting workloads were established based on the results of the 6 minute walk test done at initial orientation visit.  The plan for  exercise progression was also introduced and progression will be customized based on patient's performance and goals.                Exercise Goals and Review:   Exercise Goals     Row Name 06/30/22 1304  Exercise Goals   Increase Physical Activity Yes       Intervention Provide advice, education, support and counseling about physical activity/exercise needs.;Develop an individualized exercise prescription for aerobic and resistive training based on initial evaluation findings, risk stratification, comorbidities and participant's personal goals.       Expected Outcomes Short Term: Attend rehab on a regular basis to increase amount of physical activity.;Long Term: Add in home exercise to make exercise part of routine and to increase amount of physical activity.;Long Term: Exercising regularly at least 3-5 days a week.       Increase Strength and Stamina Yes       Intervention Provide advice, education, support and counseling about physical activity/exercise needs.;Develop an individualized exercise prescription for aerobic and resistive training based on initial evaluation findings, risk stratification, comorbidities and participant's personal goals.       Expected Outcomes Short Term: Increase workloads from initial exercise prescription for resistance, speed, and METs.;Short Term: Perform resistance training exercises routinely during rehab and add in resistance training at home;Long Term: Improve cardiorespiratory fitness, muscular endurance and strength as measured by increased METs and functional capacity (6MWT)       Able to understand and use rate of perceived exertion (RPE) scale Yes       Intervention Provide education and explanation on how to use RPE scale       Expected Outcomes Short Term: Able to use RPE daily in rehab to express subjective intensity level;Long Term:  Able to use RPE to guide intensity level when exercising independently       Able to understand  and use Dyspnea scale Yes       Intervention Provide education and explanation on how to use Dyspnea scale       Expected Outcomes Short Term: Able to use Dyspnea scale daily in rehab to express subjective sense of shortness of breath during exertion;Long Term: Able to use Dyspnea scale to guide intensity level when exercising independently       Knowledge and understanding of Target Heart Rate Range (THRR) Yes       Intervention Provide education and explanation of THRR including how the numbers were predicted and where they are located for reference       Expected Outcomes Short Term: Able to state/look up THRR;Long Term: Able to use THRR to govern intensity when exercising independently;Short Term: Able to use daily as guideline for intensity in rehab       Able to check pulse independently Yes       Intervention Provide education and demonstration on how to check pulse in carotid and radial arteries.;Review the importance of being able to check your own pulse for safety during independent exercise       Expected Outcomes Short Term: Able to explain why pulse checking is important during independent exercise;Long Term: Able to check pulse independently and accurately       Understanding of Exercise Prescription Yes       Intervention Provide education, explanation, and written materials on patient's individual exercise prescription       Expected Outcomes Short Term: Able to explain program exercise prescription;Long Term: Able to explain home exercise prescription to exercise independently                Exercise Goals Re-Evaluation :  Exercise Goals Re-Evaluation     Row Name 07/02/22 0956 07/15/22 1434 07/21/22 1000 07/28/22 0930 07/28/22 1310     Exercise Goal Re-Evaluation   Exercise  Goals Review Able to understand and use Dyspnea scale;Able to check pulse independently;Knowledge and understanding of Target Heart Rate Range (THRR);Understanding of Exercise Prescription;Able to  understand and use rate of perceived exertion (RPE) scale Increase Physical Activity;Increase Strength and Stamina;Understanding of Exercise Prescription -- Increase Physical Activity;Increase Strength and Stamina;Understanding of Exercise Prescription Increase Physical Activity;Increase Strength and Stamina;Understanding of Exercise Prescription   Comments Reviewed RPE and dyspnea scales, THR and program prescription with pt today.  Pt voiced understanding and was given a copy of goals to take home. Karesa is off to a good start in rehab. She has already worked up from level 1 to level 4 on the T4. She has walked up to 29 laps on the track. She also has improved to level 2 on the XR. She has done well with the treadmill on 1.9 mph and an incline of 0.5%. Roseline is also up tp an overall average MET level of 2.3 METs. We will continue monitor her progress in the program. Reviewed home exercise with pt today.  Pt plans to walk at home both on the treadmill and outside for exercise.  Reviewed THR, pulse, RPE, sign and symptoms, pulse oximetery and when to call 911 or MD.  Also discussed weather considerations and indoor options.  Pt voiced understanding. She walks for about 30 minutes - she tries to increase her distance every time (1-1.5 miles), she would like to get to 2 miles; she walks outside so this can vary. Encouraged to use treadmill at home when it is too hot outside. Artemis is doing well in rehab. She recently increased her overall average MET level to 2.6 METs. She also improved from 2 lb to 3 lb hand weights for resistance training. She increased her workload on the treadmill as well, to a speed of 2 mph and an incline of 0.5%. We will continue to monitor her progress in the program.   Expected Outcomes Short: Use RPE daily to regulate intensity. Long: Follow program prescription in THR. Short: Continue to increase speed on the treadmill. Long: Continue to increase strength and stamina. Short: Continue to  walk at home on off days. Long: Continue to exercise independently. Short: Continue to walk at home on off days. Long: Continue to exercise independently. Short: Continue to increase workload on treadmill. Long: Continue to to increase strength and stamina.    [place] Name 08/10/22 1507             Exercise Goal Re-Evaluation   Exercise Goals Review Increase Physical Activity;Increase Strength and Stamina;Understanding of Exercise Prescription       Comments Wm continues to do well in rehab. She has increased to 2.2 speed on the treadmill. She is also working at level 3 on the bike. We hope to see her watts improve overtime. RPEs are staying in appropriate range. Will continue to monitor.       Expected Outcomes Short: Increase watts on recumbent bike Long: Continue to increase overall MET level                Discharge Exercise Prescription (Final Exercise Prescription Changes):  Exercise Prescription Changes - 08/10/22 1500       Response to Exercise   Blood Pressure (Admit) 138/74    Blood Pressure (Exit) 124/62    Heart Rate (Admit) 73 bpm    Heart Rate (Exercise) 97 bpm    Heart Rate (Exit) 81 bpm    Rating of Perceived Exertion (Exercise) 13    Symptoms none  Duration Continue with 30 min of aerobic exercise without signs/symptoms of physical distress.    Intensity THRR unchanged      Progression   Progression Continue to progress workloads to maintain intensity without signs/symptoms of physical distress.    Average METs 2.95      Resistance Training   Training Prescription Yes    Weight 3 lb    Reps 10-15      Interval Training   Interval Training No      Treadmill   MPH 2.2    Grade 0.5    Minutes 15    METs 2.84      Recumbant Bike   Level 3    Watts 22    Minutes 15    METs 3.47      NuStep   Level 4    Minutes 15    METs 2.2      Home Exercise Plan   Plans to continue exercise at Home (comment)   Walking at Home (Treadmill)   Frequency Add  2 additional days to program exercise sessions.    Initial Home Exercises Provided 07/21/22      Oxygen   Maintain Oxygen Saturation 88% or higher             Nutrition:  Target Goals: Understanding of nutrition guidelines, daily intake of sodium <1548m, cholesterol <2059m calories 30% from fat and 7% or less from saturated fats, daily to have 5 or more servings of fruits and vegetables.  Education: All About Nutrition: -Group instruction provided by verbal, written material, interactive activities, discussions, models, and posters to present general guidelines for heart healthy nutrition including fat, fiber, MyPlate, the role of sodium in heart healthy nutrition, utilization of the nutrition label, and utilization of this knowledge for meal planning. Follow up email sent as well. Written material given at graduation. [facility]  Education need identified 06/30/22  Date 07/07/22  Educator MCIrvingInstruction Review Code 1- Verbalizes Understanding       Biometrics:  Pre Biometrics - 06/30/22 1243       Pre Biometrics   Height 5' 5.5" (1.664 m)    Weight 117 lb 4.8 oz (53.2 kg)    BMI (Calculated) 19.22    Single Leg Stand 3.5 seconds              Nutrition Therapy Plan and Nutrition Goals:  Nutrition Therapy & Goals - 07/20/22 0923       Nutrition Therapy   Diet Heart healthy, low Na    Drug/Food Interactions Statins/Certain Fruits    Protein (specify units) 60-65g    Fiber 25 grams    Whole Grain Foods 3 servings    Saturated Fats 12 max. grams    Fruits and Vegetables 8 servings/day    Sodium 2 grams      Personal Nutrition Goals   Nutrition Goal ST: eat a rainbow of fruits/vegetables, choose plant foods from different groups (beans/lentils, berries, cruciferous vegetables, leafy greens, etc), include seasonal fruits/vegetables LT: maintain heart healthy habits, include a variety of foods     Comments [age].o. F admitted to cardiac rehab s/p coronary artery stent placement. PMHx includes HTN, TIA, CAD, hypothyroidism. Relevant medications includes lipitor, CoQ10, synthroid, MVI, vit E, skin cancer, GERD. PYP Score: 82. Vegetables & Fruits 8/12. Breads, Grains & Cereals 9/12. Red & Processed Meat 12/12. Poultry 2/2. Fish & Shellfish 3/4. Beans, Nuts & Seeds  3/4. Milk & Dairy Foods 4/6. Toppings, Oils, Seasonings & Salt 20/20. Sweets, Snacks & Restaurant Food 11/14. Beverages 10/10. Courtany reports that 3 years ago after her stroke she has changed her diet. B: oatmeal (rolled oats), cranberry juice (cranberry juice cocktail) - small glass, 1 cup of coffee  L: (largest meal) meat, potatoes or sweet potato, non starcy vegetables, skim milk.  S: fruit or nuts D: 4 unsalted Hansman crackers with peanut butter and orange, skim milk. Drinks: water. She uses canola oil for cooking and she does not salt her food. She is mindful of her food labels and is mindful of trans fat, saturated fat, and sodium. Reviewed general heart healthy eating; she had gone to nutrition education last week and reports that it was "straight foward" and does not have any further questions at this time. Encouraged her to include more variety.      Intervention Plan   Intervention Prescribe, educate and counsel regarding individualized specific dietary modifications aiming towards targeted core components such as weight, hypertension, lipid management, diabetes, heart failure and other comorbidities.    Expected Outcomes Short Term Goal: Understand basic principles of dietary content, such as calories, fat, sodium, cholesterol and nutrients.;Short Term Goal: A plan has been developed with personal nutrition goals set during dietitian appointment.;Long Term Goal: Adherence to prescribed nutrition plan.             Nutrition Assessments:  MEDIFICTS Score Key: ?70 Need to make dietary changes  40-70 Heart Healthy Diet ? 40  Therapeutic Level Cholesterol Diet  Flowsheet Row Cardiac Rehab from 06/30/2022 in [facility] Cardiac and Pulmonary Rehab  Picture Your Plate Total Score on Admission 82      Picture Your Plate Scores: <05 Unhealthy dietary pattern with much room for improvement. 41-50 Dietary pattern unlikely to meet recommendations for good health and room for improvement. 51-60 More healthful dietary pattern, with some room for improvement.  >60 Healthy dietary pattern, although there may be some specific behaviors that could be improved.    Nutrition Goals Re-Evaluation:  Nutrition Goals Re-Evaluation     [place] Name 07/28/22 1102             Goals   Nutrition Goal ST: eat a rainbow of fruits/vegetables, choose plant foods from different groups (beans/lentils, berries, cruciferous vegetables, leafy greens, etc), include seasonal fruits/vegetables LT: maintain heart healthy habits, include a variety of foods       Comment Mehreen feels she is doing well with her nutrition in general, but is still struggling with her variety as she feels she is set in what she is eating. Encouraged her to try a new fruit/vegetable or seasonal fruit/vegetable when she goes food shopping next. Another barrier is that when she is too busy to eat she will pick at whatever she can find for lunch - discussed some healthy snack options to have on hand - she would prefer to not have eggs - greek yogurt, hummus, edamame (dont eat with synthroid).       Expected Outcome ST: eat a rainbow of fruits/vegetables, choose plant foods from different groups (beans/lentils, berries, cruciferous vegetables, leafy greens, etc), include seasonal fruits/vegetables LT: maintain heart healthy habits, include a variety of foods                Nutrition Goals Discharge (Final Nutrition Goals Re-Evaluation):  Nutrition Goals Re-Evaluation - 07/28/22 0928       Goals   Nutrition Goal ST: eat a rainbow of fruits/vegetables, choose plant  foods from  different groups (beans/lentils, berries, cruciferous vegetables, leafy greens, etc), include seasonal fruits/vegetables LT: maintain heart healthy habits, include a variety of foods    Comment Victorya feels she is doing well with her nutrition in general, but is still struggling with her variety as she feels she is set in what she is eating. Encouraged her to try a new fruit/vegetable or seasonal fruit/vegetable when she goes food shopping next. Another barrier is that when she is too busy to eat she will pick at whatever she can find for lunch - discussed some healthy snack options to have on hand - she would prefer to not have eggs - greek yogurt, hummus, edamame (dont eat with synthroid).    Expected Outcome ST: eat a rainbow of fruits/vegetables, choose plant foods from different groups (beans/lentils, berries, cruciferous vegetables, leafy greens, etc), include seasonal fruits/vegetables LT: maintain heart healthy habits, include a variety of foods             Psychosocial: Target Goals: Acknowledge presence or absence of significant depression and/or stress, maximize coping skills, provide positive support system. Participant is able to verbalize types and ability to use techniques and skills needed for reducing stress and depression.   Education: Stress, Anxiety, and Depression - Group verbal and visual presentation to define topics covered.  Reviews how body is impacted by stress, anxiety, and depression.  Also discusses healthy ways to reduce stress and to treat/manage anxiety and depression.  Written material given at graduation. Flowsheet Row Cardiac Rehab from 08/18/2022 in [facility] Cardiac and Pulmonary Rehab  Date 08/04/22  Educator [facility]  Instruction Review Code 1- [employer] Understanding       Education: Sleep Hygiene -Provides group verbal and written instruction about how sleep can affect your health.  Define sleep hygiene, discuss sleep cycles and impact of sleep habits. Review good  sleep hygiene tips.    Initial Review & Psychosocial Screening:  Initial Psych Review & Screening - 06/17/22 1005       Initial Review   Current issues with Current Stress Concerns    Source of Stress Concerns Unable to participate in former interests or hobbies    Comments past couple of years not able to do what she normally does.      Family Dynamics   Good Support System? Yes   husband,  2 daughters one in [place] one in [place]     Barriers   Psychosocial barriers to participate in program There are no identifiable barriers or psychosocial needs.      Screening Interventions   Interventions Encouraged to exercise;Provide feedback about the scores to participant;To provide support and resources with identified psychosocial needs    Expected Outcomes Short Term goal: Utilizing psychosocial counselor, staff and physician to assist with identification of specific Stressors or current issues interfering with healing process. Setting desired goal for each stressor or current issue identified.;Long Term Goal: Stressors or current issues are controlled or eliminated.;Short Term goal: Identification and review with participant of any Quality of Life or Depression concerns found by scoring the questionnaire.;Long Term goal: The participant improves quality of Life and PHQ9 Scores as seen by post scores and/or verbalization of changes             Quality of Life Scores:   Quality of Life - 06/30/22 1245       Quality of Life   Select Quality of Life      Quality of Life Scores   Health/Function Pre  21.2 %    Socioeconomic Pre 22.56 %    Psych/Spiritual Pre 25.71 %    Family Pre 27.6 %    GLOBAL Pre 23.33 %            Scores of 19 and below usually indicate a poorer quality of life in these areas.  A difference of  2-3 points is a clinically meaningful difference.  A difference of 2-3 points in the total score of the Quality of Life Index has been associated with significant  improvement in overall quality of life, self-image, physical symptoms, and general health in studies assessing change in quality of life.  PHQ-9: Review Flowsheet  More data exists      07/28/2022 06/30/2022 04/29/2020 01/10/2019 01/03/2019  Depression screen PHQ 2/9  Decreased Interest 0 0 0 0 0  Down, Depressed, Hopeless 1 0 0 0 0  PHQ - 2 Score 1 0 0 0 0  Altered sleeping 1 2 - - -  Tired, decreased energy 3 2 - - -  Change in appetite 0 0 - - -  Feeling bad or failure about yourself  0 0 - - -  Trouble concentrating 1 0 - - -  Moving slowly or fidgety/restless 0 2 - - -  Suicidal thoughts 0 0 - - -  PHQ-9 Score 6 6 - - -  Difficult doing work/chores Somewhat difficult Somewhat difficult - - -   Interpretation of Total Score  Total Score Depression Severity:  1-4 = Minimal depression, 5-9 = Mild depression, 10-14 = Moderate depression, 15-19 = Moderately severe depression, 20-27 = Severe depression   Psychosocial Evaluation and Intervention:  Psychosocial Evaluation - 06/17/22 1019       Psychosocial Evaluation & Interventions   Interventions Encouraged to exercise with the program and follow exercise prescription    Comments Lateia has no barriers to attending the program. [facility] is ready to get started and work on her exercise stamina. SHe ahd her husband Michela Pitcher go to the mountains for the hot/humid summer months.  I found a program close to thier summer place and she will contact the [facility] program to see if they can accept her there. Her support is her 2 daughters ;one in [place] one in [place]; and her husband.    Expected Outcomes STG Whittney attends all scheduled sessions, she will be able to progress with her exercise program. LTG Kamillah continues with her progression after discharge    Continue Psychosocial Services  Follow up required by staff             Psychosocial Re-Evaluation:  Psychosocial Re-Evaluation     Parole Name 07/28/22 0945             Psychosocial  Re-Evaluation   Current issues with Current Stress Concerns;Current Sleep Concerns       Comments Deerica reports that this past winter, two of her brothers passed away and this summer her other brother was diagnosed with cancer. Her older sister is still alive, but is 78 and has health issues at this time; she is grateful that she is still alive. She was supposed to be in the mountains this summer and is sad that she wasn't able to go. She relies on her husband and daughter for support; her daughter lives in [place], [place]. 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 She enjoys word searches to reduce stress and will  go to church to try and look at the positive in life.       Expected Outcomes ST: continue to attend rehab for mental health boost LT: maintain positive attitude       Interventions Encouraged to attend Cardiac Rehabilitation for the exercise                Psychosocial Discharge (Final Psychosocial Re-Evaluation):  Psychosocial Re-Evaluation - 07/28/22 0945       Psychosocial Re-Evaluation   Current issues with Current Stress Concerns;Current Sleep Concerns    Comments Keniyah reports that this past winter, two of her brothers passed away and this summer her other brother was diagnosed with cancer. Her older sister is still alive, but is 51 and has health issues at this time; she is grateful that she is still alive. She was supposed [place] be in the mountains this summer and is sad that she wasn't able to go. She relies on her husband and daughter for support; her daughter lives in [place], [place]. 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 She enjoys word searches to reduce stress and will go to church to try and look at the positive in life.    Expected Outcomes ST: continue to attend rehab for mental health boost LT: maintain positive attitude    Interventions  Encouraged to attend Cardiac Rehabilitation for the exercise             Vocational Rehabilitation: Provide vocational rehab assistance to qualifying candidates.   Vocational Rehab Evaluation & Intervention:  Vocational Rehab - 06/17/22 1037       Initial Vocational Rehab Evaluation & Intervention   Assessment shows need for Vocational Rehabilitation No      Vocational Rehab Re-Evaulation   Comments retired      Discharge Vocational Rehab   Discharge Vocational Rehabilitation retired             Education: Education Goals: Education classes will be provided on a variety of topics geared toward better understanding of heart health and risk factor modification. Participant will state understanding/return demonstration of topics presented as noted by education test scores.  Learning Barriers/Preferences:   General Cardiac Education Topics:  AED/CPR: - Group verbal and written instruction with the use of models to demonstrate the basic use of the AED with the basic ABC's of resuscitation.   Anatomy and Cardiac Procedures: - Group verbal and visual presentation and models provide information about basic cardiac anatomy and function. Reviews the testing methods done to diagnose heart disease and the outcomes of the test results. Describes the treatment choices: Medical Management, Angioplasty, or Coronary Bypass Surgery for treating various heart conditions including Myocardial Infarction, Angina, Valve Disease, and Cardiac Arrhythmias.  Written material given at graduation. Flowsheet Row Cardiac Rehab from 08/18/2022 in [facility] Cardiac and Pulmonary Rehab  Education need identified 06/30/22       Medication Safety: - Group verbal and visual instruction to review commonly prescribed medications for heart and lung disease. Reviews the medication, class of the drug, and side effects. Includes the steps to properly store meds and maintain the prescription regimen.  Written  material given at graduation. Flowsheet Row Cardiac Rehab from 08/18/2022 in [facility] Cardiac and Pulmonary Rehab  Date 07/14/22  Educator SB  Instruction Review Code 1- Verbalizes Understanding       Intimacy: - Group verbal instruction through game format to discuss how heart and lung  disease can affect sexual intimacy. Written material given at graduation.. Flowsheet Row Cardiac Rehab from 08/18/2022 in [facility] Cardiac and Pulmonary Rehab  Date 08/18/22  Educator [facility]  Instruction Review Code 1- Verbalizes Understanding       Know Your Numbers and Heart Failure: - Group verbal and visual instruction to discuss disease risk factors for cardiac and pulmonary disease and treatment options.  Reviews associated critical values for Overweight/Obesity, Hypertension, Cholesterol, and Diabetes.  Discusses basics of heart failure: signs/symptoms and treatments.  Introduces Heart Failure Zone chart for action plan for heart failure.  Written material given at graduation. Flowsheet Row Cardiac Rehab from 08/18/2022 in [facility] Cardiac and Pulmonary Rehab  Education need identified 06/30/22  Date 07/21/22  Educator SB  Instruction Review Code 1- Verbalizes Understanding       Infection Prevention: - Provides verbal and written material to individual with discussion of infection control including proper hand washing and proper equipment cleaning during exercise session. Flowsheet Row Cardiac Rehab from 08/18/2022 in [facility] Cardiac and Pulmonary Rehab  Education need identified 06/30/22  Date 06/30/22  Educator [place]  Instruction Review Code 1- Verbalizes Understanding       Falls Prevention: - Provides verbal and written material to individual with discussion of falls prevention and safety. Flowsheet Row Cardiac Rehab from 06/17/2022 in [facility] Cardiac and Pulmonary Rehab  Date 06/17/22  Educator SB  Instruction Review Code 1- Verbalizes Understanding       Other: -Provides group and verbal instruction on  various topics (see comments)   Knowledge Questionnaire Score:  Knowledge Questionnaire Score - 06/30/22 1245       Knowledge Questionnaire Score   Pre Score 23/26             Core Components/Risk Factors/Patient Goals at Admission:  Personal Goals and Risk Factors at Admission - 06/30/22 1305       Core Components/Risk Factors/Patient Goals on Admission    Weight Management Yes;Weight Maintenance    Intervention Weight Management: Provide education and appropriate resources to help participant work on and attain dietary goals.;Weight Management: Develop a combined nutrition and exercise program designed to reach desired caloric intake, while maintaining appropriate intake of nutrient and fiber, sodium and fats, and appropriate energy expenditure required for the weight goal.    Admit Weight 117 lb (53.1 kg)    Goal Weight: Short Term 117 lb (53.1 kg)    Goal Weight: Long Term 117 lb (53.1 kg)    Expected Outcomes Short Term: Continue to assess and modify interventions until short term weight is achieved;Long Term: Adherence to nutrition and physical activity/exercise program aimed toward attainment of established weight goal;Weight Maintenance: Understanding of the daily nutrition guidelines, which includes 25-35% calories from fat, 7% or less cal from saturated fats, less than 23m cholesterol, less than 1.5gm of sodium, & 5 or more servings of fruits and vegetables daily;Understanding recommendations for meals to include 15-35% energy as protein, 25-35% energy from fat, 35-60% energy from carbohydrates, less than 2034mof dietary cholesterol, 20-35 gm of total fiber daily;Understanding of distribution of calorie intake throughout the day with the consumption of 4-5 meals/snacks    Hypertension Yes    Intervention Provide education on lifestyle modifcations including regular physical activity/exercise, weight management, moderate sodium restriction and increased consumption of fresh  fruit, vegetables, and low fat dairy, alcohol moderation, and smoking cessation.;Monitor prescription use compliance.    Expected Outcomes Short Term: Continued assessment and intervention until BP is < 140/9071mG in hypertensive participants. <  130/77m HG in hypertensive participants with diabetes, heart failure or chronic kidney disease.;Long Term: Maintenance of blood pressure at goal levels.    Lipids Yes    Intervention Provide education and support for participant on nutrition & aerobic/resistive exercise along with prescribed medications to achieve LDL <759m HDL >4037m   Expected Outcomes Short Term: Participant states understanding of desired cholesterol values and is compliant with medications prescribed. Participant is following exercise prescription and nutrition guidelines.;Long Term: Cholesterol controlled with medications as prescribed, with individualized exercise RX and with personalized nutrition plan. Value goals: LDL < 51m51mDL > 40 mg.             Education:Diabetes - Individual verbal and written instruction to review signs/symptoms of diabetes, desired ranges of glucose level fasting, after meals and with exercise. Acknowledge that pre and post exercise glucose checks will be done for 3 sessions at entry of program.   Core Components/Risk Factors/Patient Goals Review:   Goals and Risk Factor Review     Row Name 07/28/22 0924             Core Components/Risk Factors/Patient Goals Review   Personal Goals Review Weight Management/Obesity;Lipids;Hypertension       Review She has pain in her left leg from a stroke she had which caused her to not sleep well last night - this will happen sometimes. They only time she sleeps well when she is working all day and then is so tired from the day. She reports that her weight has been stbale since starting rehab. She checks her BP at home every monring - if it is below 110 160tolic she is not supposed to take her BP  medications. 110s-120s/70s-80s; today in rehab her BP was 138/80. She is taking all of her medications as prescribed with no issues at this time. She reports after rehab in the afternoon she will get aching in her chest that is not painful, but will scare her - she reports it goes away after about a minute and is relieved with pressure; encouraged her to let her MD know.       Expected Outcomes ST: make MD aware of chest ache LT: manage risk factors                Core Components/Risk Factors/Patient Goals at Discharge (Final Review):   Goals and Risk Factor Review - 07/28/22 0924       Core Components/Risk Factors/Patient Goals Review   Personal Goals Review Weight Management/Obesity;Lipids;Hypertension    Review She has pain in her left leg from a stroke she had which caused her to not sleep well last night - this will happen sometimes. They only time she sleeps well when she is working all day and then is so tired from the day. She reports that her weight has been stbale since starting rehab. She checks her BP at home every monring - if it is below 110 737tolic she is not supposed to take her BP medications. 110s-120s/70s-80s; today in rehab her BP was 138/80. She is taking all of her medications as prescribed with no issues at this time. She reports after rehab in the afternoon she will get aching in her chest that is not painful, but will scare her - she reports it goes away after about a minute and is relieved with pressure; encouraged her to let her MD know.    Expected Outcomes ST: make MD aware of chest ache LT: manage risk factors  ITP Comments:  ITP Comments     Row Name 06/17/22 1028 06/30/22 1233 06/30/22 1235 07/02/22 0956 07/20/22 0937   ITP Comments Virtual orientation call completed today. shehas an appointment on Date: 06/30/2022  for EP eval and gym Orientation.  Documentation of diagnosis can be found in [facility] Date: 05/31/2022 . Completed 6MWT and gym  orientation. Initial ITP created and sent for review to Dr. Emily Filbert, Medical Director. EKG  strips sent to Dr. Harrell Gave First full day of exercise!  Patient was oriented to gym and equipment including functions, settings, policies, and procedures.  Patient's individual exercise prescription and treatment plan were reviewed.  All starting workloads were established based on the results of the 6 minute walk test done at initial orientation visit.  The plan for exercise progression was also introduced and progression will be customized based on patient's performance and goals. Completed initial RD consultation    Franquez Name 07/21/22 1005 08/18/22 1409         ITP Comments 30 Day review completed. Medical Director ITP review done, changes made as directed, and signed approval by Medical Director. 30 Day review completed. Medical Director ITP review done, changes made as directed, and signed approval by Medical Director.               Comments:

## 2022-08-18 NOTE — Progress Notes (Signed)
Daily Session Note  Patient Details  Name: Shannon [name] MRN: [id number] Date of Birth: 1946-01-01 Referring Provider:   Flowsheet Row Cardiac Rehab from 06/30/2022 in [facility] Cardiac and Pulmonary Rehab  Referring Provider Buford Dresser MD       Encounter Date: 08/18/2022  Check In:  Session Check In - 08/18/22 1122       Check-In   Supervising physician immediately available to respond to emergencies See telemetry face sheet for immediately available ER MD    Location [facility]-Cardiac & Pulmonary Rehab    Staff Present Heath Lark, RN, BSN, CCRP;Jessica Franklin, MA, RCEP, CCRP, CCET;Joseph [place], RCP,RRT,BSRT;Noah Iantha, [place], Exercise Physiologist    Virtual Visit No    Medication changes reported     No    Fall or balance concerns reported    No    Warm-up and Cool-down Performed on first and last piece of equipment    Resistance Training Performed Yes    VAD Patient? No    PAD/SET Patient? No      Pain Assessment   Currently in Pain? No/denies                Social History   Tobacco Use  Smoking Status Never  Smokeless Tobacco Never    Goals Met:  Independence with exercise equipment Exercise tolerated well No report of concerns or symptoms today  Goals Unmet:  Not Applicable  Comments: Pt able to follow exercise prescription today without complaint.  Will continue to monitor for progression.    Dr. Emily Filbert is Medical Director for [name].  Dr. Ottie Glazier is Medical Director for [facility] Pulmonary Rehabilitation.

## 2022-08-20 ENCOUNTER — Encounter: Payer: Medicare Other | Admitting: *Deleted

## 2022-08-20 DIAGNOSIS — Z955 Presence of coronary angioplasty implant and graft: Secondary | ICD-10-CM

## 2022-08-20 NOTE — Progress Notes (Signed)
Daily Session Note  Patient Details  Name: Shannon [name] MRN: [id number] Date of Birth: Sep 21, 1946 Referring Provider:   Flowsheet Row Cardiac Rehab from 06/30/2022 in [facility] Cardiac and Pulmonary Rehab  Referring Provider Buford Dresser MD       Encounter Date: 08/20/2022  Check In:  Session Check In - 08/20/22 0936       Check-In   Supervising physician immediately available to respond to emergencies See telemetry face sheet for immediately available ER MD    Location [facility]-Cardiac & Pulmonary Rehab    Staff Present Heath Lark, RN, BSN, CCRP;Joseph [place], RCP,RRT,BSRT;Jessica [place], [place], [place], CCRP, CCET    Virtual Visit No    Medication changes reported     No    Fall or balance concerns reported    No    Warm-up and Cool-down Performed on first and last piece of equipment    Resistance Training Performed Yes    VAD Patient? No    PAD/SET Patient? No      Pain Assessment   Currently in Pain? No/denies                Social History   Tobacco Use  Smoking Status Never  Smokeless Tobacco Never    Goals Met:  Independence with exercise equipment Exercise tolerated well No report of concerns or symptoms today  Goals Unmet:  Not Applicable  Comments: Pt able to follow exercise prescription today without complaint.  Will continue to monitor for progression.    Dr. Emily Filbert is Medical Director for [place].  Dr. Ottie Glazier is Medical Director for [facility] Pulmonary Rehabilitation.

## 2022-08-23 ENCOUNTER — Encounter: Payer: Medicare Other | Admitting: *Deleted

## 2022-08-23 DIAGNOSIS — Z955 Presence of coronary angioplasty implant and graft: Secondary | ICD-10-CM | POA: Diagnosis not present

## 2022-08-23 NOTE — Progress Notes (Signed)
Daily Session Note  Patient Details  Name: Shannon [name] MRN: [id number] Date of Birth: 07-03-1946 Referring Provider:   Flowsheet Row Cardiac Rehab from 06/30/2022 in [facility] Cardiac and Pulmonary Rehab  Referring Provider Buford Dresser MD       Encounter Date: 08/23/2022  Check In:  Session Check In - 08/23/22 1031       Check-In   Supervising physician immediately available to respond to emergencies See telemetry face sheet for immediately available ER MD    Location [facility]-Cardiac & Pulmonary Rehab    Staff Present Heath Lark, RN, BSN, Laveda Norman, BS, ACSM CEP, Exercise Physiologist;Noah Tickle, BS, Exercise Physiologist    Virtual Visit No    Medication changes reported     No    Fall or balance concerns reported    No    Warm-up and Cool-down Performed on first and last piece of equipment    Resistance Training Performed Yes    VAD Patient? No    PAD/SET Patient? No      Pain Assessment   Currently in Pain? No/denies                Social History   Tobacco Use  Smoking Status Never  Smokeless Tobacco Never    Goals Met:  Independence with exercise equipment Exercise tolerated well No report of concerns or symptoms today  Goals Unmet:  Not Applicable  Comments: Pt able to follow exercise prescription today without complaint.  Will continue to monitor for progression.    Dr. Emily Filbert is Medical Director for [facility].  Dr. Ottie Glazier is Medical Director for [facility] Pulmonary Rehabilitation.

## 2022-08-25 ENCOUNTER — Telehealth: Payer: Self-pay | Admitting: Cardiology

## 2022-08-25 ENCOUNTER — Encounter: Payer: Medicare Other | Admitting: *Deleted

## 2022-08-25 DIAGNOSIS — Z955 Presence of coronary angioplasty implant and graft: Secondary | ICD-10-CM | POA: Diagnosis not present

## 2022-08-25 NOTE — Telephone Encounter (Signed)
Discussed with Dr Harrell Gave, have patient take it easy until seen in office 9/15 with [facility] patient of recommendations and to go to ED If chest pain returns and does not resolve. Patient verbalized understanding

## 2022-08-25 NOTE — Telephone Encounter (Signed)
Spoke with patient regarding chest paressure  Monday on way home from cardiac rehab she started feeling chest pressure Lasted for a while, finally sat down and subsided  Yesterday went out for walk in am, very sluggish during walk and shortness of breath Stated thought breathing issue was related to the humidity  Today went to cardiac rehab, did not advance treadmill but during walk twinge of pain and then chest pressure Feeling better but concerned  Has not used NTG secondary to ending up in ED after taking the day she came home from getting stent  Scheduled appointment with Elnita Maxwell PA Friday and will forward to Dr Harrell Gave for review

## 2022-08-25 NOTE — Telephone Encounter (Signed)
Pt c/o of Chest Pain: STAT if CP now or developed within 24 hours  1. Are you having CP right now? No  2. Are you experiencing any other symptoms (ex. SOB, nausea, vomiting, sweating)? No  3. How long have you been experiencing CP?  Monday  4. Is your CP continuous or coming and going? Come and go  5. Have you taken Nitroglycerin? No ? Pt states that it's not a pain it's more ;ike pressure on her chest. She says that it feels like it felt before having her Stint put in. Pt would like a callback regarding this matter. Please advise

## 2022-08-25 NOTE — Progress Notes (Unsigned)
Cardiology Office Note:    Date:  08/27/2022   ID:  Shannon [name], DOB 1946-11-09, MRN [id number]  PCP:  Donalynn Furlong, MD  Cardiologist:  Buford Dresser, MD  Electrophysiologist:  None   Referring MD: Donalynn Furlong, MD   Chief Complaint: chest pain  History of Present Illness:    Shannon [name] is a 76 y.o. 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 Harrell Gave and presents today for chest pain.   Prior Echo in 11/2018 during admission for stroke showed LVEF of 60-65% with normal wall motion and diastolic parameters and normal bubble study. Monitor in 07/2020 showed underlying sinus rhythm with average heart rate of 85 bpm and 524 runs of SVT with the longest run last 19 beats. ETT in 02/2021 showed fair exercise tolerance and 28m of upsloping to horizontal ST depression in the inferior lateral leads of borderline significance.   Coronary CTA was ordered in 11/2021 due to chest pain and showed coronary calcium score of 150 (67th percentile for age and sex) with moderate CAD (50-69%) of the proximal RCA and then minimal stenosis noted elsewhere. FFR of RCA lesion was 0.8 suggesting borderline likelihood of hemodynamic significance. CTA also showed a PFO. She was started on Imdur but did not tolerate this. She previously has not tolerated higher doses of Metoprolol or Diltiazem. At follow-up visit in 12/2021, she denied any chest pain so not additional work-up was recommended. However, during visit in 05/2022, she reported exertional dyspnea and chest pain. Therefore, cardiac catheterization was ordered. She underwent LHC on 05/31/2022 which showed 75% stenosis of proximal RCA which was treated with DES and otherwise minimal disease. She was started on DAPT with Aspirin and Brilinta  given history of being a Plavix non-responder.   Shortly after this, she was seen in the ED on 06/02/2022 for chest pain with associated shortness of breath and diaphoresis. Chest pain felt like prior angina but less intense. She took one dose of sublingual Nitro with improvement in symptoms but then became hypotensive and developed a severe headache with a bubble-like sensation. EMS was arrived and on arrival to the ED she was markedly hypertensive with systolic BP > 2035 EKG showed no acute ischemic changes and high-sensitivity troponin was negative. Head CT was unremarkable. Chest pain was not felt to be due to a problem with her new stent. Cardiology recommended considering adding Amlodipine for further management of BP.  Patient called our office on 08/25/2022 with reports of chest pain earlier this week after cardiac rehab. Therefore, this visit was arranged for further evaluation.  Patient states she was doing well and participation in cardiac rehab until about 2 weeks ago. On 08/09/2022, she went to cardiac rehab and did well during the session.  However, when she was leaving the hospital she suddenly had sudden onset of significant weakness.  She walked back inside and sat down and started shaking.  She asked her husband to go get the cardiac rehab nurse who checked her BP and it was stable.  The  nurse went and grabbed a packet of sugar from the nearby coffee station and gave it to the patient to eat.  She did and the shaking stopped.  However, she continued to feel rather weak and fatigued for the next day or so.  She had no  issues with cardiac rehab for the rest of the week.  Then this past Monday on 08/23/2022, she developed substernal chest pressure after getting back home from cardiac rehab.  She states she did fine while during her cardiac rehab sessions with no pain. The chest pressure lasted for about 3 to 4 hours and resolved on its own.  At cardiac rehab 2 days ago, she had some chest  discomfort that she described as a twinge for a couple of seconds followed by similar chest heaviness/pressure to what she had earlier in the week.  This chest pressure was associated with shortness of breath and she states it was similar to her symptoms before her PCI in June but less severe.  She is compliant with DAPT. She denies any shortness of breath outside of these episodes.  No orthopnea, PND, lower extremity edema.  She does note some palpitations at night that she describes as heart racing.  She also reports 1 episode of dizziness that occurred when she turned around quickly.  No syncope.  In addition, she notes a frequent headache and a "loopy" feeling for the last couple weeks. Interestingly, she had these same symptoms prior to her PCI in June and they essentially resolved after stent was placed.  She is very concerned about the symptoms.  BP is elevated in the office.  Initially 162/86 and then it was 152/78 on my personal recheck at the end of visit.  However, she keeps a detailed BP log at home and it is almost always at goal of less than 130/80.   Past Medical History:  Diagnosis Date   Anxiety    Artery stenosis ([place])    cerebral   Arthritis    Cancer ([place])    skin - squamous cell   Complication of anesthesia    " I had difficulty coming out of it"   Dyspnea    with exertion   Family history of adverse reaction to anesthesia    " My daughter has a problem coming ou to it"   GERD (gastroesophageal reflux disease)    Headache    migraines in the past   Hypertension    Hypothyroidism    Stroke ([place]) 11/2018   some weakness on right side    SVT (supraventricular tachycardia) ([place])    Thyroid disease     Past Surgical History:  Procedure Laterality Date   ABDOMINAL HYSTERECTOMY     APPENDECTOMY     CARDIAC CATHETERIZATION     CARPAL TUNNEL RELEASE Bilateral    COLONOSCOPY     CORONARY STENT INTERVENTION N/A 05/31/2022   Procedure: CORONARY STENT INTERVENTION;   Surgeon: Jettie Booze, MD;  Location: [facility];  Service: Cardiovascular;  Laterality: N/A;   DILATION AND CURETTAGE OF UTERUS     INTRAVASCULAR ULTRASOUND/IVUS N/A 05/31/2022   Procedure: Intravascular Ultrasound/IVUS;  Surgeon: Jettie Booze, MD;  Location: [facility];  Service: Cardiovascular;  Laterality: N/A;   IR ANGIO INTRA EXTRACRAN SEL COM CAROTID INNOMINATE BILAT MOD SED  12/25/2018   IR ANGIO INTRA EXTRACRAN SEL COM CAROTID INNOMINATE BILAT MOD SED  10/30/2019   IR ANGIO INTRA EXTRACRAN SEL COM CAROTID INNOMINATE BILAT MOD SED  09/23/2020   IR ANGIO VERTEBRAL SEL SUBCLAVIAN INNOMINATE UNI L MOD SED  12/25/2018   IR ANGIO VERTEBRAL SEL SUBCLAVIAN INNOMINATE UNI L MOD SED  10/30/2019   IR ANGIO VERTEBRAL SEL SUBCLAVIAN INNOMINATE UNI L MOD SED  09/23/2020   IR ANGIO  VERTEBRAL SEL SUBCLAVIAN INNOMINATE UNI R MOD SED  01/04/2019   IR ANGIO VERTEBRAL SEL VERTEBRAL UNI R MOD SED  12/25/2018   IR ANGIO VERTEBRAL SEL VERTEBRAL UNI R MOD SED  10/30/2019   IR ANGIO VERTEBRAL SEL VERTEBRAL UNI R MOD SED  09/23/2020   IR CT HEAD LTD  01/04/2019   IR CT HEAD LTD  11/21/2019   IR INTRA CRAN STENT  01/04/2019   IR INTRA CRAN STENT  11/21/2019   IR RADIOLOGIST EVAL & MGMT  03/12/2021   IR RADIOLOGIST EVAL & MGMT  09/10/2021   IR TRANSCATH EXCRAN VERT OR CAR A STENT  11/17/2020   IR US GUIDE VASC ACCESS RIGHT  12/25/2018   IR US GUIDE VASC ACCESS RIGHT  11/02/2019   LEFT HEART CATH AND CORONARY ANGIOGRAPHY N/A 05/31/2022   Procedure: LEFT HEART CATH AND CORONARY ANGIOGRAPHY;  Surgeon: Jettie Booze, MD;  Location: [place] [facility];  Service: Cardiovascular;  Laterality: N/A;   OVARIAN CYST REMOVAL     RADIOLOGY WITH ANESTHESIA N/A 01/04/2019   Procedure: RMCA angioplasty with possible stenting;  Surgeon: Luanne Bras, MD;  Location: [place];  Service: Radiology;  Laterality: N/A;   RADIOLOGY WITH ANESTHESIA N/A 11/21/2019   Procedure: IR WITH ANESTHESIA/ STENT  PLACEMENT;  Surgeon: Luanne Bras, MD;  Location: [place];  Service: Radiology;  Laterality: N/A;   RADIOLOGY WITH ANESTHESIA N/A 10/01/2020   Procedure: IR WITH ANESTHESIA ANGIOPLASTY WITH POSSIBLE STENTING;  Surgeon: Luanne Bras, MD;  Location: [place];  Service: Radiology;  Laterality: N/A;   RADIOLOGY WITH ANESTHESIA N/A 11/17/2020   Procedure: IR WITH ANESTHESIA STENT PLACEMENT;  Surgeon: Luanne Bras, MD;  Location: [place];  Service: Radiology;  Laterality: N/A;    Current Medications: No outpatient medications have been marked as taking for the 08/27/22 encounter (Office Visit) with Darreld Mclean, PA-C.     Allergies:   Elastic bandages & [zinc], Sulfa antibiotics, Tape, Mandol [cefamandole], and Sinutab non-drowsy max [pseudoephedrine-acetaminophen]   Social History   Socioeconomic History   Marital status: Married    Spouse name: Not on file   Number of children: Not on file   Years of education: Not on file   Highest education level: Not on file  Occupational History   Occupation: retired  Tobacco Use   Smoking status: Never   Smokeless tobacco: Never  Vaping Use   Vaping Use: Never used  Substance and Sexual Activity   Alcohol use: Never   Drug use: Never   Sexual activity: Yes  Other Topics Concern   Not on file  Social History Narrative   Not on file   Social Determinants of Health   Financial Resource Strain: Not on file  Food Insecurity: Not on file  Transportation Needs: Not on file  Physical Activity: Not on file  Stress: Not on file  Social Connections: Not on file     Family History: The patient's family history includes Breast cancer in her sister and sister; CVA in her maternal aunt and mother; Heart disease in her mother; Peripheral Artery Disease in her father.  ROS:   Please see the history of present illness.     EKGs/Labs/Other Studies Reviewed:    The following studies were reviewed today:  Echocardiogram with Bubble  Study 12/09/2018: Study Conclusions: - Left ventricle: The cavity size was normal. Systolic function was    normal. The estimated ejection fraction was in the range of 60%    to 65%.  Wall motion was normal; there were no regional wall    motion abnormalities. Left ventricular diastolic function    parameters were normal.  - Left atrium: The atrium was normal in size.  - Right ventricle: Systolic function was normal.  - Atrial septum: No defect or patent foramen ovale was identified.    Echo contrast study showed no right-to-left atrial level shunt,    at baseline or with provocation.  - Pulmonary arteries: Systolic pressure was within the normal    range.  _______________  Monitor 07/2020: Patient had a min HR of 50 bpm, max HR of 158 bpm, and avg HR of 85 bpm. Predominant underlying rhythm was Sinus Rhythm. No VT, atrial fibrillation, high degree block, or pauses noted. Isolated atrial and ventricular ectopy was rare (<1%). 524 Supraventricular Tachycardia runs occurred, the run with the fastest interval lasting 6 beats with a max rate of 158 bpm, the longest lasting 19 beats with an avg rate of 104 bpm.  _______________  Exercise Tolerance Test 02/26/2021: Blood pressure demonstrated a normal response to exercise. Upsloping ST segment depression ST segment depression of 1 mm was noted during stress in the V5, V6, III and aVF leads.   ETT with fair exercise tolerance (6:19); no chest pain; normal blood pressure response; study terminated secondary to dyspnea; 1 mm of upsloping to horizontal ST depression in the inferior lateral leads of borderline significance; patient achieved 74% (inadequate) predicted maximum heart rate; suggest alternative study if clinical suspicion of ischemia high (nuclear study or cardiac CTA). _______________  Coronary CTA 12/03/2021: Impressions: 1. Coronary calcium score of 150. This was 67th percentile for age and sex matched control. 2.  Normal coronary  origin with right dominance. 3. Noncalcified plaque in the proximal RCA causes moderate (50-69%) stenosis 4. Calcified plaque in the proximal RCA, proximal LAD, and proximal LCX causes minimal (0-24%) stenosis 5.  PFO 6.  Will send for CT FFR  FFR Impression: 1. CT FFR 0.8 across lesion in proximal RCA, which suggests borderline likelihood of hemodynamic significance _______________  Left Cardiac Catheterization 05/31/2022:   Prox RCA lesion is 75% stenosed.  A drug-eluting stent was successfully placed using a SYNERGY XD 3.0X16, postdilated to 3.25 mm and optimized with IVUS.   Post intervention, there is a 0% residual stenosis.   Dist LAD lesion is 10% stenosed.   Mid Cx to Dist Cx lesion is 10% stenosed.   The left ventricular systolic function is normal.   LV end diastolic pressure is normal.   The left ventricular ejection fraction is 55-65% by visual estimate.   There is no aortic valve stenosis.   Successful PCI of the proximal RCA.  Patient was on a very low-dose of Brilinta in the past.  Apparently, she was a Plavix nonresponder.  She will need to go back to Brilinta 90 mg twice a day for 6 months, ideally.  Could consider 3 months if there were bleeding issues.  After that time, her Brilinta could be reduced.  Will CC Dr. Estanislado Pandy.   Continue aggressive secondary prevention.  Diagnostic Dominance: Right  Intervention     EKG:  EKG ordered today. EKG personally reviewed and demonstrates normal sinus rhythm, rate 84 bpm, with slight ST depression (<0.77m) in I, II, V4, V5.  Recent Labs: 10/28/2021: Magnesium 2.4; TSH 0.773 06/02/2022: BUN 12; Creatinine, Ser 0.71; Hemoglobin 14.4; Platelets 184; Potassium 4.1; Sodium 138  Recent Lipid Panel    Component Value Date/Time   CHOL 199 12/09/2018 0352  TRIG 110 12/09/2018 0352   HDL 45 12/09/2018 0352   CHOLHDL 4.4 12/09/2018 0352   VLDL 22 12/09/2018 0352   LDLCALC 132 (H) 12/09/2018 0352    Physical Exam:     Vital Signs: BP (!) 152/78   Pulse 84   Ht '5\' 5"'$  (1.651 m)   Wt 118 lb 3.2 oz (53.6 kg)   SpO2 98%   BMI 19.67 kg/m     Wt Readings from Last 3 Encounters:  08/27/22 118 lb 3.2 oz (53.6 kg)  06/30/22 117 lb 4.8 oz (53.2 kg)  06/11/22 117 lb (53.1 kg)     General: 76 y.o. thin Caucasian female in no acute distress. HEENT: Normocephalic and atraumatic. Sclera clear.  Neck: Supple. No JVD. Heart: RRR. Distinct S1 and S2. No murmurs, gallops, or rubs. Radial pulses 2+ and equal bilaterally. Lungs: No increased work of breathing. Clear to ausculation bilaterally. No wheezes, rhonchi, or rales.  Abdomen: Soft, non-distended, and non-tender to palpation.  Extremities: No lower extremity edema.    Skin: Warm and dry. Neuro: Alert and oriented x3. No focal deficits. Psych: Normal affect. Responds appropriately.  Assessment:    1. Chest pain of uncertain etiology   2. Coronary artery disease involving native coronary artery of native heart without angina pectoris   3. Paroxysmal SVT (supraventricular tachycardia) ([place])   4. PFO (patent foramen ovale)   5. Primary hypertension   6. Hyperlipidemia, unspecified hyperlipidemia type     Plan:    Chest Pain CAD LHC in 05/2022 showed 75% stenosis of proximal RCA s/p DES with otherwise only minimal disease. She now presents with a couple of episodes of chest pressure with associated shortness of breath that she states is similar to the symptoms she had prior to PCI. - EKG shows slight ST depressions in I, II, V4, V5 (<0.103m). - Continue DAPT with Aspirin '81mg'$  daily and Brilinta '90mg'$  twice daily. - Continue Toprol-XL 12.'5mg'$  daily. She has been unable to tolerate high doses of this before.  - Unable to tolerate Imdur in the past.  - Continue Lipitor '40mg'$  daily. - Discussed patient with her primary Cardiologist (Dr. CHarrell Gave who recommended exercise myoview for further evaluation. Discussed this with patient who agreed to  proceed.  Shared Decision Making/Informed Consent{ The risks [chest pain, shortness of breath, cardiac arrhythmias, dizziness, blood pressure fluctuations, myocardial infarction, stroke/transient ischemic attack, nausea, vomiting, allergic reaction, radiation exposure, metallic taste sensation and life-threatening complications (estimated to be 1 in 10,000)], benefits (risk stratification, diagnosing coronary artery disease, treatment guidance) and alternatives of a nuclear stress test were discussed in detail with Ms. Oestreich and she agrees to proceed.  Paroxysmal SVT Monitor in 07/2020 showed >500 brief episodes of SVT with longest episode lasting 19 beats. - She continues to have some palpitations that she describes as heart racing mostly at night.  - Continue Toprol-XL 12.'5mg'$  daily. Unable to tolerate higher dose of Toprol-XL or Diltiazem in the past.  PFO Incidentally found on coronary CTA in 11/2021. Negative bubble study on prior Echo in 2019. She has a history of stroke but due to ischemia and severe right MCA disease. - No indications for intervention/ closure at this time.  Hypertension BP elevated in the office but she keeps a very detailed BP log at home and it is almost always <130/80. - Continue Toprol-XL 12.'5mg'$  daily.  Hyperlipidemia - Continue Lipitor '40mg'$  daily.  Disposition: Follow up in 1-2 months after stress test.   Medication Adjustments/Labs and Tests Ordered: Current  medicines are reviewed at length with the patient today.  Concerns regarding medicines are outlined above.  Orders Placed This Encounter  Procedures   MYOCARDIAL PERFUSION IMAGING   EKG 12-Lead   No orders of the defined types were placed in this encounter.   Patient Instructions  Medication Instructions:  Your physician recommends that you continue on your current medications as directed. Please refer to the Current Medication list given to you today.  *If you need a refill on your cardiac  medications before your next appointment, please call your pharmacy*   Lab Work: NONE ordered at this time of appointment   If you have labs (blood work) drawn today and your tests are completely normal, you will receive your results only by: [facility] (if you have MyChart) OR A paper copy in the mail If you have any lab test that is abnormal or we need to change your treatment, we will call you to review the results.   Testing/Procedures: Your physician has requested that you have an exercise stress myoview. For further information please visit [email or web address]. Please follow instruction sheet, as given.    Follow-Up: At [facility], you and your health needs are our priority.  As part of our continuing mission to provide you with exceptional heart care, we have created designated Provider Care Teams.  These Care Teams include your primary Cardiologist (physician) and Advanced Practice Providers (APPs -  Physician Assistants and Nurse Practitioners) who all work together to provide you with the care you need, when you need it.  We recommend signing up for the patient portal called "MyChart".  Sign up information is provided on this After Visit Summary.  MyChart is used to connect with patients for Virtual Visits (Telemedicine).  Patients are able to view lab/test results, encounter notes, upcoming appointments, etc.  Non-urgent messages can be sent to your provider as well.   To learn more about what you can do with MyChart, go to [email or web address].    Your next appointment:   2 month(s)  The format for your next appointment:   In Person  Provider:   Laurann Montana, NP    Other Instructions Your physician has requested that you have an exercise stress myoview. For further information please visit [email or web address]. Please follow instruction sheet, as given.   The test will take approximately 3 to 4 hours to complete; you may bring reading material.  If  someone comes with you to your appointment, they will need to remain in the main lobby due to limited space in the testing area. **If you are pregnant or breastfeeding, please notify the nuclear lab prior to your appointment**  How to prepare for your Myocardial Perfusion Test: Do not eat or drink 3 hours prior to your test, except you may have water. Do not consume products containing caffeine (regular or decaffeinated) 12 hours prior to your test. (ex: coffee, chocolate, sodas, tea). Do bring a list of your current medications with you.  If not listed below, you may take your medications as normal. Do wear comfortable clothes (no dresses or overalls) and walking shoes, tennis shoes preferred (No heels or open toe shoes are allowed). Do NOT wear cologne, perfume, aftershave, or lotions (deodorant is allowed). If these instructions are not followed your test will have to be rescheduled.   Please HOLD your Metoprolol the morning of the test    Signed, Darreld Mclean, PA-C  08/27/2022 9:50 PM    [facility]

## 2022-08-25 NOTE — Progress Notes (Incomplete)
Cardiology Office Note:    Date:  08/25/2022   ID:  [name], DOB 07/17/1946, MRN [id number]  PCP:  Donalynn Furlong, MD  Cardiologist:  Buford Dresser, MD  Electrophysiologist:  None   Referring MD: Donalynn Furlong, MD   Chief Complaint: ***  History of Present Illness:    Shannon [name] is a 76 y.o. female with a history of CAD s/p DES to RCA in 05/2022, SVT noted on monitor in 10/2020, hypertension, CVA, GERD, hypothyroidism, and migraines who is followed by Dr. Harrell Gave and presents today for chest pain.   Past Medical History:  Diagnosis Date  . Anxiety   . 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 Arthritis   . Cancer (HCC)    skin - squamous cell  . Complication of anesthesia    " I had difficulty coming out of it"  . Dyspnea    with exertion  . Family history of adverse reaction to anesthesia    " My daughter has a problem coming ou to it"  . GERD (gastroesophageal reflux disease)   . Headache    migraines in the past  . Hypertension   . Hypothyroidism   . Stroke ([place]) 11/2018   some weakness on right side   . SVT (supraventricular tachycardia) ([place])   . Thyroid disease     Past Surgical History:  Procedure Laterality Date  . ABDOMINAL HYSTERECTOMY    . APPENDECTOMY    . CARDIAC CATHETERIZATION    . CARPAL TUNNEL RELEASE Bilateral   . COLONOSCOPY    . CORONARY STENT INTERVENTION N/A 05/31/2022   Procedure: CORONARY STENT INTERVENTION;  Surgeon: Jettie Booze, MD;  Location: [facility];  Service: Cardiovascular;  Laterality: N/A;  . DILATION AND CURETTAGE OF UTERUS    . INTRAVASCULAR ULTRASOUND/IVUS N/A 05/31/2022   Procedure: Intravascular Ultrasound/IVUS;  Surgeon: Jettie Booze, MD;  Location: [facility];  Service: Cardiovascular;  Laterality: N/A;  . IR ANGIO INTRA EXTRACRAN SEL COM CAROTID INNOMINATE BILAT MOD SED  12/25/2018  . IR ANGIO INTRA EXTRACRAN SEL COM CAROTID INNOMINATE BILAT MOD SED  10/30/2019  . IR ANGIO  INTRA EXTRACRAN SEL COM CAROTID INNOMINATE BILAT MOD SED  09/23/2020  . IR ANGIO VERTEBRAL SEL SUBCLAVIAN INNOMINATE UNI L MOD SED  12/25/2018  . IR ANGIO VERTEBRAL SEL SUBCLAVIAN INNOMINATE UNI L MOD SED  10/30/2019  . IR ANGIO VERTEBRAL SEL SUBCLAVIAN INNOMINATE UNI L MOD SED  09/23/2020  . IR ANGIO VERTEBRAL SEL SUBCLAVIAN INNOMINATE UNI R MOD SED  01/04/2019  . IR ANGIO VERTEBRAL SEL VERTEBRAL UNI R MOD SED  12/25/2018  . IR ANGIO VERTEBRAL SEL VERTEBRAL UNI R MOD SED  10/30/2019  . IR ANGIO VERTEBRAL SEL VERTEBRAL UNI R MOD SED  09/23/2020  . IR CT HEAD LTD  01/04/2019  . IR CT HEAD LTD  11/21/2019  . IR INTRA CRAN STENT  01/04/2019  . IR INTRA CRAN STENT  11/21/2019  . IR RADIOLOGIST EVAL & MGMT  03/12/2021  . IR RADIOLOGIST EVAL & MGMT  09/10/2021  . IR TRANSCATH EXCRAN VERT OR CAR A STENT  11/17/2020  . IR US GUIDE VASC ACCESS RIGHT  12/25/2018  . IR US GUIDE VASC ACCESS RIGHT  11/02/2019  . LEFT HEART CATH AND CORONARY ANGIOGRAPHY N/A 05/31/2022   Procedure: LEFT HEART CATH AND CORONARY ANGIOGRAPHY;  Surgeon: Jettie Booze, MD;  Location: [facility];  Service: Cardiovascular;  Laterality: N/A;  .  OVARIAN CYST REMOVAL    . RADIOLOGY WITH ANESTHESIA N/A 01/04/2019   Procedure: RMCA angioplasty with possible stenting;  Surgeon: Luanne Bras, MD;  Location: [place];  Service: Radiology;  Laterality: N/A;  . RADIOLOGY WITH ANESTHESIA N/A 11/21/2019   Procedure: IR WITH ANESTHESIA/ STENT PLACEMENT;  Surgeon: Luanne Bras, MD;  Location: [place];  Service: Radiology;  Laterality: N/A;  . RADIOLOGY WITH ANESTHESIA N/A 10/01/2020   Procedure: IR WITH ANESTHESIA ANGIOPLASTY WITH POSSIBLE STENTING;  Surgeon: Luanne Bras, MD;  Location: [place];  Service: Radiology;  Laterality: N/A;  . RADIOLOGY WITH ANESTHESIA N/A 11/17/2020   Procedure: IR WITH ANESTHESIA STENT PLACEMENT;  Surgeon: Luanne Bras, MD;  Location: [name];  Service: Radiology;  Laterality: N/A;    Current  Medications: No outpatient medications have been marked as taking for the 08/27/22 encounter (Appointment) with Darreld Mclean, PA-C.     Allergies:   Elastic bandages & [zinc], Sulfa antibiotics, Tape, Mandol [cefamandole], and Sinutab non-drowsy max [pseudoephedrine-acetaminophen]   Social History   Socioeconomic History  . Marital status: Married    Spouse name: Not on file  . Number of children: Not on file  . Years of education: Not on file  . Highest education level: Not on file  Occupational History  . Occupation: retired  Tobacco Use  . Smoking status: Never  . Smokeless tobacco: Never  Vaping Use  . Vaping Use: Never used  Substance and Sexual Activity  . Alcohol use: Never  . Drug use: Never  . Sexual activity: Yes  Other Topics Concern  . Not on file  Social History Narrative  . Not on file   Social Determinants of Health   Financial Resource Strain: Not on file  Food Insecurity: Not on file  Transportation Needs: Not on file  Physical Activity: Not on file  Stress: Not on file  Social Connections: Not on file     Family History: The patient's ***family history includes Breast cancer in her sister and sister; CVA in her maternal aunt and mother; Heart disease in her mother; Peripheral Artery Disease in her father.  ROS:   Please see the history of present illness.    *** All other systems reviewed and are negative.  EKGs/Labs/Other Studies Reviewed:    The following studies were reviewed today: ***  EKG:  EKG *** ordered today. EKG personally reviewed and demonstrates ***.  Recent Labs: 10/28/2021: Magnesium 2.4; TSH 0.773 06/02/2022: BUN 12; Creatinine, Ser 0.71; Hemoglobin 14.4; Platelets 184; Potassium 4.1; Sodium 138  Recent Lipid Panel    Component Value Date/Time   CHOL 199 12/09/2018 0352   TRIG 110 12/09/2018 0352   HDL 45 12/09/2018 0352   CHOLHDL 4.4 12/09/2018 0352   VLDL 22 12/09/2018 0352   LDLCALC 132 (H) 12/09/2018 0352     Physical Exam:    Vital Signs: There were no vitals taken for this visit.    Wt Readings from Last 3 Encounters:  06/30/22 117 lb 4.8 oz (53.2 kg)  06/11/22 117 lb (53.1 kg)  06/02/22 115 lb (52.2 kg)     General: 76 y.o. female in no acute distress. HEENT: Normocephalic and atraumatic. Sclera clear. EOMs intact. Neck: Supple. No carotid bruits. No JVD. Heart: *** RRR. Distinct S1 and S2. No murmurs, gallops, or rubs. Radial and distal pedal pulses 2+ and equal bilaterally. Lungs: No increased work of breathing. Clear to ausculation bilaterally. No wheezes, rhonchi, or rales.  Abdomen: Soft, non-distended, and non-tender to palpation.  Bowel sounds present in all 4 quadrants.  MSK: Normal strength and tone for age. *** Extremities: No lower extremity edema.    Skin: Warm and dry. Neuro: Alert and oriented x3. No focal deficits. Psych: Normal affect. Responds appropriately.   Assessment:    No diagnosis found.  Plan:     Disposition: Follow up in ***   Medication Adjustments/Labs and Tests Ordered: Current medicines are reviewed at length with the patient today.  Concerns regarding medicines are outlined above.  No orders of the defined types were placed in this encounter.  No orders of the defined types were placed in this encounter.   There are no Patient Instructions on file for this visit.   Signed, Darreld Mclean, PA-C  08/25/2022 11:20 PM    [place]

## 2022-08-25 NOTE — Progress Notes (Addendum)
Daily Session Note  Patient Details  Name: Shannon [name] MRN: [id number] Date of Birth: July 31, 1946 Referring Provider:   Flowsheet Row Cardiac Rehab from 06/30/2022 in [facility]  Referring Provider Buford Dresser MD       Encounter Date: 08/25/2022  Check In:  Session Check In - 08/25/22 1108       Check-In   Supervising physician immediately available to respond to emergencies See telemetry face sheet for immediately available ER MD    Location [facility]-Cardiac & Pulmonary Rehab    Staff Present Heath Lark, RN, BSN, CCRP;Noah Tickle, BS, Exercise Physiologist;Other;Joseph Henning, RCP,RRT,BSRT   Kathee Delton, BS, Exercise Physiologist     Darlyne Russian RN ADN   Virtual Visit No    Medication changes reported     No    Fall or balance concerns reported    No    Warm-up and Cool-down Performed on first and last piece of equipment    Resistance Training Performed Yes    VAD Patient? No    PAD/SET Patient? No      Pain Assessment   Currently in Pain? No/denies                Social History   Tobacco Use  Smoking Status Never  Smokeless Tobacco Never    Goals Met:  Independence with exercise equipment Exercise tolerated well No report of concerns or symptoms today  Goals Unmet:  Not Applicable  Comments: Pt able to follow exercise prescription today without complaint.  Will continue to monitor for progression.  [name] stated she has chest pressure while exercising. It resolved with rest. She stated that the same symptom occurred the last day she was here, after she got home. 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  She was advised to call her cardiology physician and let them know of the symptoms occurring. She has an appt at end of the month, advised her to see if she could move appt up to this week. Also advised her if the symptoms occur again and do not resolve to call 911. She verbalized  understanding.   Dr. Emily Filbert is Medical Director for [facility].  Dr. Ottie Glazier is Medical Director for [facility].

## 2022-08-27 ENCOUNTER — Encounter: Payer: Self-pay | Admitting: Student

## 2022-08-27 ENCOUNTER — Ambulatory Visit: Payer: Medicare Other | Attending: Student | Admitting: Student

## 2022-08-27 VITALS — BP 152/78 | HR 84 | Ht 65.0 in | Wt 118.2 lb

## 2022-08-27 DIAGNOSIS — E785 Hyperlipidemia, unspecified: Secondary | ICD-10-CM

## 2022-08-27 DIAGNOSIS — Q2112 Patent foramen ovale: Secondary | ICD-10-CM

## 2022-08-27 DIAGNOSIS — I471 Supraventricular tachycardia: Secondary | ICD-10-CM

## 2022-08-27 DIAGNOSIS — R079 Chest pain, unspecified: Secondary | ICD-10-CM

## 2022-08-27 DIAGNOSIS — I2510 Atherosclerotic heart disease of native coronary artery without angina pectoris: Secondary | ICD-10-CM | POA: Diagnosis not present

## 2022-08-27 DIAGNOSIS — I10 Essential (primary) hypertension: Secondary | ICD-10-CM

## 2022-08-27 NOTE — Patient Instructions (Addendum)
Medication Instructions:  Your physician recommends that you continue on your current medications as directed. Please refer to the Current Medication list given to you today.  *If you need a refill on your cardiac medications before your next appointment, please call your pharmacy*   Lab Work: NONE ordered at this time of appointment   If you have labs (blood work) drawn today and your tests are completely normal, you will receive your results only by: [place] (if you have MyChart) OR A paper copy in the mail If you have any lab test that is abnormal or we need to change your treatment, we will call you to review the results.   Testing/Procedures: Your physician has requested that you have an exercise stress myoview. For further information please visit [email or web address]. Please follow instruction sheet, as given.    Follow-Up: At [facility], you and your health needs are our priority.  As part of our continuing mission to provide you with exceptional heart care, we have created designated Provider Care Teams.  These Care Teams include your primary Cardiologist (physician) and Advanced Practice Providers (APPs -  Physician Assistants and Nurse Practitioners) who all work together to provide you with the care you need, when you need it.  We recommend signing up for the patient portal called "MyChart".  Sign up information is provided on this After Visit Summary.  MyChart is used to connect with patients for Virtual Visits (Telemedicine).  Patients are able to view lab/test results, encounter notes, upcoming appointments, etc.  Non-urgent messages can be sent to your provider as well.   To learn more about what you can do with MyChart, go to [email or web address].    Your next appointment:   2 month(s)  The format for your next appointment:   In Person  Provider:   Laurann Montana, NP    Other Instructions Your physician has requested that you have an exercise  stress myoview. For further information please visit [email or web address]. Please follow instruction sheet, as given.   The test will take approximately 3 to 4 hours to complete; you may bring reading material.  If someone comes with you to your appointment, they will need to remain in the main lobby due to limited space in the testing area. **If you are pregnant or breastfeeding, please notify the nuclear lab prior to your appointment**  How to prepare for your Myocardial Perfusion Test: Do not eat or drink 3 hours prior to your test, except you may have water. Do not consume products containing caffeine (regular or decaffeinated) 12 hours prior to your test. (ex: coffee, chocolate, sodas, tea). Do bring a list of your current medications with you.  If not listed below, you may take your medications as normal. Do wear comfortable clothes (no dresses or overalls) and walking shoes, tennis shoes preferred (No heels or open toe shoes are allowed). Do NOT wear cologne, perfume, aftershave, or lotions (deodorant is allowed). If these instructions are not followed your test will have to be rescheduled.   Please HOLD your Metoprolol the morning of the test

## 2022-09-01 ENCOUNTER — Telehealth (HOSPITAL_COMMUNITY): Payer: Self-pay | Admitting: *Deleted

## 2022-09-01 NOTE — Telephone Encounter (Signed)
Close encounter 

## 2022-09-02 ENCOUNTER — Ambulatory Visit (HOSPITAL_COMMUNITY)
Admission: RE | Admit: 2022-09-02 | Discharge: 2022-09-02 | Disposition: A | Discharge status: Home / Self Care | Payer: Medicare Other | Source: Ambulatory Visit | Attending: Student | Admitting: Student

## 2022-09-02 DIAGNOSIS — R079 Chest pain, unspecified: Secondary | ICD-10-CM | POA: Diagnosis present

## 2022-09-02 LAB — MYOCARDIAL PERFUSION IMAGING
LV dias vol: 58 mL (ref 46–106)
LV sys vol: 19 mL
Nuc Stress EF: 68 %
Peak HR: 100 {beats}/min
Rest HR: 64 {beats}/min
Rest Nuclear Isotope Dose: 10.8 mCi
SDS: 0
SRS: 0
SSS: 0
Stress Nuclear Isotope Dose: 31.6 mCi
TID: 0.95

## 2022-09-02 MED ORDER — REGADENOSON 0.4 MG/5ML IV SOLN
0.4000 mg | Freq: Once | INTRAVENOUS | Status: AC
  Administered 2022-09-02: 0.4 mg via INTRAVENOUS

## 2022-09-02 MED ORDER — AMINOPHYLLINE 25 MG/ML IV SOLN
75.0000 mg | Freq: Once | INTRAVENOUS | Status: AC
  Administered 2022-09-02: 75 mg via INTRAVENOUS

## 2022-09-02 MED ORDER — TECHNETIUM TC 99M TETROFOSMIN IV KIT
31.6000 | PACK | Freq: Once | INTRAVENOUS | Status: AC | PRN
  Administered 2022-09-02: 31.6 via INTRAVENOUS

## 2022-09-02 MED ORDER — TECHNETIUM TC 99M TETROFOSMIN IV KIT
10.8000 | PACK | Freq: Once | INTRAVENOUS | Status: AC | PRN
  Administered 2022-09-02: 10.8 via INTRAVENOUS

## 2022-09-06 ENCOUNTER — Encounter: Payer: Self-pay | Admitting: *Deleted

## 2022-09-06 DIAGNOSIS — Z955 Presence of coronary angioplasty implant and graft: Secondary | ICD-10-CM

## 2022-09-10 ENCOUNTER — Ambulatory Visit (INDEPENDENT_AMBULATORY_CARE_PROVIDER_SITE_OTHER): Payer: Medicare Other | Admitting: Family

## 2022-09-10 ENCOUNTER — Encounter (HOSPITAL_BASED_OUTPATIENT_CLINIC_OR_DEPARTMENT_OTHER): Payer: Self-pay | Admitting: Family

## 2022-09-10 VITALS — BP 154/84 | HR 76 | Ht 65.0 in | Wt 118.1 lb

## 2022-09-10 DIAGNOSIS — I25118 Atherosclerotic heart disease of native coronary artery with other forms of angina pectoris: Secondary | ICD-10-CM | POA: Diagnosis not present

## 2022-09-10 DIAGNOSIS — I471 Supraventricular tachycardia, unspecified: Secondary | ICD-10-CM

## 2022-09-10 DIAGNOSIS — E785 Hyperlipidemia, unspecified: Secondary | ICD-10-CM | POA: Diagnosis not present

## 2022-09-10 DIAGNOSIS — Z8673 Personal history of transient ischemic attack (TIA), and cerebral infarction without residual deficits: Secondary | ICD-10-CM

## 2022-09-10 DIAGNOSIS — I10 Essential (primary) hypertension: Secondary | ICD-10-CM | POA: Diagnosis not present

## 2022-09-10 NOTE — Patient Instructions (Signed)
Medication Instructions:  Continue your current medications.   *If you need a refill on your cardiac medications before your next appointment, please call your pharmacy*  Lab Work: None ordered today.  Your recent cholesterol panel looked great!  Testing/Procedures: Your recent stress test looked great!  Follow-Up: At [facility], you and your health needs are our priority.  As part of our continuing mission to provide you with exceptional heart care, we have created designated Provider Care Teams.  These Care Teams include your primary Cardiologist (physician) and Advanced Practice Providers (APPs -  Physician Assistants and Nurse Practitioners) who all work together to provide you with the care you need, when you need it.  We recommend signing up for the patient portal called "MyChart".  Sign up information is provided on this After Visit Summary.  MyChart is used to connect with patients for Virtual Visits (Telemedicine).  Patients are able to view lab/test results, encounter notes, upcoming appointments, etc.  Non-urgent messages can be sent to your provider as well.   To learn more about what you can do with MyChart, go to [email or web address].    Your next appointment:   In November as scheduled with Dr. Harrell Gave  Other Instructions  Heart Healthy Diet Recommendations: A low-salt diet is recommended. Meats should be grilled, baked, or boiled. Avoid fried foods. Focus on lean protein sources like fish or chicken with vegetables and fruits. The American Heart Association is a Microbiologist!  American Heart Association Diet and Lifeystyle Recommendations   Exercise recommendations: The American Heart Association recommends 150 minutes of moderate intensity exercise weekly. Try 30 minutes of moderate intensity exercise 4-5 times per week. This could include walking, jogging, or swimming.  Important Information About Sugar

## 2022-09-10 NOTE — Progress Notes (Signed)
Office Visit    Patient Name: Shannon [name] Date of Encounter: 09/10/2022  PCP:  Donalynn Furlong, [place]  Cardiologist:  Buford Dresser, MD  Advanced Practice Provider:  No care team member to display Electrophysiologist:  None    Chief Complaint    Shannon [name] is a 76 y.o. female presents today for CAD follow up.  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   Arthritis    Cancer ([place])    skin - squamous cell   Complication of anesthesia    " I had difficulty coming out of it"   Dyspnea    with exertion   Family history of adverse reaction to anesthesia    " My daughter has a problem coming ou to it"   GERD (gastroesophageal reflux disease)    Headache    migraines in the past   Hypertension    Hypothyroidism    Stroke ([place]) 11/2018   some weakness on right side    SVT (supraventricular tachycardia) ([place])    Thyroid disease    Past Surgical History:  Procedure Laterality Date   ABDOMINAL HYSTERECTOMY     APPENDECTOMY     CARDIAC CATHETERIZATION     CARPAL TUNNEL RELEASE Bilateral    COLONOSCOPY     CORONARY STENT INTERVENTION N/A 05/31/2022   Procedure: CORONARY STENT INTERVENTION;  Surgeon: Jettie Booze, MD;  Location: [facility];  Service: Cardiovascular;  Laterality: N/A;   DILATION AND CURETTAGE OF UTERUS     INTRAVASCULAR ULTRASOUND/IVUS N/A 05/31/2022   Procedure: Intravascular Ultrasound/IVUS;  Surgeon: Jettie Booze, MD;  Location: [facility];  Service: Cardiovascular;  Laterality: N/A;   IR ANGIO INTRA EXTRACRAN SEL COM CAROTID INNOMINATE BILAT MOD SED  12/25/2018   IR ANGIO INTRA EXTRACRAN SEL COM CAROTID INNOMINATE BILAT MOD SED  10/30/2019   IR ANGIO INTRA EXTRACRAN SEL COM CAROTID INNOMINATE BILAT MOD SED  09/23/2020   IR ANGIO VERTEBRAL SEL SUBCLAVIAN INNOMINATE UNI L MOD SED  12/25/2018   IR ANGIO VERTEBRAL SEL  SUBCLAVIAN INNOMINATE UNI L MOD SED  10/30/2019   IR ANGIO VERTEBRAL SEL SUBCLAVIAN INNOMINATE UNI L MOD SED  09/23/2020   IR ANGIO VERTEBRAL SEL SUBCLAVIAN INNOMINATE UNI R MOD SED  01/04/2019   IR ANGIO VERTEBRAL SEL VERTEBRAL UNI R MOD SED  12/25/2018   IR ANGIO VERTEBRAL SEL VERTEBRAL UNI R MOD SED  10/30/2019   IR ANGIO VERTEBRAL SEL VERTEBRAL UNI R MOD SED  09/23/2020   IR CT HEAD LTD  01/04/2019   IR CT HEAD LTD  11/21/2019   IR INTRA CRAN STENT  01/04/2019   IR INTRA CRAN STENT  11/21/2019   IR RADIOLOGIST EVAL & MGMT  03/12/2021   IR RADIOLOGIST EVAL & MGMT  09/10/2021   IR TRANSCATH EXCRAN VERT OR CAR A STENT  11/17/2020   IR US GUIDE VASC ACCESS RIGHT  12/25/2018   IR US GUIDE VASC ACCESS RIGHT  11/02/2019   LEFT HEART CATH AND CORONARY ANGIOGRAPHY N/A 05/31/2022   Procedure: LEFT HEART CATH AND CORONARY ANGIOGRAPHY;  Surgeon: Jettie Booze, MD;  Location: [facility];  Service: Cardiovascular;  Laterality: N/A;   OVARIAN CYST REMOVAL     RADIOLOGY WITH ANESTHESIA N/A 01/04/2019   Procedure: RMCA angioplasty with possible stenting;  Surgeon: Luanne Bras, MD;  Location: [facility];  Service: Radiology;  Laterality: N/A;   RADIOLOGY WITH ANESTHESIA N/A 11/21/2019   Procedure: IR WITH ANESTHESIA/ STENT PLACEMENT;  Surgeon: Luanne Bras, MD;  Location: [facility];  Service: Radiology;  Laterality: N/A;   RADIOLOGY WITH ANESTHESIA N/A 10/01/2020   Procedure: IR WITH ANESTHESIA ANGIOPLASTY WITH POSSIBLE STENTING;  Surgeon: Luanne Bras, MD;  Location: [facility];  Service: Radiology;  Laterality: N/A;   RADIOLOGY WITH ANESTHESIA N/A 11/17/2020   Procedure: IR WITH ANESTHESIA STENT PLACEMENT;  Surgeon: Luanne Bras, MD;  Location: [facility];  Service: Radiology;  Laterality: N/A;   Allergies  Allergies  Allergen Reactions   Elastic Bandages & [Zinc] Itching   Sulfa Antibiotics Other (See Comments)    Tongue turned black   Tape     Pulled blood through the skin- happened  during a procedure in Oct 2021    Mandol [Cefamandole] Rash   Sinutab Non-Drowsy Max [Pseudoephedrine-Acetaminophen] Palpitations   History of Present Illness    Shannon [name] is a 76 y.o. female with a hx of hypertension, CVA, CAD last seen 08/27/22  Prior cardiac cath (date unknown) with reported 30% RCA lesion. Prior echo 2019 with normal LVEF, negative bubble study. Previous ETT 02/26/21 with fair exercise tolerance, 69m up-sloping to horizontal ST depressions in inferior lateral leads of borderline significance.  Due to chest pain cardiac CTA performed 12/03/2021 with coronary calcium score 150 placing her in the 67th percentile for age and sex matched control.  She had noncalcified plaques in the proximal RCA with moderate 50 to 69% stenosis, calcified plaque in the proximal RCA, LAD, proximal LCx with 0 to 24% stenosis.  Noted PFO.  FFR on RCA of 0.8 suggesting borderline likelihood of hemodynamic significance.  She was started on Imdur but did not tolerate.  Previously did not tolerate increased doses of metoprolol.  She was seen 12/2021 by Dr. CHarrell Gave She noted only mild swelling in her legs at the end of the day if she was on her feet most of the day.No changes were made at that time.  At follow up 05/20/22 noted exertional dyspnea and chest pain. Cardiac cath ordered and performed with PCI/DES to prox RCA, nonobstructive disease (dist LAD, mid-dist Cx 10%). Discharged on Brilinta '90mg'$  BID for at least 6 months though 3 months could be considered if bleeding issues.   ED visit 06/02/22 chest pain and headache. She had taken PRN nitroglycerin at home but then had hypotension. She was hypertensive in the ED. HS troponin unremarkable. CT head unremarkable. Seen in clinic 06/11/22 doing well and recommended to resume cardiac rehab.  Seen 08/27/22 by CSande Rives PA due to chest pain. Subsequent myoview 08/24/22 low risk study, no evidence of ischemia nor infarction, EF 68%.   Presents today for  follow up independently. Monitoring BP at home with readings most often <130/80.  Notes feeling fatigued with activity overall unchanged from prior. She does have some SBP <100 and encouraged her to eat and drink more during these times. Exercising by walking in her neighborhood daily. No significant chest pain, pressure, tightness. No edema, orthopnea, PND, lightheadedness.   EKGs/Labs/Other Studies Reviewed:   The following studies were reviewed today:  Myoview 09/02/22     The study is normal. The study is low risk based on normal perfusion.   Severely elevated pre-test BP of 201/92 mmHg.   LV perfusion is normal. There is no evidence of ischemia. There is no evidence of infarction.   Left ventricular function is  normal. Nuclear stress EF: 68 %. The left ventricular ejection fraction is hyperdynamic (>65%). End diastolic cavity size is normal. End systolic cavity size is normal.   Prior study not available for comparison.    LHC 05/2022   Prox RCA lesion is 75% stenosed.  A drug-eluting stent was successfully placed using a SYNERGY XD [id number], postdilated to 3.25 mm and optimized with IVUS.   Post intervention, there is a 0% residual stenosis.   Dist LAD lesion is 10% stenosed.   Mid Cx to Dist Cx lesion is 10% stenosed.   The left ventricular systolic function is normal.   LV end diastolic pressure is normal.   The left ventricular ejection fraction is 55-65% by visual estimate.   There is no aortic valve stenosis.   Successful PCI of the proximal RCA.  Patient was on a very low-dose of Brilinta in the past.  Apparently, she was a Plavix nonresponder.  She will need to go back to Brilinta 90 mg twice a day for 6 months, ideally.  Could consider 3 months if there were bleeding issues.  After that time, her Brilinta could be reduced.  Will CC Dr. Estanislado Pandy.   Continue aggressive secondary prevention.   Monitor 08/19/20 7 days of data recorded on Zio monitor. Patient had a min HR of 50 bpm,  max HR of 158 bpm, and avg HR of 85 bpm. Predominant underlying rhythm was Sinus Rhythm. No VT, atrial fibrillation, high degree block, or pauses noted. Isolated atrial and ventricular ectopy was rare (<1%). 524 Supraventricular Tachycardia runs occurred, the run with the fastest interval lasting 6 beats with a max rate of 158 bpm, the longest lasting 19 beats with an avg rate of 104 bpm.   ETT 02/26/21 Blood pressure demonstrated a normal response to exercise. Upsloping ST segment depression ST segment depression of 1 mm was noted during stress in the V5, V6, III and aVF leads.   ETT with fair exercise tolerance (6:19); no chest pain; normal blood pressure response; study terminated secondary to dyspnea; 1 mm of upsloping to horizontal ST depression in the inferior lateral leads of borderline significance; patient achieved 74% (inadequate) predicted maximum heart rate; suggest alternative study if clinical suspicion of ischemia high (nuclear study or cardiac CTA).   Coronary CT/Calcium Score 12/03/2021: Coronary Arteries:  Normal coronary origin.  Right dominance.   RCA is a large dominant artery that gives rise to PDA and PLA. There is noncalcified plaque in the proximal RCA causing 50-69% stenosis. Calcified plaque in the proximal RCA causes 0-24% stenosis   Left main is a large artery that gives rise to LAD and LCX arteries.   LAD is a large vessel. There is calcified plaque in the proximal LAD causing 0-24% stenosis   LCX is a non-dominant artery that gives rise to one large OM1 branch. There is calcified plaque in the proximal LCX causing 0-24% stenosis   Other findings:   Left Ventricle: Normal size   Left Atrium: Mild enlargement. PFO   Pulmonary Veins: Normal configuration   Right Ventricle: Normal size   Right Atrium: Normal size   Cardiac valves: No calcifications   Thoracic aorta: Normal size   Pulmonary Arteries: Normal size   Systemic Veins: Normal drainage    Pericardium: Normal thickness   IMPRESSION: 1. Coronary calcium score of 150. This was 67th percentile for age and sex matched control.   2.  Normal coronary origin with right dominance.   3. Noncalcified plaque in the  proximal RCA causes moderate (50-69%) stenosis   4. Calcified plaque in the proximal RCA, proximal LAD, and proximal LCX causes minimal (0-24%) stenosis   5.  PFO   6.  Will send for CT FFR   CAD-RADS 3. Moderate stenosis. Consider symptom-guided anti-ischemic pharmacotherapy as well as risk factor modification per guideline directed care. Additional analysis with CT FFR will be submitted.   EKG:  EKG is not ordered today.   Recent Labs: 10/28/2021: Magnesium 2.4; TSH 0.773 06/02/2022: BUN 12; Creatinine, Ser 0.71; Hemoglobin 14.4; Platelets 184; Potassium 4.1; Sodium 138  Recent Lipid Panel    Component Value Date/Time   CHOL 199 12/09/2018 0352   TRIG 110 12/09/2018 0352   HDL 45 12/09/2018 0352   CHOLHDL 4.4 12/09/2018 0352   VLDL 22 12/09/2018 0352   LDLCALC 132 (H) 12/09/2018 0352   Home Medications   Current Meds  Medication Sig   acetaminophen (TYLENOL) 325 MG tablet Take 325 mg by mouth every 6 (six) hours as needed for headache.    aspirin EC 81 MG EC tablet Take 1 tablet (81 mg total) by mouth daily.   atorvastatin (LIPITOR) 40 MG tablet Take 1 tablet (40 mg total) by mouth daily at 6 PM.   Calcium Carb-Cholecalciferol (CALCIUM 600 + D PO) Take 300-600 mg by mouth See admin instructions. Take 600 mg after breakfast, 600 mg after lunch, and 300 mg after dinner   Coenzyme Q10 50 MG CAPS Take 50 mg by mouth daily.    levothyroxine (SYNTHROID) 75 MCG tablet Take 75 mcg by mouth every Tuesday, Thursday, Saturday, and Sunday.    levothyroxine (SYNTHROID, LEVOTHROID) 50 MCG tablet Take 50 mcg by mouth every Monday, Wednesday, and Friday.    loratadine (CLARITIN) 10 MG tablet Take 10 mg by mouth daily.   metoprolol succinate (TOPROL-XL) 25 MG 24 hr  tablet Take 0.5 tablets (12.5 mg total) by mouth daily.   Multiple Vitamin (MULTIVITAMIN) capsule Take 1 capsule by mouth daily. Senior   Polyvinyl Alcohol-Povidone PF (REFRESH) 1.4-0.6 % SOLN Apply 1 drop to eye as needed (dry eye). Both eyes   Propylene Glycol (SYSTANE BALANCE OP) Apply 1 drop to eye as needed (dry eye). Both eyes.   ticagrelor (BRILINTA) 90 MG TABS tablet Take 1 tablet (90 mg total) by mouth 2 (two) times daily.   vitamin E 400 UNIT capsule Take 400 Units by mouth every other day.      Review of Systems      All other systems reviewed and are otherwise negative except as noted above.  Physical Exam    VS:  BP (!) 182/82 (BP Location: Left Arm, Patient Position: Sitting, Cuff Size: Normal)   Pulse 76   Ht '5\' 5"'$  (1.651 m)   Wt 118 lb 1.6 oz (53.6 kg)   SpO2 100%   BMI 19.65 kg/m  , BMI Body mass index is 19.65 kg/m.  Wt Readings from Last 3 Encounters:  09/10/22 118 lb 1.6 oz (53.6 kg)  09/02/22 118 lb (53.5 kg)  08/27/22 118 lb 3.2 oz (53.6 kg)   GEN: Well nourished, well developed, in no acute distress. HEENT: normal. Neck: Supple, no JVD, carotid bruits, or masses. Cardiac: RRR, no murmurs, rubs, or gallops. No clubbing, cyanosis, edema.  Radials/PT 2+ and equal bilaterally.  Respiratory:  Respirations regular and unlabored, clear to auscultation bilaterally. GI: Soft, nontender, nondistended. MS: No deformity or atrophy. Skin: Warm and dry, no rash.  Neuro:  Strength and sensation are intact.  Anxious.  Psych: Normal affect.  Assessment & Plan    Coronary artery disease - PCI/DES RCA 05/31/22. 08/2022 low risk myoview. GDMT includes ASA, Brilinta, Atorvastatin, PRN nitroglycerin. Decreased exercise tolerance which she attributes to deconditioning. Plans to gradually increase activity [place] Will discuss possible referral to outpatient PT at follow up per her request. If exertional dyspnea unchanged, could consider  referral to pulmonology for PFTs. Heart healthy diet and regular cardiovascular exercise encouraged.    Palpitations / Tachycardia / Bradycardia / Sinus pause 1.68 sec - Previous monitor with paroxymal SVT. Continue Toprol 12.'5mg'$  QD. Did not tolerate increased doses nor Diltiazem. Continue to stay well hydrated, avoid caffeine, manage stress well.  PFO -incidental finding on CT.  Negative bubble study on prior echo.  History of stroke but was due to ischemia/stenosis. No indication for closure.   HTN - Labile blood pressure at home. But overall well controlled 110s-130s. Per Dr. Harrell Gave base antihypertensive regimen on home readings as element of Jenne coat hypertension. BP reasonably well controlled in clinic - continue current regimen.   HLD - 05/2022 LDL 51 by outside labs. Continue Atorvastatin '40mg'$  daily.   Hypothyroidism - Managed by primary care.   History of CVA - On Aspirin, Brilinta per interventional neurology. Follows with neurology.   Disposition: Follow up in November as scheduled with Buford Dresser, MD or APP.  Signed, Loel Dubonnet, NP 09/10/2022, 11:19 AM [place]

## 2022-09-13 ENCOUNTER — Encounter: Payer: Self-pay | Admitting: *Deleted

## 2022-09-13 ENCOUNTER — Encounter (HOSPITAL_BASED_OUTPATIENT_CLINIC_OR_DEPARTMENT_OTHER): Payer: Self-pay

## 2022-09-13 DIAGNOSIS — Z955 Presence of coronary angioplasty implant and graft: Secondary | ICD-10-CM

## 2022-09-13 NOTE — Progress Notes (Signed)
Cardiac Individual Treatment Plan  Patient Details  Name: Shannon [name] MRN: [id number] Date of Birth: Jan 29, 1946 Referring Provider:   Flowsheet Row Cardiac Rehab from 06/30/2022 in [facility] Cardiac and Pulmonary Rehab  Referring Provider Buford Dresser MD       Initial Encounter Date:  Flowsheet Row Cardiac Rehab from 06/30/2022 in [facility] Cardiac and Pulmonary Rehab  Date 06/30/22       Visit Diagnosis: No diagnosis found.  Patient's Home Medications on Admission:  Current Outpatient Medications:    acetaminophen (TYLENOL) 325 MG tablet, Take 325 mg by mouth every 6 (six) hours as needed for headache. , Disp: , Rfl:    aspirin EC 81 MG EC tablet, Take 1 tablet (81 mg total) by mouth daily., Disp: 30 tablet, Rfl: 0   atorvastatin (LIPITOR) 40 MG tablet, Take 1 tablet (40 mg total) by mouth daily at 6 PM., Disp: 30 tablet, Rfl: 0   Calcium Carb-Cholecalciferol (CALCIUM 600 + D PO), Take 300-600 mg by mouth See admin instructions. Take 600 mg after breakfast, 600 mg after lunch, and 300 mg after dinner, Disp: , Rfl:    Coenzyme Q10 50 MG CAPS, Take 50 mg by mouth daily. , Disp: , Rfl:    levothyroxine (SYNTHROID) 75 MCG tablet, Take 75 mcg by mouth every Tuesday, Thursday, Saturday, and Sunday. , Disp: , Rfl:    levothyroxine (SYNTHROID, LEVOTHROID) 50 MCG tablet, Take 50 mcg by mouth every Monday, Wednesday, and Friday. , Disp: , Rfl:    loratadine (CLARITIN) 10 MG tablet, Take 10 mg by mouth daily., Disp: , Rfl:    metoprolol succinate (TOPROL-XL) 25 MG 24 hr tablet, Take 0.5 tablets (12.5 mg total) by mouth daily., Disp: 45 tablet, Rfl: 3   Multiple Vitamin (MULTIVITAMIN) capsule, Take 1 capsule by mouth daily. Senior, Disp: , Rfl:    nitroGLYCERIN (NITROSTAT) 0.4 MG SL tablet, Place 1 tablet (0.4 mg total) under the tongue every 5 (five) minutes as needed for chest pain., Disp: 25 tablet, Rfl: 3   Polyvinyl Alcohol-Povidone PF (REFRESH) 1.4-0.6 % SOLN, Apply 1 drop to eye as needed  (dry eye). Both eyes, Disp: , Rfl:    Propylene Glycol (SYSTANE BALANCE OP), Apply 1 drop to eye as needed (dry eye). Both eyes., Disp: , Rfl:    ticagrelor (BRILINTA) 90 MG TABS tablet, Take 1 tablet (90 mg total) by mouth 2 (two) times daily., Disp: 180 tablet, Rfl: 3   vitamin E 400 UNIT capsule, Take 400 Units by mouth every other day. 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)    skin - squamous cell   Complication of anesthesia    " I had difficulty coming out of it"   Dyspnea    with exertion   Family history of adverse reaction to anesthesia    " My daughter has a problem coming ou to it"   GERD (gastroesophageal reflux disease)    Headache    migraines in the past   Hypertension    Hypothyroidism    Stroke ([place]) 11/2018   some weakness on right side    SVT (supraventricular tachycardia) ([facility])    Thyroid disease     Tobacco Use: Social History   Tobacco Use  Smoking Status Never  Smokeless Tobacco Never    Labs: Review Flowsheet  Latest Ref Rng & Units 12/09/2018 12/24/2018 01/04/2019 09/23/2020  Labs for ITP Cardiac and Pulmonary Rehab  Cholestrol 0 - 200 mg/dL 199  - - -  LDL (calc) 0 - 99 mg/dL 132  - - -  HDL-C >40 mg/dL 45  - - -  Trlycerides <150 mg/dL 110  - - -  Hemoglobin A1c 4.8 - 5.6 % 5.4  - - -  PH, Arterial 7.350 - 7.450 - - 7.415  -  PCO2 arterial 32.0 - 48.0 mmHg - - 33.8  -  Bicarbonate 20.0 - 28.0 mmol/L - - 21.7  -  TCO2 22 - 32 mmol/L - _0 Acid-base deficit 0.0 - 2.0 mmol/L - - 3.0  -  O2 Saturation % - - 100.0  -     Exercise Target Goals: Exercise Program Goal: Individual exercise prescription set using results from initial 6 min walk test and THRR while considering  patient's activity barriers and safety.   Exercise Prescription Goal: Initial exercise prescription builds to 30-45 minutes a day of  aerobic activity, 2-3 days per week.  Home exercise guidelines will be given to patient during program as part of exercise prescription that the participant will acknowledge.   Education: Aerobic Exercise: - Group verbal and visual presentation on the components of exercise prescription. Introduces F.I.T.T principle from ACSM for exercise prescriptions.  Reviews F.I.T.T. principles of aerobic exercise including progression. Written material given at graduation. Flowsheet Row Cardiac Rehab from 08/18/2022 in [facility] Cardiac and Pulmonary Rehab  Date 08/18/22  Educator [facility]  Instruction Review Code 1- Verbalizes Understanding       Education: Resistance Exercise: - Group verbal and visual presentation on the components of exercise prescription. Introduces F.I.T.T principle from ACSM for exercise prescriptions  Reviews F.I.T.T. principles of resistance exercise including progression. Written material given at graduation.    Education: Exercise & Equipment Safety: - Individual verbal instruction and demonstration of equipment use and safety with use of the equipment. Flowsheet Row Cardiac Rehab from 08/18/2022 in [facility] Cardiac and Pulmonary Rehab  Education need identified 06/30/22  Date 06/30/22  Educator [place]  Instruction Review Code 1- Verbalizes Understanding       Education: Exercise Physiology & General Exercise Guidelines: - Group verbal and written instruction with models to review the exercise physiology of the cardiovascular system and associated critical values. Provides general exercise guidelines with specific guidelines to those with heart or lung disease.  Flowsheet Row Cardiac Rehab from 08/18/2022 in [facility] Cardiac and Pulmonary Rehab  Date 08/11/22  Educator [facility]  Instruction Review Code 1- Verbalizes Understanding       Education: Flexibility, Balance, Mind/Body Relaxation: - Group verbal and visual presentation with interactive activity on the components of exercise prescription.  Introduces F.I.T.T principle from ACSM for exercise prescriptions. Reviews F.I.T.T. principles of flexibility and balance exercise training including progression. Also discusses the mind body connection.  Reviews various relaxation techniques to help reduce and manage stress (i.e. Deep breathing, progressive muscle relaxation, and visualization). Balance handout provided to take home. Written material given at graduation.   Activity Barriers & Risk Stratification:  Activity Barriers & Cardiac Risk Stratification - 06/30/22 1243       Activity Barriers & Cardiac Risk Stratification   Activity Barriers Shortness of Breath;Deconditioning;Muscular Weakness;Other (comment)    Comments CVA in 2019 --> left sided weakness    Cardiac Risk Stratification High             6 Minute  Walk:  6 Minute Walk     Row Name 06/30/22 1303         6 Minute Walk   Phase Initial     Distance 1140 feet     Walk Time 6 minutes     # of Rest Breaks 0     MPH 2.15     METS 2.63     RPE 10     Perceived Dyspnea  0     VO2 Peak 9.23     Symptoms Yes (comment)     Comments Fatigue     Resting HR 71 bpm     Resting BP 118/74     Resting Oxygen Saturation  99 %     Exercise Oxygen Saturation  during 6 min walk 95 %     Max Ex. HR 83 bpm     Max Ex. BP 144/76     2 Minute Post BP 124/74              Oxygen Initial Assessment:   Oxygen Re-Evaluation:   Oxygen Discharge (Final Oxygen Re-Evaluation):   Initial Exercise Prescription:  Initial Exercise Prescription - 06/30/22 1500       Date of Initial Exercise RX and Referring Provider   Date 06/30/22    Referring Provider Buford Dresser MD      Oxygen   Maintain Oxygen Saturation 88% or higher      Treadmill   MPH 1.9    Grade 0.5    Minutes 15    METs 2.59      NuStep   Level 1    SPM 80    Minutes 15    METs 2.6      REL-XR   Level 1    Speed 50    Minutes 15    METs 2.6      T5 Nustep   Level 1    SPM  80    Minutes 15    METs 2.6      Prescription Details   Frequency (times per week) 3    Duration Progress to 30 minutes of continuous aerobic without signs/symptoms of physical distress      Intensity   THRR 40-80% of Max Heartrate 100 - 129    Ratings of Perceived Exertion 11-13    Perceived Dyspnea 0-4      Progression   Progression Continue to progress workloads to maintain intensity without signs/symptoms of physical distress.      Resistance Training   Training Prescription Yes    Weight 2 lb    Reps 10-15             Perform Capillary Blood Glucose checks as needed.  Exercise Prescription Changes:   Exercise Prescription Changes     Row Name 06/30/22 1500 07/15/22 1400 07/21/22 0900 07/28/22 1300 08/10/22 1500     Response to Exercise   Blood Pressure (Admit) 118/74 126/68 -- 108/62 138/74   Blood Pressure (Exercise) 144/76 158/72 -- 172/84 --   Blood Pressure (Exit) 124/74 118/68 -- 132/70 124/62   Heart Rate (Admit) 71 bpm 87 bpm -- 75 bpm 73 bpm   Heart Rate (Exercise) 83 bpm 101 bpm -- 100 bpm 97 bpm   Heart Rate (Exit) 70 bpm 81 bpm -- 79 bpm 81 bpm   Oxygen Saturation (Admit) 99 % -- -- -- --   Oxygen Saturation (Exercise) 95 % -- -- -- --   Oxygen Saturation (Exit) 96 % -- -- -- --  Rating of Perceived Exertion (Exercise) 10 13 -- 14 13   Perceived Dyspnea (Exercise) 0 -- -- -- --   Symptoms fatigued none -- none none   Comments walk test results 1st full day of exercise -- -- --   Duration -- Progress to 30 minutes of  aerobic without signs/symptoms of physical distress -- Progress to 30 minutes of  aerobic without signs/symptoms of physical distress Continue with 30 min of aerobic exercise without signs/symptoms of physical distress.   Intensity -- THRR unchanged -- THRR unchanged THRR unchanged     Progression   Progression -- Continue to progress workloads to maintain intensity without signs/symptoms of physical distress. -- Continue to progress  workloads to maintain intensity without signs/symptoms of physical distress. Continue to progress workloads to maintain intensity without signs/symptoms of physical distress.   Average METs -- 2.3 -- 2.6 2.95     Resistance Training   Training Prescription -- Yes -- Yes Yes   Weight -- 2 lb -- 3 lb 3 lb   Reps -- 10-15 -- 10-15 10-15     Interval Training   Interval Training -- -- -- No No     Treadmill   MPH -- 1.9 -- 2 2.2   Grade -- 0.5 -- 0.5 0.5   Minutes -- 15 -- 15 15   METs -- 2.59 -- 2.67 2.84     Recumbant Bike   Level -- -- -- 1.5 3   Watts -- -- -- -- 22   Minutes -- -- -- 15 15   METs -- -- -- 3.47 3.47     NuStep   Level -- 4 -- 2 4   Minutes -- 15 -- 15 15   METs -- 2.1 -- 1.8 2.2     REL-XR   Level -- 2 -- 2 --   Minutes -- 15 -- 15 --   METs -- 3.8 -- 1.9 --     Track   Laps -- 29 -- 25 --   Minutes -- 15 -- 15 --   METs -- 2.58 -- 2.36 --     Home Exercise Plan   Plans to continue exercise at -- -- Home (comment)  Walking at Home (Treadmill) Home (comment)  Walking at Home (Treadmill) Home (comment)  Walking at Home (Treadmill)   Frequency -- -- Add 2 additional days to program exercise sessions. Add 2 additional days to program exercise sessions. Add 2 additional days to program exercise sessions.   Initial Home Exercises Provided -- -- 07/21/22 07/21/22 07/21/22     Oxygen   Maintain Oxygen Saturation -- 88% or higher 88% or higher 88% or higher 88% or higher    Row Name 08/26/22 1000             Response to Exercise   Blood Pressure (Admit) 114/60       Blood Pressure (Exit) 112/64       Heart Rate (Admit) 82 bpm       Heart Rate (Exercise) 109 bpm       Heart Rate (Exit) 87 bpm       Rating of Perceived Exertion (Exercise) 14       Symptoms none       Duration Continue with 30 min of aerobic exercise without signs/symptoms of physical distress.       Intensity THRR unchanged         Progression   Progression Continue to progress  workloads to maintain intensity  without signs/symptoms of physical distress.       Average METs 2.97         Resistance Training   Training Prescription Yes       Weight 3 lb       Reps 10-15         Interval Training   Interval Training No         Treadmill   MPH 2.3       Grade 1       Minutes 15       METs 3.08         REL-XR   Level 1       Minutes 15         T5 Nustep   Level 1       Minutes 15         Home Exercise Plan   Plans to continue exercise at Home (comment)  Walking at Home (Treadmill)       Frequency Add 2 additional days to program exercise sessions.       Initial Home Exercises Provided 07/21/22         Oxygen   Maintain Oxygen Saturation 88% or higher                Exercise Comments:   Exercise Comments     Row Name 07/02/22 0956 08/25/22 1144         Exercise Comments First full day of exercise!  Patient was oriented to gym and equipment including functions, settings, policies, and procedures.  Patient's individual exercise prescription and treatment plan were reviewed.  All starting workloads were established based on the results of the 6 minute walk test done at initial orientation visit.  The plan for exercise progression was also introduced and progression will be customized based on patient's performance and goals. [place] stated she has chest pressure while exercising. It resolved with rest. She stated that the same symptom occurred the last day she was here, after she got home. 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  She was advised to call her cardiology physician and let them know of the symptoms occurring. She has an appt at end of the month, advised her to see if she could move appt up to this week. Also advised her if the symptoms occur again and do not resolve to call 911. She verbalized understanding.               Exercise Goals and Review:   Exercise Goals     Row Name 06/30/22  1304             Exercise Goals   Increase Physical Activity Yes       Intervention Provide advice, education, support and counseling about physical activity/exercise needs.;Develop an individualized exercise prescription for aerobic and resistive training based on initial evaluation findings, risk stratification, comorbidities and participant's personal goals.       Expected Outcomes Short Term: Attend rehab on a regular basis to increase amount of physical activity.;Long Term: Add in home exercise to make exercise part of routine and to increase amount of physical activity.;Long Term: Exercising regularly at least 3-5 days a week.       Increase Strength and Stamina Yes       Intervention Provide advice, education, support and counseling about physical activity/exercise needs.;Develop an individualized exercise prescription for aerobic and resistive training based on initial evaluation  findings, risk stratification, comorbidities and participant's personal goals.       Expected Outcomes Short Term: Increase workloads from initial exercise prescription for resistance, speed, and METs.;Short Term: Perform resistance training exercises routinely during rehab and add in resistance training at home;Long Term: Improve cardiorespiratory fitness, muscular endurance and strength as measured by increased METs and functional capacity (6MWT)       Able to understand and use rate of perceived exertion (RPE) scale Yes       Intervention Provide education and explanation on how to use RPE scale       Expected Outcomes Short Term: Able to use RPE daily in rehab to express subjective intensity level;Long Term:  Able to use RPE to guide intensity level when exercising independently       Able to understand and use Dyspnea scale Yes       Intervention Provide education and explanation on how to use Dyspnea scale       Expected Outcomes Short Term: Able to use Dyspnea scale daily in rehab to express subjective sense  of shortness of breath during exertion;Long Term: Able to use Dyspnea scale to guide intensity level when exercising independently       Knowledge and understanding of Target Heart Rate Range (THRR) Yes       Intervention Provide education and explanation of THRR including how the numbers were predicted and where they are located for reference       Expected Outcomes Short Term: Able to state/look up THRR;Long Term: Able to use THRR to govern intensity when exercising independently;Short Term: Able to use daily as guideline for intensity in rehab       Able to check pulse independently Yes       Intervention Provide education and demonstration on how to check pulse in carotid and radial arteries.;Review the importance of being able to check your own pulse for safety during independent exercise       Expected Outcomes Short Term: Able to explain why pulse checking is important during independent exercise;Long Term: Able to check pulse independently and accurately       Understanding of Exercise Prescription Yes       Intervention Provide education, explanation, and written materials on patient's individual exercise prescription       Expected Outcomes Short Term: Able to explain program exercise prescription;Long Term: Able to explain home exercise prescription to exercise independently                Exercise Goals Re-Evaluation :  Exercise Goals Re-Evaluation     Row Name 07/02/22 0956 07/15/22 1434 07/21/22 1000 07/28/22 0930 07/28/22 1310     Exercise Goal Re-Evaluation   Exercise Goals Review Able to understand and use Dyspnea scale;Able to check pulse independently;Knowledge and understanding of Target Heart Rate Range (THRR);Understanding of Exercise Prescription;Able to understand and use rate of perceived exertion (RPE) scale Increase Physical Activity;Increase Strength and Stamina;Understanding of Exercise Prescription -- Increase Physical Activity;Increase Strength and  Stamina;Understanding of Exercise Prescription Increase Physical Activity;Increase Strength and Stamina;Understanding of Exercise Prescription   Comments Reviewed RPE and dyspnea scales, THR and program prescription with pt today.  Pt voiced understanding and was given a copy of goals to take home. Alveria is off to a good start in rehab. She has already worked up from level 1 to level 4 on the T4. She has walked up to 29 laps on the track. She also has improved to level 2 on the XR. She  has done well with the treadmill on 1.9 mph and an incline of 0.5%. Jaiyla is also up tp an overall average MET level of 2.3 METs. We will continue monitor her progress in the program. Reviewed home exercise with pt today.  Pt plans to walk at home both on the treadmill and outside for exercise.  Reviewed THR, pulse, RPE, sign and symptoms, pulse oximetery and when to call 911 or MD.  Also discussed weather considerations and indoor options.  Pt voiced understanding. She walks for about 30 minutes - she tries to increase her distance every time (1-1.5 miles), she would like to get to 2 miles; she walks outside so this can vary. Encouraged to use treadmill at home when it is too hot outside. Lekeshia is doing well in rehab. She recently increased her overall average MET level to 2.6 METs. She also improved from 2 lb to 3 lb hand weights for resistance training. She increased her workload on the treadmill as well, to a speed of 2 mph and an incline of 0.5%. We will continue to monitor her progress in the program.   Expected Outcomes Short: Use RPE daily to regulate intensity. Long: Follow program prescription in THR. Short: Continue to increase speed on the treadmill. Long: Continue to increase strength and stamina. Short: Continue to walk at home on off days. Long: Continue to exercise independently. Short: Continue to walk at home on off days. Long: Continue to exercise independently. Short: Continue to increase workload on treadmill.  Long: Continue to to increase strength and stamina.    [place] Name 08/10/22 1507 08/26/22 1006 09/06/22 1403         Exercise Goal Re-Evaluation   Exercise Goals Review Increase Physical Activity;Increase Strength and Stamina;Understanding of Exercise Prescription Increase Physical Activity;Increase Strength and Stamina;Understanding of Exercise Prescription --     Comments [facility] continues to do well in rehab. She has increased to 2.2 speed on the treadmill. She is also working at level 3 on the bike. We hope to see her watts improve overtime. RPEs are staying in appropriate range. Will continue to monitor. Alajah is doing well in rehab. She recently increased her overall average MET level to 2.97 METs. She also increased her workload on the treadmill to a speed of 2.3 mph and an incline of 1%. She is looking to improve on her post 6MWT soon as well. We will continue to monitor her progress. Out since last review with chest pain work up.  Follow up scheduled for 09/10/22.     Expected Outcomes Short: Increase watts on recumbent bike Long: Continue to increase overall MET level Short: Improve on post 6MWT. Long: Continue to increase strength and stamina. --              Discharge Exercise Prescription (Final Exercise Prescription Changes):  Exercise Prescription Changes - 08/26/22 1000       Response to Exercise   Blood Pressure (Admit) 114/60    Blood Pressure (Exit) 112/64    Heart Rate (Admit) 82 bpm    Heart Rate (Exercise) 109 bpm    Heart Rate (Exit) 87 bpm    Rating of Perceived Exertion (Exercise) 14    Symptoms none    Duration Continue with 30 min of aerobic exercise without signs/symptoms of physical distress.    Intensity THRR unchanged      Progression   Progression Continue to progress workloads to maintain intensity without signs/symptoms of physical distress.    Average METs 2.97  Resistance Training   Training Prescription Yes    Weight 3 lb    Reps 10-15       Interval Training   Interval Training No      Treadmill   MPH 2.3    Grade 1    Minutes 15    METs 3.08      REL-XR   Level 1    Minutes 15      T5 Nustep   Level 1    Minutes 15      Home Exercise Plan   Plans to continue exercise at Home (comment)   Walking at Home (Treadmill)   Frequency Add 2 additional days to program exercise sessions.    Initial Home Exercises Provided 07/21/22      Oxygen   Maintain Oxygen Saturation 88% or higher             Nutrition:  Target Goals: Understanding of nutrition guidelines, daily intake of sodium <1578m, cholesterol <2035m calories 30% from fat and 7% or less from saturated fats, daily to have 5 or more servings of fruits and vegetables.  Education: All About Nutrition: -Group instruction provided by verbal, written material, interactive activities, discussions, models, and posters to present general guidelines for heart healthy nutrition including fat, fiber, MyPlate, the role of sodium in heart healthy nutrition, utilization of the nutrition label, and utilization of this knowledge for meal planning. Follow up email sent as well. Written material given at graduation. [facility]  Education need identified 06/30/22  Date 07/07/22  Educator MCRoffInstruction Review Code 1- Verbalizes Understanding       Biometrics:  Pre Biometrics - 06/30/22 1243       Pre Biometrics   Height 5' 5.5" (1.664 m)    Weight 117 lb 4.8 oz (53.2 kg)    BMI (Calculated) 19.22    Single Leg Stand 3.5 seconds              Nutrition Therapy Plan and Nutrition Goals:  Nutrition Therapy & Goals - 07/20/22 0923       Nutrition Therapy   Diet Heart healthy, low Na    Drug/Food Interactions Statins/Certain Fruits    Protein (specify units) 60-65g    Fiber 25 grams    Whole Grain Foods 3 servings    Saturated Fats 12 max. grams    Fruits and Vegetables 8 servings/day     Sodium 2 grams      Personal Nutrition Goals   Nutrition Goal ST: eat a rainbow of fruits/vegetables, choose plant foods from different groups (beans/lentils, berries, cruciferous vegetables, leafy greens, etc), include seasonal fruits/vegetables LT: maintain heart healthy habits, include a variety of foods    Comments [age].o. F admitted to cardiac rehab s/p coronary artery stent placement. PMHx includes HTN, TIA, CAD, hypothyroidism. Relevant medications includes lipitor, CoQ10, synthroid, MVI, vit E, skin cancer, GERD. PYP Score: 82. Vegetables & Fruits 8/12. Breads, Grains & Cereals 9/12. Red & Processed Meat 12/12. Poultry 2/2. Fish & Shellfish 3/4. Beans, Nuts & Seeds 3/4. Milk & Dairy Foods 4/6. Toppings, Oils, Seasonings & Salt 20/20. Sweets, Snacks & Restaurant Food 11/14. Beverages 10/10. [place] that 3 years ago after her stroke she has changed her diet. B: oatmeal (rolled oats), cranberry juice (cranberry juice cocktail) - small glass, 1 cup of coffee  L: (largest meal) meat, potatoes or sweet potato, non starcy vegetables, skim milk.  S:  fruit or nuts D: 4 unsalted [place] crackers with peanut butter and orange, skim milk. Drinks: water. She uses canola oil for cooking and she does not salt her food. She is mindful of her food labels and is mindful of trans fat, saturated fat, and sodium. Reviewed general heart healthy eating; she had gone to nutrition education last week and reports that it was "straight foward" and does not have any further questions at this time. Encouraged her to include more variety.      Intervention Plan   Intervention Prescribe, educate and counsel regarding individualized specific dietary modifications aiming towards targeted core components such as weight, hypertension, lipid management, diabetes, heart failure and other comorbidities.    Expected Outcomes Short Term Goal: Understand basic principles of dietary content, such as calories, fat, sodium, cholesterol and  nutrients.;Short Term Goal: A plan has been developed with personal nutrition goals set during dietitian appointment.;Long Term Goal: Adherence to prescribed nutrition plan.             Nutrition Assessments:  MEDIFICTS Score Key: ?70 Need to make dietary changes  40-70 Heart Healthy Diet ? 40 Therapeutic Level Cholesterol Diet  Flowsheet Row Cardiac Rehab from 06/30/2022 in [facility]  Picture Your Plate Total Score on Admission 82      Picture Your Plate Scores: <38 Unhealthy dietary pattern with much room for improvement. 41-50 Dietary pattern unlikely to meet recommendations for good health and room for improvement. 51-60 More healthful dietary pattern, with some room for improvement.  >60 Healthy dietary pattern, although there may be some specific behaviors that could be improved.    Nutrition Goals Re-Evaluation:  Nutrition Goals Re-Evaluation     [place] Name 07/28/22 2505             Goals   Nutrition Goal ST: eat a rainbow of fruits/vegetables, choose plant foods from different groups (beans/lentils, berries, cruciferous vegetables, leafy greens, etc), include seasonal fruits/vegetables LT: maintain heart healthy habits, include a variety of foods       Comment Jasmina feels she is doing well with her nutrition in general, but is still struggling with her variety as she feels she is set in what she is eating. Encouraged her to try a new fruit/vegetable or seasonal fruit/vegetable when she goes food shopping next. Another barrier is that when she is too busy to eat she will pick at whatever she can find for lunch - discussed some healthy snack options to have on hand - she would prefer to not have eggs - greek yogurt, hummus, edamame (dont eat with synthroid).       Expected Outcome ST: eat a rainbow of fruits/vegetables, choose plant foods from different groups (beans/lentils, berries, cruciferous vegetables, leafy greens, etc), include seasonal  fruits/vegetables LT: maintain heart healthy habits, include a variety of foods                Nutrition Goals Discharge (Final Nutrition Goals Re-Evaluation):  Nutrition Goals Re-Evaluation - 07/28/22 0928       Goals   Nutrition Goal ST: eat a rainbow of fruits/vegetables, choose plant foods from different groups (beans/lentils, berries, cruciferous vegetables, leafy greens, etc), include seasonal fruits/vegetables LT: maintain heart healthy habits, include a variety of foods    Comment Avea feels she is doing well with her nutrition in general, but is still struggling with her variety as she feels she is set in what she is eating. Encouraged her to try a new fruit/vegetable or  seasonal fruit/vegetable when she goes food shopping next. Another barrier is that when she is too busy to eat she will pick at whatever she can find for lunch - discussed some healthy snack options to have on hand - she would prefer to not have eggs - greek yogurt, hummus, edamame (dont eat with synthroid).    Expected Outcome ST: eat a rainbow of fruits/vegetables, choose plant foods from different groups (beans/lentils, berries, cruciferous vegetables, leafy greens, etc), include seasonal fruits/vegetables LT: maintain heart healthy habits, include a variety of foods             Psychosocial: Target Goals: Acknowledge presence or absence of significant depression and/or stress, maximize coping skills, provide positive support system. Participant is able to verbalize types and ability to use techniques and skills needed for reducing stress and depression.   Education: Stress, Anxiety, and Depression - Group verbal and visual presentation to define topics covered.  Reviews how body is impacted by stress, anxiety, and depression.  Also discusses healthy ways to reduce stress and to treat/manage anxiety and depression.  Written material given at graduation. Flowsheet Row Cardiac Rehab from 08/18/2022 in [facility]  Cardiac and Pulmonary Rehab  Date 08/04/22  Educator [facility]  Instruction Review Code 1- [employer] Understanding       Education: Sleep Hygiene -Provides group verbal and written instruction about how sleep can affect your health.  Define sleep hygiene, discuss sleep cycles and impact of sleep habits. Review good sleep hygiene tips.    Initial Review & Psychosocial Screening:  Initial Psych Review & Screening - 06/17/22 1005       Initial Review   Current issues with Current Stress Concerns    Source of Stress Concerns Unable to participate in former interests or hobbies    Comments past couple of years not able to do what she normally does.      Family Dynamics   Good Support System? Yes   husband,  2 daughters one in [place] one in [place]     Barriers   Psychosocial barriers to participate in program There are no identifiable barriers or psychosocial needs.      Screening Interventions   Interventions Encouraged to exercise;Provide feedback about the scores to participant;To provide support and resources with identified psychosocial needs    Expected Outcomes Short Term goal: Utilizing psychosocial counselor, staff and physician to assist with identification of specific Stressors or current issues interfering with healing process. Setting desired goal for each stressor or current issue identified.;Long Term Goal: Stressors or current issues are controlled or eliminated.;Short Term goal: Identification and review with participant of any Quality of Life or Depression concerns found by scoring the questionnaire.;Long Term goal: The participant improves quality of Life and PHQ9 Scores as seen by post scores and/or verbalization of changes             Quality of Life Scores:   Quality of Life - 06/30/22 1245       Quality of Life   Select Quality of Life      Quality of Life Scores   Health/Function Pre 21.2 %    Socioeconomic Pre 22.56 %    Psych/Spiritual Pre 25.71 %    Family  Pre 27.6 %    GLOBAL Pre 23.33 %            Scores of 19 and below usually indicate a poorer quality of life in these areas.  A difference of  2-3 points is a  clinically meaningful difference.  A difference of 2-3 points in the total score of the Quality of Life Index has been associated with significant improvement in overall quality of life, self-image, physical symptoms, and general health in studies assessing change in quality of life.  PHQ-9: Review Flowsheet  More data exists      07/28/2022 06/30/2022 04/29/2020 01/10/2019 01/03/2019  Depression screen PHQ 2/9  Decreased Interest 0 0 0 0 0  Down, Depressed, Hopeless 1 0 0 0 0  PHQ - 2 Score 1 0 0 0 0  Altered sleeping 1 2 - - -  Tired, decreased energy 3 2 - - -  Change in appetite 0 0 - - -  Feeling bad or failure about yourself  0 0 - - -  Trouble concentrating 1 0 - - -  Moving slowly or fidgety/restless 0 2 - - -  Suicidal thoughts 0 0 - - -  PHQ-9 Score 6 6 - - -  Difficult doing work/chores Somewhat difficult Somewhat difficult - - -   Interpretation of Total Score  Total Score Depression Severity:  1-4 = Minimal depression, 5-9 = Mild depression, 10-14 = Moderate depression, 15-19 = Moderately severe depression, 20-27 = Severe depression   Psychosocial Evaluation and Intervention:  Psychosocial Evaluation - 06/17/22 1019       Psychosocial Evaluation & Interventions   Interventions Encouraged to exercise with the program and follow exercise prescription    Comments Etola has no barriers to attending the program. [facility] is ready to get started and work on her exercise stamina. SHe ahd her husband Michela Pitcher go to the mountains for the hot/humid summer months.  I found a program close to thier summer place and she will contact the [facility] program to see if they can accept her there. Her support is her 2 daughters ;one in [place] one in [place]; and her husband.    Expected Outcomes STG Chaise attends all scheduled sessions, she  will be able to progress with her exercise program. LTG Rheya continues with her progression after discharge    Continue Psychosocial Services  Follow up required by staff             Psychosocial Re-Evaluation:  Psychosocial Re-Evaluation     [place] Name 07/28/22 0945             Psychosocial Re-Evaluation   Current issues with Current Stress Concerns;Current Sleep Concerns       Comments Analisse reports that this past winter, two of her brothers passed away and this summer her other brother was diagnosed with cancer. Her older sister is still alive, but is 62 and has health issues at this time; she is grateful that she is still alive. She was supposed to be in the mountains this summer and is sad that she wasn't able to go. She relies on her husband and daughter for support; her daughter lives in [place], [place]. 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 She enjoys word searches to reduce stress and will go to church to try and look at the positive in life.       Expected Outcomes ST: continue to attend rehab for mental health boost LT: maintain positive attitude       Interventions Encouraged to attend Cardiac Rehabilitation for the exercise                Psychosocial Discharge (Final  Psychosocial Re-Evaluation):  Psychosocial Re-Evaluation - 07/28/22 0945       Psychosocial Re-Evaluation   Current issues with Current Stress Concerns;Current Sleep Concerns    Comments Aslyn reports that this past winter, two of her brothers passed away and this summer her other brother was diagnosed with cancer. Her older sister is still alive, but is 1 and has health issues at this time; she is grateful that she is still alive. She was supposed [place] be [place] the mountains this [place] and is sad that she wasn't able to go. She relies on her husband and daughter for support; her daughter lives in [place], [place]. Since her stroke she is no longer  able to volunteer at the [employer] as she is not strong enough. She enjoys word searches to reduce stress and will go to church to try and look at the positive in life.    Expected Outcomes ST: continue to attend rehab for mental health boost LT: maintain positive attitude    Interventions Encouraged to attend Cardiac Rehabilitation for the exercise             Vocational Rehabilitation: Provide vocational rehab assistance to qualifying candidates.   Vocational Rehab Evaluation & Intervention:  Vocational Rehab - 06/17/22 1037       Initial Vocational Rehab Evaluation & Intervention   Assessment shows need for Vocational Rehabilitation No      Vocational Rehab Re-Evaulation   Comments retired      Discharge Vocational Rehab   Discharge Vocational Rehabilitation retired             Education: Education Goals: Education classes will be provided on a variety of topics geared toward better understanding of heart health and risk factor modification. Participant will state understanding/return demonstration of topics presented as noted by education test scores.  Learning Barriers/Preferences:   General Cardiac Education Topics:  AED/CPR: - Group verbal and written instruction with the use of models to demonstrate the basic use of the AED with the basic ABC's of resuscitation.   Anatomy and Cardiac Procedures: - Group verbal and visual presentation and models provide information about basic cardiac anatomy and function. Reviews the testing methods done to diagnose heart disease and the outcomes of the test results. Describes the treatment choices: Medical Management, Angioplasty, or Coronary Bypass Surgery for treating various heart conditions including Myocardial Infarction, Angina, Valve Disease, and Cardiac Arrhythmias.  Written material given at graduation. Flowsheet Row Cardiac Rehab from 08/18/2022 in [facility] Cardiac [facility] Pulmonary Rehab  Education need identified  06/30/22       Medication Safety: - Group verbal and visual instruction to review commonly prescribed medications for heart and lung disease. Reviews the medication, class of the drug, and side effects. Includes the steps to properly store meds and maintain the prescription regimen.  Written material given at graduation. Flowsheet Row Cardiac Rehab from 08/18/2022 in [facility] Cardiac and Pulmonary Rehab  Date 07/14/22  Educator SB  Instruction Review Code 1- Verbalizes Understanding       Intimacy: - Group verbal instruction through game format to discuss how heart and lung disease can affect sexual intimacy. Written material given at graduation.. Flowsheet Row Cardiac Rehab from 08/18/2022 in [facility] Cardiac and Pulmonary Rehab  Date 08/18/22  Educator [facility]  Instruction Review Code 1- Verbalizes Understanding       Know Your Numbers and Heart Failure: - Group verbal and visual instruction to discuss disease risk factors for cardiac and pulmonary disease and treatment options.  Reviews associated critical values for Overweight/Obesity, Hypertension, Cholesterol, and Diabetes.  Discusses basics of heart failure: signs/symptoms and treatments.  Introduces Heart Failure Zone chart for action plan for heart failure.  Written material given at graduation. Flowsheet Row Cardiac Rehab from 08/18/2022 in [facility] Cardiac and Pulmonary Rehab  Education need identified 06/30/22  Date 07/21/22  Educator SB  Instruction Review Code 1- Verbalizes Understanding       Infection Prevention: - Provides verbal and written material to individual with discussion of infection control including proper hand washing and proper equipment cleaning during exercise session. Flowsheet Row Cardiac Rehab from 08/18/2022 in [facility] Cardiac and Pulmonary Rehab  Education need identified 06/30/22  Date 06/30/22  Educator [place]  Instruction Review Code 1- Verbalizes Understanding       Falls Prevention: - Provides verbal and  written material to individual with discussion of falls prevention and safety. Flowsheet Row Cardiac Rehab from 06/17/2022 in [facility] Cardiac and Pulmonary Rehab  Date 06/17/22  Educator SB  Instruction Review Code 1- Verbalizes Understanding       Other: -Provides group and verbal instruction on various topics (see comments)   Knowledge Questionnaire Score:  Knowledge Questionnaire Score - 06/30/22 1245       Knowledge Questionnaire Score   Pre Score 23/26             Core Components/Risk Factors/Patient Goals at Admission:  Personal Goals and Risk Factors at Admission - 06/30/22 1305       Core Components/Risk Factors/Patient Goals on Admission    Weight Management Yes;Weight Maintenance    Intervention Weight Management: Provide education and appropriate resources to help participant work on and attain dietary goals.;Weight Management: Develop a combined nutrition and exercise program designed to reach desired caloric intake, while maintaining appropriate intake of nutrient and fiber, sodium and fats, and appropriate energy expenditure required for the weight goal.    Admit Weight 117 lb (53.1 kg)    Goal Weight: Short Term 117 lb (53.1 kg)    Goal Weight: Long Term 117 lb (53.1 kg)    Expected Outcomes Short Term: Continue to assess and modify interventions until short term weight is achieved;Long Term: Adherence to nutrition and physical activity/exercise program aimed toward attainment of established weight goal;Weight Maintenance: Understanding of the daily nutrition guidelines, which includes 25-35% calories from fat, 7% or less cal from saturated fats, less than 246m cholesterol, less than 1.5gm of sodium, & 5 or more servings of fruits and vegetables daily;Understanding recommendations for meals to include 15-35% energy as protein, 25-35% energy from fat, 35-60% energy from carbohydrates, less than 2068mof dietary cholesterol, 20-35 gm of total fiber daily;Understanding of  distribution of calorie intake throughout the day with the consumption of 4-5 meals/snacks    Hypertension Yes    Intervention Provide education on lifestyle modifcations including regular physical activity/exercise, weight management, moderate sodium restriction and increased consumption of fresh fruit, vegetables, and low fat dairy, alcohol moderation, and smoking cessation.;Monitor prescription use compliance.    Expected Outcomes Short Term: Continued assessment and intervention until BP is < 140/906mG in hypertensive participants. < 130/65m73m in hypertensive participants with diabetes, heart failure or chronic kidney disease.;Long Term: Maintenance of blood pressure at goal levels.    Lipids Yes    Intervention Provide education and support for participant on nutrition & aerobic/resistive exercise along with prescribed medications to achieve LDL <70mg3mL >40mg.63mExpected Outcomes Short Term: Participant states understanding of desired cholesterol values and  is compliant with medications prescribed. Participant is following exercise prescription and nutrition guidelines.;Long Term: Cholesterol controlled with medications as prescribed, with individualized exercise RX and with personalized nutrition plan. Value goals: LDL < 34m, HDL > 40 mg.             Education:Diabetes - Individual verbal and written instruction to review signs/symptoms of diabetes, desired ranges of glucose level fasting, after meals and with exercise. Acknowledge that pre and post exercise glucose checks will be done for 3 sessions at entry of program.   Core Components/Risk Factors/Patient Goals Review:   Goals and Risk Factor Review     Row Name 07/28/22 0924             Core Components/Risk Factors/Patient Goals Review   Personal Goals Review Weight Management/Obesity;Lipids;Hypertension       Review She has pain in her left leg from a stroke she had which caused her to not sleep well last night -  this will happen sometimes. They only time she sleeps well when she is working all day and then is so tired from the day. She reports that her weight has been stbale since starting rehab. She checks her BP at home every monring - if it is below 1462systolic she is not supposed to take her BP medications. 110s-120s/70s-80s; today in rehab her BP was 138/80. She is taking all of her medications as prescribed with no issues at this time. She reports after rehab in the afternoon she will get aching in her chest that is not painful, but will scare her - she reports it goes away after about a minute and is relieved with pressure; encouraged her to let her MD know.       Expected Outcomes ST: make MD aware of chest ache LT: manage risk factors                Core Components/Risk Factors/Patient Goals at Discharge (Final Review):   Goals and Risk Factor Review - 07/28/22 0924       Core Components/Risk Factors/Patient Goals Review   Personal Goals Review Weight Management/Obesity;Lipids;Hypertension    Review She has pain in her left leg from a stroke she had which caused her to not sleep well last night - this will happen sometimes. They only time she sleeps well when she is working all day and then is so tired from the day. She reports that her weight has been stbale since starting rehab. She checks her BP at home every monring - if it is below 1703systolic she is not supposed to take her BP medications. 110s-120s/70s-80s; today in rehab her BP was 138/80. She is taking all of her medications as prescribed with no issues at this time. She reports after rehab in the afternoon she will get aching in her chest that is not painful, but will scare her - she reports it goes away after about a minute and is relieved with pressure; encouraged her to let her MD know.    Expected Outcomes ST: make MD aware of chest ache LT: manage risk factors             ITP Comments:  ITP Comments     Row Name 06/17/22  1028 06/30/22 1233 06/30/22 1235 07/02/22 0956 07/20/22 0937   ITP Comments Virtual orientation call completed today. shehas an appointment on Date: 06/30/2022  for EP eval and gym Orientation.  [facility] . Completed  6MWT and gym orientation. Initial ITP created and sent for review to Dr. Emily Filbert, Medical Director. EKG  strips sent to Dr. Harrell Gave First full day of exercise!  Patient was oriented to gym and equipment including functions, settings, policies, and procedures.  Patient's individual exercise prescription and treatment plan were reviewed.  All starting workloads were established based on the results of the 6 minute walk test done at initial orientation visit.  The plan for exercise progression was also introduced and progression will be customized based on patient's performance and goals. Completed initial RD consultation    [place] Name 07/21/22 1005 08/18/22 1409 08/25/22 1143 09/06/22 1359 09/09/22 1058   ITP Comments 30 Day review completed. Medical Director ITP review done, changes made as directed, and signed approval by Medical Director. 30 Day review completed. Medical Director ITP review done, changes made as directed, and signed approval by Medical Director. Elissia stated she has chest pressure while exercising. It resolved with rest. She stated that the same symptom occurred the last day she was here, after she got home. 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  She was advised to call her cardiology physician and let them know of the symptoms occurring. She has an appt at end of the month, advised her to see if she could move appt up to this week. Also advised her if the symptoms occur again and do not resolve to call 911. She verbalized understanding. Delane has been out since her chest pain episode.  She has had a stress test and was advised to wait to return until stress test reviewed.  Her  next follow up is scheduled for Friday 9/29. Patient will be out of rehab until stress test results are back and clearance is provided.    [place] Name 09/13/22 0736           ITP Comments Patient called and would like to discharge from program. She is still having a difficult time with her breathing. Her physician encouraged her to step away from rehab for now.  We will discharge her at this time.                Comments: Discharge ITP

## 2022-09-13 NOTE — Progress Notes (Signed)
Discharge Progress Report  Patient Details  Name: Shannon [name] MRN: [id number] Date of Birth: 1946-11-21 Referring Provider:   Flowsheet Row Cardiac Rehab from 06/30/2022 in [facility]  Referring Provider Shannon Dresser MD        Number of Visits: 31  Reason for Discharge:  Early Exit:  medical issues  Smoking History:  Social History   Tobacco Use  Smoking Status Never  Smokeless Tobacco Never    Diagnosis:  Status post coronary artery stent placement  Initial Exercise Prescription:  Initial Exercise Prescription - 06/30/22 1500       Date of Initial Exercise RX and Referring Provider   Date 06/30/22    Referring Provider Shannon Dresser MD      Oxygen   Maintain Oxygen Saturation 88% or higher      Treadmill   MPH 1.9    Grade 0.5    Minutes 15    METs 2.59      NuStep   Level 1    SPM 80    Minutes 15    METs 2.6      REL-XR   Level 1    Speed 50    Minutes 15    METs 2.6      T5 Nustep   Level 1    SPM 80    Minutes 15    METs 2.6      Prescription Details   Frequency (times per week) 3    Duration Progress to 30 minutes of continuous aerobic without signs/symptoms of physical distress      Intensity   THRR 40-80% of Max Heartrate 100 - 129    Ratings of Perceived Exertion 11-13    Perceived Dyspnea 0-4      Progression   Progression Continue to progress workloads to maintain intensity without signs/symptoms of physical distress.      Resistance Training   Training Prescription Yes    Weight 2 lb    Reps 10-15             Discharge Exercise Prescription (Final Exercise Prescription Changes):  Exercise Prescription Changes - 08/26/22 1000       Response to Exercise   Blood Pressure (Admit) 114/60    Blood Pressure (Exit) 112/64    Heart Rate (Admit) 82 bpm    Heart Rate (Exercise) 109 bpm    Heart Rate (Exit) 87 bpm    Rating of Perceived Exertion (Exercise) 14    Symptoms none     Duration Continue with 30 min of aerobic exercise without signs/symptoms of physical distress.    Intensity THRR unchanged      Progression   Progression Continue to progress workloads to maintain intensity without signs/symptoms of physical distress.    Average METs 2.97      Resistance Training   Training Prescription Yes    Weight 3 lb    Reps 10-15      Interval Training   Interval Training No      Treadmill   MPH 2.3    Grade 1    Minutes 15    METs 3.08      REL-XR   Level 1    Minutes 15      T5 Nustep   Level 1    Minutes 15      Home Exercise Plan   Plans to continue exercise at Home (comment)   Walking at Home (Treadmill)   Frequency Add 2 additional  days to program exercise sessions.    Initial Home Exercises Provided 07/21/22      Oxygen   Maintain Oxygen Saturation 88% or higher             Functional Capacity:  6 Minute Walk     Row Name 06/30/22 1303         6 Minute Walk   Phase Initial     Distance 1140 feet     Walk Time 6 minutes     # of Rest Breaks 0     MPH 2.15     METS 2.63     RPE 10     Perceived Dyspnea  0     VO2 Peak 9.23     Symptoms Yes (comment)     Comments Fatigue     Resting HR 71 bpm     Resting BP 118/74     Resting Oxygen Saturation  99 %     Exercise Oxygen Saturation  during 6 min walk 95 %     Max Ex. HR 83 bpm     Max Ex. BP 144/76     2 Minute Post BP 124/74              Psychological, QOL, Others - Outcomes: PHQ 2/9:    07/28/2022    9:41 AM 06/30/2022   12:39 PM 04/29/2020   10:02 AM 01/10/2019   10:45 AM 01/03/2019    1:25 PM  Depression screen PHQ 2/9  Decreased Interest 0 0 0 0 0  Down, Depressed, Hopeless 1 0 0 0 0  PHQ - 2 Score 1 0 0 0 0  Altered sleeping 1 2     Tired, decreased energy 3 2     Change in appetite 0 0     Feeling bad or failure about yourself  0 0     Trouble concentrating 1 0     Moving slowly or fidgety/restless 0 2     Suicidal thoughts 0 0     PHQ-9 Score 6  6     Difficult doing work/chores Somewhat difficult Somewhat difficult       Quality of Life:  Quality of Life - 06/30/22 1245       Quality of Life   Select Quality of Life      Quality of Life Scores   Health/Function Pre 21.2 %    Socioeconomic Pre 22.56 %    Psych/Spiritual Pre 25.71 %    Family Pre 27.6 %    GLOBAL Pre 23.33 %             Nutrition & Weight - Outcomes:  Pre Biometrics - 06/30/22 1243       Pre Biometrics   Height 5' 5.5" (1.664 m)    Weight 117 lb 4.8 oz (53.2 kg)    BMI (Calculated) 19.22    Single Leg Stand 3.5 seconds              Nutrition:  Nutrition Therapy & Goals - 07/20/22 0923       Nutrition Therapy   Diet Heart healthy, low Na    Drug/Food Interactions Statins/Certain Fruits    Protein (specify units) 60-65g    Fiber 25 grams    Whole Grain Foods 3 servings    Saturated Fats 12 max. grams    Fruits and Vegetables 8 servings/day    Sodium 2 grams      Personal Nutrition Goals  Nutrition Goal ST: eat a rainbow of fruits/vegetables, choose plant foods from different groups (beans/lentils, berries, cruciferous vegetables, leafy greens, etc), include seasonal fruits/vegetables LT: maintain heart healthy habits, include a variety of foods    Comments 76 y.o. F admitted to cardiac rehab s/p coronary artery stent placement. PMHx includes HTN, TIA, CAD, hypothyroidism. Relevant medications includes lipitor, CoQ10, synthroid, MVI, vit E, skin cancer, GERD. PYP Score: 82. Vegetables & Fruits 8/12. Breads, Grains & Cereals 9/12. Red & Processed Meat 12/12. Poultry 2/2. Fish & Shellfish 3/4. Beans, Nuts & Seeds 3/4. Milk & Dairy Foods 4/6. Toppings, Oils, Seasonings & Salt 20/20. Sweets, Snacks & Restaurant Food 11/14. Beverages 10/10. Shannon [name] reports that 3 years ago after her stroke she has changed her diet. B: oatmeal (rolled oats), cranberry juice (cranberry juice cocktail) - small glass, 1 cup of coffee  L: (largest meal) meat,  potatoes or sweet potato, non starcy vegetables, skim milk.  S: fruit or nuts D: 4 unsalted Shannon [name] crackers with peanut butter and orange, skim milk. Drinks: water. She uses canola oil for cooking and she does not salt her food. She is mindful of her food labels and is mindful of trans fat, saturated fat, and sodium. Reviewed general heart healthy eating; she had gone to nutrition education last week and reports that it was "straight foward" and does not have any further questions at this time. Encouraged her to include more variety.      Intervention Plan   Intervention Prescribe, educate and counsel regarding individualized specific dietary modifications aiming towards targeted core components such as weight, hypertension, lipid management, diabetes, heart failure and other comorbidities.    Expected Outcomes Short Term Goal: Understand basic principles of dietary content, such as calories, fat, sodium, cholesterol and nutrients.;Short Term Goal: A plan has been developed with personal nutrition goals set during dietitian appointment.;Long Term Goal: Adherence to prescribed nutrition plan.            Goals reviewed with patient; copy given to patient.

## 2022-09-15 ENCOUNTER — Encounter: Payer: Self-pay | Admitting: *Deleted

## 2022-09-15 DIAGNOSIS — Z955 Presence of coronary angioplasty implant and graft: Secondary | ICD-10-CM

## 2022-09-15 NOTE — Progress Notes (Signed)
Cardiac Individual Treatment Plan  Patient Details  Name: Shannon [name] MRN: [id number] Date of Birth: [birth year] Referring Provider:   Flowsheet Row Cardiac Rehab from 06/30/2022 in [facility] Cardiac and Pulmonary Rehab  Referring Provider Buford Dresser MD       Initial Encounter Date:  Flowsheet Row Cardiac Rehab from 06/30/2022 in [facility] Cardiac and Pulmonary Rehab  Date 06/30/22       Visit Diagnosis: Status post coronary artery stent placement  Patient's Home Medications on Admission:  Current Outpatient Medications:    acetaminophen (TYLENOL) 325 MG tablet, Take 325 mg by mouth every 6 (six) hours as needed for headache. , Disp: , Rfl:    aspirin EC 81 MG EC tablet, Take 1 tablet (81 mg total) by mouth daily., Disp: 30 tablet, Rfl: 0   atorvastatin (LIPITOR) 40 MG tablet, Take 1 tablet (40 mg total) by mouth daily at 6 PM., Disp: 30 tablet, Rfl: 0   Calcium Carb-Cholecalciferol (CALCIUM 600 + D PO), Take 300-600 mg by mouth See admin instructions. Take 600 mg after breakfast, 600 mg after lunch, and 300 mg after dinner, Disp: , Rfl:    Coenzyme Q10 50 MG CAPS, Take 50 mg by mouth daily. , Disp: , Rfl:    levothyroxine (SYNTHROID) 75 MCG tablet, Take 75 mcg by mouth every Tuesday, Thursday, Saturday, and Sunday. , Disp: , Rfl:    levothyroxine (SYNTHROID, LEVOTHROID) 50 MCG tablet, Take 50 mcg by mouth every Monday, Wednesday, and Friday. , Disp: , Rfl:    loratadine (CLARITIN) 10 MG tablet, Take 10 mg by mouth daily., Disp: , Rfl:    metoprolol succinate (TOPROL-XL) 25 MG 24 hr tablet, Take 0.5 tablets (12.5 mg total) by mouth daily., Disp: 45 tablet, Rfl: 3   Multiple Vitamin (MULTIVITAMIN) capsule, Take 1 capsule by mouth daily. Senior, Disp: , Rfl:    nitroGLYCERIN (NITROSTAT) 0.4 MG SL tablet, Place 1 tablet (0.4 mg total) under the tongue every 5 (five) minutes as needed for chest pain., Disp: 25 tablet, Rfl: 3   Polyvinyl Alcohol-Povidone PF (REFRESH) 1.4-0.6 % SOLN, Apply  1 drop to eye as needed (dry eye). Both eyes, Disp: , Rfl:    Propylene Glycol (SYSTANE BALANCE OP), Apply 1 drop to eye as needed (dry eye). Both eyes., Disp: , Rfl:    ticagrelor (BRILINTA) 90 MG TABS tablet, Take 1 tablet (90 mg total) by mouth 2 (two) times daily., Disp: 180 tablet, Rfl: 3   vitamin E 400 UNIT capsule, Take 400 Units by mouth every other day. 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   Arthritis    Cancer ([place])    skin - squamous cell   Complication of anesthesia    " I had difficulty coming out of it"   Dyspnea    with exertion   Family history of adverse reaction to anesthesia    " My daughter has a problem coming ou to it"   GERD (gastroesophageal reflux disease)    Headache    migraines in the past   Hypertension    Hypothyroidism    Stroke ([place]) 11/2018   some weakness on right side    SVT (supraventricular tachycardia) ([facility])    Thyroid disease     Tobacco Use: Social History   Tobacco Use  Smoking Status Never  Smokeless Tobacco Never    Labs: Review Flowsheet  Latest Ref Rng & Units 12/09/2018 12/24/2018 01/04/2019 09/23/2020  Labs for ITP Cardiac and Pulmonary Rehab  Cholestrol 0 - 200 mg/dL 199  - - -  LDL (calc) 0 - 99 mg/dL 132  - - -  HDL-C >40 mg/dL 45  - - -  Trlycerides <150 mg/dL 110  - - -  Hemoglobin A1c 4.8 - 5.6 % 5.4  - - -  PH, Arterial 7.350 - 7.450 - - 7.415  -  PCO2 arterial 32.0 - 48.0 mmHg - - 33.8  -  Bicarbonate 20.0 - 28.0 mmol/L - - 21.7  -  TCO2 22 - 32 mmol/L - '24  23  23   ' Acid-base deficit 0.0 - 2.0 mmol/L - - 3.0  -  O2 Saturation % - - 100.0  -     Exercise Target Goals: Exercise Program Goal: Individual exercise prescription set using results from initial 6 min walk test and THRR while considering  patient's activity barriers and safety.   Exercise Prescription Goal: Initial exercise prescription builds to 30-45  minutes a day of aerobic activity, 2-3 days per week.  Home exercise guidelines will be given to patient during program as part of exercise prescription that the participant will acknowledge.   Education: Aerobic Exercise: - Group verbal and visual presentation on the components of exercise prescription. Introduces F.I.T.T principle from ACSM for exercise prescriptions.  Reviews F.I.T.T. principles of aerobic exercise including progression. Written material given at graduation. Flowsheet Row Cardiac Rehab from 08/18/2022 in [facility] Cardiac and Pulmonary Rehab  Date 08/18/22  Educator [facility]  Instruction Review Code 1- Verbalizes Understanding       Education: Resistance Exercise: - Group verbal and visual presentation on the components of exercise prescription. Introduces F.I.T.T principle from ACSM for exercise prescriptions  Reviews F.I.T.T. principles of resistance exercise including progression. Written material given at graduation.    Education: Exercise & Equipment Safety: - Individual verbal instruction and demonstration of equipment use and safety with use of the equipment. Flowsheet Row Cardiac Rehab from 08/18/2022 in [facility] Cardiac and Pulmonary Rehab  Education need identified 06/30/22  Date 06/30/22  Educator [place]  Instruction Review Code 1- Verbalizes Understanding       Education: Exercise Physiology & General Exercise Guidelines: - Group verbal and written instruction with models to review the exercise physiology of the cardiovascular system and associated critical values. Provides general exercise guidelines with specific guidelines to those with heart or lung disease.  Flowsheet Row Cardiac Rehab from 08/18/2022 in [facility] Cardiac and Pulmonary Rehab  Date 08/11/22  Educator [employer]  Instruction Review Code 1- Verbalizes Understanding       Education: Flexibility, Balance, Mind/Body Relaxation: - Group verbal and visual presentation with interactive activity on the components of  exercise prescription. Introduces F.I.T.T principle from ACSM for exercise prescriptions. Reviews F.I.T.T. principles of flexibility and balance exercise training including progression. Also discusses the mind body connection.  Reviews various relaxation techniques to help reduce and manage stress (i.e. Deep breathing, progressive muscle relaxation, and visualization). Balance handout provided to take home. Written material given at graduation.   Activity Barriers & Risk Stratification:   6 Minute Walk:   Oxygen Initial Assessment:   Oxygen Re-Evaluation:   Oxygen Discharge (Final Oxygen Re-Evaluation):   Initial Exercise Prescription:   Perform Capillary Blood Glucose checks as needed.  Exercise Prescription Changes:   Exercise Prescription Changes     Row Name 07/21/22 0900 07/28/22 1300 08/10/22 1500 08/26/22 1000  Response to Exercise   Blood Pressure (Admit) -- 108/62 138/74 114/60    Blood Pressure (Exercise) -- 172/84 -- --    Blood Pressure (Exit) -- 132/70 124/62 112/64    Heart Rate (Admit) -- 75 bpm 73 bpm 82 bpm    Heart Rate (Exercise) -- 100 bpm 97 bpm 109 bpm    Heart Rate (Exit) -- 79 bpm 81 bpm 87 bpm    Rating of Perceived Exertion (Exercise) -- '14 13 14    ' Symptoms -- none none none    Duration -- Progress to 30 minutes of  aerobic without signs/symptoms of physical distress Continue with 30 min of aerobic exercise without signs/symptoms of physical distress. Continue with 30 min of aerobic exercise without signs/symptoms of physical distress.    Intensity -- THRR unchanged THRR unchanged THRR unchanged      Progression   Progression -- Continue to progress workloads to maintain intensity without signs/symptoms of physical distress. Continue to progress workloads to maintain intensity without signs/symptoms of physical distress. Continue to progress workloads to maintain intensity without signs/symptoms of physical distress.    Average METs -- 2.6  2.95 2.97      Resistance Training   Training Prescription -- Yes Yes Yes    Weight -- 3 lb 3 lb 3 lb    Reps -- 10-15 10-15 10-15      Interval Training   Interval Training -- No No No      Treadmill   MPH -- 2 2.2 2.3    Grade -- 0.5 0.5 1    Minutes -- '15 15 15    ' METs -- 2.67 2.84 3.08      Recumbant Bike   Level -- 1.5 3 --    Watts -- -- 22 --    Minutes -- 15 15 --    METs -- 3.47 3.47 --      NuStep   Level -- 2 4 --    Minutes -- 15 15 --    METs -- 1.8 2.2 --      REL-XR   Level -- 2 -- 1    Minutes -- 15 -- 15    METs -- 1.9 -- --      T5 Nustep   Level -- -- -- 1    Minutes -- -- -- 15      Track   Laps -- 25 -- --    Minutes -- 15 -- --    METs -- 2.36 -- --      Home Exercise Plan   Plans to continue exercise at Home (comment)  Walking at Home (Treadmill) Home (comment)  Walking at Home (Treadmill) Home (comment)  Walking at Home (Treadmill) Home (comment)  Walking at Home (Treadmill)    Frequency Add 2 additional days to program exercise sessions. Add 2 additional days to program exercise sessions. Add 2 additional days to program exercise sessions. Add 2 additional days to program exercise sessions.    Initial Home Exercises Provided 07/21/22 07/21/22 07/21/22 07/21/22      Oxygen   Maintain Oxygen Saturation 88% or higher 88% or higher 88% or higher 88% or higher             Exercise Comments:   Exercise Comments     Row Name 08/25/22 1144           Exercise Comments Analissa stated she has chest pressure while exercising. It resolved with rest. She stated that the same  symptom occurred the last day she was here, after she got home. 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  She was advised to call her cardiology physician and let them know of the symptoms occurring. She has an appt at end of the month, advised her to see if she could move appt up to this week. Also advised her if the symptoms  occur again and do not resolve to call 911. She verbalized understanding.                Exercise Goals and Review:   Exercise Goals Re-Evaluation :  Exercise Goals Re-Evaluation     Row Name 07/21/22 1000 07/28/22 0930 07/28/22 1310 08/10/22 1507 08/26/22 1006     Exercise Goal Re-Evaluation   Exercise Goals Review -- Increase Physical Activity;Increase Strength and Stamina;Understanding of Exercise Prescription Increase Physical Activity;Increase Strength and Stamina;Understanding of Exercise Prescription Increase Physical Activity;Increase Strength and Stamina;Understanding of Exercise Prescription Increase Physical Activity;Increase Strength and Stamina;Understanding of Exercise Prescription   Comments Reviewed home exercise with pt today.  Pt plans to walk at home both on the treadmill and outside for exercise.  Reviewed THR, pulse, RPE, sign and symptoms, pulse oximetery and when to call 911 or MD.  Also discussed weather considerations and indoor options.  Pt voiced understanding. She walks for about 30 minutes - she tries to increase her distance every time (1-1.5 miles), she would like to get to 2 miles; she walks outside so this can vary. Encouraged to use treadmill at home when it is too hot outside. Jara is doing well in rehab. She recently increased her overall average MET level to 2.6 METs. She also improved from 2 lb to 3 lb hand weights for resistance training. She increased her workload on the treadmill as well, to a speed of 2 mph and an incline of 0.5%. We will continue to monitor her progress in the program. Breniya continues to do well in rehab. She has increased to 2.2 speed on the treadmill. She is also working at level 3 on the bike. We hope to see her watts improve overtime. RPEs are staying in appropriate range. Will continue to monitor. Halyn is doing well in rehab. She recently increased her overall average MET level to 2.97 METs. She also increased her workload on the  treadmill to a speed of 2.3 mph and an incline of 1%. She is looking to improve on her post 6MWT soon as well. We will continue to monitor her progress.   Expected Outcomes Short: Continue to walk at home on off days. Long: Continue to exercise independently. Short: Continue to walk at home on off days. Long: Continue to exercise independently. Short: Continue to increase workload on treadmill. Long: Continue to to increase strength and stamina. Short: Increase watts on recumbent bike Long: Continue to increase overall MET level Short: Improve on post 6MWT. Long: Continue to increase strength and stamina.    [place] Name 09/06/22 1403             Exercise Goal Re-Evaluation   Comments Out since last review with chest pain work up.  Follow up scheduled for 09/10/22.                Discharge Exercise Prescription (Final Exercise Prescription Changes):  Exercise Prescription Changes - 08/26/22 1000       Response to Exercise   Blood Pressure (Admit) 114/60    Blood Pressure (Exit) 112/64  Heart Rate (Admit) 82 bpm    Heart Rate (Exercise) 109 bpm    Heart Rate (Exit) 87 bpm    Rating of Perceived Exertion (Exercise) 14    Symptoms none    Duration Continue with 30 min of aerobic exercise without signs/symptoms of physical distress.    Intensity THRR unchanged      Progression   Progression Continue to progress workloads to maintain intensity without signs/symptoms of physical distress.    Average METs 2.97      Resistance Training   Training Prescription Yes    Weight 3 lb    Reps 10-15      Interval Training   Interval Training No      Treadmill   MPH 2.3    Grade 1    Minutes 15    METs 3.08      REL-XR   Level 1    Minutes 15      T5 Nustep   Level 1    Minutes 15      Home Exercise Plan   Plans to continue exercise at Home (comment)   Walking at Home (Treadmill)   Frequency Add 2 additional days to program exercise sessions.    Initial Home Exercises  Provided 07/21/22      Oxygen   Maintain Oxygen Saturation 88% or higher             Nutrition:  Target Goals: Understanding of nutrition guidelines, daily intake of sodium <1546m, cholesterol <2033m calories 30% from fat and 7% or less from saturated fats, daily to have 5 or more servings of fruits and vegetables.  Education: All About Nutrition: -Group instruction provided by verbal, written material, interactive activities, discussions, models, and posters to present general guidelines for heart healthy nutrition including fat, fiber, MyPlate, the role of sodium in heart healthy nutrition, utilization of the nutrition label, and utilization of this knowledge for meal planning. Follow up email sent as well. Written material given at graduation. [facility]  Education need identified 06/30/22  Date 07/07/22  Educator [place] Review Code 1- Verbalizes Understanding       Biometrics:    Nutrition Therapy Plan and Nutrition Goals:  Nutrition Therapy & Goals - 07/20/22 0923       Nutrition Therapy   Diet Heart healthy, low Na    Drug/Food Interactions Statins/Certain Fruits    Protein (specify units) 60-65g    Fiber 25 grams    Whole Grain Foods 3 servings    Saturated Fats 12 max. grams    Fruits and Vegetables 8 servings/day    Sodium 2 grams      Personal Nutrition Goals   Nutrition Goal ST: eat a rainbow of fruits/vegetables, choose plant foods from different groups (beans/lentils, berries, cruciferous vegetables, leafy greens, etc), include seasonal fruits/vegetables LT: maintain heart healthy habits, include a variety of foods    Comments [age].o. F admitted to cardiac rehab s/p coronary artery stent placement. PMHx includes HTN, TIA, CAD, hypothyroidism. Relevant medications includes lipitor, CoQ10, synthroid, MVI, vit E, skin cancer, GERD. PYP Score: 82. Vegetables & Fruits 8/12. Breads,  Grains & Cereals 9/12. Red & Processed Meat 12/12. Poultry 2/2. Fish & Shellfish 3/4. Beans, Nuts & Seeds 3/4. Milk & Dairy Foods 4/6. Toppings, Oils, Seasonings & Salt 20/20. Sweets, Snacks & Restaurant Food 11/14. Beverages 10/10. CaMarkeyaeports that 3 years ago after her stroke she has changed  her diet. B: oatmeal (rolled oats), cranberry juice (cranberry juice cocktail) - small glass, 1 cup of coffee  L: (largest meal) meat, potatoes or sweet potato, non starcy vegetables, skim milk.  S: fruit or nuts D: 4 unsalted Gilday crackers with peanut butter and orange, skim milk. Drinks: water. She uses canola oil for cooking and she does not salt her food. She is mindful of her food labels and is mindful of trans fat, saturated fat, and sodium. Reviewed general heart healthy eating; she had gone to nutrition education last week and reports that it was "straight foward" and does not have any further questions at this time. Encouraged her to include more variety.      Intervention Plan   Intervention Prescribe, educate and counsel regarding individualized specific dietary modifications aiming towards targeted core components such as weight, hypertension, lipid management, diabetes, heart failure and other comorbidities.    Expected Outcomes Short Term Goal: Understand basic principles of dietary content, such as calories, fat, sodium, cholesterol and nutrients.;Short Term Goal: A plan has been developed with personal nutrition goals set during dietitian appointment.;Long Term Goal: Adherence to prescribed nutrition plan.             Nutrition Assessments:  MEDIFICTS Score Key: ?70 Need to make dietary changes  40-70 Heart Healthy Diet ? 40 Therapeutic Level Cholesterol Diet  Flowsheet Row Cardiac Rehab from 06/30/2022 in [facility] Cardiac and Pulmonary Rehab  Picture Your Plate Total Score on Admission 82      Picture Your Plate Scores: <62 Unhealthy dietary pattern with much room for improvement. 41-50  Dietary pattern unlikely to meet recommendations for good health and room for improvement. 51-60 More healthful dietary pattern, with some room for improvement.  >60 Healthy dietary pattern, although there may be some specific behaviors that could be improved.    Nutrition Goals Re-Evaluation:  Nutrition Goals Re-Evaluation     [place] Name 07/28/22 5638             Goals   Nutrition Goal ST: eat a rainbow of fruits/vegetables, choose plant foods from different groups (beans/lentils, berries, cruciferous vegetables, leafy greens, etc), include seasonal fruits/vegetables LT: maintain heart healthy habits, include a variety of foods       Comment Ashlan feels she is doing well with her nutrition in general, but is still struggling with her variety as she feels she is set in what she is eating. Encouraged her to try a new fruit/vegetable or seasonal fruit/vegetable when she goes food shopping next. Another barrier is that when she is too busy to eat she will pick at whatever she can find for lunch - discussed some healthy snack options to have on hand - she would prefer to not have eggs - greek yogurt, hummus, edamame (dont eat with synthroid).       Expected Outcome ST: eat a rainbow of fruits/vegetables, choose plant foods from different groups (beans/lentils, berries, cruciferous vegetables, leafy greens, etc), include seasonal fruits/vegetables LT: maintain heart healthy habits, include a variety of foods                Nutrition Goals Discharge (Final Nutrition Goals Re-Evaluation):  Nutrition Goals Re-Evaluation - 07/28/22 0928       Goals   Nutrition Goal ST: eat a rainbow of fruits/vegetables, choose plant foods from different groups (beans/lentils, berries, cruciferous vegetables, leafy greens, etc), include seasonal fruits/vegetables LT: maintain heart healthy habits, include a variety of foods    Comment Kjirsten feels she is  doing well with her nutrition in general, but is still  struggling with her variety as she feels she is set in what she is eating. Encouraged her to try a new fruit/vegetable or seasonal fruit/vegetable when she goes food shopping next. Another barrier is that when she is too busy to eat she will pick at whatever she can find for lunch - discussed some healthy snack options to have on hand - she would prefer to not have eggs - greek yogurt, hummus, edamame (dont eat with synthroid).    Expected Outcome ST: eat a rainbow of fruits/vegetables, choose plant foods from different groups (beans/lentils, berries, cruciferous vegetables, leafy greens, etc), include seasonal fruits/vegetables LT: maintain heart healthy habits, include a variety of foods             Psychosocial: Target Goals: Acknowledge presence or absence of significant depression and/or stress, maximize coping skills, provide positive support system. Participant is able to verbalize types and ability to use techniques and skills needed for reducing stress and depression.   Education: Stress, Anxiety, and Depression - Group verbal and visual presentation to define topics covered.  Reviews how body is impacted by stress, anxiety, and depression.  Also discusses healthy ways to reduce stress and to treat/manage anxiety and depression.  Written material given at graduation. Flowsheet Row Cardiac Rehab from 08/18/2022 in [facility] Cardiac and Pulmonary Rehab  Date 08/04/22  Educator [facility]  Instruction Review Code 1- [employer] Understanding       Education: Sleep Hygiene -Provides group verbal and written instruction about how sleep can affect your health.  Define sleep hygiene, discuss sleep cycles and impact of sleep habits. Review good sleep hygiene tips.    Initial Review & Psychosocial Screening:   Quality of Life Scores:   Scores of 19 and below usually indicate a poorer quality of life in these areas.  A difference of  2-3 points is a clinically meaningful difference.  A difference of  2-3 points in the total score of the Quality of Life Index has been associated with significant improvement in overall quality of life, self-image, physical symptoms, and general health in studies assessing change in quality of life.  PHQ-9: Review Flowsheet  More data exists      07/28/2022 06/30/2022 04/29/2020 01/10/2019 01/03/2019  Depression screen PHQ 2/9  Decreased Interest 0 0 0 0 0  Down, Depressed, Hopeless 1 0 0 0 0  PHQ - 2 Score 1 0 0 0 0  Altered sleeping 1 2 - - -  Tired, decreased energy 3 2 - - -  Change in appetite 0 0 - - -  Feeling bad or failure about yourself  0 0 - - -  Trouble concentrating 1 0 - - -  Moving slowly or fidgety/restless 0 2 - - -  Suicidal thoughts 0 0 - - -  PHQ-9 Score 6 6 - - -  Difficult doing work/chores Somewhat difficult Somewhat difficult - - -   Interpretation of Total Score  Total Score Depression Severity:  1-4 = Minimal depression, 5-9 = Mild depression, 10-14 = Moderate depression, 15-19 = Moderately severe depression, 20-27 = Severe depression   Psychosocial Evaluation and Intervention:   Psychosocial Re-Evaluation:  Psychosocial Re-Evaluation     Row Name 07/28/22 0945             Psychosocial Re-Evaluation   Current issues with Current Stress Concerns;Current Sleep Concerns       Comments Lemma reports that this past winter, two  of her brothers passed away and this summer her other brother was diagnosed with cancer. Her older sister is still alive, but is 44 and has health issues at this time; she is grateful that she is still alive. She was supposed [place] be in the mountains this [place] and is sad that she wasn't able to go. She relies on her husband and daughter for support; her daughter lives in [place], [place]. 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 She enjoys word searches to reduce stress and will go to church to try and look at the positive in life.        Expected Outcomes ST: continue to attend rehab for mental health boost LT: maintain positive attitude       Interventions Encouraged to attend Cardiac Rehabilitation for the exercise                Psychosocial Discharge (Final Psychosocial Re-Evaluation):  Psychosocial Re-Evaluation - 07/28/22 0945       Psychosocial Re-Evaluation   Current issues with Current Stress Concerns;Current Sleep Concerns    Comments Jaena reports that this past winter, two of her brothers passed away and this summer her other brother was diagnosed with cancer. Her older sister is still alive, but is 85 and has health issues at this time; she is grateful that she is still alive. She was supposed to be in the mountains this summer and is sad that she wasn't able to go. She relies on her husband and daughter for support; her daughter lives in [place], [place]. 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 She enjoys word searches to reduce stress and will go to church to try and look at the positive in life.    Expected Outcomes ST: continue to attend rehab for mental health boost LT: maintain positive attitude    Interventions Encouraged to attend Cardiac Rehabilitation for the exercise             Vocational Rehabilitation: Provide vocational rehab assistance to qualifying candidates.   Vocational Rehab Evaluation & Intervention:   Education: Education Goals: Education classes will be provided on a variety of topics geared toward better understanding of heart health and risk factor modification. Participant will state understanding/return demonstration of topics presented as noted by education test scores.  Learning Barriers/Preferences:   General Cardiac Education Topics:  AED/CPR: - Group verbal and written instruction with the use of models to demonstrate the basic use of the AED with the basic ABC's of resuscitation.   Anatomy and  Cardiac Procedures: - Group verbal and visual presentation and models provide information about basic cardiac anatomy and function. Reviews the testing methods done to diagnose heart disease and the outcomes of the test results. Describes the treatment choices: Medical Management, Angioplasty, or Coronary Bypass Surgery for treating various heart conditions including Myocardial Infarction, Angina, Valve Disease, and Cardiac Arrhythmias.  Written material given at graduation. Flowsheet Row Cardiac Rehab from 08/18/2022 in [facility] Cardiac and Pulmonary Rehab  Education need identified 06/30/22       Medication Safety: - Group verbal and visual instruction to review commonly prescribed medications for heart and lung disease. Reviews the medication, class of the drug, and side effects. Includes the steps to properly store meds and maintain the prescription regimen.  Written material given at graduation. Flowsheet Row Cardiac Rehab from  08/18/2022 in [facility] Cardiac and Pulmonary Rehab  Date 07/14/22  Educator SB  Instruction Review Code 1- Verbalizes Understanding       Intimacy: - Group verbal instruction through game format to discuss how heart and lung disease can affect sexual intimacy. Written material given at graduation.. Flowsheet Row Cardiac Rehab from 08/18/2022 in [facility] Cardiac and Pulmonary Rehab  Date 08/18/22  Educator [facility]  Instruction Review Code 1- Verbalizes Understanding       Know Your Numbers and Heart Failure: - Group verbal and visual instruction to discuss disease risk factors for cardiac and pulmonary disease and treatment options.  Reviews associated critical values for Overweight/Obesity, Hypertension, Cholesterol, and Diabetes.  Discusses basics of heart failure: signs/symptoms and treatments.  Introduces Heart Failure Zone chart for action plan for heart failure.  Written material given at graduation. Flowsheet Row Cardiac Rehab from 08/18/2022 in [facility] Cardiac and Pulmonary  Rehab  Education need identified 06/30/22  Date 07/21/22  Educator SB  Instruction Review Code 1- Verbalizes Understanding       Infection Prevention: - Provides verbal and written material to individual with discussion of infection control including proper hand washing and proper equipment cleaning during exercise session. Flowsheet Row Cardiac Rehab from 08/18/2022 in [facility] Cardiac and Pulmonary Rehab  Education need identified 06/30/22  Date 06/30/22  Educator [place]  Instruction Review Code 1- Verbalizes Understanding       Falls Prevention: - Provides verbal and written material to individual with discussion of falls prevention and safety. Flowsheet Row Cardiac Rehab from 06/17/2022 in [facility] Cardiac and Pulmonary Rehab  Date 06/17/22  Educator SB  Instruction Review Code 1- Verbalizes Understanding       Other: -Provides group and verbal instruction on various topics (see comments)   Knowledge Questionnaire Score:   Core Components/Risk Factors/Patient Goals at Admission:   Education:Diabetes - Individual verbal and written instruction to review signs/symptoms of diabetes, desired ranges of glucose level fasting, after meals and with exercise. Acknowledge that pre and post exercise glucose checks will be done for 3 sessions at entry of program.   Core Components/Risk Factors/Patient Goals Review:   Goals and Risk Factor Review     Row Name 07/28/22 0924             Core Components/Risk Factors/Patient Goals Review   Personal Goals Review Weight Management/Obesity;Lipids;Hypertension       Review She has pain in her left leg from a stroke she had which caused her to not sleep well last night - this will happen sometimes. They only time she sleeps well when she is working all day and then is so tired from the day. She reports that her weight has been stbale since starting rehab. She checks her BP at home every monring - if it is below 491 systolic she is not supposed  to take her BP medications. 110s-120s/70s-80s; today in rehab her BP was 138/80. She is taking all of her medications as prescribed with no issues at this time. She reports after rehab in the afternoon she will get aching in her chest that is not painful, but will scare her - she reports it goes away after about a minute and is relieved with pressure; encouraged her to let her MD know.       Expected Outcomes ST: make MD aware of chest ache LT: manage risk factors                Core Components/Risk Factors/Patient Goals at Discharge (  Final Review):   Goals and Risk Factor Review - 07/28/22 0924       Core Components/Risk Factors/Patient Goals Review   Personal Goals Review Weight Management/Obesity;Lipids;Hypertension    Review She has pain in her left leg from a stroke she had which caused her to not sleep well last night - this will happen sometimes. They only time she sleeps well when she is working all day and then is so tired from the day. She reports that her weight has been stbale since starting rehab. She checks her BP at home every monring - if it is below 604 systolic she is not supposed to take her BP medications. 110s-120s/70s-80s; today in rehab her BP was 138/80. She is taking all of her medications as prescribed with no issues at this time. She reports after rehab in the afternoon she will get aching in her chest that is not painful, but will scare her - she reports it goes away after about a minute and is relieved with pressure; encouraged her to let her MD know.    Expected Outcomes ST: make MD aware of chest ache LT: manage risk factors             ITP Comments:  ITP Comments     Row Name 07/20/22 0937 07/21/22 1005 08/18/22 1409 08/25/22 1143 09/06/22 1359   ITP Comments Completed initial RD consultation 30 Day review completed. Medical Director ITP review done, changes made as directed, and signed approval by Medical Director. 30 Day review completed. Medical Director  ITP review done, changes made as directed, and signed approval by Medical Director. Timara stated she has chest pressure while exercising. It resolved with rest. She stated that the same symptom occurred the last day she was here, after she got home. 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  She was advised to call her cardiology physician and let them know of the symptoms occurring. She has an appt at end of the month, advised her to see if she could move appt up to this week. Also advised her if the symptoms occur again and do not resolve to call 911. She verbalized understanding. [place] has been out since her chest pain episode.  She has had a stress test and was advised to wait to return until stress test reviewed.  Her next follow up is scheduled for Friday 9/29.    [place] Name 09/09/22 1058 09/13/22 0736 09/15/22 1006       ITP Comments Patient will be out of rehab until stress test results are back and clearance is provided. Patient called and would like to discharge from program. She is still having a difficult time with her breathing. Her physician encouraged her to step away from rehab for now.  We will discharge her at this time. Patient called and would like to discharge from program. She is still having a difficult time with her breathing. Her physician encouraged her to step away from rehab for now.  We will discharge her at this time.              Comments:

## 2022-09-23 ENCOUNTER — Encounter (HOSPITAL_BASED_OUTPATIENT_CLINIC_OR_DEPARTMENT_OTHER): Payer: Self-pay

## 2022-10-27 ENCOUNTER — Ambulatory Visit (INDEPENDENT_AMBULATORY_CARE_PROVIDER_SITE_OTHER): Payer: Medicare Other | Admitting: Cardiology

## 2022-10-27 ENCOUNTER — Encounter (HOSPITAL_BASED_OUTPATIENT_CLINIC_OR_DEPARTMENT_OTHER): Payer: Self-pay | Admitting: Cardiology

## 2022-10-27 VITALS — BP 142/88 | HR 77 | Ht 65.0 in | Wt 118.6 lb

## 2022-10-27 DIAGNOSIS — Z8673 Personal history of transient ischemic attack (TIA), and cerebral infarction without residual deficits: Secondary | ICD-10-CM | POA: Diagnosis not present

## 2022-10-27 DIAGNOSIS — I10 Essential (primary) hypertension: Secondary | ICD-10-CM | POA: Diagnosis not present

## 2022-10-27 DIAGNOSIS — E785 Hyperlipidemia, unspecified: Secondary | ICD-10-CM

## 2022-10-27 DIAGNOSIS — I471 Supraventricular tachycardia, unspecified: Secondary | ICD-10-CM

## 2022-10-27 DIAGNOSIS — I2510 Atherosclerotic heart disease of native coronary artery without angina pectoris: Secondary | ICD-10-CM | POA: Diagnosis not present

## 2022-10-27 NOTE — Progress Notes (Signed)
Cardiology Office Note   Date:  06/16/2021   ID:  Shannon [name], DOB [birth year], MRN [id number]  PCP:  Donalynn Furlong, MD  Cardiologist:  Buford Dresser, MD  Referring MD: Donalynn Furlong, MD   CC: follow up  History of Present Illness:    Shannon [name] is a 76 y.o. female with a hx of CAD s/p PCI, hypertension, CVA who is seen for follow up today. Follows with neurology, cardiology, and interventional neurology, s/p multiple angioplasties of severe R MCA stenosis.  Cardiac history: cardiac catheterization 05/2022, underwent PCI/DES to prox RCA, nonobstructive disease (dist LAD, mid-dist Cx 10%). Discharged on Brilinta '90mg'$  BID for at least 6 months.   She most recently followed up with Shannon Montana, NP on 09/10/2022. She reported at home blood pressures averaging less than 130/80 with some readings less than 568 systolic. She complained of ongoing DOE which she attributed to deconditioning. Planned to discuss possible referral to outpatient PT at her follow-up. No medication changes made.  Today, she is concerned about 2 recent symptomatic events. On 10/20/22, her breathing became difficult as if it was "cut off", and she felt heaviness in her central chest. She also felt woozy but denied actual dizziness. She checked her pulse via her smart watch which was 57 bpm. However she notes it felt like her heart rate should have been higher. During her second event she had just climbed out of bed when she began feeling like her head was pressurized. Her heart was racing, but her pulse was 69 bpm.   She presents a BP log today which is personally reviewed and shows 127N systolic on average. She does have some low blood pressures during which she "can't function." In clinic today her blood pressure is elevated at 197/76 (improved to 142/88 on recheck), which she attributes to rushing to the appointment today. Additionally she has noticed heart rates that seem to randomly spike and normalize  while checking her blood pressures. Of note, she does complain of right sided headaches with neck stiffness over the past 6 months. She reports that her Brilinta was recently decreased to 60 mg.  Generally she tries to walk on flat terrain. She may not always achieve the maximum heart rate as advised by her physical therapist.   Since her stent placement in 05/2022 she has noticed gradual improvement, such as when walking up the slight incline at home.  She denies any peripheral edema, syncope, orthopnea, or PND.    Past Medical History:  Diagnosis Date   Anxiety    Artery stenosis ([place])    cerebral   Arthritis    Cancer ([place])    skin - squamous cell   Complication of anesthesia    " I had difficulty coming out of it"   Dyspnea    with exertion   Family history of adverse reaction to anesthesia    " My daughter has a problem coming ou to it"   GERD (gastroesophageal reflux disease)    Headache    migraines in the past   Hypertension    Hypothyroidism    Stroke ([place]) 11/2018   some weakness on right side    SVT (supraventricular tachycardia)    Thyroid disease     Past Surgical History:  Procedure Laterality Date   ABDOMINAL HYSTERECTOMY     APPENDECTOMY     CARDIAC CATHETERIZATION     CARPAL TUNNEL RELEASE Bilateral    COLONOSCOPY     CORONARY STENT INTERVENTION  N/A 05/31/2022   Procedure: CORONARY STENT INTERVENTION;  Surgeon: Jettie Booze, MD;  Location: [facility];  Service: Cardiovascular;  Laterality: N/A;   DILATION AND CURETTAGE OF UTERUS     INTRAVASCULAR ULTRASOUND/IVUS N/A 05/31/2022   Procedure: Intravascular Ultrasound/IVUS;  Surgeon: Jettie Booze, MD;  Location: [facility];  Service: Cardiovascular;  Laterality: N/A;   IR ANGIO INTRA EXTRACRAN SEL COM CAROTID INNOMINATE BILAT MOD SED  12/25/2018   IR ANGIO INTRA EXTRACRAN SEL COM CAROTID INNOMINATE BILAT MOD SED  10/30/2019   IR ANGIO INTRA EXTRACRAN SEL COM CAROTID INNOMINATE  BILAT MOD SED  09/23/2020   IR ANGIO VERTEBRAL SEL SUBCLAVIAN INNOMINATE UNI L MOD SED  12/25/2018   IR ANGIO VERTEBRAL SEL SUBCLAVIAN INNOMINATE UNI L MOD SED  10/30/2019   IR ANGIO VERTEBRAL SEL SUBCLAVIAN INNOMINATE UNI L MOD SED  09/23/2020   IR ANGIO VERTEBRAL SEL SUBCLAVIAN INNOMINATE UNI R MOD SED  01/04/2019   IR ANGIO VERTEBRAL SEL VERTEBRAL UNI R MOD SED  12/25/2018   IR ANGIO VERTEBRAL SEL VERTEBRAL UNI R MOD SED  10/30/2019   IR ANGIO VERTEBRAL SEL VERTEBRAL UNI R MOD SED  09/23/2020   IR CT HEAD LTD  01/04/2019   IR CT HEAD LTD  11/21/2019   IR INTRA CRAN STENT  01/04/2019   IR INTRA CRAN STENT  11/21/2019   IR RADIOLOGIST EVAL & MGMT  03/12/2021   IR RADIOLOGIST EVAL & MGMT  09/10/2021   IR TRANSCATH EXCRAN VERT OR CAR A STENT  11/17/2020   IR US GUIDE VASC ACCESS RIGHT  12/25/2018   IR US GUIDE VASC ACCESS RIGHT  11/02/2019   LEFT HEART CATH AND CORONARY ANGIOGRAPHY N/A 05/31/2022   Procedure: LEFT HEART CATH AND CORONARY ANGIOGRAPHY;  Surgeon: Jettie Booze, MD;  Location: [place] [facility];  Service: Cardiovascular;  Laterality: N/A;   OVARIAN CYST REMOVAL     RADIOLOGY WITH ANESTHESIA N/A 01/04/2019   Procedure: RMCA angioplasty with possible stenting;  Surgeon: Luanne Bras, MD;  Location: [place];  Service: Radiology;  Laterality: N/A;   RADIOLOGY WITH ANESTHESIA N/A 11/21/2019   Procedure: IR WITH ANESTHESIA/ STENT PLACEMENT;  Surgeon: Luanne Bras, MD;  Location: [place];  Service: Radiology;  Laterality: N/A;   RADIOLOGY WITH ANESTHESIA N/A 10/01/2020   Procedure: IR WITH ANESTHESIA ANGIOPLASTY WITH POSSIBLE STENTING;  Surgeon: Luanne Bras, MD;  Location: [place];  Service: Radiology;  Laterality: N/A;   RADIOLOGY WITH ANESTHESIA N/A 11/17/2020   Procedure: IR WITH ANESTHESIA STENT PLACEMENT;  Surgeon: Luanne Bras, MD;  Location: [place];  Service: Radiology;  Laterality: N/A;    Current Medications: Current Outpatient Medications on File Prior to  Visit  Medication Sig   acetaminophen (TYLENOL) 325 MG tablet Take 325 mg by mouth every 6 (six) hours as needed for headache.    aspirin EC 81 MG EC tablet Take 1 tablet (81 mg total) by mouth daily.   atorvastatin (LIPITOR) 40 MG tablet Take 1 tablet (40 mg total) by mouth daily at 6 PM.   Calcium Carb-Cholecalciferol (CALCIUM 600 + D PO) Take 300-600 mg by mouth See admin instructions. Take 600 mg after breakfast, 600 mg after lunch, and 300 mg after dinner   Coenzyme Q10 50 MG CAPS Take 50 mg by mouth daily.    levothyroxine (SYNTHROID) 75 MCG tablet Take 75 mcg by mouth every Tuesday, Thursday, Saturday, and Sunday.    levothyroxine (SYNTHROID, LEVOTHROID) 50 MCG tablet Take  50 mcg by mouth every Monday, Wednesday, and Friday.    loratadine (CLARITIN) 10 MG tablet Take 10 mg by mouth daily.   metoprolol succinate (TOPROL-XL) 25 MG 24 hr tablet Take 0.5 tablets (12.5 mg total) by mouth daily.   Multiple Vitamin (MULTIVITAMIN) capsule Take 1 capsule by mouth daily. Senior   nitroGLYCERIN (NITROSTAT) 0.4 MG SL tablet Place 1 tablet (0.4 mg total) under the tongue every 5 (five) minutes as needed for chest pain.   Polyvinyl Alcohol-Povidone PF (REFRESH) 1.4-0.6 % SOLN Apply 1 drop to eye as needed (dry eye). Both eyes   Propylene Glycol (SYSTANE BALANCE OP) Apply 1 drop to eye as needed (dry eye). Both eyes.   ticagrelor (BRILINTA) 90 MG TABS tablet Take 1 tablet (90 mg total) by mouth 2 (two) times daily.   vitamin E 400 UNIT capsule Take 400 Units by mouth every other day.    No current facility-administered medications on file prior to visit.     Allergies:   Elastic bandages & [zinc], Sulfa antibiotics, Tape, Mandol [cefamandole], and Sinutab non-drowsy max [pseudoephedrine-acetaminophen]   Social History   Tobacco Use   Smoking status: Never   Smokeless tobacco: Never  Vaping Use   Vaping Use: Never used  Substance Use Topics   Alcohol use: Never   Drug use: Never    Family  History: family history includes Breast cancer in her sister and sister; CVA in her maternal aunt and mother; Heart disease in her mother; Peripheral Artery Disease in her father.  ROS:   Please see the history of present illness.   (+) Shortness of breath (+) Central chest heaviness (+) Lightheaded/woozy (+) Rapid palpitations (+) Cephalic pressure (+) Headaches Additional pertinent ROS otherwise unremarkable.  EKGs/Labs/Other Studies Reviewed:    The following studies were reviewed today:  Lexiscan Stress Test  09/02/2022:   The study is normal. The study is low risk based on normal perfusion.   Severely elevated pre-test BP of 201/92 mmHg.   LV perfusion is normal. There is no evidence of ischemia. There is no evidence of infarction.   Left ventricular function is normal. Nuclear stress EF: 68 %. The left ventricular ejection fraction is hyperdynamic (>65%). End diastolic cavity size is normal. End systolic cavity size is normal.   Prior study not available for comparison.  CTA Head/Neck 06/02/2022: IMPRESSION: No acute intracranial abnormality.  No large vessel occlusion.   Patent left vertebral origin stent with similar mild in-stent narrowing.   Patent right supraclinoid ICA to M2 MCA stents with similar appearance of areas of irregularity and stenosis.  Left Heart Cath 05/31/2022:   Prox RCA lesion is 75% stenosed.  A drug-eluting stent was successfully placed using a SYNERGY XD 3.0X16, postdilated to 3.25 mm and optimized with IVUS.   Post intervention, there is a 0% residual stenosis.   Dist LAD lesion is 10% stenosed.   Mid Cx to Dist Cx lesion is 10% stenosed.   The left ventricular systolic function is normal.   LV end diastolic pressure is normal.   The left ventricular ejection fraction is 55-65% by visual estimate.   There is no aortic valve stenosis.   Successful PCI of the proximal RCA.  Patient was on a very low-dose of Brilinta in the past.  Apparently, she  was a Plavix nonresponder.  She will need to go back to Brilinta 90 mg twice a day for 6 months, ideally.  Could consider 3 months if there were bleeding issues.  After that time, her Brilinta could be reduced.  Will CC Dr. Estanislado Pandy.   Continue aggressive secondary prevention.  Diagnostic Dominance: Right  Intervention    Coronary CT/Calcium Score 12/03/2021: Coronary Arteries:  Normal coronary origin.  Right dominance.   RCA is a large dominant artery that gives rise to PDA and PLA. There is noncalcified plaque in the proximal RCA causing 50-69% stenosis. Calcified plaque in the proximal RCA causes 0-24% stenosis   Left main is a large artery that gives rise to LAD and LCX arteries.   LAD is a large vessel. There is calcified plaque in the proximal LAD causing 0-24% stenosis   LCX is a non-dominant artery that gives rise to one large OM1 branch. There is calcified plaque in the proximal LCX causing 0-24% stenosis   Other findings:   Left Ventricle: Normal size   Left Atrium: Mild enlargement. PFO   Pulmonary Veins: Normal configuration   Right Ventricle: Normal size   Right Atrium: Normal size   Cardiac valves: No calcifications   Thoracic aorta: Normal size   Pulmonary Arteries: Normal size   Systemic Veins: Normal drainage   Pericardium: Normal thickness   IMPRESSION: 1. Coronary calcium score of 150. This was 67th percentile for age and sex matched control.   2.  Normal coronary origin with right dominance.   3. Noncalcified plaque in the proximal RCA causes moderate (50-69%) stenosis   4. Calcified plaque in the proximal RCA, proximal LAD, and proximal LCX causes minimal (0-24%) stenosis   5.  PFO   6.  Will send for CT FFR   CAD-RADS 3. Moderate stenosis. Consider symptom-guided anti-ischemic pharmacotherapy as well as risk factor modification per guideline directed care. Additional analysis with CT FFR will be submitted.  ETT  02/26/21: Blood pressure demonstrated a normal response to exercise. Upsloping ST segment depression ST segment depression of 1 mm was noted during stress in the V5, V6, III and aVF leads.   ETT with fair exercise tolerance (6:19); no chest pain; normal blood pressure response; study terminated secondary to dyspnea; 1 mm of upsloping to horizontal ST depression in the inferior lateral leads of borderline significance; patient achieved 74% (inadequate) predicted maximum heart rate; suggest alternative study if clinical suspicion of ischemia high (nuclear study or cardiac CTA).   Monitor 07/31/20 7 days of data recorded on Zio monitor. Patient had a min HR of 50 bpm, max HR of 158 bpm, and avg HR of 85 bpm. Predominant underlying rhythm was Sinus Rhythm. No VT, atrial fibrillation, high degree block, or pauses noted. Isolated atrial and ventricular ectopy was rare (<1%). 524 Supraventricular Tachycardia runs occurred, the run with the fastest interval lasting 6 beats with a max rate of 158 bpm, the longest lasting 19 beats with an avg rate of 104 bpm.   Echo 12/09/2018 - Left ventricle: The cavity size was normal. Systolic function was   normal. The estimated ejection fraction was in the range of 60%   to 65%. Wall motion was normal; there were no regional wall   motion abnormalities. Left ventricular diastolic function   parameters were normal. - Left atrium: The atrium was normal in size. - Right ventricle: Systolic function was normal. - Atrial septum: No defect or patent foramen ovale was identified.   Echo contrast study showed no right-to-left atrial level shunt,   at baseline or with provocation. - Pulmonary arteries: Systolic pressure was within the normal   range.  ETT 09/04/2018 [facility]) Baseline HR bpm  70   Baseline BP mmHg 186/83   Peak HR bpm 121   PMHR % % 82   Peak BP mmHg 213/80   Exercise duration (min) min 9   Exercise duration (sec) sec 0   Stage Reached  3   Estimated  workload METS 10.1   ST Deviation mm 0.0   Duke Treadmill Score  5.0   Angina Score for Duke Treadmill Score  1    Narrative    Minor non-specific ST and T wave changes noted during stress or recovery   Overall, the patient's exercise capacity was excellent.   Negative ETT for ischemia at workload achieved   The patient experienced atypical chest pain during the stress test.   No significant arrhythmia noted   Reassurance.  BP control important.   Cardiac cath 04/2012 [facility]) Per note 07/17/2015  Carotid doppler 08/16/2014  Normal Duplex Doppler of Carotids   Cardiovascular stress test 04/2012  NL LVEF; inferior ischemia   Cardiac catheterization 04/2012  Nonobstructive 30% RCA disease otherwise NL and EF 65%  EKG:  Personally reviewed today 10/27/2022:  EKG was not ordered. 08/27/2022 Sande Rives, PA-C): normal sinus rhythm, rate 84 bpm, with slight ST depression (<0.49m) in I, II, V4, V5.  01/11/2022: EKG was not ordered. 4/22- EKG was not ordered 04/09/20- NSR with sinus arrhythmia  Recent Labs: 10/28/2021: Magnesium 2.4; TSH 0.773 06/02/2022: BUN 12; Creatinine, Ser 0.71; Hemoglobin 14.4; Platelets 184; Potassium 4.1; Sodium 138   Recent Lipid Panel    Component Value Date/Time   CHOL 199 12/09/2018 0352   TRIG 110 12/09/2018 0352   HDL 45 12/09/2018 0352   CHOLHDL 4.4 12/09/2018 0352   VLDL 22 12/09/2018 0352   LDLCALC 132 (H) 12/09/2018 0352    Physical Exam:    VS:  BP (!) 142/88 (BP Location: Right Arm, Patient Position: Sitting, Cuff Size: Normal)   Pulse 77   Ht '5\' 5"'$  (1.651 m)   Wt 118 lb 9.6 oz (53.8 kg)   BMI 19.74 kg/m     Wt Readings from Last 3 Encounters:  10/27/22 118 lb 9.6 oz (53.8 kg)  09/10/22 118 lb 1.6 oz (53.6 kg)  09/02/22 118 lb (53.5 kg)   GEN: Well nourished, well developed in no acute distress HEENT: Normal, moist mucous membranes NECK: No JVD CARDIAC: regular rhythm, normal S1 and S2, no rubs or gallops. No  murmur. VASCULAR: Radial and DP pulses 2+ bilaterally. No carotid bruits RESPIRATORY:  Clear to auscultation without rales, wheezing or rhonchi  ABDOMEN: Soft, non-tender, non-distended MUSCULOSKELETAL:  Ambulates independently SKIN: Warm and dry, no edema NEUROLOGIC:  Alert and oriented x 3. No focal neuro deficits noted. PSYCHIATRIC:  Normal affect    ASSESSMENT:    1. Coronary artery disease involving native coronary artery of native heart without angina pectoris   2. Essential hypertension   3. History of stroke   4. Hyperlipidemia LDL goal <70   5. Paroxysmal SVT (supraventricular tachycardia)    PLAN:    CAD s/p PCI 05/2022 -pRCA lesion 75% stenosed, s/p PCI with DES. Only other disease was trivial in the distal LAD and distal Lcx (10%) -continue atorvastatin -history of being a clopidogrel nonresponder.  -continue aspirin and ticagrelor 90 mg BID for one year.  -long term ticagrelor dose needs to be determined by Dr. DEstanislado Pandy Was previously on ticagrelor 60 mg BID before PCI. After one year, reasonable from a cardiac standpoint to return to this dosing, but need to make  sure this is reasonable from interventional radiology standpoint. -she has been disappointed because she expected to feel remarkably better after receiving a stent. We discussed this at length today. I suspect there are multifactorial components to many of her symptoms. -given concern for etiology of her symptoms she underwent lexiscan myoview, which was very reassuring.   Hypertension: labile readings -typically higher in office, use home numbers for medication adjustments -was running symptomatically low at home, better with cessation of lisinopril and amlodipine -did not tolerate diltiazem for pSVT, changed to metoprolol  History of CVA, with need for secondary prevention: -on aspirin, ticagrelor. Ticagrelor duration per interventional neurology -on high intensity atorvastatin -echo 12/09/18 was normal,  negative bubble study  PFO -incidental on CT. Negative bubble study on prior echo. -history of stroke, but due to ischemia/stenosis  Palpitations, tachycardia: -monitor shows paroxysmal SVT   Cardiac risk counseling and prevention recommendations: -recommend heart healthy/Mediterranean diet, with whole grains, fruits, vegetable, fish, lean meats, nuts, and olive oil. Limit salt. -recommend moderate walking, 3-5 times/week for 30-50 minutes each session. Aim for at least 150 minutes.week. Goal should be pace of 3 miles/hours, or walking 1.5 miles in 30 minutes -recommend avoidance of tobacco products. Avoid excess alcohol.   Plan for follow up: 6 months or sooner as needed  Medication Adjustments/Labs and Tests Ordered: Current medicines are reviewed at length with the patient today.  Concerns regarding medicines are outlined above.   No orders of the defined types were placed in this encounter.  No orders of the defined types were placed in this encounter.  Patient Instructions  Medication Instructions:  Continue current medications  *If you need a refill on your cardiac medications before your next appointment, please call your pharmacy*   Lab Work: None Ordered   Testing/Procedures: None Ordered   Follow-Up: At [facility], you and your health needs are our priority.  As part of our continuing mission to provide you with exceptional heart care, we have created designated Provider Care Teams.  These Care Teams include your primary Cardiologist (physician) and Advanced Practice Providers (APPs -  Physician Assistants and Nurse Practitioners) who all work together to provide you with the care you need, when you need it.  We recommend signing up for the patient portal called "MyChart".  Sign up information is provided on this After Visit Summary.  MyChart is used to connect with patients for Virtual Visits (Telemedicine).  Patients are able to view lab/test results,  encounter notes, upcoming appointments, etc.  Non-urgent messages can be sent to your provider as well.   To learn more about what you can do with MyChart, go to [email or web address].    Your next appointment:   6 month(s)  The format for your next appointment:   In Person  Provider:   Buford Dresser, MD    Other Instructions            I,Mathew Stumpf,acting as a scribe for Buford Dresser, MD.,have documented all relevant documentation on the behalf of Buford Dresser, MD,as directed by  Buford Dresser, MD while in the presence of Buford Dresser, MD.  I, Buford Dresser, MD, have reviewed all documentation for this visit. The documentation on 10/27/22 for the exam, diagnosis, procedures, and orders are all accurate and complete.   Signed, Buford Dresser, MD PhD 10/27/2022    [place]

## 2022-10-27 NOTE — Patient Instructions (Signed)
Medication Instructions:  Continue current medications  *If you need a refill on your cardiac medications before your next appointment, please call your pharmacy*   Lab Work: None Ordered   Testing/Procedures: None Ordered   Follow-Up: At [facility], you and your health needs are our priority.  As part of our continuing mission to provide you with exceptional heart care, we have created designated Provider Care Teams.  These Care Teams include your primary Cardiologist (physician) and Advanced Practice Providers (APPs -  Physician Assistants and Nurse Practitioners) who all work together to provide you with the care you need, when you need it.  We recommend signing up for the patient portal called "MyChart".  Sign up information is provided on this After Visit Summary.  MyChart is used to connect with patients for Virtual Visits (Telemedicine).  Patients are able to view lab/test results, encounter notes, upcoming appointments, etc.  Non-urgent messages can be sent to your provider as well.   To learn more about what you can do with MyChart, go to [email or web address].    Your next appointment:   6 month(s)  The format for your next appointment:   In Person  Provider:   Buford Dresser, MD    Other Instructions

## 2022-10-28 ENCOUNTER — Ambulatory Visit (HOSPITAL_BASED_OUTPATIENT_CLINIC_OR_DEPARTMENT_OTHER): Payer: Medicare Other | Admitting: Family

## 2022-12-17 ENCOUNTER — Other Ambulatory Visit: Payer: Self-pay | Admitting: Cardiovascular Disease

## 2022-12-17 NOTE — Telephone Encounter (Signed)
Rx request sent to pharmacy.  

## 2022-12-21 ENCOUNTER — Other Ambulatory Visit (HOSPITAL_COMMUNITY): Payer: Self-pay | Admitting: Interventional Radiology

## 2022-12-21 DIAGNOSIS — I771 Stricture of artery: Secondary | ICD-10-CM

## 2022-12-28 ENCOUNTER — Ambulatory Visit (HOSPITAL_COMMUNITY)
Admission: RE | Admit: 2022-12-28 | Discharge: 2022-12-28 | Disposition: A | Discharge status: Home / Self Care | Payer: Medicare Other | Source: Ambulatory Visit | Attending: Interventional Radiology | Admitting: Interventional Radiology

## 2022-12-28 DIAGNOSIS — I771 Stricture of artery: Secondary | ICD-10-CM | POA: Diagnosis not present

## 2022-12-28 MED ORDER — IOHEXOL 350 MG/ML SOLN
100.0000 mL | Freq: Once | INTRAVENOUS | Status: AC | PRN
  Administered 2022-12-28: 100 mL via INTRAVENOUS

## 2022-12-29 ENCOUNTER — Other Ambulatory Visit: Payer: Self-pay | Admitting: Family Medicine

## 2022-12-29 DIAGNOSIS — Z1231 Encounter for screening mammogram for malignant neoplasm of breast: Secondary | ICD-10-CM

## 2022-12-30 ENCOUNTER — Other Ambulatory Visit (HOSPITAL_COMMUNITY): Payer: Self-pay | Admitting: Interventional Radiology

## 2022-12-30 DIAGNOSIS — I771 Stricture of artery: Secondary | ICD-10-CM

## 2023-01-06 ENCOUNTER — Ambulatory Visit (HOSPITAL_COMMUNITY)
Admission: RE | Admit: 2023-01-06 | Discharge: 2023-01-06 | Disposition: A | Discharge status: Home / Self Care | Payer: Medicare Other | Source: Ambulatory Visit | Attending: Interventional Radiology | Admitting: Interventional Radiology

## 2023-01-06 DIAGNOSIS — I771 Stricture of artery: Secondary | ICD-10-CM

## 2023-01-07 HISTORY — PX: IR RADIOLOGIST EVAL & MGMT: IMG5224

## 2023-02-17 ENCOUNTER — Ambulatory Visit
Admission: RE | Admit: 2023-02-17 | Discharge: 2023-02-17 | Disposition: A | Discharge status: Home / Self Care | Payer: Medicare Other | Source: Ambulatory Visit | Attending: Family Medicine | Admitting: Family Medicine

## 2023-02-17 DIAGNOSIS — Z1231 Encounter for screening mammogram for malignant neoplasm of breast: Secondary | ICD-10-CM

## 2023-05-02 NOTE — Progress Notes (Signed)
Cardiology Office Note   Date:  05/03/2023  ID:  Shannon [name], DOB November 30, 1946, MRN [id number]  PCP:  Shannon Frisk, MD  Cardiologist:  Shannon Red, MD  Referring MD: Shannon Frisk, MD   CC: follow up  History of Present Illness:    Shannon [name] is a 77 y.o. female with a hx of CAD s/p PCI, hypertension, CVA who is seen for follow up today. Follows with neurology, cardiology, and interventional neurology, s/p multiple angioplasties of severe R MCA stenosis.  Cardiac history: cardiac catheterization 05/2022, underwent PCI/DES to prox RCA, nonobstructive disease (dist LAD, mid-dist Cx 10%). Discharged on Brilinta 90mg  BID for at least 6 months.   Today, she reports having good days and bad days. On the good days she feels like she is 77 yo; on the bad days she feels 77 yo. On some days she wakes up in the mornings with barely any energy, making it difficult to move.  In clinic today, her blood pressure is initially elevated to 177/76 (144 systolic at home this morning). On manual recheck her BP is 168/88 in the office. At home she has noticed both very low readings (such as 105/71), and higher readings with diastolic pressures over 100. She has not been able to determine any clear correlations.  When she does experience chest pain, it is usually localized to her central chest, or closer to the LUQ/inferior to left breast. Also she complains of shortness of breath that she feels "down the middle" of her chest. This occurs both with walking, and randomly while she is sitting.  She has been keeping a log of her symptoms, blood pressures, and heart rates. One day she recorded labored breathing and heaviness in her chest with a heart rate in the 60's. During a travel day, her blood pressure was initially 128/70 with a pulse of 81 bpm. About 2 hours later at a rest area, she had a dizzy spell while walking to the restroom. On another day she noted sudden weakness and fatigue, with  feeling her heart beating faster and a blood pressure of 127/80. In March, while she was sitting down at the table she had a spontaneous episode of epistaxis.  Additionally she is experiencing more headaches lately, most recently this past Saturday and Sunday. On Sunday, she also experienced pain down her left side inferior to her breast at one point, and a dizzy spell while changing clothes.  Lately she is working towards an exercise goal of 5,000 steps a day. This is usually attainable if she goes on her walk and goes shopping. If she does her walk and works around the house, she will only be close to the 5,000 steps.  She denies any peripheral edema, syncope, orthopnea, or PND.   Past Medical History:  Diagnosis Date   Anxiety    Artery stenosis (HCC)    cerebral   Arthritis    Cancer (HCC)    skin - squamous cell   Complication of anesthesia    " I had difficulty coming out of it"   Dyspnea    with exertion   Family history of adverse reaction to anesthesia    " My daughter has a problem coming ou to it"   GERD (gastroesophageal reflux disease)    Headache    migraines in the past   Hypertension    Hypothyroidism    Stroke (HCC) 11/2018   some weakness on right side    SVT (supraventricular tachycardia)  Thyroid disease     Past Surgical History:  Procedure Laterality Date   ABDOMINAL HYSTERECTOMY     APPENDECTOMY     CARDIAC CATHETERIZATION     CARPAL TUNNEL RELEASE Bilateral    COLONOSCOPY     CORONARY STENT INTERVENTION N/A 05/31/2022   Procedure: CORONARY STENT INTERVENTION;  Surgeon: Shannon Crafts, MD;  Location: [facility] INVASIVE CV [facility];  Service: Cardiovascular;  Laterality: N/A;   CORONARY ULTRASOUND/IVUS N/A 05/31/2022   Procedure: Intravascular Ultrasound/IVUS;  Surgeon: Shannon Crafts, MD;  Location: [facility] INVASIVE CV LAB;  Service: Cardiovascular;  Laterality: N/A;   DILATION AND CURETTAGE OF UTERUS     IR ANGIO INTRA EXTRACRAN SEL COM CAROTID  INNOMINATE BILAT MOD SED  12/25/2018   IR ANGIO INTRA EXTRACRAN SEL COM CAROTID INNOMINATE BILAT MOD SED  10/30/2019   IR ANGIO INTRA EXTRACRAN SEL COM CAROTID INNOMINATE BILAT MOD SED  09/23/2020   IR ANGIO VERTEBRAL SEL SUBCLAVIAN INNOMINATE UNI L MOD SED  12/25/2018   IR ANGIO VERTEBRAL SEL SUBCLAVIAN INNOMINATE UNI L MOD SED  10/30/2019   IR ANGIO VERTEBRAL SEL SUBCLAVIAN INNOMINATE UNI L MOD SED  09/23/2020   IR ANGIO VERTEBRAL SEL SUBCLAVIAN INNOMINATE UNI R MOD SED  01/04/2019   IR ANGIO VERTEBRAL SEL VERTEBRAL UNI R MOD SED  12/25/2018   IR ANGIO VERTEBRAL SEL VERTEBRAL UNI R MOD SED  10/30/2019   IR ANGIO VERTEBRAL SEL VERTEBRAL UNI R MOD SED  09/23/2020   IR CT HEAD LTD  01/04/2019   IR CT HEAD LTD  11/21/2019   IR INTRA CRAN STENT  01/04/2019   IR INTRA CRAN STENT  11/21/2019   IR RADIOLOGIST EVAL & MGMT  03/12/2021   IR RADIOLOGIST EVAL & MGMT  09/10/2021   IR RADIOLOGIST EVAL & MGMT  01/07/2023   IR TRANSCATH EXCRAN VERT OR CAR A STENT  11/17/2020   IR US GUIDE VASC ACCESS RIGHT  12/25/2018   IR US GUIDE VASC ACCESS RIGHT  11/02/2019   LEFT HEART CATH AND CORONARY ANGIOGRAPHY N/A 05/31/2022   Procedure: LEFT HEART CATH AND CORONARY ANGIOGRAPHY;  Surgeon: Shannon Crafts, MD;  Location: [facility] INVASIVE CV LAB;  Service: Cardiovascular;  Laterality: N/A;   OVARIAN CYST REMOVAL     RADIOLOGY WITH ANESTHESIA N/A 01/04/2019   Procedure: RMCA angioplasty with possible stenting;  Surgeon: Shannon Cotton, MD;  Location: [facility] OR;  Service: Radiology;  Laterality: N/A;   RADIOLOGY WITH ANESTHESIA N/A 11/21/2019   Procedure: IR WITH ANESTHESIA/ STENT PLACEMENT;  Surgeon: Shannon Cotton, MD;  Location: [facility] OR;  Service: Radiology;  Laterality: N/A;   RADIOLOGY WITH ANESTHESIA N/A 10/01/2020   Procedure: IR WITH ANESTHESIA ANGIOPLASTY WITH POSSIBLE STENTING;  Surgeon: Shannon Cotton, MD;  Location: [facility] OR;  Service: Radiology;  Laterality: N/A;   RADIOLOGY WITH ANESTHESIA N/A 11/17/2020    Procedure: IR WITH ANESTHESIA STENT PLACEMENT;  Surgeon: Shannon Cotton, MD;  Location: [facility] OR;  Service: Radiology;  Laterality: N/A;    Current Medications: Current Outpatient Medications on File Prior to Visit  Medication Sig   acetaminophen (TYLENOL) 325 MG tablet Take 325 mg by mouth every 6 (six) hours as needed for headache.    aspirin EC 81 MG EC tablet Take 1 tablet (81 mg total) by mouth daily.   atorvastatin (LIPITOR) 40 MG tablet Take 1 tablet (40 mg total) by mouth daily at 6 PM.   Calcium Carb-Cholecalciferol (CALCIUM 600 + D PO) Take 300-600 mg by mouth See admin instructions.  Take 600 mg after breakfast, 600 mg after lunch, and 300 mg after dinner   Coenzyme Q10 50 MG CAPS Take 50 mg by mouth daily.    levothyroxine (SYNTHROID) 75 MCG tablet Take 75 mcg by mouth every Tuesday, Thursday, Saturday, and Sunday.    levothyroxine (SYNTHROID, LEVOTHROID) 50 MCG tablet Take 50 mcg by mouth every Monday, Wednesday, and Friday.    loratadine (CLARITIN) 10 MG tablet Take 10 mg by mouth daily.   metoprolol succinate (TOPROL-XL) 25 MG 24 hr tablet TAKE 1/2 TABLET(12.5 MG) BY MOUTH DAILY   Multiple Vitamin (MULTIVITAMIN) capsule Take 1 capsule by mouth daily. Senior   nitroGLYCERIN (NITROSTAT) 0.4 MG SL tablet Place 1 tablet (0.4 mg total) under the tongue every 5 (five) minutes as needed for chest pain.   Polyvinyl Alcohol-Povidone PF (REFRESH) 1.4-0.6 % SOLN Apply 1 drop to eye as needed (dry eye). Both eyes   Propylene Glycol (SYSTANE BALANCE OP) Apply 1 drop to eye as needed (dry eye). Both eyes.   ticagrelor (BRILINTA) 90 MG TABS tablet Take 1 tablet (90 mg total) by mouth 2 (two) times daily.   vitamin E 400 UNIT capsule Take 400 Units by mouth every other day.    No current facility-administered medications on file prior to visit.     Allergies:   Elastic bandages & [zinc], Sulfa antibiotics, Tape, Mandol [cefamandole], and Sinutab non-drowsy max [pseudoephedrine-acetaminophen]    Social History   Tobacco Use   Smoking status: Never   Smokeless tobacco: Never  Vaping Use   Vaping Use: Never used  Substance Use Topics   Alcohol use: Never   Drug use: Never    Family History: family history includes Breast cancer in her sister and sister; CVA in her maternal aunt and mother; Heart disease in her mother; Peripheral Artery Disease in her father.  ROS:   Please see the history of present illness.   (+) Chest pain (+) Shortness of breath (+) Fatigue/Malaise (+) Headaches (+) Dizziness Additional pertinent ROS otherwise unremarkable.  EKGs/Labs/Other Studies Reviewed:    The following studies were reviewed today:  CTA Head/Neck  12/28/2022: IMPRESSION: 1. No acute intracranial process. No intracranial large vessel occlusion. 2. Patent right ICA/MCA and M2 stents, with unchanged areas of irregularity and stenosis. 3. Patent left vertebral artery origin stent with unchanged mild in-stent narrowing. 4. No hemodynamically significant stenosis in the neck. 5. Aortic atherosclerosis.  Lexiscan Stress Test  09/02/2022:   The study is normal. The study is low risk based on normal perfusion.   Severely elevated pre-test BP of 201/92 mmHg.   LV perfusion is normal. There is no evidence of ischemia. There is no evidence of infarction.   Left ventricular function is normal. Nuclear stress EF: 68 %. The left ventricular ejection fraction is hyperdynamic (>65%). End diastolic cavity size is normal. End systolic cavity size is normal.   Prior study not available for comparison.  CTA Head/Neck 06/02/2022: IMPRESSION: No acute intracranial abnormality.  No large vessel occlusion.   Patent left vertebral origin stent with similar mild in-stent narrowing.   Patent right supraclinoid ICA to M2 MCA stents with similar appearance of areas of irregularity and stenosis.  Left Heart Cath 05/31/2022:   Prox RCA lesion is 75% stenosed.  A drug-eluting stent was  successfully placed using a SYNERGY XD 3.0X16, postdilated to 3.25 mm and optimized with IVUS.   Post intervention, there is a 0% residual stenosis.   Dist LAD lesion is 10% stenosed.  Mid Cx to Dist Cx lesion is 10% stenosed.   The left ventricular systolic function is normal.   LV end diastolic pressure is normal.   The left ventricular ejection fraction is 55-65% by visual estimate.   There is no aortic valve stenosis.   Successful PCI of the proximal RCA.  Patient was on a very low-dose of Brilinta in the past.  Apparently, she was a Plavix nonresponder.  She will need to go back to Brilinta 90 mg twice a day for 6 months, ideally.  Could consider 3 months if there were bleeding issues.  After that time, her Brilinta could be reduced.  Will CC Dr. Corliss Skains.   Continue aggressive secondary prevention.  Diagnostic Dominance: Right  Intervention    Coronary CT/Calcium Score 12/03/2021: Coronary Arteries:  Normal coronary origin.  Right dominance.   RCA is a large dominant artery that gives rise to PDA and PLA. There is noncalcified plaque in the proximal RCA causing 50-69% stenosis. Calcified plaque in the proximal RCA causes 0-24% stenosis   Left main is a large artery that gives rise to LAD and LCX arteries.   LAD is a large vessel. There is calcified plaque in the proximal LAD causing 0-24% stenosis   LCX is a non-dominant artery that gives rise to one large OM1 branch. There is calcified plaque in the proximal LCX causing 0-24% stenosis   Other findings:   Left Ventricle: Normal size   Left Atrium: Mild enlargement. PFO   Pulmonary Veins: Normal configuration   Right Ventricle: Normal size   Right Atrium: Normal size   Cardiac valves: No calcifications   Thoracic aorta: Normal size   Pulmonary Arteries: Normal size   Systemic Veins: Normal drainage   Pericardium: Normal thickness   IMPRESSION: 1. Coronary calcium score of 150. This was 67th percentile  for age and sex matched control.   2.  Normal coronary origin with right dominance.   3. Noncalcified plaque in the proximal RCA causes moderate (50-69%) stenosis   4. Calcified plaque in the proximal RCA, proximal LAD, and proximal LCX causes minimal (0-24%) stenosis   5.  PFO   6.  Will send for CT FFR   CAD-RADS 3. Moderate stenosis. Consider symptom-guided anti-ischemic pharmacotherapy as well as risk factor modification per guideline directed care. Additional analysis with CT FFR will be submitted.  ETT 02/26/21: Blood pressure demonstrated a normal response to exercise. Upsloping ST segment depression ST segment depression of 1 mm was noted during stress in the V5, V6, III and aVF leads.   ETT with fair exercise tolerance (6:19); no chest pain; normal blood pressure response; study terminated secondary to dyspnea; 1 mm of upsloping to horizontal ST depression in the inferior lateral leads of borderline significance; patient achieved 74% (inadequate) predicted maximum heart rate; suggest alternative study if clinical suspicion of ischemia high (nuclear study or cardiac CTA).   Monitor 07/31/20 7 days of data recorded on Zio monitor. Patient had a min HR of 50 bpm, max HR of 158 bpm, and avg HR of 85 bpm. Predominant underlying rhythm was Sinus Rhythm. No VT, atrial fibrillation, high degree block, or pauses noted. Isolated atrial and ventricular ectopy was rare (<1%). 524 Supraventricular Tachycardia runs occurred, the run with the fastest interval lasting 6 beats with a max rate of 158 bpm, the longest lasting 19 beats with an avg rate of 104 bpm.   Echo 12/09/2018 - Left ventricle: The cavity size was normal. Systolic function was  normal. The estimated ejection fraction was in the range of 60%   to 65%. Wall motion was normal; there were no regional wall   motion abnormalities. Left ventricular diastolic function   parameters were normal. - Left atrium: The atrium was normal  in size. - Right ventricle: Systolic function was normal. - Atrial septum: No defect or patent foramen ovale was identified.   Echo contrast study showed no right-to-left atrial level shunt,   at baseline or with provocation. - Pulmonary arteries: Systolic pressure was within the normal   range.  ETT 09/04/2018 ([facility]) Baseline HR bpm 70   Baseline BP mmHg 186/83   Peak HR bpm 121   PMHR % % 82   Peak BP mmHg 213/80   Exercise duration (min) min 9   Exercise duration (sec) sec 0   Stage Reached  3   Estimated workload METS 10.1   ST Deviation mm 0.0   Duke Treadmill Score  5.0   Angina Score for Duke Treadmill Score  1    Narrative    Minor non-specific ST and T wave changes noted during stress or recovery   Overall, the patient's exercise capacity was excellent.   Negative ETT for ischemia at workload achieved   The patient experienced atypical chest pain during the stress test.   No significant arrhythmia noted   Reassurance.  BP control important.   Cardiac cath 04/2012 [facility]) Per note 07/17/2015  Carotid doppler 08/16/2014  Normal Duplex Doppler of Carotids   Cardiovascular stress test 04/2012  NL LVEF; inferior ischemia   Cardiac catheterization 04/2012  Nonobstructive 30% RCA disease otherwise NL and EF 65%  EKG:  Personally reviewed today 05/03/2023:  EKG was not ordered. 10/27/2022:  EKG was not ordered. 08/27/2022 Marjie Skiff, PA-C): normal sinus rhythm, rate 84 bpm, with slight ST depression (<0.34mm) in I, II, V4, V5.  01/11/2022: EKG was not ordered. 4/22- EKG was not ordered 04/09/20- NSR with sinus arrhythmia  Recent Labs: 06/02/2022: BUN 12; Creatinine, Ser 0.71; Hemoglobin 14.4; Platelets 184; Potassium 4.1; Sodium 138   Recent Lipid Panel    Component Value Date/Time   CHOL 199 12/09/2018 0352   TRIG 110 12/09/2018 0352   HDL 45 12/09/2018 0352   CHOLHDL 4.4 12/09/2018 0352   VLDL 22 12/09/2018 0352   LDLCALC 132 (H) 12/09/2018 0352     Physical Exam:    VS:  BP (!) 168/88 (BP Location: Left Arm, Patient Position: Sitting, Cuff Size: Normal)   Pulse 83   Ht 5\' 5"  (1.651 m)   Wt 116 lb 12.8 oz (53 kg)   SpO2 98%   BMI 19.44 kg/m     Wt Readings from Last 3 Encounters:  05/03/23 116 lb 12.8 oz (53 kg)  10/27/22 118 lb 9.6 oz (53.8 kg)  09/10/22 118 lb 1.6 oz (53.6 kg)   GEN: Well nourished, well developed in no acute distress HEENT: Normal, moist mucous membranes NECK: No JVD CARDIAC: regular rhythm, normal S1 and S2, no rubs or gallops. No murmur. VASCULAR: Radial and DP pulses 2+ bilaterally. No carotid bruits RESPIRATORY:  Clear to auscultation without rales, wheezing or rhonchi  ABDOMEN: Soft, non-tender, non-distended MUSCULOSKELETAL:  Ambulates independently SKIN: Warm and dry, no edema NEUROLOGIC:  Alert and oriented x 3. No focal neuro deficits noted. PSYCHIATRIC:  Normal affect    ASSESSMENT:    1. Coronary artery disease involving native coronary artery of native heart without angina pectoris   2. Essential hypertension  3. History of stroke   4. Atypical chest pain   5. Dyspnea on exertion   6. Heart palpitations     PLAN:    CAD s/p PCI 05/2022 -pRCA lesion 75% stenosed, s/p PCI with DES. Only other disease was trivial in the distal LAD and distal Lcx (10%) -continue atorvastatin -history of being a clopidogrel nonresponder.  -continue aspirin and ticagrelor 90 mg BID for one year.  -long term ticagrelor dose needs to be determined by Dr. Corliss Skains. Was previously on ticagrelor 60 mg BID before PCI. After one year, reasonable from a cardiac standpoint to return to this dosing, but need to make sure this is reasonable from interventional radiology standpoint. -she has been disappointed because she expected to feel remarkably better after receiving a stent. We discussed this at length today. I suspect there are multifactorial components to many of her symptoms. -given concern for etiology  of her symptoms she underwent lexiscan myoview, which was very reassuring.   Hypertension: labile readings -typically higher in office, use home numbers for medication adjustments -was running symptomatically low at home, better with cessation of lisinopril and amlodipine -did not tolerate diltiazem for pSVT, changed to metoprolol  History of CVA, with need for secondary prevention: -on aspirin, ticagrelor. Ticagrelor duration per interventional neurology -on high intensity atorvastatin -echo 12/09/18 was normal, negative bubble study  PFO -incidental on CT. Negative bubble study on prior echo. -history of stroke, but due to ischemia/stenosis  Palpitations, tachycardia: -monitor shows paroxysmal SVT   Cardiac risk counseling and prevention recommendations: -recommend heart healthy/Mediterranean diet, with whole grains, fruits, vegetable, fish, lean meats, nuts, and olive oil. Limit salt. -recommend moderate walking, 3-5 times/week for 30-50 minutes each session. Aim for at least 150 minutes.week. Goal should be pace of 3 miles/hours, or walking 1.5 miles in 30 minutes -recommend avoidance of tobacco products. Avoid excess alcohol.   Plan for follow up: 6 months or sooner as needed   I,Mathew Stumpf,acting as a scribe for [employer], MD.,have documented all relevant documentation on the behalf of Shannon Red, MD,as directed by  Shannon Red, MD while in the presence of Shannon Red, MD.  I, Shannon Red, MD, have reviewed all documentation for this visit. The documentation on 05/03/23 for the exam, diagnosis, procedures, and orders are all accurate and complete.   Signed, Shannon Red, MD PhD 05/03/2023    [facility]

## 2023-05-03 ENCOUNTER — Ambulatory Visit (INDEPENDENT_AMBULATORY_CARE_PROVIDER_SITE_OTHER): Payer: Medicare Other | Admitting: Cardiology

## 2023-05-03 ENCOUNTER — Encounter (HOSPITAL_BASED_OUTPATIENT_CLINIC_OR_DEPARTMENT_OTHER): Payer: Self-pay | Admitting: Cardiology

## 2023-05-03 VITALS — BP 168/88 | HR 83 | Ht 65.0 in | Wt 116.8 lb

## 2023-05-03 DIAGNOSIS — I2510 Atherosclerotic heart disease of native coronary artery without angina pectoris: Secondary | ICD-10-CM

## 2023-05-03 DIAGNOSIS — I10 Essential (primary) hypertension: Secondary | ICD-10-CM

## 2023-05-03 DIAGNOSIS — Z8673 Personal history of transient ischemic attack (TIA), and cerebral infarction without residual deficits: Secondary | ICD-10-CM | POA: Diagnosis not present

## 2023-05-03 DIAGNOSIS — R002 Palpitations: Secondary | ICD-10-CM

## 2023-05-03 DIAGNOSIS — R0609 Other forms of dyspnea: Secondary | ICD-10-CM

## 2023-05-03 DIAGNOSIS — R0789 Other chest pain: Secondary | ICD-10-CM | POA: Diagnosis not present

## 2023-05-03 NOTE — Patient Instructions (Signed)
Medication Instructions:  Your physician recommends that you continue on your current medications as directed. Please refer to the Current Medication list given to you today.  Follow-Up: At [facility], you and your health needs are our priority.  As part of our continuing mission to provide you with exceptional heart care, we have created designated Provider Care Teams.  These Care Teams include your primary Cardiologist (physician) and Advanced Practice Providers (APPs -  Physician Assistants and Nurse Practitioners) who all work together to provide you with the care you need, when you need it.  We recommend signing up for the patient portal called "MyChart".  Sign up information is provided on this After Visit Summary.  MyChart is used to connect with patients for Virtual Visits (Telemedicine).  Patients are able to view lab/test results, encounter notes, upcoming appointments, etc.  Non-urgent messages can be sent to your provider as well.   To learn more about what you can do with MyChart, go to [email or web address].    Your next appointment:   6 month(s)  Provider:   Bridgette Christopher, MD    

## 2023-06-03 ENCOUNTER — Other Ambulatory Visit: Payer: Self-pay | Admitting: Physician Assistant

## 2023-06-03 MED ORDER — TICAGRELOR 60 MG PO TABS: 60.0000 mg | ORAL_TABLET | Freq: Two times a day (BID) | ORAL | 5 refills | Status: DC

## 2023-06-13 ENCOUNTER — Telehealth (HOSPITAL_COMMUNITY): Payer: Self-pay

## 2023-06-13 ENCOUNTER — Other Ambulatory Visit (HOSPITAL_COMMUNITY): Payer: Self-pay | Admitting: Interventional Radiology

## 2023-06-13 DIAGNOSIS — I771 Stricture of artery: Secondary | ICD-10-CM

## 2023-06-13 NOTE — Telephone Encounter (Signed)
Called to schedule cta, no answer, left vm. AB 

## 2023-06-15 IMAGING — DX DG CHEST 1V PORT
1 series · 1 of 1 positions shown · non-contrast
Comparison: 12/26/2018

CLINICAL DATA: Chest pressure

EXAM:
PORTABLE CHEST 1 VIEW

[chest]
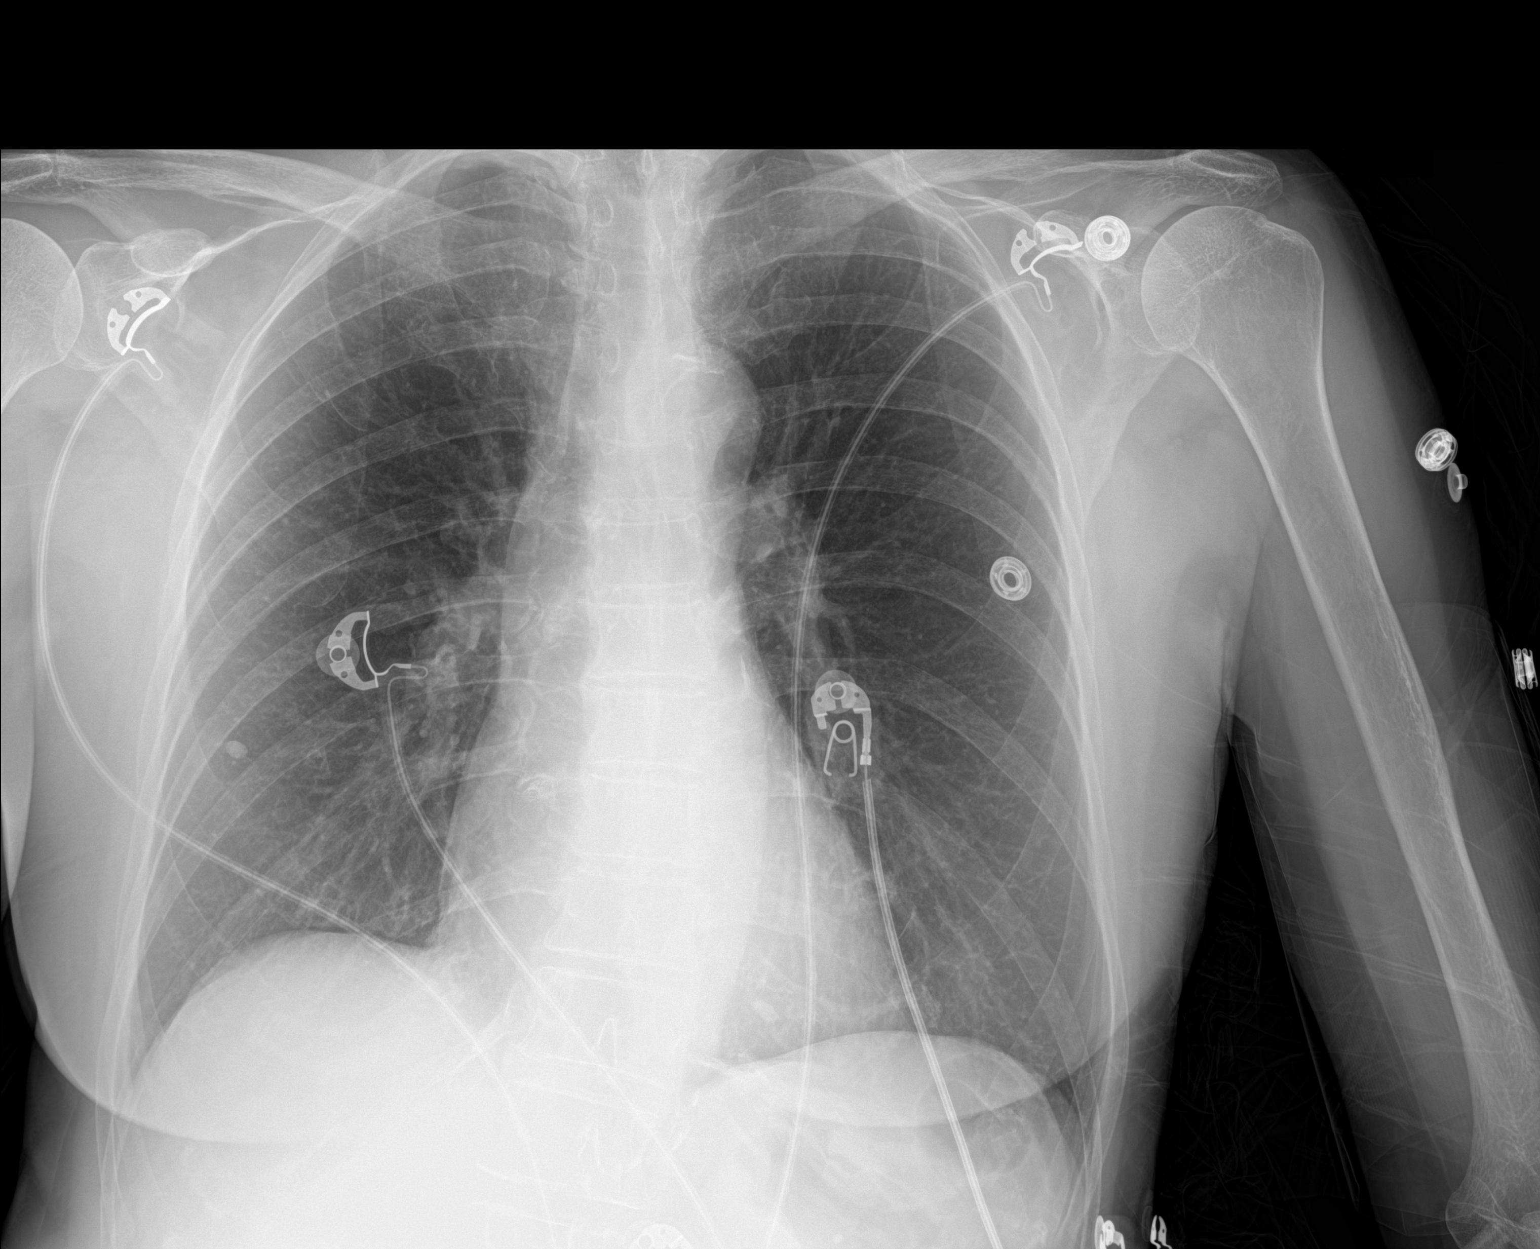

[1 of 1 positions shown; findings below may reference images not displayed]

FINDINGS: The heart size and mediastinal contours are within normal limits. No
acute airspace opacity. Benign, calcified small nodules of the right
midlung. The visualized skeletal structures are unremarkable.
IMPRESSION: No acute abnormality of the lungs in AP portable projection.

## 2023-06-15 IMAGING — CT CT ANGIO HEAD-NECK (W OR W/O PERF)
1 of 11 series · 5 of 33 positions shown · IV contrast (OMNI 350)
Comparison: CTA 4944, CT head 0334

CLINICAL DATA: Headache, new or worsening (Age >= 50y)

EXAM:
CT ANGIOGRAPHY HEAD AND NECK
TECHNIQUE: Multidetector CT imaging of the head and neck was performed using
the standard protocol during bolus administration of intravenous
contrast. Multiplanar CT image reconstructions and MIPs were
obtained to evaluate the vascular anatomy. Carotid stenosis
measurements (when applicable) are obtained utilizing NASCET
criteria, using the distal internal carotid diameter as the
denominator.

[Series 7: cta neck axial · axial · 0.39mm/px · z∈[-239,-9]mm · 5 of 346 slices shown]
[im 58/346  soft-tissue]
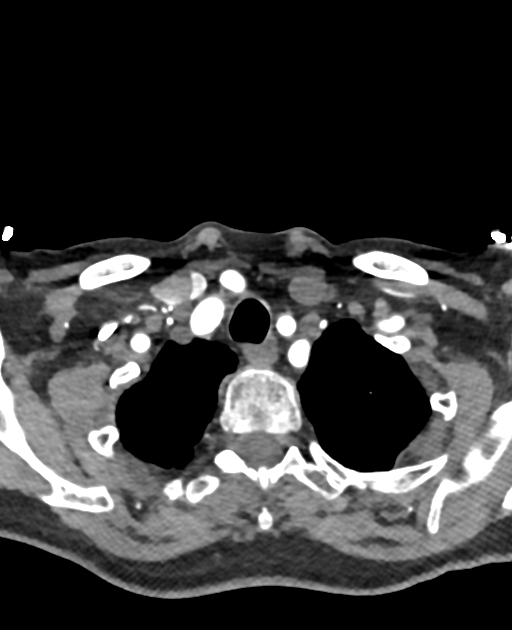
[im 116/346  bone]
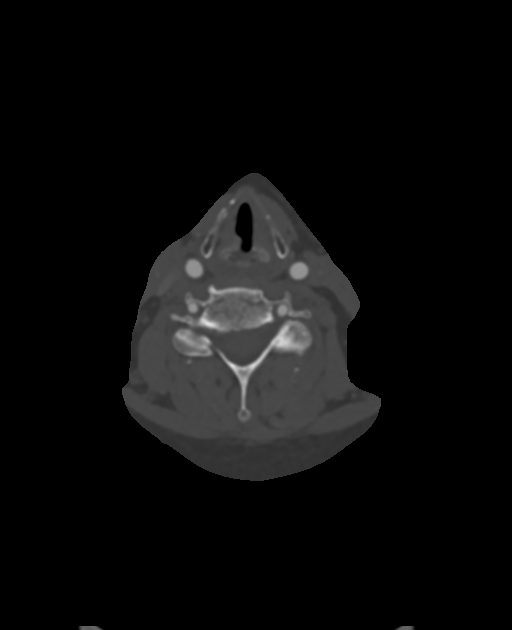
[im 173/346  soft-tissue]
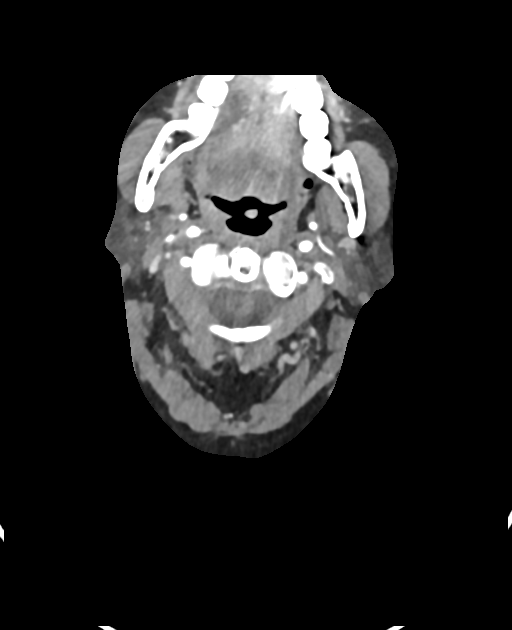
[im 231/346  bone]
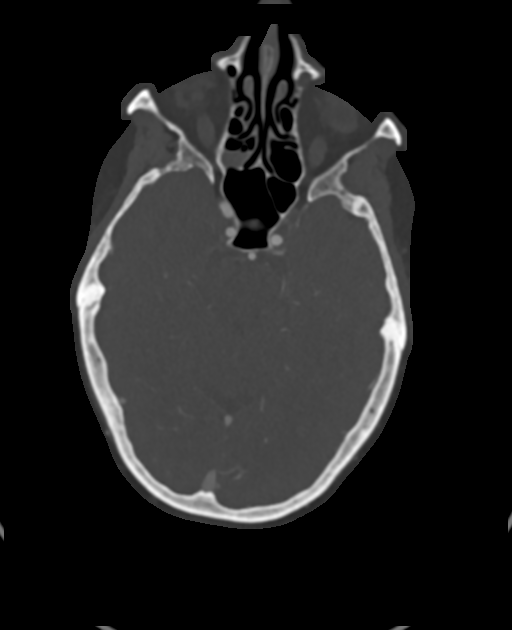
[im 288/346  soft-tissue]
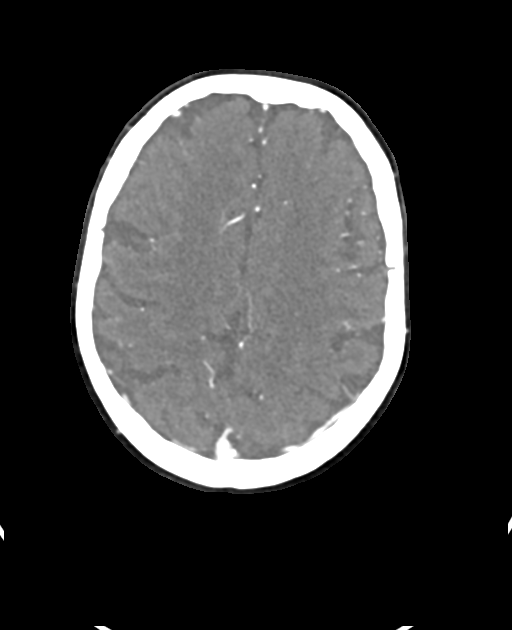

[5 of 33 positions shown; findings below may reference images not displayed]

RADIATION DOSE REDUCTION: This exam was performed according to the
departmental dose-optimization program which includes automated
exposure control, adjustment of the mA and/or kV according to
patient size and/or use of iterative reconstruction technique.

CONTRAST:  50mL OMNIPAQUE IOHEXOL 350 MG/ML SOLN
FINDINGS: CT HEAD

Brain: There is no acute intracranial hemorrhage, mass effect, or
edema. No new loss of gray-white differentiation. Chronic infarcts
of bilateral basal ganglia and adjacent white matter. Minimal
additional patchy low-density in the supratentorial white matter is
nonspecific but may reflect minor chronic microvascular ischemic
changes. There is no extra-axial fluid collection. Ventricles and
sulci are within normal limits in size and configuration.

Vascular: No hyperdense vessel. Stents are present along the right
MCA.

Skull: Calvarium is unremarkable.

Sinuses/Orbits: Mild patchy mucosal thickening. Bilateral lens
replacements.

Other: Mastoid air cells are clear.

Review of the MIP images confirms the above findings

CTA NECK

Aortic arch: Great vessel origins are patent with mixed plaque near
the subclavian origin. Stable less than 50% stenosis of the proximal
left subclavian.

Right carotid system: Patent.  No stenosis.

Left carotid system: Patent. Eccentric plaque at the ICA origin
without stenosis.

Vertebral arteries: Codominant. Plaque at the right vertebral origin
causes stable mild stenosis. Patent stent at the left vertebral
origin with stable mild in-stent narrowing.

Skeleton: Degenerative changes of the cervical spine similar to
prior study.

Other neck: No acute abnormality.

Upper chest: No apical lung mass.

Review of the MIP images confirms the above findings

CTA HEAD

Anterior circulation: Intracranial internal carotid arteries are
patent with atherosclerotic irregularity. There is up to moderate
stenosis beyond the clinoid. Stents are present from the distal
supraclinoid right ICA into a M2 branch. Stents remain patent with
unchanged areas of vessel irregularity and stenosis. Left middle and
both anterior cerebral arteries are patent.

Posterior circulation: Intracranial vertebral arteries are patent.
Basilar artery is patent. Posterior cerebral arteries are patent.

Venous sinuses: Patent as allowed by contrast bolus timing.

Review of the MIP images confirms the above findings
IMPRESSION: No acute intracranial abnormality.  No large vessel occlusion.

Patent left vertebral origin stent with similar mild in-stent
narrowing.

Patent right supraclinoid ICA to M2 MCA stents with similar
appearance of areas of irregularity and stenosis.

## 2023-06-16 ENCOUNTER — Encounter (HOSPITAL_BASED_OUTPATIENT_CLINIC_OR_DEPARTMENT_OTHER): Payer: Self-pay | Admitting: Cardiology

## 2023-06-25 LAB — LAB REPORT - SCANNED: EGFR: 82

## 2023-06-28 ENCOUNTER — Other Ambulatory Visit: Payer: Self-pay | Admitting: Radiology

## 2023-06-28 ENCOUNTER — Other Ambulatory Visit (HOSPITAL_COMMUNITY): Payer: Self-pay | Admitting: Interventional Radiology

## 2023-06-28 ENCOUNTER — Ambulatory Visit (HOSPITAL_COMMUNITY)
Admission: RE | Admit: 2023-06-28 | Discharge: 2023-06-28 | Disposition: A | Discharge status: Home / Self Care | Payer: Medicare Other | Source: Ambulatory Visit | Attending: Interventional Radiology | Admitting: Interventional Radiology

## 2023-06-28 ENCOUNTER — Other Ambulatory Visit (HOSPITAL_COMMUNITY): Payer: Self-pay

## 2023-06-28 ENCOUNTER — Telehealth: Payer: Self-pay | Admitting: Radiology

## 2023-06-28 DIAGNOSIS — I771 Stricture of artery: Secondary | ICD-10-CM

## 2023-06-28 MED ORDER — TICAGRELOR 60 MG PO TABS: 60.0000 mg | ORAL_TABLET | Freq: Two times a day (BID) | ORAL | 5 refills | Status: DC

## 2023-06-28 MED ORDER — IOHEXOL 350 MG/ML SOLN
75.0000 mL | Freq: Once | INTRAVENOUS | Status: AC | PRN
  Administered 2023-06-28: 75 mL via INTRAVENOUS

## 2023-06-28 MED ORDER — SODIUM CHLORIDE (PF) 0.9 % IJ SOLN
INTRAMUSCULAR | Status: AC
  Filled 2023-06-28: qty 50

## 2023-06-28 NOTE — Telephone Encounter (Signed)
Request received for patient's brillinta prescription to be sent to [facility]. Prescription sent as requested. Patient states that she has less than 2 weeks of medication left. Second prescription sent to local pharmacy in case the Patient does not get her medication in time. Patient verbalized understanding and is in agreement with the plan of care

## 2023-07-01 ENCOUNTER — Ambulatory Visit (HOSPITAL_COMMUNITY)
Admission: RE | Admit: 2023-07-01 | Discharge: 2023-07-01 | Disposition: A | Discharge status: Home / Self Care | Payer: Medicare Other | Source: Ambulatory Visit | Attending: Interventional Radiology | Admitting: Interventional Radiology

## 2023-07-01 DIAGNOSIS — I771 Stricture of artery: Secondary | ICD-10-CM

## 2023-07-05 HISTORY — PX: IR RADIOLOGIST EVAL & MGMT: IMG5224

## 2023-07-06 ENCOUNTER — Telehealth (HOSPITAL_COMMUNITY): Payer: Self-pay

## 2023-07-06 NOTE — Telephone Encounter (Signed)
Hi Shannon [name]! Shannon [name] just got her prescription from [facility] for her Brilinta. The bottle says 60mg  twice and day and she just wanted to verify that that's what she is supposed to be taking.   1:48 PM KM Hoyt Koch, [place] She has been taking 1/2 tablet and can continue that. Do you need me to call her or are you ok to report that back to her?  2:10 PM So she only needs to take 30mg  twice a day?  2:12 PM KM Hoyt Koch, PA Correct. We've been prescribing the 60mg  tablets and she takes 1/2 twice daily  2:14 PM perfect, thank you   I'll let her know   2:14 PM KM Hoyt Koch, PA Thank you!!

## 2023-07-18 ENCOUNTER — Telehealth: Payer: Self-pay | Admitting: Student

## 2023-07-18 NOTE — Telephone Encounter (Signed)
Patient contacted NIR help line regarding concerns that she was having. Today, the patient reports hitting her head on 07/09/23. Since that time, she reports feeling short of breath, "woozy," with dizziness, headache, and generalized body weakness. Patient was concerned that symptoms have lasted until today and contacted NIR help line. Patient was informed that her reported symptoms are concerning and that she should present to ED for further evaluation. Patient voiced her understanding and reports that her husband would be able to take her to ED for further evaluation.  Kennieth Francois, PA-C 07/18/2023

## 2023-08-18 ENCOUNTER — Telehealth: Payer: Self-pay | Admitting: Cardiology

## 2023-08-18 NOTE — Telephone Encounter (Signed)
STAT if HR is under 50 or over 120 (normal HR is 60-100 beats per minute)  What is your heart rate? 50 after her walk about 15 minutes ago (10:13am)    Do you have a log of your heart rate readings (document readings)?  129/74 hr 47 - this morning  141/75 hr 64 - 9/4 134/64 hr 59 - 9/3 120/71 hr 66 - 9/2 131/64 hr 65 - 9/1    Do you have any other symptoms? Lightheaded  Pt called in concerned about her hr being low. Please advise.

## 2023-08-18 NOTE — Telephone Encounter (Signed)
Returned call to patient,   Patient states back in July and August she started having shortness of breath. She states at first she chalked it up to the hot weather. She states however this morning when she went out to walk, she still had issues with breathing. She states in July she had a CT scan and he changed her Brilinta from 180 daily to 60 daily. She wonders if this could be a problem. She states she contacted that Dr. And the end of July, they told her it would not be related. She did find out she cracked her temple after hitting her head on the car. After this she was struggling to be able to do her full walks. She normally does a half mile morning and afternoon. She was sent to the ED for her shortness of breath, and was cleared minus some blood counts being slightly  low. Around mid August she started noting low Bps & HR. She has many other concerns that have been ongoing and is requesting to be seen. Offered next available appointment, 10/10 with Edd Fabian, NP- this does not work for the patient and she will keep her appointment that is already scheduled.

## 2023-08-25 ENCOUNTER — Telehealth (HOSPITAL_COMMUNITY): Payer: Self-pay | Admitting: Student

## 2023-08-25 NOTE — Telephone Encounter (Signed)
Brilinta prescription faxed to the patient's pharmacy.  60 mg BID dispense 60 with 3 refills.  Alwyn Ren, [place] [phone] 08/25/2023, 1:13 PM

## 2023-08-29 ENCOUNTER — Telehealth: Payer: Self-pay | Admitting: Radiology

## 2023-08-29 NOTE — Telephone Encounter (Signed)
Patient called with several questions:  1.She was receiving emails from her pharmacy ([facility]) stating that she needs a new prescription for Brilinta. Per EPIC notes a new prescription was sent to her pharmacy on 9.12.24. Patient states that she has 3 bottles of Brilinta on hand at this time. The most recent prescription received within the last week. Recommend Patient talk directly to her Pharmacy to confirm new prescription was received.   2.  Requesting clarification of her dosage. Reinforced education that she should be taking Brilinta 60 mg (1/2 tablet) BID. Patient verbalized understanding.    3. [place] 18, 2023. Patient concerned her WBC and her RDW was low at that time. Suggestion she follow up with her PCP.

## 2023-09-01 ENCOUNTER — Other Ambulatory Visit: Payer: Self-pay | Admitting: Student

## 2023-09-01 DIAGNOSIS — I639 Cerebral infarction, unspecified: Secondary | ICD-10-CM

## 2023-09-01 MED ORDER — TICAGRELOR 60 MG PO TABS: 30.0000 mg | ORAL_TABLET | Freq: Two times a day (BID) | ORAL | Status: DC

## 2023-09-01 NOTE — Telephone Encounter (Signed)
Patient called with ongoing issues regarding Brilinta prescription.  She now gets her Brilinta prescribed through [facility] and requests the script be sent to their processing pharmacy.   Prescription was updated to the correct dosing Brilinta 30mg  (1/2 60mg  tablet) BID and sent to [facility] in [place], [place] [address] per the prescribing instructions via [email or web address].  Patient notified.   Loyce Dys, MS RD PA-C

## 2023-10-28 ENCOUNTER — Other Ambulatory Visit (HOSPITAL_BASED_OUTPATIENT_CLINIC_OR_DEPARTMENT_OTHER): Payer: Self-pay

## 2023-10-28 DIAGNOSIS — I25118 Atherosclerotic heart disease of native coronary artery with other forms of angina pectoris: Secondary | ICD-10-CM

## 2023-10-28 MED ORDER — NITROGLYCERIN 0.4 MG SL SUBL: 0.4000 mg | SUBLINGUAL_TABLET | SUBLINGUAL | 3 refills | Status: DC | PRN

## 2023-11-04 ENCOUNTER — Encounter (HOSPITAL_BASED_OUTPATIENT_CLINIC_OR_DEPARTMENT_OTHER): Payer: Self-pay | Admitting: Family

## 2023-11-04 ENCOUNTER — Ambulatory Visit (INDEPENDENT_AMBULATORY_CARE_PROVIDER_SITE_OTHER): Payer: Medicare Other | Admitting: Family

## 2023-11-04 ENCOUNTER — Ambulatory Visit (HOSPITAL_BASED_OUTPATIENT_CLINIC_OR_DEPARTMENT_OTHER): Payer: Medicare Other | Admitting: Cardiology

## 2023-11-04 VITALS — BP 144/80 | HR 64 | Ht 65.0 in | Wt 119.0 lb

## 2023-11-04 DIAGNOSIS — I2510 Atherosclerotic heart disease of native coronary artery without angina pectoris: Secondary | ICD-10-CM

## 2023-11-04 DIAGNOSIS — I10 Essential (primary) hypertension: Secondary | ICD-10-CM | POA: Diagnosis not present

## 2023-11-04 DIAGNOSIS — R42 Dizziness and giddiness: Secondary | ICD-10-CM

## 2023-11-04 DIAGNOSIS — Q2112 Patent foramen ovale: Secondary | ICD-10-CM

## 2023-11-04 DIAGNOSIS — E785 Hyperlipidemia, unspecified: Secondary | ICD-10-CM

## 2023-11-04 DIAGNOSIS — I471 Supraventricular tachycardia, unspecified: Secondary | ICD-10-CM

## 2023-11-04 NOTE — Progress Notes (Signed)
Cardiology Office Note:  .   Date:  11/04/2023  ID:  Comfort, Emde 09/03/1946, MRN [id number] PCP: Storm Frisk, MD  [facility] Providers Cardiologist:  Jodelle Red, MD    History of Present Illness: .   Shannon [name] is a 77 y.o. female  with a hx of hypertension, CVA, CAD.   Prior cardiac cath (date unknown) with reported 30% RCA lesion. Prior echo 2019 with normal LVEF, negative bubble study. Previous ETT 02/26/21 with fair exercise tolerance, 1mm up-sloping to horizontal ST depressions in inferior lateral leads of borderline significance.  Due to chest pain cardiac CTA performed 12/03/2021 with coronary calcium score 150 placing her in the 67th percentile for age and sex matched control.  She had noncalcified plaques in the proximal RCA with moderate 50 to 69% stenosis, calcified plaque in the proximal RCA, LAD, proximal LCx with 0 to 24% stenosis.  Noted PFO.  FFR on RCA of 0.8 suggesting borderline likelihood of hemodynamic significance.  She was started on Imdur but did not tolerate.  Previously did not tolerate increased doses of metoprolol.   At follow up 05/20/22 noted exertional dyspnea and chest pain. Cardiac cath ordered and performed with PCI/DES to prox RCA, nonobstructive disease (dist LAD, mid-dist Cx 10%). Discharged on Brilinta 90mg  BID for at least 6 months though 3 months could be considered if bleeding issues.     Seen 08/27/22 by Marjie Skiff, PA due to chest pain. Subsequent myoview 08/24/22 low risk study, no evidence of ischemia nor infarction, EF 68%.    Presents today for follow up independently. Reports having a "rough" summer. She hit her head on a cooler in July requiring ED workup diagnosed with concussion but "everything heart came out fine". Noted low WBC, RDW however were similar to previous. Notes more recent episodes of hypotension over th elast week with home readings 95/56, 99/57, 96/64. Notes her husband has been sick for 3 weeks so  has been taking care of him and her 77 year old puppy which was stressful. Her primary care provider has since started her on Lexapro which she started 10/27/23. A few days later when walking to her kitchen in the evening felt her heart racing, tired, weak, headache  BP 179/79 with heart rate of 70 a few minutes later 149/74 and HR 55. The next morning BP 82/56 and HR 52. Called her PCP Tuesday who felt she could still take the Lexapro but she has not taken since that time. Sees PCP 12/02/23 for follow up.   07/18/23 wbc 4.0, hb 14.5, hct 41, rdw 11.8, plt 155 07/18/23 Na 140, K 4.2, Ca 10,0, ALT 24, AST 23, GFR 86, creatinine 0.72, magnesium 2.3  ROS: Please see the history of present illness.    All other systems reviewed and are negative.   Studies Reviewed: Marland Kitchen   EKG Interpretation Date/Time:  Friday November 04 2023 09:25:08 EST Ventricular Rate:  61 PR Interval:  162 QRS Duration:  76 QT Interval:  408 QTC Calculation: 410 R Axis:   47  Text Interpretation: Normal sinus rhythm Normal ECG When compared with ECG of 02-Jun-2022 11:58, No significant change was found Confirmed by Gillian Shields ([id number]) on 11/04/2023 10:02:48 AM    Cardiac Studies & Procedures   CARDIAC CATHETERIZATION  CARDIAC CATHETERIZATION 05/31/2022  Narrative   Prox RCA lesion is 75% stenosed.  A drug-eluting stent was successfully placed using a SYNERGY XD 3.0X16, postdilated to 3.25 mm and optimized with IVUS.  Post intervention, there is a 0% residual stenosis.   Dist LAD lesion is 10% stenosed.   Mid Cx to Dist Cx lesion is 10% stenosed.   The left ventricular systolic function is normal.   LV end diastolic pressure is normal.   The left ventricular ejection fraction is 55-65% by visual estimate.   There is no aortic valve stenosis.  Successful PCI of the proximal RCA.  Patient was on a very low-dose of Brilinta in the past.  Apparently, she was a Plavix nonresponder.  She will need to go back to Brilinta 90  mg twice a day for 6 months, ideally.  Could consider 3 months if there were bleeding issues.  After that time, her Brilinta could be reduced.  Will CC Dr. Corliss Skains.  Continue aggressive secondary prevention.  Findings Coronary Findings Diagnostic  Dominance: Right  Left Anterior Descending Dist LAD lesion is 10% stenosed.  Left Circumflex Mid Cx to Dist Cx lesion is 10% stenosed.  Right Coronary Artery Prox RCA lesion is 75% stenosed. Ultrasound (IVUS) was performed. Cross-sectional area: 2.52 mm.  Severe plaque burden was detected.  Intervention  Prox RCA lesion Stent CATH LAUNCHER 6FR JR4 guide catheter was inserted. Lesion crossed with guidewire using a WIRE ASAHI PROWATER 180CM. Pre-stent angioplasty was not performed. A drug-eluting stent was successfully placed using a SYNERGY XD 3.0X16. Stent strut is well apposed. Post-stent angioplasty was performed using a BALL SAPPHIRE NC24 3.25X12. Post-Intervention Lesion Assessment The intervention was successful. Pre-interventional TIMI flow is 3. Post-intervention TIMI flow is 3. No complications occurred at this lesion. Ultrasound (IVUS) was performed on the lesion post PCI using a CATH OPTICROSS HD. Stent well apposed. There is a 0% residual stenosis post intervention.   STRESS TESTS  MYOCARDIAL PERFUSION IMAGING 09/02/2022  Narrative   The study is normal. The study is low risk based on normal perfusion.   Severely elevated pre-test BP of 201/92 mmHg.   LV perfusion is normal. There is no evidence of ischemia. There is no evidence of infarction.   Left ventricular function is normal. Nuclear stress EF: 68 %. The left ventricular ejection fraction is hyperdynamic (>65%). End diastolic cavity size is normal. End systolic cavity size is normal.   Prior study not available for comparison.   ECHOCARDIOGRAM  ECHOCARDIOGRAM COMPLETE BUBBLE STUDY 12/09/2018  Narrative *[facility]* [address] [place], [place] [address] [phone]  ------------------------------------------------------------------- Transthoracic Echocardiography  Patient:    Karl, Staunton MR #:       [id number] Study Date: 12/09/2018 Gender:     F Age:        72 Height:     165.1 cm Weight:     51.7 kg BSA:        1.53 m^2 Pt. Status: Room:       116A  ORDERING     Pola Corn REFERRING    Thana Farr D SONOGRAPHER  [facility] Mucker PERFORMING   [facility] ADMITTING    Pyreddy, Pavan ATTENDING    Mayo, Allyn Kenner  cc:  ------------------------------------------------------------------- LV EF: 60% -   65%  ------------------------------------------------------------------- Indications:      CVA.  ------------------------------------------------------------------- History:   Risk factors:  Hypertension.  ------------------------------------------------------------------- Study Conclusions  - Left ventricle: The cavity size was normal. Systolic function was normal. The estimated ejection fraction was in the range of 60% to 65%. Wall motion was normal; there were no regional wall motion abnormalities. Left ventricular diastolic function parameters were normal. -  Left atrium: The atrium was normal in size. - Right ventricle: Systolic function was normal. - Atrial septum: No defect or patent foramen ovale was identified. Echo contrast study showed no right-to-left atrial level shunt, at baseline or with provocation. - Pulmonary arteries: Systolic pressure was within the normal range.  ------------------------------------------------------------------- Study data:   Study status:  Routine.  Procedure:  Transthoracic echocardiography. Image quality was adequate.  Study completion: There were no complications.          Transthoracic echocardiography.  M-mode, complete 2D, spectral Doppler, and color Doppler.  Birthdate:  Patient birthdate: Sep 25, 1946.  Age:  Patient is 77 yr old.   Sex:  Gender: female.    BMI: 19 kg/m^2.  Blood pressure:     163/66  Patient status:  Inpatient.  Study date: Study date: 12/09/2018. Study time: 01:16 PM.  Location:  Bedside.  -------------------------------------------------------------------  ------------------------------------------------------------------- Left ventricle:  The cavity size was normal. Systolic function was normal. The estimated ejection fraction was in the range of 60% to 65%. Wall motion was normal; there were no regional wall motion abnormalities. The transmitral flow pattern was normal. The deceleration time of the early transmitral flow velocity was normal. The pulmonary vein flow pattern was normal. The tissue Doppler parameters were normal. Left ventricular diastolic function parameters were normal.  ------------------------------------------------------------------- Aortic valve:  Poorly visualized.  Trileaflet; normal thickness leaflets. Mobility was not restricted.  Doppler:  Transvalvular velocity was within the normal range. There was no stenosis. There was no regurgitation.  ------------------------------------------------------------------- Aorta:  Aortic root: The aortic root was normal in size.  ------------------------------------------------------------------- Mitral valve:   Structurally normal valve.   Mobility was not restricted.  Doppler:  Transvalvular velocity was within the normal range. There was no evidence for stenosis. There was no regurgitation.    Valve area by pressure half-time: 3.86 cm^2. Indexed valve area by pressure half-time: 2.52 cm^2/m^2.  ------------------------------------------------------------------- Left atrium:  The atrium was normal in size.  ------------------------------------------------------------------- Atrial septum:  No defect or patent foramen ovale was identified. Echo contrast study showed no right-to-left atrial level shunt, at baseline or with  provocation.  ------------------------------------------------------------------- Right ventricle:  The cavity size was normal. Wall thickness was normal. Systolic function was normal.  ------------------------------------------------------------------- Pulmonic valve:    Structurally normal valve.   Cusp separation was normal.  Doppler:  Transvalvular velocity was within the normal range. There was no evidence for stenosis. There was no regurgitation.  ------------------------------------------------------------------- Tricuspid valve:   Structurally normal valve.    Doppler: Transvalvular velocity was within the normal range. There was no regurgitation.  ------------------------------------------------------------------- Pulmonary artery:   The main pulmonary artery was normal-sized. Systolic pressure was within the normal range.  ------------------------------------------------------------------- Right atrium:  The atrium was normal in size.  ------------------------------------------------------------------- Pericardium:  There was no pericardial effusion.  ------------------------------------------------------------------- Systemic veins: Inferior vena cava: The vessel was normal in size.  ------------------------------------------------------------------- Measurements  Left ventricle                         Value          Reference LV ID, ED, PLAX chordal        (L)     29.6  mm       43 - 52 LV ID, ES, PLAX chordal        (L)     17.8  mm       23 - 38 LV fx shortening, PLAX chordal  40    %        >=29 LV PW thickness, ED                    8.99  mm       ---------- IVS/LV PW ratio, ED            (H)     1.31           <=1.3 Stroke volume, 2D                      46    ml       ---------- Stroke volume/bsa, 2D                  30    ml/m^2   ---------- LV e&', lateral                         8.81  cm/s     ---------- LV E/e&', lateral                        6.12           ---------- LV e&', medial                          6.85  cm/s     ---------- LV E/e&', medial                        7.87           ---------- LV e&', average                         7.83  cm/s     ---------- LV E/e&', average                       6.88           ----------  Ventricular septum                     Value          Reference IVS thickness, ED                      11.8  mm       ----------  LVOT                                   Value          Reference LVOT ID, S                             18    mm       ---------- LVOT area                              2.54  cm^2     ---------- LVOT peak velocity, S                  81.3  cm/s     ---------- LVOT mean velocity, S  53.9  cm/s     ---------- LVOT VTI, S                            18    cm       ----------  Aorta                                  Value          Reference Aortic root ID, ED                     34    mm       ----------  Left atrium                            Value          Reference LA ID, A-P, ES                         32    mm       ---------- LA ID/bsa, A-P                         2.09  cm/m^2   <=2.2 LA volume, ES, 1-p A4C                 24.1  ml       ---------- LA volume/bsa, ES, 1-p A4C             15.7  ml/m^2   ----------  Mitral valve                           Value          Reference Mitral E-wave peak velocity            53.9  cm/s     ---------- Mitral A-wave peak velocity            102   cm/s     ---------- Mitral deceleration time               194   ms       150 - 230 Mitral pressure half-time              57    ms       ---------- Mitral E/A ratio, peak                 0.5            ---------- Mitral valve area, PHT, DP             3.86  cm^2     ---------- Mitral valve area/bsa, PHT, DP         2.52  cm^2/m^2 ----------  Right atrium                           Value          Reference RA ID, S-I, ES, A4C                    38.7  mm       34 - 49 RA  area, ES,  A4C                       9.1   cm^2     8.3 - 19.5 RA volume, ES, A/L                     17.2  ml       ---------- RA volume/bsa, ES, A/L                 11.2  ml/m^2   ----------  Systemic veins                         Value          Reference Estimated CVP                          3     mm Hg    ----------  Right ventricle                        Value          Reference RV ID, ED, PLAX                        33.8  mm       19 - 38 TAPSE                                  15.4  mm       ---------- RV s&', lateral, S                      14.4  cm/s     ----------  Legend: (L)  and  (H)  mark values outside specified reference range.  ------------------------------------------------------------------- Prepared and Electronically Authenticated by  Dossie Arbour, MD, [facility] 2019-12-28T16:19:55    MONITORS  LONG TERM MONITOR (3-14 DAYS) 08/19/2020  Narrative 7 days of data recorded on Zio monitor. Patient had a min HR of 50 bpm, max HR of 158 bpm, and avg HR of 85 bpm. Predominant underlying rhythm was Sinus Rhythm. No VT, atrial fibrillation, high degree block, or pauses noted. Isolated atrial and ventricular ectopy was rare (<1%). 524 Supraventricular Tachycardia runs occurred, the run with the fastest interval lasting 6 beats with a max rate of 158 bpm, the longest lasting 19 beats with an avg rate of 104 bpm.   CT SCANS  CT CORONARY MORPH W/CTA COR W/SCORE 12/03/2021  Addendum 12/03/2021  4:43 PM ADDENDUM REPORT: 12/03/2021 16:40  CLINICAL DATA:  59F with chest pain  EXAM: Cardiac/Coronary CTA  TECHNIQUE: The patient was scanned on a [employer].  FINDINGS: A 100 kV prospective scan was triggered in the descending thoracic aorta at 111 HU's. Axial non-contrast 3 mm slices were carried out through the heart. The data set was analyzed on a dedicated work station and scored using the Agatson method. Gantry rotation speed was 250 msecs and collimation was .6  mm. 0.8 mg of sl NTG was given. The 3D data set was reconstructed in 5% intervals of the 35-75% of the R-R cycle. Phases were analyzed on a dedicated work station using MPR, MIP and VRT modes. The patient received 80 cc of contrast.  Coronary Arteries:  Normal coronary origin.  Right dominance.  RCA is a large dominant artery that gives rise to PDA and PLA. There is noncalcified plaque in the proximal RCA causing 50-69% stenosis. Calcified plaque in the proximal RCA causes 0-24% stenosis  Left main is a large artery that gives rise to LAD and LCX arteries.  LAD is a large vessel. There is calcified plaque in the proximal LAD causing 0-24% stenosis  LCX is a non-dominant artery that gives rise to one large OM1 branch. There is calcified plaque in the proximal LCX causing 0-24% stenosis  Other findings:  Left Ventricle: Normal size  Left Atrium: Mild enlargement. PFO  Pulmonary Veins: Normal configuration  Right Ventricle: Normal size  Right Atrium: Normal size  Cardiac valves: No calcifications  Thoracic aorta: Normal size  Pulmonary Arteries: Normal size  Systemic Veins: Normal drainage  Pericardium: Normal thickness  IMPRESSION: 1. Coronary calcium score of 150. This was 67th percentile for age and sex matched control.  2.  Normal coronary origin with right dominance.  3. Noncalcified plaque in the proximal RCA causes moderate (50-69%) stenosis  4. Calcified plaque in the proximal RCA, proximal LAD, and proximal LCX causes minimal (0-24%) stenosis  5.  PFO  6.  Will send for CT FFR  CAD-RADS 3. Moderate stenosis. Consider symptom-guided anti-ischemic pharmacotherapy as well as risk factor modification per guideline directed care. Additional analysis with CT FFR will be submitted.   Electronically Signed By: Epifanio Lesches M.D. On: 12/03/2021 16:40  Narrative EXAM: OVER-READ INTERPRETATION  CT CHEST  The following report is an over-read  performed by radiologist Dr. Charlett Nose of [facility] on 12/03/2021. This over-read does not include interpretation of cardiac or coronary anatomy or pathology. The coronary CTA interpretation by the cardiologist is attached.  COMPARISON:  None.  FINDINGS: Vascular: Heart is normal size. Aorta normal caliber. Scattered calcifications in the descending thoracic aorta.  Mediastinum/Nodes: Calcified mediastinal and right hilar lymph nodes. No adenopathy  Lungs/Pleura: Calcified granuloma in the right lower lobe. No confluent airspace opacities or effusions.  Upper Abdomen: Imaging into the upper abdomen demonstrates no acute findings. Calcification in the liver compatible with old granuloma.  Musculoskeletal: Chest wall soft tissues are unremarkable. No acute bony abnormality.  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  Aortic atherosclerosis.  No acute extra cardiac abnormality.  Electronically Signed: By: Charlett Nose M.D. On: 12/03/2021 12:53          Risk Assessment/Calculations:     HYPERTENSION CONTROL Vitals:   11/04/23 0917 11/04/23 1008  BP: (!) 154/82 (!) 144/80    The patient's blood pressure is elevated above target today.  In order to address the patient's elevated BP: The blood pressure is usually elevated in clinic.  Blood pressures monitored at home have been optimal.; Follow up with general cardiology has been recommended.          Physical Exam:   VS:  BP (!) 144/80   Pulse 64   Ht 5\' 5"  (1.651 m)   Wt 119 lb (54 kg)   SpO2 98%   BMI 19.80 kg/m    Wt Readings from Last 3 Encounters:  11/04/23 119 lb (54 kg)  05/03/23 116 lb 12.8 oz (53 kg)  10/27/22 118 lb 9.6 oz (53.8 kg)    Vitals:   11/04/23 0917 11/04/23 1008  BP: (!) 154/82 (!) 144/80  Pulse: 64   Height: 5\' 5"  (1.651 m)   Weight: 119 lb (54 kg)   SpO2: 98%  BMI (Calculated): 19.8     GEN: Well nourished, well developed in no acute distress,  anxious NECK: No JVD; No carotid bruits CARDIAC: RRR, no murmurs, rubs, gallops RESPIRATORY:  Clear to auscultation without rales, wheezing or rhonchi  ABDOMEN: Soft, non-tender, non-distended EXTREMITIES:  No edema; No deformity   ASSESSMENT AND PLAN: .    Coronary artery disease - PCI/DES RCA 05/31/22. 08/2022 low risk myoview. GDMT includes ASA, Brilinta, Atorvastatin, PRN nitroglycerin. Stable with no anginal symptoms. No indication for ischemic evaluation.     Palpitations / Tachycardia / Bradycardia / Sinus pause 1.68 sec - Previous monitor with paroxymal SVT. Continue Toprol 12.5mg  QD. Did not tolerate increased doses nor Diltiazem. Continue to stay well hydrated, avoid caffeine, manage stress well.   PFO -incidental finding on CT.  Negative bubble study on prior echo.  History of stroke but was due to ischemia/stenosis. No indication for closure.    HTN / Aldrete coat hypertension- Labile blood pressure at home. Adjust antihypertensive regimen based on home readings given known Wilinski coat hypertension. Continue Toprol 12.5mg  daily. Anticipate present hypotension related to poor PO intake in setting of caring for her husband - encouraged increase PO intake. Update BMP, CBC, mag to rule out dehydration, anemia. Discussed Lexapro unlikely to cause her present symptoms and could resume.    HLD - 05/2022 LDL 51 by outside labs. Update direct LDL today. Continue Atorvastatin 40mg  daily.    Hypothyroidism - Managed by primary care.    History of CVA - On Aspirin, Brilinta per interventional neurology. Follows with neurology.        Dispo: follow up in 3-4 months  Signed, Alver Sorrow, NP

## 2023-11-04 NOTE — Patient Instructions (Addendum)
Medication Instructions:  Your physician recommends that you continue on your current medications as directed. Please refer to the Current Medication list given to you today.  *If you need a refill on your cardiac medications before your next appointment, please call your pharmacy*  Lab Work: CBC, BMP, Magnesium, and Direct LDL today  If you have labs (blood work) drawn today and your tests are completely normal, you will receive your results only by: MyChart Message (if you have MyChart) OR A paper copy in the mail If you have any lab test that is abnormal or we need to change your treatment, we will call you to review the results.   Follow-Up: At [facility], you and your health needs are our priority.  As part of our continuing mission to provide you with exceptional heart care, we have created designated Provider Care Teams.  These Care Teams include your primary Cardiologist (physician) and Advanced Practice Providers (APPs -  Physician Assistants and Nurse Practitioners) who all work together to provide you with the care you need, when you need it.  We recommend signing up for the patient portal called "MyChart".  Sign up information is provided on this After Visit Summary.  MyChart is used to connect with patients for Virtual Visits (Telemedicine).  Patients are able to view lab/test results, encounter notes, upcoming appointments, etc.  Non-urgent messages can be sent to your provider as well.   To learn more about what you can do with MyChart, go to [email or web address].    Your next appointment:   3-4 month(s)  Provider:   Jodelle Red, MD or Gillian Shields, NP    Other Instructions Your EKG today looked good! Be sure to stay well hydrated and eat regular meals to prevent lightheadedness You can try compression stockings during the daytime to help prevent lightheadedness.

## 2023-11-05 LAB — CBC
Hematocrit: 43.5 % (ref 34.0–46.6)
Hemoglobin: 14.5 g/dL (ref 11.1–15.9)
MCH: 31.7 pg (ref 26.6–33.0)
MCHC: 33.3 g/dL (ref 31.5–35.7)
MCV: 95 fL (ref 79–97)
Platelets: 214 10*3/uL (ref 150–450)
RBC: 4.57 x10E6/uL (ref 3.77–5.28)
RDW: 11.8 % (ref 11.7–15.4)
WBC: 4.9 10*3/uL (ref 3.4–10.8)

## 2023-11-05 LAB — LDL CHOLESTEROL, DIRECT: LDL Direct: 58 mg/dL (ref 0–99)

## 2023-11-05 LAB — BASIC METABOLIC PANEL
BUN/Creatinine Ratio: 21 (ref 12–28)
BUN: 15 mg/dL (ref 8–27)
CO2: 25 mmol/L (ref 20–29)
Calcium: 10.5 mg/dL — ABNORMAL HIGH (ref 8.7–10.3)
Chloride: 100 mmol/L (ref 96–106)
Creatinine, Ser: 0.72 mg/dL (ref 0.57–1.00)
Glucose: 91 mg/dL (ref 70–99)
Potassium: 4.4 mmol/L (ref 3.5–5.2)
Sodium: 139 mmol/L (ref 134–144)
eGFR: 86 mL/min/{1.73_m2} (ref >59)

## 2023-11-05 LAB — MAGNESIUM: Magnesium: 2.5 mg/dL — ABNORMAL HIGH (ref 1.6–2.3)

## 2023-12-21 ENCOUNTER — Ambulatory Visit (HOSPITAL_BASED_OUTPATIENT_CLINIC_OR_DEPARTMENT_OTHER): Payer: Medicare Other | Admitting: Family Medicine

## 2023-12-21 ENCOUNTER — Encounter (HOSPITAL_BASED_OUTPATIENT_CLINIC_OR_DEPARTMENT_OTHER): Payer: Self-pay | Admitting: Family Medicine

## 2023-12-21 VITALS — BP 122/62 | HR 57 | Ht 65.0 in | Wt 118.1 lb

## 2023-12-21 DIAGNOSIS — Z0000 Encounter for general adult medical examination without abnormal findings: Secondary | ICD-10-CM

## 2023-12-21 DIAGNOSIS — I10 Essential (primary) hypertension: Secondary | ICD-10-CM | POA: Diagnosis not present

## 2023-12-21 DIAGNOSIS — E039 Hypothyroidism, unspecified: Secondary | ICD-10-CM

## 2023-12-21 NOTE — Patient Instructions (Signed)
  Medication Instructions:  Your physician recommends that you continue on your current medications as directed. Please refer to the Current Medication list given to you today. --If you need a refill on any your medications before your next appointment, please call your pharmacy first. If no refills are authorized on file call the office.-- Lab Work: Your physician has recommended that you have lab work today: 1 week before next visit  If you have labs (blood work) drawn today and your tests are completely normal, you will receive your results via MyChart message OR a phone call from our staff.  Please ensure you check your voicemail in the event that you authorized detailed messages to be left on a delegated number. If you have any lab test that is abnormal or we need to change your treatment, we will call you to review the results.  Follow-Up: Your next appointment:   Your physician recommends that you schedule a follow-up appointment in: 6 month physical with Dr. de Peru  You will receive a text message or e-mail with a link to a survey about your care and experience with [place] today! We would greatly appreciate your feedback!   Thanks for letting us be apart of your health journey!!  Primary Care and Sports Medicine   Dr. Ceasar Mons Peru   We encourage you to activate your patient portal called "MyChart".  Sign up information is provided on this After Visit Summary.  MyChart is used to connect with patients for Virtual Visits (Telemedicine).  Patients are able to view lab/test results, encounter notes, upcoming appointments, etc.  Non-urgent messages can be sent to your provider as well. To learn more about what you can do with MyChart, please visit --  [email or web address].

## 2023-12-21 NOTE — Progress Notes (Signed)
 New Patient Office Visit  Subjective   Patient ID: Shannon [name], female    DOB: [birth year]  Age: 78 y.o. MRN: [id number]  CC:  Chief Complaint  Patient presents with   New Patient (Initial Visit)    New patient for the last few days her head has felt dizzy she fell in [employer] yesterday supposed to have ct with deveshwar this month has had low pulse rates does see cardiology    HPI Shannon [name] presents to establish care Last PCP - Dr. Lamar Music  Hypothyroidism: last TSH was July 2024, was 2.04. has been well-controlled on levothyroxine , taking alternating dose of Drawbridge 75 mcg and 50 mcg daily.  Denies any concerns today.  Cerebral artery stenosis: Most recent follow-up with interventional radiology in July 2024 with CTA of head and neck completed.  CTA revealed suggestion of possible mild worsening of left MCA distally, right MCA with stented segments that were patent on imaging.  She will be due for follow-up CTA as per interventional radiology recommendations.  She also follows with Dr. Reita for related to recent issues with dizziness  CAD/hyperlipidemia: Patient does follow with cardiology here at [facility].  Current medications include ASA 81, atorvastatin , metoprolol , Brilinta .  No current issues with medications.  She does follow-up as scheduled with cardiology.  Outpatient Encounter Medications as of 12/21/2023  Medication Sig   acetaminophen  (TYLENOL ) 325 MG tablet Take 325 mg by mouth every 6 (six) hours as needed for headache.    aspirin  EC 81 MG EC tablet Take 1 tablet (81 mg total) by mouth daily.   atorvastatin  (LIPITOR) 40 MG tablet Take 1 tablet (40 mg total) by mouth daily at 6 PM.   Calcium  Carb-Cholecalciferol (CALCIUM  600 + D PO) Take 300-600 mg by mouth See admin instructions. Take 600 mg after breakfast, 600 mg after lunch, and 300 mg after dinner   Coenzyme Q10 50 MG CAPS Take 50 mg by mouth daily.    escitalopram  (LEXAPRO ) 5 MG tablet Take 5 mg by  mouth daily.   levothyroxine  (SYNTHROID ) 75 MCG tablet Take 75 mcg by mouth every Tuesday, Thursday, Saturday, and Sunday.    levothyroxine  (SYNTHROID , LEVOTHROID) 50 MCG tablet Take 50 mcg by mouth every Monday, Wednesday, and Friday.    loratadine  (CLARITIN ) 10 MG tablet Take 10 mg by mouth daily.   metoprolol  succinate (TOPROL -XL) 25 MG 24 hr tablet TAKE 1/2 TABLET(12.5 MG) BY MOUTH DAILY   Multiple Vitamin (MULTIVITAMIN) capsule Take 1 capsule by mouth daily. Senior   nitroGLYCERIN  (NITROSTAT ) 0.4 MG SL tablet Place 1 tablet (0.4 mg total) under the tongue every 5 (five) minutes as needed for chest pain.   Polyvinyl Alcohol -Povidone PF (REFRESH) 1.4-0.6 % SOLN Apply 1 drop to eye as needed (dry eye). Both eyes   Propylene Glycol (SYSTANE BALANCE OP) Apply 1 drop to eye as needed (dry eye). Both eyes.   vitamin E  400 UNIT capsule Take 400 Units by mouth every other day.    [DISCONTINUED] ticagrelor  (BRILINTA ) tablet 30 mg    No facility-administered encounter medications on file as of 12/21/2023.    Past Medical History:  Diagnosis Date   Anxiety    Artery stenosis (HCC)    cerebral   Arthritis    Cancer (HCC)    skin - squamous cell   Complication of anesthesia     I had difficulty coming out of it   Dyspnea    with exertion   Family history of adverse  reaction to anesthesia     My daughter has a problem coming ou to it   GERD (gastroesophageal reflux disease)    Headache    migraines in the past   Hypertension    Hypothyroidism    Stroke [facility]) 11/2018   some weakness on right side    SVT (supraventricular tachycardia) (HCC)    Thyroid  disease     Past Surgical History:  Procedure Laterality Date   ABDOMINAL HYSTERECTOMY     APPENDECTOMY     CARDIAC CATHETERIZATION     CARPAL TUNNEL RELEASE Bilateral    COLONOSCOPY     CORONARY STENT INTERVENTION N/A 05/31/2022   Procedure: CORONARY STENT INTERVENTION;  Surgeon: Dann Candyce RAMAN, MD;  Location: [facility] INVASIVE CV  LAB;  Service: Cardiovascular;  Laterality: N/A;   CORONARY ULTRASOUND/IVUS N/A 05/31/2022   Procedure: Intravascular Ultrasound/IVUS;  Surgeon: Dann Candyce RAMAN, MD;  Location: [facility] INVASIVE CV LAB;  Service: Cardiovascular;  Laterality: N/A;   DILATION AND CURETTAGE OF UTERUS     IR ANGIO INTRA EXTRACRAN SEL COM CAROTID INNOMINATE BILAT MOD SED  12/25/2018   IR ANGIO INTRA EXTRACRAN SEL COM CAROTID INNOMINATE BILAT MOD SED  10/30/2019   IR ANGIO INTRA EXTRACRAN SEL COM CAROTID INNOMINATE BILAT MOD SED  09/23/2020   IR ANGIO VERTEBRAL SEL SUBCLAVIAN INNOMINATE UNI L MOD SED  12/25/2018   IR ANGIO VERTEBRAL SEL SUBCLAVIAN INNOMINATE UNI L MOD SED  10/30/2019   IR ANGIO VERTEBRAL SEL SUBCLAVIAN INNOMINATE UNI L MOD SED  09/23/2020   IR ANGIO VERTEBRAL SEL SUBCLAVIAN INNOMINATE UNI R MOD SED  01/04/2019   IR ANGIO VERTEBRAL SEL VERTEBRAL UNI R MOD SED  12/25/2018   IR ANGIO VERTEBRAL SEL VERTEBRAL UNI R MOD SED  10/30/2019   IR ANGIO VERTEBRAL SEL VERTEBRAL UNI R MOD SED  09/23/2020   IR CT HEAD LTD  01/04/2019   IR CT HEAD LTD  11/21/2019   IR INTRA CRAN STENT  01/04/2019   IR INTRA CRAN STENT  11/21/2019   IR RADIOLOGIST EVAL & MGMT  03/12/2021   IR RADIOLOGIST EVAL & MGMT  09/10/2021   IR RADIOLOGIST EVAL & MGMT  01/07/2023   IR RADIOLOGIST EVAL & MGMT  07/05/2023   IR TRANSCATH EXCRAN VERT OR CAR A STENT  11/17/2020   IR US  GUIDE VASC ACCESS RIGHT  12/25/2018   IR US  GUIDE VASC ACCESS RIGHT  11/02/2019   LEFT HEART CATH AND CORONARY ANGIOGRAPHY N/A 05/31/2022   Procedure: LEFT HEART CATH AND CORONARY ANGIOGRAPHY;  Surgeon: Dann Candyce RAMAN, MD;  Location: [facility] INVASIVE CV LAB;  Service: Cardiovascular;  Laterality: N/A;   OVARIAN CYST REMOVAL     RADIOLOGY WITH ANESTHESIA N/A 01/04/2019   Procedure: RMCA angioplasty with possible stenting;  Surgeon: Dolphus Carrion, MD;  Location: [facility] OR;  Service: Radiology;  Laterality: N/A;   RADIOLOGY WITH ANESTHESIA N/A 11/21/2019   Procedure: IR WITH  ANESTHESIA/ STENT PLACEMENT;  Surgeon: Dolphus Carrion, MD;  Location: [facility] OR;  Service: Radiology;  Laterality: N/A;   RADIOLOGY WITH ANESTHESIA N/A 10/01/2020   Procedure: IR WITH ANESTHESIA ANGIOPLASTY WITH POSSIBLE STENTING;  Surgeon: Dolphus Carrion, MD;  Location: [facility] OR;  Service: Radiology;  Laterality: N/A;   RADIOLOGY WITH ANESTHESIA N/A 11/17/2020   Procedure: IR WITH ANESTHESIA STENT PLACEMENT;  Surgeon: Dolphus Carrion, MD;  Location: [facility] OR;  Service: Radiology;  Laterality: N/A;    Family History  Problem Relation Age of Onset   CVA Mother  Heart disease Mother    Peripheral Artery Disease Father    Breast cancer Sister    Breast cancer Sister    CVA Maternal Aunt     Social History   Socioeconomic History   Marital status: Married    Spouse name: Not on file   Number of children: Not on file   Years of education: Not on file   Highest education level: Some college, no degree  Occupational History   Occupation: retired  Tobacco Use   Smoking status: Never    Passive exposure: Never   Smokeless tobacco: Never  Vaping Use   Vaping status: Never Used  Substance and Sexual Activity   Alcohol  use: Never   Drug use: Never   Sexual activity: Yes  Other Topics Concern   Not on file  Social History Narrative   Not on file   Social Drivers of Health   Financial Resource Strain: Low Risk  (12/20/2023)   Overall Financial Resource Strain (CARDIA)    Difficulty of Paying Living Expenses: Not hard at all  Food Insecurity: No Food Insecurity (12/20/2023)   Hunger Vital Sign    Worried About Running Out of Food in the Last Year: Never true    Ran Out of Food in the Last Year: Never true  Transportation Needs: No Transportation Needs (12/20/2023)   PRAPARE - Administrator, Civil Service (Medical): No    Lack of Transportation (Non-Medical): No  Physical Activity: Sufficiently Active (12/20/2023)   Exercise Vital Sign    Days of Exercise per Week: 6  days    Minutes of Exercise per Session: 30 min  Stress: Stress Concern Present (12/20/2023)   [employer] of Occupational Health - Occupational Stress Questionnaire    Feeling of Stress : To some extent  Social Connections: Socially Integrated (12/20/2023)   Social Connection and Isolation Panel [NHANES]    Frequency of Communication with Friends and Family: More than three times a week    Frequency of Social Gatherings with Friends and Family: Patient declined    Attends Religious Services: More than 4 times per year    Active Member of [employer] or Organizations: Yes    Attends Engineer, Structural: More than 4 times per year    Marital Status: Married  Catering Manager Violence: Not on file   Objective   BP 122/62 (BP Location: Left Arm, Patient Position: Sitting, Cuff Size: Normal)   Pulse (!) 57   Ht 5' 5 (1.651 m)   Wt 118 lb 1.6 oz (53.6 kg)   SpO2 100%   BMI 19.65 kg/m   Physical Exam  78 year old female in no acute distress Cardiovascular exam with regular rate and rhythm Lungs clear to auscultation bilaterally  Assessment & Plan:   Primary hypertension Assessment & Plan: Blood pressure at goal in office today.  Recommend continuing with current medication regimen, no changes to be made today.  Recommend continued follow-up with cardiology as scheduled  Orders: -     CBC with Differential/Platelet; Future  Hypothyroidism, unspecified type Assessment & Plan: Clinically euthyroid, can continue with current medication regimen.  We can plan to monitor thyroid  function on upcoming labs   Wellness examination -     CBC with Differential/Platelet; Future -     Comprehensive metabolic panel; Future -     Lipid panel; Future -     TSH; Future  Return in about 6 months (around 06/19/2024) for CPE with fasting labs 1 week  prior.    ___________________________________________ Delmont Prosch de [place], MD, ABFM, [facility] Primary Care and Sports Medicine [facility]

## 2023-12-26 ENCOUNTER — Telehealth (HOSPITAL_BASED_OUTPATIENT_CLINIC_OR_DEPARTMENT_OTHER): Payer: Self-pay | Admitting: *Deleted

## 2023-12-26 NOTE — Telephone Encounter (Signed)
 Received fax frp, AS&ME patient assistance needs new Rx for Brilinta sent to [facility] Sent as requested

## 2023-12-27 LAB — LAB REPORT - SCANNED
Calcium: 9.9
EGFR: 88
TSH: 1.52 (ref 0.41–5.90)

## 2023-12-27 MED ORDER — TICAGRELOR 60 MG PO TABS: 30.0000 mg | ORAL_TABLET | Freq: Two times a day (BID) | ORAL | 3 refills | Status: DC

## 2024-01-02 ENCOUNTER — Encounter (HOSPITAL_BASED_OUTPATIENT_CLINIC_OR_DEPARTMENT_OTHER): Payer: Self-pay

## 2024-01-02 ENCOUNTER — Other Ambulatory Visit (HOSPITAL_BASED_OUTPATIENT_CLINIC_OR_DEPARTMENT_OTHER): Payer: Self-pay | Admitting: Family Medicine

## 2024-01-02 NOTE — Telephone Encounter (Signed)
Copied from CRM [id number]. Topic: Clinical - Medication Refill >> Jan 02, 2024  3:03 PM Donita Brooks wrote: Most Recent Primary Care Visit:  Provider: DE Peru, RAYMOND J  Department: [facility]  Visit Type: NEW PATIENT  Date: 12/21/2023  Medication: ***  Has the patient contacted their pharmacy?  (Agent: If no, request that the patient contact the pharmacy for the refill. If patient does not wish to contact the pharmacy document the reason why and proceed with request.) (Agent: If yes, when and what did the pharmacy advise?)  Is this the correct pharmacy for this prescription?  If no, delete pharmacy and type the correct one.  This is the patient's preferred pharmacy:  [id number] [place], [place] - [address] AT [facility] OF [address] [place] [address] Phone: [phone] Fax: [phone]  [facility] - [place], [place] - [address] [place] [address] Phone: [phone] Fax: [phone]  [facility] - [place], [place] - [address] [place] [address] Phone: [phone] Fax: [phone]   Has the prescription been filled recently?   Is the patient out of the medication?   Has the patient been seen for an appointment in the last year OR does the patient have an upcoming appointment?   Can we respond through MyChart?   Agent: Please be advised that Rx refills may take up to 3 business days. We ask that you follow-up with your pharmacy.

## 2024-01-06 ENCOUNTER — Other Ambulatory Visit: Payer: Self-pay | Admitting: Family Medicine

## 2024-01-06 DIAGNOSIS — Z1231 Encounter for screening mammogram for malignant neoplasm of breast: Secondary | ICD-10-CM

## 2024-01-12 NOTE — Assessment & Plan Note (Signed)
Blood pressure at goal in office today.  Recommend continuing with current medication regimen, no changes to be made today.  Recommend continued follow-up with cardiology as scheduled

## 2024-01-12 NOTE — Assessment & Plan Note (Signed)
Clinically euthyroid, can continue with current medication regimen.  We can plan to monitor thyroid function on upcoming labs

## 2024-01-27 ENCOUNTER — Other Ambulatory Visit: Payer: Self-pay | Admitting: Cardiology

## 2024-02-06 ENCOUNTER — Encounter (HOSPITAL_BASED_OUTPATIENT_CLINIC_OR_DEPARTMENT_OTHER): Payer: Self-pay | Admitting: Family

## 2024-02-06 ENCOUNTER — Other Ambulatory Visit (HOSPITAL_BASED_OUTPATIENT_CLINIC_OR_DEPARTMENT_OTHER): Payer: Medicare Other

## 2024-02-06 ENCOUNTER — Other Ambulatory Visit: Payer: Self-pay | Admitting: Student

## 2024-02-06 ENCOUNTER — Ambulatory Visit (INDEPENDENT_AMBULATORY_CARE_PROVIDER_SITE_OTHER): Payer: Medicare Other | Admitting: Family

## 2024-02-06 VITALS — BP 182/80 | HR 55 | Ht 65.0 in | Wt 118.8 lb

## 2024-02-06 DIAGNOSIS — G459 Transient cerebral ischemic attack, unspecified: Secondary | ICD-10-CM

## 2024-02-06 DIAGNOSIS — I25118 Atherosclerotic heart disease of native coronary artery with other forms of angina pectoris: Secondary | ICD-10-CM

## 2024-02-06 DIAGNOSIS — R42 Dizziness and giddiness: Secondary | ICD-10-CM | POA: Diagnosis not present

## 2024-02-06 DIAGNOSIS — I471 Supraventricular tachycardia, unspecified: Secondary | ICD-10-CM

## 2024-02-06 DIAGNOSIS — I6601 Occlusion and stenosis of right middle cerebral artery: Secondary | ICD-10-CM

## 2024-02-06 DIAGNOSIS — I639 Cerebral infarction, unspecified: Secondary | ICD-10-CM

## 2024-02-06 DIAGNOSIS — R0609 Other forms of dyspnea: Secondary | ICD-10-CM

## 2024-02-06 DIAGNOSIS — E782 Mixed hyperlipidemia: Secondary | ICD-10-CM

## 2024-02-06 DIAGNOSIS — Z8673 Personal history of transient ischemic attack (TIA), and cerebral infarction without residual deficits: Secondary | ICD-10-CM

## 2024-02-06 MED ORDER — NITROGLYCERIN 0.4 MG SL SUBL: 0.4000 mg | SUBLINGUAL_TABLET | SUBLINGUAL | 3 refills | Status: DC | PRN

## 2024-02-06 NOTE — Patient Instructions (Signed)
 Medication Instructions:  Your physician recommends that you continue on your current medications as directed. Please refer to the Current Medication list given to you today.   Testing/Procedures: Your physician has requested that you have an echocardiogram after 3/11. Echocardiography is a painless test that uses sound waves to create images of your heart. It provides your doctor with information about the size and shape of your heart and how well your heart's chambers and valves are working. This procedure takes approximately one hour. There are no restrictions for this procedure. Please do NOT wear cologne, perfume, aftershave, or lotions (deodorant is allowed). Please arrive 15 minutes prior to your appointment time.  Please note: We ask at that you not bring children with you during ultrasound (echo/ vascular) testing. Due to room size and safety concerns, children are not allowed in the ultrasound rooms during exams. Our front office staff cannot provide observation of children in our lobby area while testing is being conducted. An adult accompanying a patient to their appointment will only be allowed in the ultrasound room at the discretion of the ultrasound technician under special circumstances. We apologize for any inconvenience.  Your physician has recommended that you wear a Zio monitor.   This monitor is a medical device that records the heart's electrical activity. Doctors most often use these monitors to diagnose arrhythmias. Arrhythmias are problems with the speed or rhythm of the heartbeat. The monitor is a small device applied to your chest. You can wear one while you do your normal daily activities. While wearing this monitor if you have any symptoms to push the button and record what you felt. Once you have worn this monitor for the period of time provider prescribed (Usually 14 days), you will return the monitor device in the postage paid box. Once it is returned they will download  the data collected and provide [place] with a report which the provider will then review and we will call you with those results. Important tips:  Avoid showering during the first 24 hours of wearing the monitor. Avoid excessive sweating to help maximize wear time. Do not submerge the device, no hot tubs, and no swimming pools. Keep any lotions or oils away from the patch. After 24 hours you may shower with the patch on. Take brief showers with your back facing the shower head.  Do not remove patch once it has been placed because that will interrupt data and decrease adhesive wear time. Push the button when you have any symptoms and write down what you were feeling. Once you have completed wearing your monitor, remove and place into box which has postage paid and place in your outgoing mailbox.  If for some reason you have misplaced your box then call our office and we can provide another box and/or mail it off for you.   Follow-Up: At [facility], you and your health needs are our priority.  As part of our continuing mission to provide you with exceptional heart care, we have created designated Provider Care Teams.  These Care Teams include your primary Cardiologist (physician) and Advanced Practice Providers (APPs -  Physician Assistants and Nurse Practitioners) who all work together to provide you with the care you need, when you need it.  We recommend signing up for the patient portal called "MyChart".  Sign up information is provided on this After Visit Summary.  MyChart is used to connect with patients for Virtual Visits (Telemedicine).  Patients are able to view lab/test results,  encounter notes, upcoming appointments, etc.  Non-urgent messages can be sent to your provider as well.   To learn more about what you can do with MyChart, go to [email or web address].    Your next appointment:   2 months with Dr. Cristal Deer, Gillian Shields, NP, and Eligha Bridegroom, NP

## 2024-02-06 NOTE — Progress Notes (Signed)
 Cardiology Office Note:  .   Date:  02/06/2024  ID:  Shannon, [name] [birth year], MRN [id number] PCP: de Peru, Raymond J, MD  [facility] Providers Cardiologist:  Jodelle Red, MD    History of Present Illness: .   Shannon [name] is a 78 y.o. female  with a hx of hypertension, CVA, CAD.   Prior cardiac cath (date unknown) with reported 30% RCA lesion. Prior echo 2019 with normal LVEF, negative bubble study. Previous ETT 02/26/21 with fair exercise tolerance, 1mm up-sloping to horizontal ST depressions in inferior lateral leads of borderline significance.  Due to chest pain cardiac CTA performed 12/03/2021 with coronary calcium score 150 placing her in the 67th percentile for age and sex matched control.  She had noncalcified plaques in the proximal RCA with moderate 50 to 69% stenosis, calcified plaque in the proximal RCA, LAD, proximal LCx with 0 to 24% stenosis.  Noted PFO.  FFR on RCA of 0.8 suggesting borderline likelihood of hemodynamic significance.  She was started on Imdur but did not tolerate.  Previously did not tolerate increased doses of metoprolol.   At follow up 05/20/22 noted exertional dyspnea and chest pain. Cardiac cath ordered and performed with PCI/DES to prox RCA, nonobstructive disease (dist LAD, mid-dist Cx 10%). Discharged on Brilinta 90mg  BID for at least 6 months though 3 months could be considered if bleeding issues.     Seen 08/27/22 by Marjie Skiff, PA due to chest pain. Subsequent myoview 08/24/22 low risk study, no evidence of ischemia nor infarction, EF 68%.   Last seen 10/2023 noting some stressors and PCP had started Lexapro but had concerns due to episodes of heart racing, feeling weak. She was hypotensive in setting of poor PO intake   Presents today for follow up. She has been checking BP and HR at home daily. Since 01/22/24 BP range 97/56-169/77, with average SBP 145. HR averaging in the 50's. She has been having more frequent dizzy spells since  January. Her dizzy spells are occurring more when sitting. She reports having to put her head down and says this is different than her Vertigo spells she has previously had. She has been having to lay down and rest more often d/t dizziness and unable to continue with her daily activities. Denies occurring with changing positions. She does report heaviness periodically when walking and could not get her breath lasting roughly 5 min. Resolved after resting. Reports she is drinking three 16 oz bottles of water daily and eating good meals. She does have palpitations rarely.No edema, orthopnea, PND.  ROS: Please see the history of present illness.    All other systems reviewed and are negative.   Studies Reviewed: .           Risk Assessment/Calculations:     HYPERTENSION CONTROL Vitals:   02/06/24 0914 02/06/24 1029  BP: (!) 199/69 (!) 182/80    The patient's blood pressure is elevated above target today.  In order to address the patient's elevated BP: The blood pressure is usually elevated in clinic.  Blood pressures monitored at home have been optimal.          Physical Exam:   VS:  BP (!) 182/80 (BP Location: Left Arm, Cuff Size: Normal)   Pulse (!) 55   Ht 5\' 5"  (1.651 m)   Wt 118 lb 12.8 oz (53.9 kg)   SpO2 96%   BMI 19.77 kg/m    Wt Readings from Last 3 Encounters:  02/06/24  118 lb 12.8 oz (53.9 kg)  12/21/23 118 lb 1.6 oz (53.6 kg)  11/04/23 119 lb (54 kg)    Vitals:   02/06/24 0914 02/06/24 1029  BP: (!) 199/69 (!) 182/80  Pulse: (!) 55   Height: 5\' 5"  (1.651 m)   Weight: 118 lb 12.8 oz (53.9 kg)   SpO2: 96%   BMI (Calculated): 19.77      GEN: Well nourished, well developed in no acute distress, anxious NECK: No JVD; No carotid bruits CARDIAC: RRR, no murmurs, rubs, gallops RESPIRATORY:  Clear to auscultation without rales, wheezing or rhonchi  ABDOMEN: Soft, non-tender, non-distended EXTREMITIES:  No edema; No deformity   ASSESSMENT AND PLAN: .    Dizziness  - Worsening dizziness different than prior vertigo. Previously only with activity now occurring at rest. 14 day ZIO placed in clinic. Echocardiogram to assess for valvular abnormalities ordered. Encouraged to schedule overdue follow up with Dr. Corliss Skains. 7/2024CT angio head/neck with 'suggestion of possible mild worsening of the left middle cerebral artery distally' recommended for repeat imaging in 6 mos not yet performed. Reiterated need to stay well hydrated.  Coronary artery disease / HLD, LDL goal <55 - PCI/DES RCA 05/31/22. 08/2022 low risk myoview. Continue aspirin 81 mg, Atorvastatin 40mg  daily. 11/04/23 LDL 58.  Refilled nitroglycerin.    Palpitations / Tachycardia / Bradycardia / Sinus pause 1.68 sec - Previous monitor 08/2020 with paroxymal SVT. Continue Toprol 12.5mg  QD. Did not tolerate increased doses nor Diltiazem. Having increased episodes of dizziness unlike vertigo episodes in the past. Continue metoprolol 12.5 mg daily, concern discontinuing will worsen palpitations. Due to dizziness, 14 day ZIO placed in clinic to rule out increase SVT or severe bradycardia. Echo ordered.    PFO -incidental finding on CT.  Negative bubble study on prior echo.  History of stroke but was due to ischemia/stenosis. No indication for closure.    HTN / Epstein coat hypertension- BP today in office 182/80. At home average systolic 145. She is still having labile readings at home including hypotensive spells.  Discussed to monitor BP at home at least 2 hours after medications and sitting for 5-10 minutes.  Hypothyroidism - Managed by primary care.   History of CVA - On Aspirin 81 mg and Brilinta 60 mg per interventional neurology. Follows with neurology, IR.        Dispo: follow up in 2 months.   Signed, Alver Sorrow, NP

## 2024-02-20 ENCOUNTER — Ambulatory Visit
Admission: RE | Admit: 2024-02-20 | Discharge: 2024-02-20 | Disposition: A | Discharge status: Home / Self Care | Payer: Medicare Other | Source: Ambulatory Visit | Attending: Family Medicine

## 2024-02-20 DIAGNOSIS — Z1231 Encounter for screening mammogram for malignant neoplasm of breast: Secondary | ICD-10-CM

## 2024-02-26 ENCOUNTER — Encounter (HOSPITAL_BASED_OUTPATIENT_CLINIC_OR_DEPARTMENT_OTHER): Payer: Self-pay | Admitting: Family Medicine

## 2024-02-29 ENCOUNTER — Encounter: Payer: Self-pay | Admitting: Cardiology

## 2024-02-29 ENCOUNTER — Telehealth: Payer: Self-pay | Admitting: Family

## 2024-02-29 NOTE — Telephone Encounter (Signed)
 Patient was returning phone call

## 2024-02-29 NOTE — Telephone Encounter (Signed)
 Spoke with patient regarding episodes  3/3 7:45 am blood pressure 150/70 HR 54, after breakfast had chest pain for about an hour  3/5 7:54 am blood pressure 129/64 HR 58, difficulty breathing in the am   Strips reviewed by Dr Duke Salvia who recommended stopping Metoprolol and keeping follow up   Advised patient, verbalized understanding

## 2024-02-29 NOTE — Telephone Encounter (Signed)
 Calling to report abnormal monitor results. Call transferred

## 2024-02-29 NOTE — Telephone Encounter (Signed)
 This encounter was created in error - please disregard.

## 2024-02-29 NOTE — Telephone Encounter (Signed)
   Cardiac Monitor Alert  Date of alert:  02/29/2024   Patient Name: Shannon [name]  DOB: 08-01-1946  MRN: [id number]   [facility] Cardiologist: Jodelle Red, MD   [facility] EP:  None    Monitor Information: Long Term Monitor [ZioXT]  Reason:  Palpitations / Tachycardia / Bradycardia / Sinus pause 1.68 sec  Ordering provider:  Gillian Shields, NP   Alert Bradycardia - slowest HR: 37 for 30 seconds (02/13/2024 @ 0959) This is the 1st alert for this rhythm. Patient triggered: patient felt chest pain and pressure.   Bradycardia - slowest HR: 39 BPM for 30 seconds (02/15/2024 @ 0930) This is the 2nd alert for this rhythm. Patient triggered: no symptom attached.  Next Cardiology Appointment   Date:  March 29, 2024  Provider:  Eligha Bridegroom, NP  The patient could NOT be reached by telephone today.  Voicemail left for patient to call back on 02/29/2024 Arrhythmia, symptoms and history reviewed with Dr. Duke Salvia .   Plan:   Other:   Gabriel Rung, LPN  05/10/4131 44:01 AM

## 2024-03-02 ENCOUNTER — Ambulatory Visit (HOSPITAL_BASED_OUTPATIENT_CLINIC_OR_DEPARTMENT_OTHER): Payer: Medicare Other

## 2024-03-02 ENCOUNTER — Telehealth (HOSPITAL_BASED_OUTPATIENT_CLINIC_OR_DEPARTMENT_OTHER): Payer: Self-pay | Admitting: *Deleted

## 2024-03-02 DIAGNOSIS — R0609 Other forms of dyspnea: Secondary | ICD-10-CM

## 2024-03-02 DIAGNOSIS — R0989 Other specified symptoms and signs involving the circulatory and respiratory systems: Secondary | ICD-10-CM

## 2024-03-02 NOTE — Telephone Encounter (Addendum)
 Patient came in for echo today and wanted to speak with nurse about blood pressure  Toprol d/c Wednesday secondary to bradycardia, took Toprol 25 mg 1/2 tablet daily  Yesterday morning blood pressure was 120/70 HR 58, no Metoprolol  Went to bible study, at end she started having burning in eyes used soothXP eye drops. When she was getting in car had sharp pain in head and lightheaded HR 72 during the episode. Sharp pain didn't last long.  Blood pressure 190/90 HR 59 when she got home, headache continued  Aware stress of the not feeling well and having to drive home could have contributed to elevated blood pressure.  Additional stressor sister passed away this week  She did take a nap for couple of hours but had headache until she went to bed. Felt "unstable"  Did not check blood pressure anymore rest of day  Took her dog for walk yesterday and her left leg felt "heavy". Per patient this does happen at times  Left side effected by stroke 2019 Does have issues with lights and sunlight bothering her eyes   This am 132/69 hr 69 Went for walk this am and head felt "loopy/woozy" when she got back Not dizzy like yesterday Feels ok now  Blood pressure prior to stopping the Toprol was in the 150's  She is VERY concerned over what to do if blood pressure goes up over weekend.   Dr Cristal Deer out of the office   Will forward to Dr Bjorn Pippin for review

## 2024-03-03 LAB — ECHOCARDIOGRAM COMPLETE
AR max vel: 2.69 cm2
AV Area VTI: 2.4 cm2
AV Area mean vel: 2.58 cm2
AV Mean grad: 2 mmHg
AV Peak grad: 4.8 mmHg
AV Vena cont: 0.23 cm
Ao pk vel: 1.09 m/s
Area-P 1/2: 2.71 cm2
P 1/2 time: 582 ms
S' Lateral: 2.24 cm

## 2024-03-04 NOTE — Telephone Encounter (Signed)
 Recommend checking BP/heart rate twice daily for 1 week and letting us know results

## 2024-03-05 NOTE — Telephone Encounter (Signed)
 Reviewed recommendations with patient  Dizzy this morning with walk, had to stop a couple of times to catch breath  Did walk about 1 & 1/2 miles  HR higher than has been, running more in the 60's 127/75 HR 59 yesterday morning   Would like to know results of Echo   Will forward to Dr Cristal Deer for review

## 2024-03-06 ENCOUNTER — Encounter (HOSPITAL_BASED_OUTPATIENT_CLINIC_OR_DEPARTMENT_OTHER): Payer: Self-pay

## 2024-03-13 NOTE — Telephone Encounter (Signed)
 Patient viewed mychart results with Ronn Melena NP  comments on Echo

## 2024-03-14 ENCOUNTER — Encounter (HOSPITAL_BASED_OUTPATIENT_CLINIC_OR_DEPARTMENT_OTHER): Payer: Self-pay | Admitting: *Deleted

## 2024-03-14 ENCOUNTER — Ambulatory Visit (HOSPITAL_BASED_OUTPATIENT_CLINIC_OR_DEPARTMENT_OTHER): Admitting: *Deleted

## 2024-03-14 VITALS — Ht 65.0 in | Wt 118.8 lb

## 2024-03-14 DIAGNOSIS — Z0000 Encounter for general adult medical examination without abnormal findings: Secondary | ICD-10-CM | POA: Diagnosis not present

## 2024-03-14 NOTE — Telephone Encounter (Signed)
 Mychart message sent to patient.

## 2024-03-14 NOTE — Progress Notes (Signed)
 Subjective:   Shannon [name] is a 78 y.o. female who presents for Medicare Annual (Subsequent) preventive examination.  Visit Complete: Virtual I connected with  Shannon [name] on 03/14/24 by a audio enabled telemedicine application and verified that I am speaking with the correct person using two identifiers.  Patient Location: Home  Provider Location: Office/Clinic  I discussed the limitations of evaluation and management by telemedicine. The patient expressed understanding and agreed to proceed.  Vital Signs: Because this visit was a virtual/telehealth visit, some criteria may be missing or patient reported. Any vitals not documented were not able to be obtained and vitals that have been documented are patient reported.  Patient Medicare AWV questionnaire was completed by the patient on 03/14/2024; I have confirmed that all information answered by patient is correct and no changes since this date.  Cardiac Risk Factors include: advanced age (>65men, >77 women);obesity (BMI >30kg/m2);hypertension;Other (see comment);sedentary lifestyle, Risk factor comments: history of stroke     Objective:    Today's Vitals   03/14/24 1404  Weight: 118 lb 12.8 oz (53.9 kg)  Height: 5\' 5"  (1.651 m)  PainSc: 7    Body mass index is 19.77 kg/m.     03/14/2024    2:15 PM 06/17/2022   10:00 AM 05/31/2022    5:50 AM 11/17/2020    7:23 AM 10/01/2020    6:43 AM 09/23/2020    8:04 AM 11/21/2019    7:09 AM  Advanced Directives  Does Patient Have a Medical Advance Directive? Yes No No No No No No  Type of Advance Directive Living will        Does patient want to make changes to medical advance directive? No - Patient declined        Would patient like information on creating a medical advance directive?  Yes (MAU/Ambulatory/Procedural Areas - Information given) No - Patient declined No - Patient declined No - Patient declined No - Patient declined No - Patient declined    Current Medications  (verified) Outpatient Encounter Medications as of 03/14/2024  Medication Sig   acetaminophen (TYLENOL) 325 MG tablet Take 325 mg by mouth every 6 (six) hours as needed for headache.    aspirin EC 81 MG EC tablet Take 1 tablet (81 mg total) by mouth daily.   atorvastatin (LIPITOR) 40 MG tablet Take 1 tablet (40 mg total) by mouth daily at 6 PM.   Calcium Carb-Cholecalciferol (CALCIUM 600 + D PO) Take 300-600 mg by mouth See admin instructions. Take 600 mg after breakfast, 600 mg after lunch, and 300 mg after dinner   Coenzyme Q10 50 MG CAPS Take 50 mg by mouth daily.    escitalopram (LEXAPRO) 5 MG tablet Take 5 mg by mouth daily.   levothyroxine (SYNTHROID) 75 MCG tablet Take 75 mcg by mouth every Tuesday, Thursday, Saturday, and Sunday.    levothyroxine (SYNTHROID, LEVOTHROID) 50 MCG tablet Take 50 mcg by mouth every Monday, Wednesday, and Friday.    loratadine (CLARITIN) 10 MG tablet Take 10 mg by mouth daily.   Multiple Vitamin (MULTIVITAMIN) capsule Take 1 capsule by mouth daily. Senior   nitroGLYCERIN (NITROSTAT) 0.4 MG SL tablet Place 1 tablet (0.4 mg total) under the tongue every 5 (five) minutes as needed for chest pain.   Polyvinyl Alcohol-Povidone PF (REFRESH) 1.4-0.6 % SOLN Apply 1 drop to eye as needed (dry eye). Both eyes   Propylene Glycol (SYSTANE BALANCE OP) Apply 1 drop to eye as needed (dry eye). Both eyes.  ticagrelor (BRILINTA) 60 MG TABS tablet Take 0.5 tablets (30 mg total) by mouth 2 (two) times daily.   vitamin E 400 UNIT capsule Take 400 Units by mouth every other day.    No facility-administered encounter medications on file as of 03/14/2024.    Allergies (verified) Diltiazem, Elastic bandages & [zinc], Isosorbide mononitrate [isosorbide nitrate], Metoprolol, Sulfa antibiotics, Tape, Mandol [cefamandole], and Sinutab non-drowsy max [pseudoephedrine-acetaminophen]   History: Past Medical History:  Diagnosis Date   Anxiety    Artery stenosis (HCC)    cerebral    Arthritis    Cancer (HCC)    skin - squamous cell   Complication of anesthesia    " I had difficulty coming out of it"   Dyspnea    with exertion   Family history of adverse reaction to anesthesia    " My daughter has a problem coming ou to it"   GERD (gastroesophageal reflux disease)    Headache    migraines in the past   Hypertension    Hypothyroidism    Stroke (HCC) 11/2018   some weakness on right side    SVT (supraventricular tachycardia) (HCC)    Thyroid disease    Past Surgical History:  Procedure Laterality Date   ABDOMINAL HYSTERECTOMY     APPENDECTOMY     CARDIAC CATHETERIZATION     CARPAL TUNNEL RELEASE Bilateral    COLONOSCOPY     CORONARY STENT INTERVENTION N/A 05/31/2022   Procedure: CORONARY STENT INTERVENTION;  Surgeon: Corky Crafts, MD;  Location: [facility] INVASIVE CV LAB;  Service: Cardiovascular;  Laterality: N/A;   CORONARY ULTRASOUND/IVUS N/A 05/31/2022   Procedure: Intravascular Ultrasound/IVUS;  Surgeon: Corky Crafts, MD;  Location: [facility] INVASIVE CV LAB;  Service: Cardiovascular;  Laterality: N/A;   DILATION AND CURETTAGE OF UTERUS     IR ANGIO INTRA EXTRACRAN SEL COM CAROTID INNOMINATE BILAT MOD SED  12/25/2018   IR ANGIO INTRA EXTRACRAN SEL COM CAROTID INNOMINATE BILAT MOD SED  10/30/2019   IR ANGIO INTRA EXTRACRAN SEL COM CAROTID INNOMINATE BILAT MOD SED  09/23/2020   IR ANGIO VERTEBRAL SEL SUBCLAVIAN INNOMINATE UNI L MOD SED  12/25/2018   IR ANGIO VERTEBRAL SEL SUBCLAVIAN INNOMINATE UNI L MOD SED  10/30/2019   IR ANGIO VERTEBRAL SEL SUBCLAVIAN INNOMINATE UNI L MOD SED  09/23/2020   IR ANGIO VERTEBRAL SEL SUBCLAVIAN INNOMINATE UNI R MOD SED  01/04/2019   IR ANGIO VERTEBRAL SEL VERTEBRAL UNI R MOD SED  12/25/2018   IR ANGIO VERTEBRAL SEL VERTEBRAL UNI R MOD SED  10/30/2019   IR ANGIO VERTEBRAL SEL VERTEBRAL UNI R MOD SED  09/23/2020   IR CT HEAD LTD  01/04/2019   IR CT HEAD LTD  11/21/2019   IR INTRA CRAN STENT  01/04/2019   IR INTRA CRAN STENT   11/21/2019   IR RADIOLOGIST EVAL & MGMT  03/12/2021   IR RADIOLOGIST EVAL & MGMT  09/10/2021   IR RADIOLOGIST EVAL & MGMT  01/07/2023   IR RADIOLOGIST EVAL & MGMT  07/05/2023   IR TRANSCATH EXCRAN VERT OR CAR A STENT  11/17/2020   IR US GUIDE VASC ACCESS RIGHT  12/25/2018   IR US GUIDE VASC ACCESS RIGHT  11/02/2019   LEFT HEART CATH AND CORONARY ANGIOGRAPHY N/A 05/31/2022   Procedure: LEFT HEART CATH AND CORONARY ANGIOGRAPHY;  Surgeon: Corky Crafts, MD;  Location: [facility];  Service: Cardiovascular;  Laterality: N/A;   OVARIAN CYST REMOVAL     RADIOLOGY WITH  ANESTHESIA N/A 01/04/2019   Procedure: RMCA angioplasty with possible stenting;  Surgeon: Julieanne Cotton, MD;  [facility];  Service: Radiology;  Laterality: N/A;   RADIOLOGY WITH ANESTHESIA N/A 11/21/2019   Procedure: IR WITH ANESTHESIA/ STENT PLACEMENT;  Surgeon: Julieanne Cotton, MD;  Location: [facility] OR;  Service: Radiology;  Laterality: N/A;   RADIOLOGY WITH ANESTHESIA N/A 10/01/2020   Procedure: IR WITH ANESTHESIA ANGIOPLASTY WITH POSSIBLE STENTING;  Surgeon: Julieanne Cotton, MD;  Location: [facility] OR;  Service: Radiology;  Laterality: N/A;   RADIOLOGY WITH ANESTHESIA N/A 11/17/2020   Procedure: IR WITH ANESTHESIA STENT PLACEMENT;  Surgeon: Julieanne Cotton, MD;  Location: [facility] OR;  Service: Radiology;  Laterality: N/A;   Family History  Problem Relation Age of Onset   CVA Mother    Heart disease Mother    Peripheral Artery Disease Father    Breast cancer Sister    Breast cancer Sister    CVA Maternal Aunt    Social History   Socioeconomic History   Marital status: Married    Spouse name: Not on file   Number of children: Not on file   Years of education: Not on file   Highest education level: Some college, no degree  Occupational History   Occupation: retired  Tobacco Use   Smoking status: Never    Passive exposure: Never   Smokeless tobacco: Never  Vaping Use   Vaping status: Never Used  Substance  and Sexual Activity   Alcohol use: Never   Drug use: Never   Sexual activity: Yes  Other Topics Concern   Not on file  Social History Narrative   Not on file   Social Drivers of Health   Financial Resource Strain: Low Risk  (12/20/2023)   Overall Financial Resource Strain (CARDIA)    Difficulty of Paying Living Expenses: Not hard at all  Food Insecurity: No Food Insecurity (12/20/2023)   Hunger Vital Sign    Worried About Running Out of Food in the Last Year: Never true    Ran Out of Food in the Last Year: Never true  Transportation Needs: No Transportation Needs (12/20/2023)   PRAPARE - Administrator, Civil Service (Medical): No    Lack of Transportation (Non-Medical): No  Physical Activity: Sufficiently Active (12/20/2023)   Exercise Vital Sign    Days of Exercise per Week: 6 days    Minutes of Exercise per Session: 30 min  Stress: Stress Concern Present (12/20/2023)   [employer] of Occupational Health - Occupational Stress Questionnaire    Feeling of Stress : To some extent  Social Connections: Socially Integrated (12/20/2023)   Social Connection and Isolation Panel [NHANES]    Frequency of Communication with Friends and Family: More than three times a week    Frequency of Social Gatherings with Friends and Family: Patient declined    Attends Religious Services: More than 4 times per year    Active Member of [employer] or Organizations: Yes    Attends Engineer, structural: More than 4 times per year    Marital Status: Married    Tobacco Counseling Counseling given: Not Answered   Clinical Intake:  Pre-visit preparation completed: Yes  Pain : 0-10 Pain Score: 7  Pain Type: Chronic pain Pain Location: Shoulder Pain Orientation: Right, Left Pain Radiating Towards: does not radiate anywhere Pain Descriptors / Indicators: Aching Pain Onset: More than a month ago Pain Frequency: Constant Pain Relieving Factors: tries OTC pain remedies Effect of Pain on  Daily Activities: does not effect activities of daily living  Pain Relieving Factors: tries OTC pain remedies  BMI - recorded: 19.77 Nutritional Status: BMI of 19-24  Normal Nutritional Risks: None Diabetes: No  How often do you need to have someone help you when you read instructions, pamphlets, or other written materials from your doctor or pharmacy?: 1 - Never What is the last grade level you completed in school?: Some College  Interpreter Needed?: No  Information entered by :: Cristy Hilts, CMA   Activities of Daily Living    03/14/2024    2:08 PM  In your present state of health, do you have any difficulty performing the following activities:  Hearing? 0  Vision? 0  Difficulty concentrating or making decisions? 1  Comment has some trouble remembering different things due to a stroke years ago  Walking or climbing stairs? 0  Dressing or bathing? 0  Doing errands, shopping? 1  Preparing Food and eating ? N  Using the Toilet? N  In the past six months, have you accidently leaked urine? Y  Do you have problems with loss of bowel control? N  Managing your Medications? N  Managing your Finances? N  Housekeeping or managing your Housekeeping? N    Patient Care Team: [place], Buren Kos, MD as PCP - General (Family Medicine) Jodelle Red, MD as PCP - Cardiology (Cardiology) Hillis Range, MD (Inactive) as Consulting Physician (Cardiology)  Indicate any recent Medical Services you may have received from other than [facility] providers in the past year (date may be approximate).     Assessment:   This is a routine wellness examination for Malaisha.  Hearing/Vision screen No results found.   Goals Addressed             This Visit's Progress    general       Patient wants to be able to maintain her quality of life by being able to walk and take care of herself and her husband.      Depression Screen    03/14/2024    2:18 PM 12/21/2023   12:58 PM 07/28/2022     9:41 AM 06/30/2022   12:39 PM 04/29/2020   10:02 AM 01/10/2019   10:45 AM 01/03/2019    1:25 PM  PHQ 2/9 Scores  PHQ - 2 Score 0 3 1 0 0 0 0  PHQ- 9 Score  9 6 6        Fall Risk    03/14/2024    2:16 PM 12/21/2023   12:58 PM 06/17/2022    9:59 AM 02/07/2019    8:28 AM  Fall Risk   Falls in the past year? 0 1 0 0  Number falls in past yr: 0 1    Injury with Fall? 0 1    Risk for fall due to : No Fall Risks History of fall(s);Impaired mobility Medication side effect   Follow up Falls evaluation completed Falls evaluation completed;Education provided;Falls prevention discussed Education provided;Falls prevention discussed     MEDICARE RISK AT HOME: Medicare Risk at Home Any stairs in or around the home?: Yes If so, are there any without handrails?: No Home free of loose throw rugs in walkways, pet beds, electrical cords, etc?: No Adequate lighting in your home to reduce risk of falls?: Yes Life alert?: No Use of a cane, walker or w/c?: No Grab bars in the bathroom?: No Shower chair or bench in shower?: Yes Elevated toilet seat or a handicapped toilet?: No  Cognitive Function:        03/14/2024    2:18 PM  6CIT Screen  What Year? 0 points  What month? 0 points  What time? 0 points  Count back from 20 0 points  Months in reverse 0 points  Repeat phrase 2 points  Total Score 2 points    Immunizations Immunization History  Administered Date(s) Administered   Fluad Quad(high Dose 65+) 09/01/2019, 11/08/2023   Influenza, High Dose Seasonal PF 10/12/2018   PFIZER(Purple Top)SARS-COV-2 Vaccination 01/03/2020, 01/24/2020   Unspecified SARS-COV-2 Vaccination 11/28/2023    TDAP status: Up to date  Flu Vaccine status: Up to date  Pneumococcal vaccine status: Due, Education has been provided regarding the importance of this vaccine. Advised may receive this vaccine at local pharmacy or Health Dept. Aware to provide a copy of the vaccination record if obtained from local pharmacy  or Health Dept. Verbalized acceptance and understanding.  Covid-19 vaccine status: Completed vaccines  Qualifies for Shingles Vaccine? Yes   Zostavax completed No   Shingrix Completed?: No.    Education has been provided regarding the importance of this vaccine. Patient has been advised to call insurance company to determine out of pocket expense if they have not yet received this vaccine. Advised may also receive vaccine at local pharmacy or Health Dept. Verbalized acceptance and understanding.  Screening Tests Health Maintenance  Topic Date Due   Pneumonia Vaccine 18+ Years old (1 of 2 - PCV) Never done   Hepatitis C Screening  Never done   DTaP/Tdap/Td (1 - Tdap) Never done   Zoster Vaccines- Shingrix (1 of 2) Never done   DEXA SCAN  Never done   COVID-19 Vaccine (4 - 2024-25 season) 05/28/2024   INFLUENZA VACCINE  07/13/2024   Medicare Annual Wellness (AWV)  03/14/2025   HPV VACCINES  Aged Out    Health Maintenance  Health Maintenance Due  Topic Date Due   Pneumonia Vaccine 28+ Years old (1 of 2 - PCV) Never done   Hepatitis C Screening  Never done   DTaP/Tdap/Td (1 - Tdap) Never done   Zoster Vaccines- Shingrix (1 of 2) Never done   DEXA SCAN  Never done    Colorectal cancer screening: Referral to GI placed (patient did not want to have placed at this time and wanted to wait to discuss with PCP). Pt aware the office will call re: appt.  Mammogram status: Completed 02/23/2024. Repeat every year   Lung Cancer Screening: (Low Dose CT Chest recommended if Age 49-80 years, 20 pack-year currently smoking OR have quit w/in 15years.) does not qualify.    Additional Screening:  Hepatitis C Screening: does not qualify Vision Screening: Recommended annual ophthalmology exams for early detection of glaucoma and other disorders of the eye. Is the patient up to date with their annual eye exam?  Yes  Who is the provider or what is the name of the office in which the patient attends  annual eye exams? [facility] If pt is not established with a provider, would they like to be referred to a provider to establish care? No .   Dental Screening: Recommended annual dental exams for proper oral hygiene     Plan:     I have personally reviewed and noted the following in the patient's chart:   Medical and social history Use of alcohol, tobacco or illicit drugs  Current medications and supplements including opioid prescriptions. Patient is not currently taking opioid prescriptions. Functional ability and status Nutritional  status Physical activity Advanced directives List of other physicians Hospitalizations, surgeries, and ER visits in previous 12 months Vitals Screenings to include cognitive, depression, and falls Referrals and appointments  In addition, I have reviewed and discussed with patient certain preventive protocols, quality metrics, and best practice recommendations. A written personalized care plan for preventive services as well as general preventive health recommendations were provided to patient.     Adelbert Gaspard, Farley Ly, CMA   03/14/2024   After Visit Summary: (MyChart) Due to this being a telephonic visit, the after visit summary with patients personalized plan was offered to patient via MyChart

## 2024-03-14 NOTE — Telephone Encounter (Signed)
 BP most often reasonable - no consistent low readings to contribute to dizziness. Echo was also reassuring. If she gets a low BP reading <110/70, eat and drink something to help raise the BP. Cue to intermittent low heart rates, would discontinue Metoprolol if she is still taking and follow up 03/29/24 as schedule.   Would also recommend she reach out to Dr. Julieanne Cotton to schedule overdue follow up as prior 7/2024CT angio head/neck with 'suggestion of possible mild worsening of the left middle cerebral artery distally' recommended for repeat imaging in 6 mos not yet performed.   Alver Sorrow, NP

## 2024-03-14 NOTE — Patient Instructions (Signed)
 Ms. Denison , Thank you for taking time to come for your Medicare Wellness Visit. I appreciate your ongoing commitment to your health goals. Please review the following plan we discussed and let me know if I can assist you in the future.   Referrals/Orders/Follow-Ups/Clinician Recommendations: No orders were placed during today's visit. Recommend checking to see if you need to get an updated pneumonia vaccine and also recommend you getting the shingles vaccine as even though you have had shingles in the past, you can get it again. Also recommend you getting a bone density scan if you are not up-to-date with that.  This is a list of the screening recommended for you and due dates:  Health Maintenance  Topic Date Due   Pneumonia Vaccine (1 of 2 - PCV) Never done   Hepatitis C Screening  Never done   DTaP/Tdap/Td vaccine (1 - Tdap) Never done   Zoster (Shingles) Vaccine (1 of 2) Never done   DEXA scan (bone density measurement)  Never done   COVID-19 Vaccine (4 - 2024-25 season) 05/28/2024   Flu Shot  07/13/2024   Medicare Annual Wellness Visit  03/14/2025   HPV Vaccine  Aged Out    Advanced directives: (Copy Requested) Please bring a copy of your health care power of attorney and living will to the office to be added to your chart at your convenience. You can mail to [facility] [address] [place], [place] [address] or email to [email or web address]  Next Medicare Annual Wellness Visit scheduled for next year: No

## 2024-03-19 NOTE — Telephone Encounter (Signed)
 Please review and advise.

## 2024-03-23 ENCOUNTER — Ambulatory Visit (HOSPITAL_COMMUNITY)
Admission: RE | Admit: 2024-03-23 | Discharge: 2024-03-23 | Disposition: A | Discharge status: Home / Self Care | Source: Ambulatory Visit | Attending: Student | Admitting: Student

## 2024-03-23 DIAGNOSIS — G459 Transient cerebral ischemic attack, unspecified: Secondary | ICD-10-CM | POA: Insufficient documentation

## 2024-03-23 DIAGNOSIS — I639 Cerebral infarction, unspecified: Secondary | ICD-10-CM | POA: Insufficient documentation

## 2024-03-23 DIAGNOSIS — I6601 Occlusion and stenosis of right middle cerebral artery: Secondary | ICD-10-CM | POA: Insufficient documentation

## 2024-03-23 MED ORDER — IOHEXOL 350 MG/ML SOLN
75.0000 mL | Freq: Once | INTRAVENOUS | Status: AC | PRN
  Administered 2024-03-23: 75 mL via INTRAVENOUS

## 2024-03-28 ENCOUNTER — Other Ambulatory Visit (HOSPITAL_COMMUNITY): Payer: Self-pay | Admitting: Interventional Radiology

## 2024-03-28 ENCOUNTER — Telehealth (HOSPITAL_COMMUNITY): Payer: Self-pay

## 2024-03-28 DIAGNOSIS — I771 Stricture of artery: Secondary | ICD-10-CM

## 2024-03-28 NOTE — Progress Notes (Signed)
 Cardiology Office Note:  .   Date:  03/29/2024  ID:  Eather, Chaires Sep 18, 1946, MRN [id number] PCP: de Peru, Alonza Jansky, MD  [facility] Providers Cardiologist:  Sheryle Donning, MD    Patient Profile: .      PMH CVA Coronary artery disease Prior cath (date unknown) with 30% RCA lesion CT calcium score 150 (67th percentile) Noncalcified plaque in prox RCA with 50-69% stenosis Calcified plaque in prox RCA, LAD, prox LCx with 0-24% stenosis  Borderline significant by CT FFR PFO LHC 05/31/2022 Prox RCA 75 Successful PCI/Synergy XD DES 3.0 x 16  Dist LAD 10 Mid Cx to distal Cx 10 Hypertension Hyperlipidemia  Prior cardiac cath with reported 30% RCA lesion, date unknown.  Echocardiogram 2019 with normal LVEF, negative bubble study.  ETT 02/26/2021 with fair exercise tolerance, 1 mm upsloping to horizontal ST depressions in inferior lateral leads of borderline significance.  Due to chest pain, cardiac CTA performed 12/03/2021 with coronary calcium score 150 (67th percentile).  She had noncalcified plaques in proximal RCA with moderate stenosis, calcified plaque in the proximal RCA, LAD, proximal LCx.  Noted PFO FFR on RCA 0.8 suggesting borderline likelihood of hemodynamic significance.  She was started on Imdur but did not tolerate.  She previously did not tolerate increased doses of metoprolol.  Follow-up 05/20/2022 she noted exertional dyspnea and chest pain.  Cardiac cath performed with PCI/DES to proximal RCA due to 75% stenosis.  She had nonobstructive disease in distal LAD and mid to distal circumflex.  She was discharged on Brilinta 90 mg twice daily for at least 6 months though 3 months could be considered if bleeding issues.  Seen 08/27/2022 by Sharren Decree, PA due to chest pain.  Subsequent Myoview 08/24/2022 low risk study, no evidence of ischemia or infarction, EF 68%.  Seen 10/2023 noting stressors for which PCP had started Lexapro due to episodes of heart  racing, feeling weak.  She was hypotensive in the setting of poor p.o. intake.  Last cardiology clinic visit was 02/06/2024 with Neomi Banks, NP.  Average SBP was 145.  HR averaging in the 50s.  She was having more frequent dizzy spells since January with the majority of these occurring while sitting.  This felt different than previous vertigo.  She reported heaviness periodically when walking and would feel like she could not get up breath for roughly 5 minutes.  Reported good p.o. intake and drinking 316 ounce bottles of water daily.  She is followed by Dr. Alvira Josephs post CVA for "possible worsening of  left middle cerebral artery distally."  Due to dizziness, echocardiogram and 14-day ZIO monitor ordered.  BP was elevated in the office with labile readings at home.  She was advised to monitor carefully at home at least 2 hours after medication.  ZIO monitor revealed bradycardia with slowest HR 37 bpm for 30 seconds on 02/13/2024 at 0959 (pt felt chest pressure and pain), 39 bpm for 30 seconds on 02/15/2024 at 09 30, no symptoms.  Monitor revealed average HR 60 bpm, 3 pauses, longest lasting 3.7 seconds. She was advised to stop metoprolol.  TTE completed 03/02/2024 revealed normal LVEF 65 to 70%, mild LVH, G1 DD, normal RV, mild calcification of aortic valve with no evidence of stenosis.       History of Present Illness: .    History of Present Illness DARIANNE MURALLES is a pleasant 78 y.o. female who is here today for follow-up of hypertension. Recently elevated home BP readings.  No home readings as high as initial clinic BP. Home machine correlates.  She reports that she struck her head on the corner of her nightstand a few days ago and continues to have tenderness to that area. She had a CT scan post fall, but is awaiting appointment with IR.she walks regularly for exercise about 1 mile.  She does note shortness of breath if she walks a further distance or up more of an incline, which she does on occasion  with her husband. She notes occasional chest heaviness.  One episode recently of chest pain for 1 hour while sitting and doing Bible study. She has noted pulse in the 60s and 70s bpm since stopping metoprolol.  She denies palpitations, orthopnea, PND, edema, presyncope, syncope.   Discussed the use of AI scribe software for clinical note transcription with the patient, who gave verbal consent to proceed.   ROS: See HPI       Studies Reviewed: Aaron Aas       No results found for: "LIPOA"   Risk Assessment/Calculations:     HYPERTENSION CONTROL Vitals:   03/29/24 1024 03/29/24 1628  BP: (!) 242/100 (!) 180/90    The patient's blood pressure is elevated above target today.  In order to address the patient's elevated BP: A new medication was prescribed today.          Physical Exam:   VS:  BP (!) 180/90   Pulse 79   Ht 5\' 5"  (1.651 m)   Wt 120 lb 3.2 oz (54.5 kg)   SpO2 98%   BMI 20.00 kg/m    Wt Readings from Last 3 Encounters:  03/29/24 120 lb 3.2 oz (54.5 kg)  03/14/24 118 lb 12.8 oz (53.9 kg)  02/06/24 118 lb 12.8 oz (53.9 kg)    GEN: Well nourished, well developed in no acute distress NECK: No JVD; No carotid bruits CARDIAC: RRR, no murmurs, rubs, gallops RESPIRATORY:  Clear to auscultation without rales, wheezing or rhonchi  ABDOMEN: Soft, non-tender, non-distended EXTREMITIES:  No edema; No deformity     ASSESSMENT AND PLAN: .    Assessment & Plan Coronary artery disease  History of PCI/DES to RCA 05/2022, low risk Myoview 08/2022.  She has occasional chest pain at rest but continues to exercise without exertional angina. No symptoms concerning for angina.  No indication for further ischemic evaluation at this time.  No bleeding concerns. Continue Brilinta, aspirin, atorvastatin.   Paroxysmal SVT Short runs of SVT noted on cardiac monitor 02/2024.  Beta-blocker was discontinued due to bradycardia.  No palpitations or noted episodes of tachycardia.  We will continue to  monitor clinically.  Hypertension   Significantly elevated BP in clinic today. Home systolic BP readings have been elevated, mostly in the range of 140s to 160s. Metoprolol was discontinued due to bradycardia. She was previously on amlodipine and lisinopril We discussed resuming one of these agents and she would like to restart lisinopril 5 mg daily as she feels she tolerated this medication better. Monitor BP at least two hours post-lisinopril. We will get BMET in two weeks at [facility]. Send BP readings in two weeks. Low sodium diet encouraged.   Sinus bradycardia   Cardiac monitor completed 02/29/2024 revealed predominant underlying rhythm sinus rhythm, 7 runs of SVT longest lasting 5 beats, 3 pauses longest lasting 3.7 seconds, minimum HR 33 bpm, junctional rhythm present.  She was advised to d/c metoprolol 2/2 bradycardia. HR is stable. Avoid beta-blockers like metoprolol due to bradycardia.  History  of CVA/Headache post-fall   She has a persistent headache since a fall a few days ago striking her head on side table. No LOC. Pain is tenderness on crown of her head, no abnormality noted. History of CVA. No symptoms concerning for TIA/stroke. She is awaiting CT scan results and will see IR next week.   Hyperlipidemia LDL goal < 70 Direct LDL 58 on 11/04/2023. Well controlled. Continue atorvastatin.         Disposition:6 months with Dr. Veryl Gottron or APP  Signed, Slater Duncan, NP-C

## 2024-03-28 NOTE — Telephone Encounter (Signed)
-----   Message from Darel Ebbs sent at 03/27/2024  3:45 PM EDT ----- Merlinda Starling,   Please schedule her for consult visit with Dr. Baker Bon to discuss about recent CTA head and neck result.   Thank you,  Aimee

## 2024-03-29 ENCOUNTER — Ambulatory Visit (INDEPENDENT_AMBULATORY_CARE_PROVIDER_SITE_OTHER): Payer: Medicare Other | Admitting: Nurse Practitioner

## 2024-03-29 ENCOUNTER — Encounter (HOSPITAL_BASED_OUTPATIENT_CLINIC_OR_DEPARTMENT_OTHER): Payer: Self-pay | Admitting: Nurse Practitioner

## 2024-03-29 VITALS — BP 180/90 | HR 79 | Ht 65.0 in | Wt 120.2 lb

## 2024-03-29 DIAGNOSIS — R001 Bradycardia, unspecified: Secondary | ICD-10-CM

## 2024-03-29 DIAGNOSIS — I10 Essential (primary) hypertension: Secondary | ICD-10-CM

## 2024-03-29 DIAGNOSIS — I160 Hypertensive urgency: Secondary | ICD-10-CM

## 2024-03-29 DIAGNOSIS — I471 Supraventricular tachycardia, unspecified: Secondary | ICD-10-CM | POA: Diagnosis not present

## 2024-03-29 DIAGNOSIS — E785 Hyperlipidemia, unspecified: Secondary | ICD-10-CM

## 2024-03-29 DIAGNOSIS — Z79899 Other long term (current) drug therapy: Secondary | ICD-10-CM

## 2024-03-29 DIAGNOSIS — I2510 Atherosclerotic heart disease of native coronary artery without angina pectoris: Secondary | ICD-10-CM | POA: Diagnosis not present

## 2024-03-29 DIAGNOSIS — Z8673 Personal history of transient ischemic attack (TIA), and cerebral infarction without residual deficits: Secondary | ICD-10-CM

## 2024-03-29 MED ORDER — LISINOPRIL 5 MG PO TABS: 5.0000 mg | ORAL_TABLET | Freq: Every day | ORAL | 11 refills | Status: DC

## 2024-03-29 NOTE — Patient Instructions (Signed)
 Medication Instructions:   RESTART Lisinopril one (1) tablet by mouth ( 5 mg) daily.    *If you need a refill on your cardiac medications before your next appointment, please call your pharmacy*  Lab Work:  Your physician recommends that you return for lab work in 2 weeks no fasting, any [facility]. Pt given paperwork today.    If you have labs (blood work) drawn today and your tests are completely normal, you will receive your results only by: MyChart Message (if you have MyChart) OR A paper copy in the mail If you have any lab test that is abnormal or we need to change your treatment, we will call you to review the results.  Testing/Procedures:   None ordered.  Follow-Up: At [facility], you and your health needs are our priority.  As part of our continuing mission to provide you with exceptional heart care, our providers are all part of one team.  This team includes your primary Cardiologist (physician) and Advanced Practice Providers or APPs (Physician Assistants and Nurse Practitioners) who all work together to provide you with the care you need, when you need it.  Your next appointment:   6 month(s)  Provider:   Sheryle Donning, MD, Slater Duncan, NP, or Neomi Banks, NP    We recommend signing up for the patient portal called "MyChart".  Sign up information is provided on this After Visit Summary.  MyChart is used to connect with patients for Virtual Visits (Telemedicine).  Patients are able to view lab/test results, encounter notes, upcoming appointments, etc.  Non-urgent messages can be sent to your provider as well.   To learn more about what you can do with MyChart, go to [email or web address].   Other Instructions  Your physician wants you to follow-up in: 6 months.  You will receive a reminder letter in the mail two months in advance. If you don't receive a letter, please call our office to schedule the follow-up appointment.  HOW TO TAKE YOUR  BLOOD PRESSURE  Rest 5 minutes before taking your blood pressure. Don't  smoke or drink caffeinated beverages for at least 30 minutes before. Take your blood pressure before (not after) you eat. Sit comfortably with your back supported and both feet on the floor ( don't cross your legs). Elevate your arm to heart level on a table or a desk. Use the proper sized cuff.  It should fit smoothly and snugly around your bare upper arm.  There should be  Enough room to slip a fingertip under the cuff.  The bottom edge of the cuff should be 1 inch above the crease Of the elbow. Please monitor your blood pressure once daily 2 hours after your am medication. If you blood pressure Consistently remains above 140 (systolic) top number or over 80 ( diastolic) bottom number X 3 days  Consecutively.  Please call our office at [phone] or send Mychart message.     ----Avoid cold medicines with D or DM at the end of them----

## 2024-03-30 DIAGNOSIS — R0609 Other forms of dyspnea: Secondary | ICD-10-CM

## 2024-04-05 ENCOUNTER — Ambulatory Visit (HOSPITAL_COMMUNITY)
Admission: RE | Admit: 2024-04-05 | Discharge: 2024-04-05 | Disposition: A | Discharge status: Home / Self Care | Source: Ambulatory Visit | Attending: Interventional Radiology | Admitting: Interventional Radiology

## 2024-04-05 DIAGNOSIS — I771 Stricture of artery: Secondary | ICD-10-CM

## 2024-04-06 HISTORY — PX: IR RADIOLOGIST EVAL & MGMT: IMG5224

## 2024-04-12 ENCOUNTER — Telehealth (HOSPITAL_BASED_OUTPATIENT_CLINIC_OR_DEPARTMENT_OTHER): Payer: Self-pay | Admitting: Nurse Practitioner

## 2024-04-12 ENCOUNTER — Other Ambulatory Visit (HOSPITAL_COMMUNITY): Payer: Self-pay | Admitting: Interventional Radiology

## 2024-04-12 ENCOUNTER — Other Ambulatory Visit (HOSPITAL_BASED_OUTPATIENT_CLINIC_OR_DEPARTMENT_OTHER): Payer: Self-pay | Admitting: *Deleted

## 2024-04-12 DIAGNOSIS — I771 Stricture of artery: Secondary | ICD-10-CM

## 2024-04-12 DIAGNOSIS — R519 Headache, unspecified: Secondary | ICD-10-CM

## 2024-04-12 DIAGNOSIS — R4781 Slurred speech: Secondary | ICD-10-CM

## 2024-04-12 MED ORDER — LISINOPRIL 5 MG PO TABS
5.0000 mg | ORAL_TABLET | Freq: Every day | ORAL | 3 refills | Status: AC
Start: 2024-04-12 — End: ?

## 2024-04-12 NOTE — Telephone Encounter (Signed)
 BP readings are well controlled. I do not recommend any medication changes. She is due for BMET since we restarted lisinopril  on 03/29/24.

## 2024-04-12 NOTE — Telephone Encounter (Signed)
 BP log placed into provider's pod for review.

## 2024-04-12 NOTE — Telephone Encounter (Signed)
 S/w pt is aware of BP reading recommendations. Pt did get labs today. Pt wanted lisinopril  filled for # 90 to requested pharmacy.  Ordered medication today.

## 2024-04-12 NOTE — Telephone Encounter (Signed)
 Pt dropped off blood pressure readings. Copy has been made and put into copy folder. Original is in providers box.

## 2024-04-13 ENCOUNTER — Encounter (HOSPITAL_BASED_OUTPATIENT_CLINIC_OR_DEPARTMENT_OTHER): Payer: Self-pay

## 2024-04-13 LAB — BASIC METABOLIC PANEL WITH GFR
BUN/Creatinine Ratio: 15 (ref 12–28)
BUN: 12 mg/dL (ref 8–27)
CO2: 26 mmol/L (ref 20–29)
Calcium: 9.8 mg/dL (ref 8.7–10.3)
Chloride: 101 mmol/L (ref 96–106)
Creatinine, Ser: 0.8 mg/dL (ref 0.57–1.00)
Glucose: 134 mg/dL — ABNORMAL HIGH (ref 70–99)
Potassium: 4.3 mmol/L (ref 3.5–5.2)
Sodium: 140 mmol/L (ref 134–144)
eGFR: 75 mL/min/{1.73_m2} (ref >59)

## 2024-04-19 ENCOUNTER — Ambulatory Visit (HOSPITAL_COMMUNITY)
Admission: RE | Admit: 2024-04-19 | Discharge: 2024-04-19 | Disposition: A | Discharge status: Home / Self Care | Source: Ambulatory Visit | Attending: Interventional Radiology | Admitting: Interventional Radiology

## 2024-04-19 DIAGNOSIS — R519 Headache, unspecified: Secondary | ICD-10-CM | POA: Diagnosis present

## 2024-04-19 DIAGNOSIS — I771 Stricture of artery: Secondary | ICD-10-CM | POA: Insufficient documentation

## 2024-04-19 DIAGNOSIS — R4781 Slurred speech: Secondary | ICD-10-CM | POA: Insufficient documentation

## 2024-04-19 MED ORDER — GADOBUTROL 1 MMOL/ML IV SOLN
5.5000 mL | Freq: Once | INTRAVENOUS | Status: AC | PRN
  Administered 2024-04-19: 5.5 mL via INTRAVENOUS

## 2024-04-30 ENCOUNTER — Telehealth (HOSPITAL_COMMUNITY): Payer: Self-pay

## 2024-04-30 NOTE — Telephone Encounter (Signed)
-----   Message from Marilu Shown sent at 04/30/2024  3:52 PM EDT ----- Regarding: MRI follow up Hi!  Dr. Alvira Josephs has reviewed this patient's MRI and notes no new changes from previous imaging, specifically no new strokes or areas of significant stenosis.   He would like her to have an MRI/MRA in 6 months.  Thanks, Cathleen Coach

## 2024-05-02 ENCOUNTER — Ambulatory Visit (HOSPITAL_BASED_OUTPATIENT_CLINIC_OR_DEPARTMENT_OTHER): Payer: Self-pay | Admitting: Family

## 2024-05-17 ENCOUNTER — Encounter (HOSPITAL_BASED_OUTPATIENT_CLINIC_OR_DEPARTMENT_OTHER): Payer: Self-pay

## 2024-05-17 NOTE — Telephone Encounter (Signed)
 Recommend continue to monitor BP at home. Recommend 7 day monitor (ZIO-XT)  Clearnce Curia, NP

## 2024-05-24 ENCOUNTER — Ambulatory Visit (HOSPITAL_BASED_OUTPATIENT_CLINIC_OR_DEPARTMENT_OTHER)

## 2024-05-24 ENCOUNTER — Other Ambulatory Visit (HOSPITAL_BASED_OUTPATIENT_CLINIC_OR_DEPARTMENT_OTHER)

## 2024-05-24 DIAGNOSIS — R001 Bradycardia, unspecified: Secondary | ICD-10-CM

## 2024-05-24 DIAGNOSIS — I471 Supraventricular tachycardia, unspecified: Secondary | ICD-10-CM

## 2024-05-24 NOTE — Progress Notes (Signed)
**Note Shannon-Identified via Obfuscation**    Nurse Visit   Date of Encounter: 05/24/2024 ID: Shannon, [name] 15-Jul-1946, MRN [id number]  PCP:  Shannon Peru, Alonza Jansky, MD   [facility] Providers Cardiologist:  Shannon Donning, MD      Visit Details   VS:  There were no vitals taken for this visit. , BMI There is no height or weight on file to calculate BMI.  Wt Readings from Last 3 Encounters:  03/29/24 120 lb 3.2 oz (54.5 kg)  03/14/24 118 lb 12.8 oz (53.9 kg)  02/06/24 118 lb 12.8 oz (53.9 kg)     Reason for visit: ZIO placement  Performed today: Education Changes (medications, testing, etc.) : No changes  Length of Visit: 5 minutes    Medications Adjustments/Labs and Tests Ordered: Orders Placed This Encounter  Procedures   LONG TERM MONITOR (3-14 DAYS)   No orders of the defined types were placed in this encounter.    Signed, Shannon Legacy, RN  05/24/2024 1:20 PM

## 2024-05-24 NOTE — Patient Instructions (Addendum)
 Medication Instructions:  Your physician recommends that you continue on your current medications as directed. Please refer to the Current Medication list given to you today.  Testing/Procedures: Your physician has recommended that you wear a Zio monitor.   This monitor is a medical device that records the heart's electrical activity. Doctors most often use these monitors to diagnose arrhythmias. Arrhythmias are problems with the speed or rhythm of the heartbeat. The monitor is a small device applied to your chest. You can wear one while you do your normal daily activities. While wearing this monitor if you have any symptoms to push the button and record what you felt. Once you have worn this monitor for the period of time provider prescribed (Usually 14 days), you will return the monitor device in the postage paid box. Once it is returned they will download the data collected and provide us  with a report which the provider will then review and we will call you with those results. Important tips:  Avoid showering during the first 24 hours of wearing the monitor. Avoid excessive sweating to help maximize wear time. Do not submerge the device, no hot tubs, and no swimming pools. Keep any lotions or oils away from the patch. After 24 hours you may shower with the patch on. Take brief showers with your back facing the shower head.  Do not remove patch once it has been placed because that will interrupt data and decrease adhesive wear time. Push the button when you have any symptoms and write down what you were feeling. Once you have completed wearing your monitor, remove and place into box which has postage paid and place in your outgoing mailbox.  If for some reason you have misplaced your box then call our office and we can provide another box and/or mail it off for you.  Follow-Up: At [facility], you and your health needs are our priority.  As part of our continuing mission to provide  you with exceptional heart care, our providers are all part of one team.  This team includes your primary Cardiologist (physician) and Advanced Practice Providers or APPs (Physician Assistants and Nurse Practitioners) who all work together to provide you with the care you need, when you need it.  Your next appointment:   October 2025 or sooner based on Monitor Results

## 2024-06-09 ENCOUNTER — Encounter (HOSPITAL_COMMUNITY): Payer: Self-pay | Admitting: Interventional Radiology

## 2024-06-12 ENCOUNTER — Other Ambulatory Visit (HOSPITAL_BASED_OUTPATIENT_CLINIC_OR_DEPARTMENT_OTHER): Payer: Self-pay | Admitting: *Deleted

## 2024-06-12 ENCOUNTER — Other Ambulatory Visit (HOSPITAL_BASED_OUTPATIENT_CLINIC_OR_DEPARTMENT_OTHER): Payer: Medicare Other

## 2024-06-12 DIAGNOSIS — Z0000 Encounter for general adult medical examination without abnormal findings: Secondary | ICD-10-CM

## 2024-06-12 DIAGNOSIS — I10 Essential (primary) hypertension: Secondary | ICD-10-CM

## 2024-06-13 ENCOUNTER — Ambulatory Visit (HOSPITAL_BASED_OUTPATIENT_CLINIC_OR_DEPARTMENT_OTHER): Payer: Self-pay | Admitting: Family Medicine

## 2024-06-13 LAB — CBC WITH DIFFERENTIAL/PLATELET
Basophils Absolute: 0 10*3/uL (ref 0.0–0.2)
Basos: 1 %
EOS (ABSOLUTE): 0.1 10*3/uL (ref 0.0–0.4)
Eos: 2 %
Hematocrit: 41.3 % (ref 34.0–46.6)
Hemoglobin: 13.7 g/dL (ref 11.1–15.9)
Immature Grans (Abs): 0 10*3/uL (ref 0.0–0.1)
Immature Granulocytes: 0 %
Lymphocytes Absolute: 1.3 10*3/uL (ref 0.7–3.1)
Lymphs: 40 %
MCH: 32.1 pg (ref 26.6–33.0)
MCHC: 33.2 g/dL (ref 31.5–35.7)
MCV: 97 fL (ref 79–97)
Monocytes Absolute: 0.3 10*3/uL (ref 0.1–0.9)
Monocytes: 8 %
Neutrophils Absolute: 1.6 10*3/uL (ref 1.4–7.0)
Neutrophils: 49 %
Platelets: 165 10*3/uL (ref 150–450)
RBC: 4.27 x10E6/uL (ref 3.77–5.28)
RDW: 12 % (ref 11.7–15.4)
WBC: 3.3 10*3/uL — ABNORMAL LOW (ref 3.4–10.8)

## 2024-06-13 LAB — COMPREHENSIVE METABOLIC PANEL WITH GFR
ALT: 25 IU/L (ref 0–32)
AST: 22 IU/L (ref 0–40)
Albumin: 4.4 g/dL (ref 3.8–4.8)
Alkaline Phosphatase: 93 IU/L (ref 44–121)
BUN/Creatinine Ratio: 19 (ref 12–28)
BUN: 13 mg/dL (ref 8–27)
Bilirubin Total: 0.5 mg/dL (ref 0.0–1.2)
CO2: 24 mmol/L (ref 20–29)
Calcium: 9.6 mg/dL (ref 8.7–10.3)
Chloride: 100 mmol/L (ref 96–106)
Creatinine, Ser: 0.67 mg/dL (ref 0.57–1.00)
Globulin, Total: 2.4 g/dL (ref 1.5–4.5)
Glucose: 88 mg/dL (ref 70–99)
Potassium: 4.5 mmol/L (ref 3.5–5.2)
Sodium: 138 mmol/L (ref 134–144)
Total Protein: 6.8 g/dL (ref 6.0–8.5)
eGFR: 89 mL/min/{1.73_m2} (ref >59)

## 2024-06-13 LAB — LIPID PANEL
Chol/HDL Ratio: 3 ratio (ref 0.0–4.4)
Cholesterol, Total: 115 mg/dL (ref 100–199)
HDL: 38 mg/dL — ABNORMAL LOW (ref >39)
LDL Chol Calc (NIH): 55 mg/dL (ref 0–99)
Triglycerides: 119 mg/dL (ref 0–149)
VLDL Cholesterol Cal: 22 mg/dL (ref 5–40)

## 2024-06-13 LAB — TSH: TSH: 1.63 u[IU]/mL (ref 0.450–4.500)

## 2024-06-17 ENCOUNTER — Encounter: Payer: Self-pay | Admitting: Emergency Medicine

## 2024-06-17 ENCOUNTER — Emergency Department

## 2024-06-17 ENCOUNTER — Emergency Department
Admission: EM | Admit: 2024-06-17 | Discharge: 2024-06-17 | Disposition: A | Discharge status: Home / Self Care | Attending: Emergency Medicine | Admitting: Emergency Medicine

## 2024-06-17 ENCOUNTER — Other Ambulatory Visit: Payer: Self-pay

## 2024-06-17 DIAGNOSIS — R0789 Other chest pain: Secondary | ICD-10-CM | POA: Diagnosis not present

## 2024-06-17 DIAGNOSIS — R0602 Shortness of breath: Secondary | ICD-10-CM | POA: Diagnosis present

## 2024-06-17 DIAGNOSIS — R079 Chest pain, unspecified: Secondary | ICD-10-CM

## 2024-06-17 LAB — BASIC METABOLIC PANEL WITH GFR
Anion gap: 11 (ref 5–15)
BUN: 16 mg/dL (ref 8–23)
CO2: 23 mmol/L (ref 22–32)
Calcium: 9.7 mg/dL (ref 8.9–10.3)
Chloride: 101 mmol/L (ref 98–111)
Creatinine, Ser: 0.71 mg/dL (ref 0.44–1.00)
GFR, Estimated: >60 mL/min (ref >60)
Glucose, Bld: 101 mg/dL — ABNORMAL HIGH (ref 70–99)
Potassium: 4 mmol/L (ref 3.5–5.1)
Sodium: 135 mmol/L (ref 135–145)

## 2024-06-17 LAB — CBC
HCT: 41.4 % (ref 36.0–46.0)
Hemoglobin: 14.4 g/dL (ref 12.0–15.0)
MCH: 31.9 pg (ref 26.0–34.0)
MCHC: 34.8 g/dL (ref 30.0–36.0)
MCV: 91.8 fL (ref 80.0–100.0)
Platelets: 188 K/uL (ref 150–400)
RBC: 4.51 MIL/uL (ref 3.87–5.11)
RDW: 11.4 % — ABNORMAL LOW (ref 11.5–15.5)
WBC: 4 K/uL (ref 4.0–10.5)
nRBC: 0 % (ref 0.0–0.2)

## 2024-06-17 LAB — TROPONIN I (HIGH SENSITIVITY)
Troponin I (High Sensitivity): 6 ng/L (ref <18)
Troponin I (High Sensitivity): 7 ng/L (ref <18)

## 2024-06-17 LAB — D-DIMER, QUANTITATIVE: D-Dimer, Quant: 1.17 ug{FEU}/mL — ABNORMAL HIGH (ref 0.00–0.50)

## 2024-06-17 MED ORDER — IOHEXOL 350 MG/ML SOLN
75.0000 mL | Freq: Once | INTRAVENOUS | Status: AC | PRN
  Administered 2024-06-17: 75 mL via INTRAVENOUS

## 2024-06-17 NOTE — ED Triage Notes (Signed)
 Patient to ED via [facility] from [place] for SOB/dizziness. Dyspnea with exertion. Recently taken off metoprolol  due to low BP. Having numbness in bilateral feet and legs. Hx stroke- 2019, left side weakness from that. Hx of stents in brain and heart.  Pain under left breast for a couple days.

## 2024-06-17 NOTE — ED Notes (Signed)
 Floy, MD aware of BP. No new orders at this time.

## 2024-06-17 NOTE — ED Provider Notes (Signed)
 [facility] Provider Note    Event Date/Time   First MD Initiated Contact with Patient 06/17/24 1103     (approximate)   History   Shortness of Breath   HPI  Shannon [name] is a 78 y.o. female who presents to the emergency department today because of concern she has noticed increasing shortness of breath with exertion over the past few days.  This morning she was trying to walk to church when she became short of breath.  Had to sit down in the church however symptoms continued.  With his shortness of breath comes lightheadedness.  She also has had intermittent lower chest pain.  She denies any fevers.     Physical Exam   Triage Vital Signs: ED Triage Vitals  Encounter Vitals Group     BP 06/17/24 1103 (!) 222/87     Girls Systolic BP Percentile --      Girls Diastolic BP Percentile --      Boys Systolic BP Percentile --      Boys Diastolic BP Percentile --      Pulse Rate 06/17/24 1103 71     Resp 06/17/24 1103 20     Temp 06/17/24 1103 97.8 F (36.6 C)     Temp Source 06/17/24 1103 Oral     SpO2 06/17/24 1103 100 %     Weight 06/17/24 1101 120 lb (54.4 kg)     Height 06/17/24 1101 5' 5 (1.651 m)     Head Circumference --      Peak Flow --      Pain Score 06/17/24 1100 3     Pain Loc --      Pain Education --      Exclude from Growth Chart --     Most recent vital signs: Vitals:   06/17/24 1103  BP: (!) 222/87  Pulse: 71  Resp: 20  Temp: 97.8 F (36.6 C)  SpO2: 100%   General: Awake, alert, oriented. CV:  Good peripheral perfusion. Regular rate and rhythm.  Resp:  Normal effort. Lungs clear. Abd:  No distention. Non tender.  ED Results / Procedures / Treatments   Labs (all labs ordered are listed, but only abnormal results are displayed) Labs Reviewed  BASIC METABOLIC PANEL WITH GFR - Abnormal; Notable for the following components:      Result Value   Glucose, Bld 101 (*)    All other components within normal limits  CBC -  Abnormal; Notable for the following components:   RDW 11.4 (*)    All other components within normal limits  D-DIMER, QUANTITATIVE - Abnormal; Notable for the following components:   D-Dimer, Quant 1.17 (*)    All other components within normal limits  TROPONIN I (HIGH SENSITIVITY)  TROPONIN I (HIGH SENSITIVITY)     EKG  I, Guadalupe Eagles, attending physician, personally viewed and interpreted this EKG  EKG Time: 1104 Rate: 71 Rhythm: sinus rhythm Axis: normal Intervals: qtc 484 QRS: narrow ST changes: no st elevation Impression: normal ekg    RADIOLOGY I independently interpreted and visualized the CXR. My interpretation: No pneumonia Radiology interpretation:  IMPRESSION:  No acute cardiopulmonary process.    I independently interpreted and visualized the CTAPE. My interpretation: No PE Radiology interpretation: IMPRESSION:  1. No evidence of pulmonary embolism or other acute process.  2. Coronary artery calcifications.  3. Aortic atherosclerosis.     PROCEDURES:  Critical Care performed: No    MEDICATIONS ORDERED IN  ED: Medications - No data to display   IMPRESSION / MDM / ASSESSMENT AND PLAN / ED COURSE  I reviewed the triage vital signs and the nursing notes.                              Differential diagnosis includes, but is not limited to, pneumonia, CHF, PE, acs, anemia  Patient's presentation is most consistent with acute presentation with potential threat to life or bodily function.   The patient is on the cardiac monitor to evaluate for evidence of arrhythmia and/or significant heart rate changes.  Patient presented emergency department today because of concerns for shortness of breath and intermittent chest pain.  On exam patient in no acute respiratory distress.  Lungs were clear.  Workup was initiated.  Troponin was negative x 2.  EKG without concerning ST elevation.  Did check a D-dimer which was elevated.  Because of this CT angio was  performed.  This did not show PE or any pneumonia.  I did have a discussion with the patient.  Given patient's history of PCI did discuss with patient she would be high risk for possible cardiac etiology.  Did offer admission however also stated that given negative workup.  Would potentially be reasonable to discharge home with close follow-up as an outpatient.  This time patient opted for discharge with close cardiology follow-up.  Did discuss return precautions with the patient.      FINAL CLINICAL IMPRESSION(S) / ED DIAGNOSES   Final diagnoses:  SOB (shortness of breath)  Chest pain, unspecified type      Note:  This document was prepared using Dragon voice recognition software and may include unintentional dictation errors.    Floy Roberts, MD 06/17/24 [phone]

## 2024-06-18 ENCOUNTER — Encounter (HOSPITAL_BASED_OUTPATIENT_CLINIC_OR_DEPARTMENT_OTHER): Payer: Self-pay

## 2024-06-19 ENCOUNTER — Encounter (HOSPITAL_BASED_OUTPATIENT_CLINIC_OR_DEPARTMENT_OTHER): Payer: Self-pay | Admitting: Family Medicine

## 2024-06-19 ENCOUNTER — Encounter (HOSPITAL_BASED_OUTPATIENT_CLINIC_OR_DEPARTMENT_OTHER): Payer: Self-pay | Admitting: Family

## 2024-06-19 ENCOUNTER — Ambulatory Visit (INDEPENDENT_AMBULATORY_CARE_PROVIDER_SITE_OTHER): Admitting: Family

## 2024-06-19 ENCOUNTER — Ambulatory Visit (HOSPITAL_BASED_OUTPATIENT_CLINIC_OR_DEPARTMENT_OTHER): Payer: Medicare Other | Admitting: Family Medicine

## 2024-06-19 VITALS — BP 135/63 | HR 75 | Ht 65.0 in | Wt 118.7 lb

## 2024-06-19 VITALS — BP 160/90 | HR 74 | Resp 16 | Ht 65.0 in | Wt 118.2 lb

## 2024-06-19 DIAGNOSIS — Z780 Asymptomatic menopausal state: Secondary | ICD-10-CM

## 2024-06-19 DIAGNOSIS — I10 Essential (primary) hypertension: Secondary | ICD-10-CM

## 2024-06-19 DIAGNOSIS — M8580 Other specified disorders of bone density and structure, unspecified site: Secondary | ICD-10-CM

## 2024-06-19 DIAGNOSIS — Z13820 Encounter for screening for osteoporosis: Secondary | ICD-10-CM

## 2024-06-19 DIAGNOSIS — I471 Supraventricular tachycardia, unspecified: Secondary | ICD-10-CM

## 2024-06-19 DIAGNOSIS — Z0000 Encounter for general adult medical examination without abnormal findings: Secondary | ICD-10-CM

## 2024-06-19 DIAGNOSIS — I2510 Atherosclerotic heart disease of native coronary artery without angina pectoris: Secondary | ICD-10-CM | POA: Diagnosis not present

## 2024-06-19 DIAGNOSIS — Z8673 Personal history of transient ischemic attack (TIA), and cerebral infarction without residual deficits: Secondary | ICD-10-CM

## 2024-06-19 DIAGNOSIS — E785 Hyperlipidemia, unspecified: Secondary | ICD-10-CM

## 2024-06-19 DIAGNOSIS — G450 Vertebro-basilar artery syndrome: Secondary | ICD-10-CM | POA: Diagnosis not present

## 2024-06-19 NOTE — Assessment & Plan Note (Signed)
 Routine HCM labs ordered. HCM reviewed/discussed. Anticipatory guidance regarding healthy weight, lifestyle and choices given. Recommend healthy diet.  Recommend approximately 150 minutes/week of moderate intensity exercise Recommend regular dental and vision exams Always use seatbelt/lap and shoulder restraints Recommend using smoke alarms and checking batteries at least twice a year Recommend using sunscreen when outside Discussed colon cancer screening recommendations, options.  Patient will consider and let us  know how he would like to proceed Discussed recommendations for shingles vaccine.  Patient will consider Discussed immunization recommendations

## 2024-06-19 NOTE — Patient Instructions (Addendum)
 Medication Instructions:  Continue your current medications *If you need a refill on your cardiac medications before your next appointment, please call your pharmacy*  Testing/Procedures: You are scheduled for a Cardiac Catheterization on Wednesday, July 16 with Dr. [name].  1. Please arrive at the [facility] (Main Entrance A) at [facility]: [address] [place], [place] [address] at 8:00 AM (This time is 2 hour(s) before your procedure to ensure your preparation).   Free valet parking service is available. You will check in at ADMITTING. The support person will be asked to wait in the waiting room.  It is OK to have someone drop you off and come back when you are ready to be discharged.    Special note: Every effort is made to have your procedure done on time. Please understand that emergencies sometimes delay scheduled procedures.  2. Diet: Do not eat solid foods after midnight.  The patient may have clear liquids until 5am upon the day of the procedure.  3. Labs: done 7/6 in ED   4. Medication instructions in preparation for your procedure:  On the morning of your procedure, take your Brilinta /Ticagrelor  and any morning medicines NOT listed above.  You may use sips of water.  5. Plan to go home the same day, you will only stay overnight if medically necessary. 6. Bring a current list of your medications and current insurance cards. 7. You MUST have a responsible person to drive you home. 8. Someone MUST be with you the first 24 hours after you arrive home or your discharge will be delayed. 9. Please wear clothes that are easy to get on and off and wear slip-on shoes.  Thank you for allowing us  to care for you!   -- [place] [facility]   Follow-Up: Your next appointment:   2-3 weeks post heart cath   Provider:   Shelda Lonni, MD, Rosaline Bane, NP, or Reche Finder, NP

## 2024-06-19 NOTE — H&P (View-Only) (Signed)
 Cardiology Office Note:  .   Date:  06/24/2024  ID:  Shannon [name], Shannon [name], Shannon [name], Shannon [name] PCP: de Peru, Raymond J, MD  [facility] Providers Cardiologist:  Shelda Bruckner, MD    History of Present Illness: .   Shannon [name] is a 78 y.o. female  with a hx of hypertension, CVA, CAD.   Prior cardiac cath (date unknown) with reported 30% RCA lesion. Prior echo 2019 with normal LVEF, negative bubble study. Previous ETT 02/26/21 with fair exercise tolerance, 1mm up-sloping to horizontal ST depressions in inferior lateral leads of borderline significance.  Due to chest pain cardiac CTA performed 12/03/2021 with coronary calcium  score 150 placing her in the 67th percentile for age and sex matched control.  She had noncalcified plaques in the proximal RCA with moderate 50 to 69% stenosis, calcified plaque in the proximal RCA, LAD, proximal LCx with 0 to 24% stenosis.  Noted PFO.  FFR on RCA of 0.8 suggesting borderline likelihood of hemodynamic significance.  She was started on Imdur  but did not tolerate.  Previously did not tolerate increased doses of metoprolol .   At follow up 05/20/22 noted exertional dyspnea and chest pain. Cardiac cath ordered and performed with PCI/DES to prox RCA, nonobstructive disease (dist LAD, mid-dist Cx 10%). Discharged on Brilinta  90mg  BID for at least 6 months though 3 months could be considered if bleeding issues.     Myoview  08/24/22 low risk study, no evidence of ischemia nor infarction, EF 68%. Echo 03/06/24 LVEF 65-70%, gr1dd, mild LVH, no significant valvular abnormalities.   04/05/24 CTA head and neck with stable right sided stented middle cerebral artery. Left middle cerebral artery M1 segment with decreased caliber proximally as well as decreased caliber of one of the bifurcation branches of left middle cerebral artery. MRI brain ordered, CTA heead and neck recommended for 6 mos. MR 04/30/24 no acut4e intracranial finding, unchanged chronic infarcts,  several nonspecific chronic microhemorrhages stable from previous.   Due to persistent dizziness, repeat ZIO monitor while off beta blocker ordered. Monitor processing data as of 06/11/24, not yet available for review.   ED visit 06/17/24 with exertional dyspnea as well as intermittent lower chest pain and lightheadedness. Troponin negative x2. EKG nonacute. D-Dimer elevated with CTA without PE nor pneumonia.   History of Present Illness Shannon [name] is a 78 year old female with coronary artery disease who presents with worsening shortness of breath and dizziness.  Since Friday, she experiences worsening shortness of breath and dizziness, initially occurring during a walk with heaviness and difficulty breathing, progressing to dizziness. She required frequent rest, extending her usual 30-minute walk to over an hour. Symptoms persisted over the weekend, with significant difficulty breathing and dizziness at church, necessitating assistance and medical attention.  On Sunday, her heart rate was 54, and her blood pressure was elevated when checked by nurses at church. She was transported to [facility] for further evaluation. A chest x-ray and CT scan showed no acute issues. She has a history of stent placement and long-standing breathing problems.  She monitors her blood pressure at home, noting fluctuations. Recently, she adjusted her lisinopril  intake to after her morning walk due to low readings post-exercise. Recent elevated readings include 150/90 in the early morning.  Shortness of breath occurs with exertion, such as walking or housework, accompanied by chest pressure and a sensation of food being stuck.   ROS: Please see the history of present illness.    All other systems reviewed and  are negative.   Studies Reviewed: SABRA   EKG Interpretation Date/Time:  Tuesday June 19 2024 09:29:19 EDT Ventricular Rate:  74 PR Interval:  186 QRS Duration:  82 QT Interval:  390 QTC Calculation: 432 R  Axis:   23  Text Interpretation: Normal sinus rhythm Normal ECG No acute ST/T wave changes Confirmed by Vannie Mora ([id number]) on 06/19/2024 9:58:36 AM       Risk Assessment/Calculations:            Physical Exam:   VS:  BP (!) 160/90 (BP Location: Right Arm, Patient Position: Sitting, Cuff Size: Normal)   Pulse 74   Resp 16   Ht 5' 5 (1.651 m)   Wt 118 lb 3.2 oz (53.6 kg)   SpO2 96%   BMI 19.67 kg/m    Wt Readings from Last 3 Encounters:  [name]/08/25 118 lb 3.2 oz (53.6 kg)  [name]/08/25 118 lb 11.2 oz (53.8 kg)  [name]/06/25 120 lb (54.4 kg)    Vitals:   [name]/08/25 0931  BP: (!) 160/90  Pulse: 74  Resp: 16  Height: 5' 5 (1.651 m)  Weight: 118 lb 3.2 oz (53.6 kg)  SpO2: 96%  BMI (Calculated): 19.67     GEN: Well nourished, well developed in no acute distress, anxious NECK: No JVD; No carotid bruits CARDIAC: RRR, no murmurs, rubs, gallops RESPIRATORY:  Clear to auscultation without rales, wheezing or rhonchi  ABDOMEN: Soft, non-tender, non-distended EXTREMITIES:  No edema; No deformity   ASSESSMENT AND PLAN: .     Coronary artery disease / HLD, LDL goal <55 - PCI/DES RCA 05/31/22. 08/2022 low risk myoview . Continue aspirin  81 mg, Atorvastatin  40mg  daily.  BB previously stopped due to bradycardia. 06/12/24 LDL 55. Recurrent exertional dyspnea and chest discomfort, plan for ischemic evaluation with LHC   Palpitations / Tachycardia / Bradycardia / Sinus pause 1.68 sec / Dizziness- Beta blocker (metoprolol ) previously discontinued due to bradycardia, sinus pause. Echo 03/02/24 normal LVEF 65-70%, gr1dd, no significant valvular abnormalities. Monitor 05/2024 after discontinuation of Metoprolol  to evaluate for recurrent dizziness in processing, not yet available for review.    PFO -incidental finding on CT.  Negative bubble study on prior echo.  History of stroke but was due to ischemia/stenosis. No indication for closure.    HTN / Ijames coat hypertension- BP in clinic elevated. Bp at home  well controlled. Presently taking Lisinopril  5mg  daily, continue same.  Hypothyroidism - Managed by primary care.   History of CVA - On Aspirin  81 mg and Brilinta  60 mg per interventional neurology. Follows with neurology, IR.   Middle cerebral artery stent - followed with Dr. Dolphus of inteventional radiology who is leaving the practice. Recommended 03/2024 for repeat  CT in 6 months. Will reach out to Dr. Lonni for recommendation for new interventional radiologist   Informed Consent   Shared Decision Making/Informed Consent The risks [stroke (1 in 1000), death (1 in 1000), kidney failure [usually temporary] (1 in 500), bleeding (1 in 200), allergic reaction [possibly serious] (1 in 200)], benefits (diagnostic support and management of coronary artery disease) and alternatives of a cardiac catheterization were discussed in detail with Ms. Scheibe and she is willing to proceed.       Dispo: follow up 2-3 weeks after LHC with Dr. Lonni or APP    Signed, Mora GORMAN Vannie, NP

## 2024-06-19 NOTE — Progress Notes (Signed)
 Subjective:    CC: Annual Physical Exam  HPI: Shannon [name] is a 78 y.o. presenting for annual physical  I reviewed the past medical history, family history, social history, surgical history, and allergies today and no changes were needed.  Please see the problem list section below in epic for further details.  Past Medical History: Past Medical History:  Diagnosis Date   Anxiety    Artery stenosis (HCC)    cerebral   Arthritis    Cancer (HCC)    skin - squamous cell   Complication of anesthesia     I had difficulty coming out of it   Dyspnea    with exertion   Family history of adverse reaction to anesthesia     My daughter has a problem coming ou to it   GERD (gastroesophageal reflux disease)    Headache    migraines in the past   Hypertension    Hypothyroidism    Stroke (HCC) 11/2018   some weakness on right side    SVT (supraventricular tachycardia) (HCC)    Thyroid  disease    Past Surgical History: Past Surgical History:  Procedure Laterality Date   ABDOMINAL HYSTERECTOMY     APPENDECTOMY     CARDIAC CATHETERIZATION     CARPAL TUNNEL RELEASE Bilateral    COLONOSCOPY     CORONARY STENT INTERVENTION N/A 05/31/2022   Procedure: CORONARY STENT INTERVENTION;  Surgeon: Dann Candyce RAMAN, MD;  Location: [facility] INVASIVE CV LAB;  Service: Cardiovascular;  Laterality: N/A;   CORONARY ULTRASOUND/IVUS N/A 05/31/2022   Procedure: Intravascular Ultrasound/IVUS;  Surgeon: Dann Candyce RAMAN, MD;  Location: [facility] INVASIVE CV LAB;  Service: Cardiovascular;  Laterality: N/A;   DILATION AND CURETTAGE OF UTERUS     IR ANGIO INTRA EXTRACRAN SEL COM CAROTID INNOMINATE BILAT MOD SED  12/25/2018   IR ANGIO INTRA EXTRACRAN SEL COM CAROTID INNOMINATE BILAT MOD SED  10/30/2019   IR ANGIO INTRA EXTRACRAN SEL COM CAROTID INNOMINATE BILAT MOD SED  09/23/2020   IR ANGIO VERTEBRAL SEL SUBCLAVIAN INNOMINATE UNI L MOD SED  12/25/2018   IR ANGIO VERTEBRAL SEL SUBCLAVIAN INNOMINATE UNI L MOD SED   10/30/2019   IR ANGIO VERTEBRAL SEL SUBCLAVIAN INNOMINATE UNI L MOD SED  09/23/2020   IR ANGIO VERTEBRAL SEL SUBCLAVIAN INNOMINATE UNI R MOD SED  01/04/2019   IR ANGIO VERTEBRAL SEL VERTEBRAL UNI R MOD SED  12/25/2018   IR ANGIO VERTEBRAL SEL VERTEBRAL UNI R MOD SED  10/30/2019   IR ANGIO VERTEBRAL SEL VERTEBRAL UNI R MOD SED  09/23/2020   IR CT HEAD LTD  01/04/2019   IR CT HEAD LTD  11/21/2019   IR INTRA CRAN STENT  01/04/2019   IR INTRA CRAN STENT  11/21/2019   IR RADIOLOGIST EVAL & MGMT  03/12/2021   IR RADIOLOGIST EVAL & MGMT  09/10/2021   IR RADIOLOGIST EVAL & MGMT  01/07/2023   IR RADIOLOGIST EVAL & MGMT  07/05/2023   IR RADIOLOGIST EVAL & MGMT  04/06/2024   IR TRANSCATH EXCRAN VERT OR CAR A STENT  11/17/2020   IR US  GUIDE VASC ACCESS RIGHT  12/25/2018   IR US  GUIDE VASC ACCESS RIGHT  11/02/2019   LEFT HEART CATH AND CORONARY ANGIOGRAPHY N/A 05/31/2022   Procedure: LEFT HEART CATH AND CORONARY ANGIOGRAPHY;  Surgeon: Dann Candyce RAMAN, MD;  Location: [facility] INVASIVE CV [facility];  Service: Cardiovascular;  Laterality: N/A;   OVARIAN CYST REMOVAL     RADIOLOGY WITH ANESTHESIA N/A 01/04/2019  Procedure: RMCA angioplasty with possible stenting;  Surgeon: Dolphus Carrion, MD;  Location: [facility] OR;  Service: Radiology;  Laterality: N/A;   RADIOLOGY WITH ANESTHESIA N/A 11/21/2019   Procedure: IR WITH ANESTHESIA/ STENT PLACEMENT;  Surgeon: Dolphus Carrion, MD;  Location: [facility] OR;  Service: Radiology;  Laterality: N/A;   RADIOLOGY WITH ANESTHESIA N/A 10/01/2020   Procedure: IR WITH ANESTHESIA ANGIOPLASTY WITH POSSIBLE STENTING;  Surgeon: Dolphus Carrion, MD;  Location: [facility] OR;  Service: Radiology;  Laterality: N/A;   RADIOLOGY WITH ANESTHESIA N/A 11/17/2020   Procedure: IR WITH ANESTHESIA STENT PLACEMENT;  Surgeon: Dolphus Carrion, MD;  Location: [facility] OR;  Service: Radiology;  Laterality: N/A;   Social History: Social History   Socioeconomic History   Marital status: Married    Spouse name: Not on file    Number of children: Not on file   Years of education: Not on file   Highest education level: Some college, no degree  Occupational History   Occupation: retired  Tobacco Use   Smoking status: Never    Passive exposure: Never   Smokeless tobacco: Never  Vaping Use   Vaping status: Never Used  Substance and Sexual Activity   Alcohol  use: Never   Drug use: Never   Sexual activity: Yes  Other Topics Concern   Not on file  Social History Narrative   Not on file   Social Drivers of Health   Financial Resource Strain: Low Risk  (12/20/2023)   Overall Financial Resource Strain (CARDIA)    Difficulty of Paying Living Expenses: Not hard at all  Food Insecurity: No Food Insecurity (12/20/2023)   Hunger Vital Sign    Worried About Running Out of Food in the Last Year: Never true    Ran Out of Food in the Last Year: Never true  Transportation Needs: No Transportation Needs (12/20/2023)   PRAPARE - Administrator, Civil Service (Medical): No    Lack of Transportation (Non-Medical): No  Physical Activity: Sufficiently Active (12/20/2023)   Exercise Vital Sign    Days of Exercise per Week: 6 days    Minutes of Exercise per Session: 30 min  Stress: Stress Concern Present (12/20/2023)   [employer] of Occupational Health - Occupational Stress Questionnaire    Feeling of Stress : To some extent  Social Connections: Socially Integrated (12/20/2023)   Social Connection and Isolation Panel    Frequency of Communication with Friends and Family: More than three times a week    Frequency of Social Gatherings with Friends and Family: Patient declined    Attends Religious Services: More than 4 times per year    Active Member of [employer] or Organizations: Yes    Attends Engineer, structural: More than 4 times per year    Marital Status: Married   Family History: Family History  Problem Relation Age of Onset   CVA Mother    Heart disease Mother    Peripheral Artery Disease  Father    Breast cancer Sister    Breast cancer Sister    CVA Maternal Aunt    Allergies: Allergies  Allergen Reactions   Diltiazem      shortness of breath, heaviness in chest, and fatigue    Elastic Bandages & [Zinc] Itching   Isosorbide  Mononitrate [Isosorbide  Nitrate]     Either palpitations or heaviness in chest, can not remember    Metoprolol      Low heart rate    Sulfa Antibiotics Other (See Comments)    Tongue  turned black   Tape     Pulled blood through the skin- happened during a procedure in Oct 2021    Mandol [Cefamandole] Rash   Sinutab Non-Drowsy Max [Pseudoephedrine-Acetaminophen ] Palpitations   Medications: See med rec.  Review of Systems: No headache, visual changes, nausea, vomiting, diarrhea, constipation, dizziness, abdominal pain, skin rash, fevers, chills, night sweats, swollen lymph nodes, weight loss, chest pain, body aches, joint swelling, muscle aches, shortness of breath, mood changes, visual or auditory hallucinations.  Objective:    BP 135/63 (BP Location: Right Arm, Patient Position: Sitting, Cuff Size: Normal)   Pulse 75   Ht 5' 5 (1.651 m)   Wt 118 lb 11.2 oz (53.8 kg)   SpO2 100%   BMI 19.75 kg/m   General: Well Developed, well nourished, and in no acute distress. Neuro: Alert and oriented x3, extra-ocular muscles intact, sensation grossly intact. Cranial nerves II through XII are intact, motor, sensory, and coordinative functions are all intact. HEENT: Normocephalic, atraumatic, pupils equal round reactive to light, neck supple, no masses, no lymphadenopathy, thyroid  nonpalpable. Oropharynx, nasopharynx, external ear canals are unremarkable. Skin: Warm and dry, no rashes noted. Cardiac: Regular rate and rhythm, no murmurs rubs or gallops.  Respiratory: Clear to auscultation bilaterally. Not using accessory muscles, speaking in full sentences. Abdominal: Soft, nontender, nondistended, positive bowel sounds, no masses, no  organomegaly. Musculoskeletal: Shoulder, elbow, wrist, hip, knee, ankle stable, and with full range of motion.  Impression and Recommendations:    Wellness examination Assessment & Plan: Routine HCM labs ordered. HCM reviewed/discussed. Anticipatory guidance regarding healthy weight, lifestyle and choices given. Recommend healthy diet.  Recommend approximately 150 minutes/week of moderate intensity exercise Recommend regular dental and vision exams Always use seatbelt/lap and shoulder restraints Recommend using smoke alarms and checking batteries at least twice a year Recommend using sunscreen when outside Discussed colon cancer screening recommendations, options.  Patient will consider and let us  know how he would like to proceed Discussed recommendations for shingles vaccine.  Patient will consider Discussed immunization recommendations   VBI (vertebrobasilar insufficiency) -     Ambulatory referral to Interventional Radiology  History of CVA (cerebrovascular accident) Assessment & Plan: Patient has been having regular follow-up with Dr. Barnie Casey, unfortunately she was advised that he would be retiring and she is needing to establish with new provider.  She initially thought that he was a neurosurgeon, but did discuss that he is an interventional radiologist.  Referral placed for patient to establish with new interventional radiologist for continued follow-up.  Orders: -     Ambulatory referral to Interventional Radiology  Osteoporosis screening -     DG Bone Density; Future  Osteopenia, unspecified location -     DG Bone Density; Future  Menopause -     DG Bone Density; Future  Patient additionally has some concerns related to her husband.  Reports concerns regarding his memory/episodes of forgetfulness.  Return in about 6 weeks (around 07/31/2024) for 40 minutes.   ___________________________________________ Jotham Ahn de [place], MD, ABFM, CAQSM Primary Care and  Sports Medicine [facility]

## 2024-06-19 NOTE — Patient Instructions (Signed)
  Medication Instructions:  Your physician recommends that you continue on your current medications as directed. Please refer to the Current Medication list given to you today. --If you need a refill on any your medications before your next appointment, please call your pharmacy first. If no refills are authorized on file call the office.--   Follow-Up: Your next appointment:   Your physician recommends that you schedule a follow-up appointment in: 2 month follow up  with Dr. de Peru  You will receive a text message or e-mail with a link to a survey about your care and experience with [place] today! We would greatly appreciate your feedback!   Thanks for letting us be apart of your health journey!!  Primary Care and Sports Medicine   Dr. Ceasar Mons Peru   We encourage you to activate your patient portal called "MyChart".  Sign up information is provided on this After Visit Summary.  MyChart is used to connect with patients for Virtual Visits (Telemedicine).  Patients are able to view lab/test results, encounter notes, upcoming appointments, etc.  Non-urgent messages can be sent to your provider as well. To learn more about what you can do with MyChart, please visit --  [email or web address].

## 2024-06-19 NOTE — Progress Notes (Signed)
 Cardiology Office Note:  .   Date:  06/24/2024  ID:  Dymond, Gutt August 19, 1946, MRN [id number] PCP: de [place], Raymond J, MD  [facility] Providers Cardiologist:  Shelda Bruckner, MD    History of Present Illness: .   Shannon [name] is a 78 y.o. female  with a hx of hypertension, CVA, CAD.   Prior cardiac cath (date unknown) with reported 30% RCA lesion. Prior echo 2019 with normal LVEF, negative bubble study. Previous ETT 02/26/21 with fair exercise tolerance, 1mm up-sloping to horizontal ST depressions in inferior lateral leads of borderline significance.  Due to chest pain cardiac CTA performed 12/03/2021 with coronary calcium  score 150 placing her in the 67th percentile for age and sex matched control.  She had noncalcified plaques in the proximal RCA with moderate 50 to 69% stenosis, calcified plaque in the proximal RCA, LAD, proximal LCx with 0 to 24% stenosis.  Noted PFO.  FFR on RCA of 0.8 suggesting borderline likelihood of hemodynamic significance.  She was started on Imdur  but did not tolerate.  Previously did not tolerate increased doses of metoprolol .   At follow up 05/20/22 noted exertional dyspnea and chest pain. Cardiac cath ordered and performed with PCI/DES to prox RCA, nonobstructive disease (dist LAD, mid-dist Cx 10%). Discharged on Brilinta  90mg  BID for at least 6 months though 3 months could be considered if bleeding issues.     Myoview  08/24/22 low risk study, no evidence of ischemia nor infarction, EF 68%. Echo 03/06/24 LVEF 65-70%, gr1dd, mild LVH, no significant valvular abnormalities.   04/05/24 CTA head and neck with stable right sided stented middle cerebral artery. Left middle cerebral artery M1 segment with decreased caliber proximally as well as decreased caliber of one of the bifurcation branches of left middle cerebral artery. MRI brain ordered, CTA heead and neck recommended for 6 mos. MR 04/30/24 no acut4e intracranial finding, unchanged chronic infarcts,  several nonspecific chronic microhemorrhages stable from previous.   Due to persistent dizziness, repeat ZIO monitor while off beta blocker ordered. Monitor processing data as of 06/11/24, not yet available for review.   ED visit 06/17/24 with exertional dyspnea as well as intermittent lower chest pain and lightheadedness. Troponin negative x2. EKG nonacute. D-Dimer elevated with CTA without PE nor pneumonia.   History of Present Illness Shannon [name] is a 78 year old female with coronary artery disease who presents with worsening shortness of breath and dizziness.  Since Friday, she experiences worsening shortness of breath and dizziness, initially occurring during a walk with heaviness and difficulty breathing, progressing to dizziness. She required frequent rest, extending her usual 30-minute walk to over an hour. Symptoms persisted over the weekend, with significant difficulty breathing and dizziness at church, necessitating assistance and medical attention.  On Sunday, her heart rate was 54, and her blood pressure was elevated when checked by nurses at church. She was transported to [facility] for further evaluation. A chest x-ray and CT scan showed no acute issues. She has a history of stent placement and long-standing breathing problems.  She monitors her blood pressure at home, noting fluctuations. Recently, she adjusted her lisinopril  intake to after her morning walk due to low readings post-exercise. Recent elevated readings include 150/90 in the early morning.  Shortness of breath occurs with exertion, such as walking or housework, accompanied by chest pressure and a sensation of food being stuck.   ROS: Please see the history of present illness.    All other systems reviewed and  are negative.   Studies Reviewed: SABRA   EKG Interpretation Date/Time:  Tuesday June 19 2024 09:29:19 EDT Ventricular Rate:  74 PR Interval:  186 QRS Duration:  82 QT Interval:  390 QTC Calculation: 432 R  Axis:   23  Text Interpretation: Normal sinus rhythm Normal ECG No acute ST/T wave changes Confirmed by Vannie Mora ([id number]) on 06/19/2024 9:58:36 AM       Risk Assessment/Calculations:            Physical Exam:   VS:  BP (!) 160/90 (BP Location: Right Arm, Patient Position: Sitting, Cuff Size: Normal)   Pulse 74   Resp 16   Ht 5' 5 (1.651 m)   Wt 118 lb 3.2 oz (53.6 kg)   SpO2 96%   BMI 19.67 kg/m    Wt Readings from Last 3 Encounters:  06/19/24 118 lb 3.2 oz (53.6 kg)  06/19/24 118 lb 11.2 oz (53.8 kg)  06/17/24 120 lb (54.4 kg)    Vitals:   06/19/24 0931  BP: (!) 160/90  Pulse: 74  Resp: 16  Height: 5' 5 (1.651 m)  Weight: 118 lb 3.2 oz (53.6 kg)  SpO2: 96%  BMI (Calculated): 19.67     GEN: Well nourished, well developed in no acute distress, anxious NECK: No JVD; No carotid bruits CARDIAC: RRR, no murmurs, rubs, gallops RESPIRATORY:  Clear to auscultation without rales, wheezing or rhonchi  ABDOMEN: Soft, non-tender, non-distended EXTREMITIES:  No edema; No deformity   ASSESSMENT AND PLAN: .     Coronary artery disease / HLD, LDL goal <55 - PCI/DES RCA 05/31/22. 08/2022 low risk myoview . Continue aspirin  81 mg, Atorvastatin  40mg  daily.  BB previously stopped due to bradycardia. 06/12/24 LDL 55. Recurrent exertional dyspnea and chest discomfort, plan for ischemic evaluation with [facility]   Palpitations / Tachycardia / Bradycardia / Sinus pause 1.68 sec / Dizziness- Beta blocker (metoprolol ) previously discontinued due to bradycardia, sinus pause. Echo 03/02/24 normal LVEF 65-70%, gr1dd, no significant valvular abnormalities. Monitor 05/2024 after discontinuation of Metoprolol  to evaluate for recurrent dizziness in processing, not yet available for review.    PFO -incidental finding on CT.  Negative bubble study on prior echo.  History of stroke but was due to ischemia/stenosis. No indication for closure.    HTN / Ijames coat hypertension- BP in clinic elevated. Bp at home  well controlled. Presently taking Lisinopril  5mg  daily, continue same.  Hypothyroidism - Managed by primary care.   History of CVA - On Aspirin  81 mg and Brilinta  60 mg per interventional neurology. Follows with neurology, IR.   Middle cerebral artery stent - followed with Dr. Dolphus of inteventional radiology who is leaving the practice. Recommended 03/2024 for repeat  CT in 6 months. Will reach out to Dr. Lonni for recommendation for new interventional radiologist   Informed Consent   Shared Decision Making/Informed Consent The risks [stroke (1 in 1000), death (1 in 1000), kidney failure [usually temporary] (1 in 500), bleeding (1 in 200), allergic reaction [possibly serious] (1 in 200)], benefits (diagnostic support and management of coronary artery disease) and alternatives of a cardiac catheterization were discussed in detail with Ms. Scheibe and she is willing to proceed.       Dispo: follow up 2-3 weeks after LHC with Dr. Lonni or APP    Signed, Mora GORMAN Vannie, NP

## 2024-06-21 ENCOUNTER — Telehealth (HOSPITAL_COMMUNITY): Payer: Self-pay

## 2024-06-21 ENCOUNTER — Other Ambulatory Visit (HOSPITAL_COMMUNITY): Payer: Self-pay | Admitting: Family Medicine

## 2024-06-21 DIAGNOSIS — I771 Stricture of artery: Secondary | ICD-10-CM

## 2024-06-21 DIAGNOSIS — G459 Transient cerebral ischemic attack, unspecified: Secondary | ICD-10-CM

## 2024-06-21 DIAGNOSIS — I639 Cerebral infarction, unspecified: Secondary | ICD-10-CM

## 2024-06-21 DIAGNOSIS — I6601 Occlusion and stenosis of right middle cerebral artery: Secondary | ICD-10-CM

## 2024-06-21 NOTE — Telephone Encounter (Signed)
 Returned pt's call regarding referral, no answer, left vm. AB

## 2024-06-21 NOTE — Telephone Encounter (Signed)
 Pt returned call, she would like to be referred to Dr. Lester or Dr. Rosslyn since Dr. Dolphus is leaving. AB

## 2024-06-26 ENCOUNTER — Telehealth: Payer: Self-pay | Admitting: *Deleted

## 2024-06-26 NOTE — Assessment & Plan Note (Signed)
 Patient has been having regular follow-up with Dr. Barnie Casey, unfortunately she was advised that he would be retiring and she is needing to establish with new provider.  She initially thought that he was a neurosurgeon, but did discuss that he is an interventional radiologist.  Referral placed for patient to establish with new interventional radiologist for continued follow-up.

## 2024-06-26 NOTE — Telephone Encounter (Signed)
 Cardiac Catheterization scheduled at [facility] for: Wednesday June 27, 2024 10 AM Arrival time [facility] Main Entrance A at: 8 AM  Nothing to eat after midnight prior to procedure, clear liquids until 5 AM day of procedure.  Medication instructions: -Usual morning medications can be taken with sips of water including aspirin  81 mg and Brilinta  90 mg.  Plan to go home the same day, you will only stay overnight if medically necessary.  You must have responsible adult to drive you home.  Someone must be with you the first 24 hours after you arrive home.  Reviewed procedure instructions with patient.

## 2024-06-27 ENCOUNTER — Ambulatory Visit (HOSPITAL_COMMUNITY)
Admission: RE | Admit: 2024-06-27 | Discharge: 2024-06-27 | Disposition: A | Discharge status: Home / Self Care | Attending: Cardiovascular Disease | Admitting: Cardiovascular Disease

## 2024-06-27 ENCOUNTER — Other Ambulatory Visit: Payer: Self-pay

## 2024-06-27 ENCOUNTER — Encounter (HOSPITAL_COMMUNITY)
Admission: RE | Disposition: A | Discharge status: Home / Self Care | Payer: Self-pay | Source: Home / Self Care | Attending: Cardiovascular Disease

## 2024-06-27 ENCOUNTER — Encounter (HOSPITAL_COMMUNITY): Payer: Self-pay | Admitting: Cardiovascular Disease

## 2024-06-27 DIAGNOSIS — I10 Essential (primary) hypertension: Secondary | ICD-10-CM | POA: Diagnosis not present

## 2024-06-27 DIAGNOSIS — Z7982 Long term (current) use of aspirin: Secondary | ICD-10-CM | POA: Insufficient documentation

## 2024-06-27 DIAGNOSIS — I25119 Atherosclerotic heart disease of native coronary artery with unspecified angina pectoris: Secondary | ICD-10-CM | POA: Insufficient documentation

## 2024-06-27 DIAGNOSIS — Z95828 Presence of other vascular implants and grafts: Secondary | ICD-10-CM | POA: Insufficient documentation

## 2024-06-27 DIAGNOSIS — I2510 Atherosclerotic heart disease of native coronary artery without angina pectoris: Secondary | ICD-10-CM | POA: Diagnosis not present

## 2024-06-27 DIAGNOSIS — E039 Hypothyroidism, unspecified: Secondary | ICD-10-CM | POA: Diagnosis not present

## 2024-06-27 DIAGNOSIS — R0602 Shortness of breath: Secondary | ICD-10-CM | POA: Diagnosis present

## 2024-06-27 DIAGNOSIS — Z8673 Personal history of transient ischemic attack (TIA), and cerebral infarction without residual deficits: Secondary | ICD-10-CM | POA: Insufficient documentation

## 2024-06-27 DIAGNOSIS — E785 Hyperlipidemia, unspecified: Secondary | ICD-10-CM | POA: Diagnosis not present

## 2024-06-27 DIAGNOSIS — Z79899 Other long term (current) drug therapy: Secondary | ICD-10-CM | POA: Insufficient documentation

## 2024-06-27 DIAGNOSIS — Z955 Presence of coronary angioplasty implant and graft: Secondary | ICD-10-CM | POA: Insufficient documentation

## 2024-06-27 DIAGNOSIS — Q2112 Patent foramen ovale: Secondary | ICD-10-CM | POA: Diagnosis not present

## 2024-06-27 HISTORY — PX: LEFT HEART CATH AND CORONARY ANGIOGRAPHY: CATH118249

## 2024-06-27 SURGERY — LEFT HEART CATH AND CORONARY ANGIOGRAPHY
Anesthesia: LOCAL

## 2024-06-27 MED ORDER — IOHEXOL 350 MG/ML SOLN
INTRAVENOUS | Status: DC | PRN
  Administered 2024-06-27: 30 mL

## 2024-06-27 MED ORDER — SODIUM CHLORIDE 0.9% FLUSH: 3.0000 mL | Freq: Two times a day (BID) | INTRAVENOUS | Status: DC

## 2024-06-27 MED ORDER — HYDRALAZINE HCL 20 MG/ML IJ SOLN
INTRAMUSCULAR | Status: DC | PRN
  Administered 2024-06-27: 10 mg via INTRAVENOUS

## 2024-06-27 MED ORDER — HEPARIN SODIUM (PORCINE) 1000 UNIT/ML IJ SOLN
INTRAMUSCULAR | Status: DC | PRN
Start: 2024-06-27 — End: 2024-06-27
  Administered 2024-06-27: 3000 [IU] via INTRAVENOUS

## 2024-06-27 MED ORDER — HEPARIN (PORCINE) IN NACL 1000-0.9 UT/500ML-% IV SOLN
INTRAVENOUS | Status: DC | PRN
  Administered 2024-06-27 (×2): 500 mL

## 2024-06-27 MED ORDER — SODIUM CHLORIDE 0.9 % IV SOLN: 250.0000 mL | INTRAVENOUS | Status: DC | PRN

## 2024-06-27 MED ORDER — SODIUM CHLORIDE 0.9% FLUSH: 3.0000 mL | INTRAVENOUS | Status: DC | PRN

## 2024-06-27 MED ORDER — ONDANSETRON HCL 4 MG/2ML IJ SOLN: 4.0000 mg | Freq: Four times a day (QID) | INTRAMUSCULAR | Status: DC | PRN

## 2024-06-27 MED ORDER — HEPARIN (PORCINE) IN NACL 2000-0.9 UNIT/L-% IV SOLN
INTRAVENOUS | Status: DC | PRN
Start: 2024-06-27 — End: 2024-06-27
  Administered 2024-06-27: 1000 mL

## 2024-06-27 MED ORDER — SODIUM CHLORIDE 0.9 % WEIGHT BASED INFUSION: 3.0000 mL/kg/h | INTRAVENOUS | Status: AC

## 2024-06-27 MED ORDER — HEPARIN (PORCINE) IN NACL 2-0.9 UNITS/ML
INTRAMUSCULAR | Status: DC | PRN
  Administered 2024-06-27: 10 mL via INTRA_ARTERIAL

## 2024-06-27 MED ORDER — ASPIRIN 81 MG PO CHEW: 81.0000 mg | CHEWABLE_TABLET | ORAL | Status: DC

## 2024-06-27 MED ORDER — FENTANYL CITRATE (PF) 100 MCG/2ML IJ SOLN
INTRAMUSCULAR | Status: AC
Start: 2024-06-27 — End: 2024-06-27
  Filled 2024-06-27: qty 2

## 2024-06-27 MED ORDER — HYDRALAZINE HCL 20 MG/ML IJ SOLN
INTRAMUSCULAR | Status: AC
  Filled 2024-06-27: qty 1

## 2024-06-27 MED ORDER — MIDAZOLAM HCL 2 MG/2ML IJ SOLN
INTRAMUSCULAR | Status: AC
  Filled 2024-06-27: qty 2

## 2024-06-27 MED ORDER — SODIUM CHLORIDE 0.9 % IV SOLN: INTRAVENOUS | Status: AC

## 2024-06-27 MED ORDER — VERAPAMIL HCL 2.5 MG/ML IV SOLN
INTRAVENOUS | Status: AC
  Filled 2024-06-27: qty 2

## 2024-06-27 MED ORDER — LIDOCAINE HCL (PF) 1 % IJ SOLN
INTRAMUSCULAR | Status: DC | PRN
  Administered 2024-06-27: 5 mL

## 2024-06-27 MED ORDER — FENTANYL CITRATE (PF) 100 MCG/2ML IJ SOLN
INTRAMUSCULAR | Status: DC | PRN
  Administered 2024-06-27: 50 ug via INTRAVENOUS

## 2024-06-27 MED ORDER — SODIUM CHLORIDE 0.9 % WEIGHT BASED INFUSION: 1.0000 mL/kg/h | INTRAVENOUS | Status: DC

## 2024-06-27 MED ORDER — HYDRALAZINE HCL 20 MG/ML IJ SOLN: 10.0000 mg | INTRAMUSCULAR | Status: DC | PRN

## 2024-06-27 MED ORDER — MIDAZOLAM HCL 2 MG/2ML IJ SOLN
INTRAMUSCULAR | Status: DC | PRN
Start: 2024-06-27 — End: 2024-06-27
  Administered 2024-06-27: 1 mg via INTRAVENOUS

## 2024-06-27 MED ORDER — LIDOCAINE HCL (PF) 1 % IJ SOLN
INTRAMUSCULAR | Status: AC
  Filled 2024-06-27: qty 30

## 2024-06-27 MED ORDER — HEPARIN SODIUM (PORCINE) 1000 UNIT/ML IJ SOLN
INTRAMUSCULAR | Status: AC
  Filled 2024-06-27: qty 10

## 2024-06-27 SURGICAL SUPPLY — 6 items
CATH 5FR JL3.5 JR4 ANG PIG MP (CATHETERS) IMPLANT
DEVICE RAD COMP TR BAND LRG (VASCULAR PRODUCTS) IMPLANT
GLIDESHEATH SLEND SS 6F .021 (SHEATH) IMPLANT
GUIDEWIRE INQWIRE 1.5J.035X260 (WIRE) IMPLANT
PACK CARDIAC CATHETERIZATION (CUSTOM PROCEDURE TRAY) ×1 IMPLANT
SET ATX-X65L (MISCELLANEOUS) IMPLANT

## 2024-06-27 NOTE — Discharge Instructions (Signed)
 Radial Site Care  This sheet gives you information about how to care for yourself after your procedure. Your health care provider may also give you more specific instructions. If you have problems or questions, contact your health care provider. What can I expect after the procedure? After the procedure, it is common to have: Bruising and tenderness at the catheter insertion area. Follow these instructions at home: Medicines Take over-the-counter and prescription medicines only as told by your health care provider. Insertion site care Follow instructions from your health care provider about how to take care of your insertion site. Make sure you: Wash your hands with soap and water before you remove your bandage (dressing). If soap and water are not available, use hand sanitizer. May remove dressing in 24 hours. Check your insertion site every day for signs of infection. Check for: Redness, swelling, or pain. Fluid or blood. Pus or a bad smell. Warmth. Do no take baths, swim, or use a hot tub for 5 days. You may shower 24-48 hours after the procedure. Remove the dressing and gently wash the site with plain soap and water. Pat the area dry with a clean towel. Do not rub the site. That could cause bleeding. Do not apply powder or lotion to the site. Activity  For 24 hours after the procedure, or as directed by your health care provider: Do not flex or bend the affected arm. Do not push or pull heavy objects with the affected arm. Do not drive yourself home from the hospital or clinic. You may drive 24 hours after the procedure. Do not operate machinery or power tools. KEEP ARM ELEVATED THE REMAINDER OF THE DAY. Do not push, pull or lift anything that is heavier than 10 lb for 5 days. Ask your health care provider when it is okay to: Return to work or school. Resume usual physical activities or sports. Resume sexual activity. General instructions If the catheter site starts to  bleed, raise your arm and put firm pressure on the site. If the bleeding does not stop, get help right away. This is a medical emergency. DRINK PLENTY OF FLUIDS FOR THE NEXT 2-3 DAYS. No alcohol consumption for 24 hours after receiving sedation. If you went home on the same day as your procedure, a responsible adult should be with you for the first 24 hours after you arrive home. Keep all follow-up visits as told by your health care provider. This is important. Contact a health care provider if: You have a fever. You have redness, swelling, or yellow drainage around your insertion site. Get help right away if: You have unusual pain at the radial site. The catheter insertion area swells very fast. The insertion area is bleeding, and the bleeding does not stop when you hold steady pressure on the area. Your arm or hand becomes pale, cool, tingly, or numb. These symptoms may represent a serious problem that is an emergency. Do not wait to see if the symptoms will go away. Get medical help right away. Call your local emergency services (911 in the [place] Do not drive yourself to the hospital. Summary After the procedure, it is common to have bruising and tenderness at the site. Follow instructions from your health care provider about how to take care of your radial site wound. Check the wound every day for signs of infection.  This information is not intended to replace advice given to you by your health care provider. Make sure you discuss any questions you have with  your health care provider. Document Revised: 01/04/2018 Document Reviewed: 01/04/2018 Elsevier Patient Education  2020 [employer].

## 2024-06-27 NOTE — Interval H&P Note (Signed)
 History and Physical Interval Note:  06/27/2024 9:33 AM  Shannon [name]  has presented today for surgery, with the diagnosis of angina - cad.  The various methods of treatment have been discussed with the patient and family. After consideration of risks, benefits and other options for treatment, the patient has consented to  Procedure(s): LEFT HEART CATH AND CORONARY ANGIOGRAPHY (N/A) as a surgical intervention.  The patient's history has been reviewed, patient examined, no change in status, stable for surgery.  I have reviewed the patient's chart and labs.  Questions were answered to the patient's satisfaction.    Cath Lab Visit (complete for each Cath Lab visit)  Clinical Evaluation Leading to the Procedure:   ACS: No.  Non-ACS:    Anginal Classification: CCS III  Anti-ischemic medical therapy: No Therapy  Non-Invasive Test Results: No non-invasive testing performed  Prior CABG: No previous CABG        [name]

## 2024-07-04 ENCOUNTER — Encounter (HOSPITAL_BASED_OUTPATIENT_CLINIC_OR_DEPARTMENT_OTHER): Payer: Self-pay

## 2024-07-04 ENCOUNTER — Ambulatory Visit (HOSPITAL_BASED_OUTPATIENT_CLINIC_OR_DEPARTMENT_OTHER): Admitting: *Deleted

## 2024-07-04 DIAGNOSIS — Z0000 Encounter for general adult medical examination without abnormal findings: Secondary | ICD-10-CM | POA: Diagnosis not present

## 2024-07-04 NOTE — Progress Notes (Signed)
 Subjective:   Shannon [name] is a 78 y.o. female who presents for Medicare Annual (Subsequent) preventive examination.  Visit Complete: Virtual I connected with  Shannon [name] on 07/04/24 by a audio enabled telemedicine application and verified that I am speaking with the correct person using two identifiers.  Patient Location: Home  Provider Location: Office/Clinic  I discussed the limitations of evaluation and management by telemedicine. The patient expressed understanding and agreed to proceed.  Vital Signs: Because this visit was a virtual/telehealth visit, some criteria may be missing or patient reported. Any vitals not documented were not able to be obtained and vitals that have been documented are patient reported.  Patient Medicare AWV questionnaire was completed by the patient on 07/04/24; I have confirmed that all information answered by patient is correct and no changes since this date.        Objective:    There were no vitals filed for this visit. There is no height or weight on file to calculate BMI.     06/27/2024    8:12 AM 06/17/2024   11:05 AM 03/14/2024    2:15 PM 06/17/2022   10:00 AM 05/31/2022    5:50 AM 11/17/2020    7:23 AM 10/01/2020    6:43 AM  Advanced Directives  Does Patient Have a Medical Advance Directive? No No Yes No No No No  Type of Advance Directive   Living will      Does patient want to make changes to medical advance directive?   No - Patient declined      Would patient like information on creating a medical advance directive? No - Patient declined   Yes (MAU/Ambulatory/Procedural Areas - Information given) No - Patient declined No - Patient declined No - Patient declined    Current Medications (verified) Outpatient Encounter Medications as of 07/04/2024  Medication Sig   acetaminophen  (TYLENOL ) 325 MG tablet Take 325 mg by mouth every 6 (six) hours as needed for headache.    Artificial Tear Solution (SOOTHE XP) SOLN Place 1 drop into both  eyes as needed (dry eyes).   aspirin  EC 81 MG EC tablet Take 1 tablet (81 mg total) by mouth daily.   atorvastatin  (LIPITOR) 40 MG tablet Take 1 tablet (40 mg total) by mouth daily at 6 PM.   Calcium  Carb-Cholecalciferol (CALCIUM  600 + D PO) Take 300-600 mg by mouth See admin instructions. Take 600 mg after breakfast, 600 mg after lunch, and 300 mg after dinner   Coenzyme Q10 50 MG CAPS Take 50 mg by mouth daily.    escitalopram (LEXAPRO) 5 MG tablet Take 5 mg by mouth daily.   levothyroxine  (SYNTHROID ) 75 MCG tablet Take 75 mcg by mouth every Tuesday, Thursday, Saturday, and Sunday.    levothyroxine  (SYNTHROID , LEVOTHROID) 50 MCG tablet Take 50 mcg by mouth every Monday, Wednesday, and Friday.    lisinopril  (ZESTRIL ) 5 MG tablet Take 1 tablet (5 mg total) by mouth daily.   loratadine  (CLARITIN ) 10 MG tablet Take 10 mg by mouth daily.   Multiple Vitamin (MULTIVITAMIN) capsule Take 1 capsule by mouth daily. Senior   nitroGLYCERIN  (NITROSTAT ) 0.4 MG SL tablet Place 0.4 mg under the tongue every 5 (five) minutes as needed for chest pain.   Polyvinyl Alcohol -Povidone PF (REFRESH) 1.4-0.6 % SOLN Apply 1 drop to eye as needed (dry eye). Both eyes   ticagrelor  (BRILINTA ) 60 MG TABS tablet Take 0.5 tablets (30 mg total) by mouth 2 (two) times daily.  vitamin E  400 UNIT capsule Take 400 Units by mouth every other day.    No facility-administered encounter medications on file as of 07/04/2024.    Allergies (verified) Diltiazem , Elastic bandages & [zinc], Isosorbide  mononitrate [isosorbide  nitrate], Metoprolol , Sulfa antibiotics, Tape, Mandol [cefamandole], and Sinutab non-drowsy max [pseudoephedrine-acetaminophen ]   History: Past Medical History:  Diagnosis Date   Anxiety    Artery stenosis (HCC)    cerebral   Arthritis    Cancer (HCC)    skin - squamous cell   Complication of anesthesia     I had difficulty coming out of it   Dyspnea    with exertion   Family history of adverse reaction  to anesthesia     My daughter has a problem coming ou to it   GERD (gastroesophageal reflux disease)    Headache    migraines in the past   Hypertension    Hypothyroidism    Stroke (HCC) 11/2018   some weakness on right side    SVT (supraventricular tachycardia) (HCC)    Thyroid  disease    Past Surgical History:  Procedure Laterality Date   ABDOMINAL HYSTERECTOMY     APPENDECTOMY     CARDIAC CATHETERIZATION     CARPAL TUNNEL RELEASE Bilateral    COLONOSCOPY     CORONARY STENT INTERVENTION N/A 05/31/2022   Procedure: CORONARY STENT INTERVENTION;  Surgeon: Dann Candyce RAMAN, MD;  Location: [facility];  Service: Cardiovascular;  Laterality: N/A;   CORONARY ULTRASOUND/IVUS N/A 05/31/2022   Procedure: Intravascular Ultrasound/IVUS;  Surgeon: Dann Candyce RAMAN, MD;  Location: [facility] INVASIVE CV LAB;  Service: Cardiovascular;  Laterality: N/A;   DILATION AND CURETTAGE OF UTERUS     IR ANGIO INTRA EXTRACRAN SEL COM CAROTID INNOMINATE BILAT MOD SED  12/25/2018   IR ANGIO INTRA EXTRACRAN SEL COM CAROTID INNOMINATE BILAT MOD SED  10/30/2019   IR ANGIO INTRA EXTRACRAN SEL COM CAROTID INNOMINATE BILAT MOD SED  09/23/2020   IR ANGIO VERTEBRAL SEL SUBCLAVIAN INNOMINATE UNI L MOD SED  12/25/2018   IR ANGIO VERTEBRAL SEL SUBCLAVIAN INNOMINATE UNI L MOD SED  10/30/2019   IR ANGIO VERTEBRAL SEL SUBCLAVIAN INNOMINATE UNI L MOD SED  09/23/2020   IR ANGIO VERTEBRAL SEL SUBCLAVIAN INNOMINATE UNI R MOD SED  01/04/2019   IR ANGIO VERTEBRAL SEL VERTEBRAL UNI R MOD SED  12/25/2018   IR ANGIO VERTEBRAL SEL VERTEBRAL UNI R MOD SED  10/30/2019   IR ANGIO VERTEBRAL SEL VERTEBRAL UNI R MOD SED  09/23/2020   IR CT HEAD LTD  01/04/2019   IR CT HEAD LTD  11/21/2019   IR INTRA CRAN STENT  01/04/2019   IR INTRA CRAN STENT  11/21/2019   IR RADIOLOGIST EVAL & MGMT  03/12/2021   IR RADIOLOGIST EVAL & MGMT  09/10/2021   IR RADIOLOGIST EVAL & MGMT  01/07/2023   IR RADIOLOGIST EVAL & MGMT  07/05/2023   IR RADIOLOGIST  EVAL & MGMT  04/06/2024   IR TRANSCATH EXCRAN VERT OR CAR A STENT  11/17/2020   IR US  GUIDE VASC ACCESS RIGHT  12/25/2018   IR US  GUIDE VASC ACCESS RIGHT  11/02/2019   LEFT HEART CATH AND CORONARY ANGIOGRAPHY N/A 05/31/2022   Procedure: LEFT HEART CATH AND CORONARY ANGIOGRAPHY;  Surgeon: Dann Candyce RAMAN, MD;  Location: [facility];  Service: Cardiovascular;  Laterality: N/A;   LEFT HEART CATH AND CORONARY ANGIOGRAPHY N/A 06/27/2024   Procedure: LEFT HEART CATH AND CORONARY ANGIOGRAPHY;  Surgeon: Verlin,  Lonni BIRCH, MD;  Location: [facility];  Service: Cardiovascular;  Laterality: N/A;   OVARIAN CYST REMOVAL     RADIOLOGY WITH ANESTHESIA N/A 01/04/2019   Procedure: RMCA angioplasty with possible stenting;  Surgeon: Dolphus Carrion, MD;  Location: [facility] OR;  Service: Radiology;  Laterality: N/A;   RADIOLOGY WITH ANESTHESIA N/A 11/21/2019   Procedure: IR WITH ANESTHESIA/ STENT PLACEMENT;  Surgeon: Dolphus Carrion, MD;  Location: [facility] OR;  Service: Radiology;  Laterality: N/A;   RADIOLOGY WITH ANESTHESIA N/A 10/01/2020   Procedure: IR WITH ANESTHESIA ANGIOPLASTY WITH POSSIBLE STENTING;  Surgeon: Dolphus Carrion, MD;  Location: [facility] OR;  Service: Radiology;  Laterality: N/A;   RADIOLOGY WITH ANESTHESIA N/A 11/17/2020   Procedure: IR WITH ANESTHESIA STENT PLACEMENT;  Surgeon: Dolphus Carrion, MD;  Location: [facility] OR;  Service: Radiology;  Laterality: N/A;   Family History  Problem Relation Age of Onset   CVA Mother    Heart disease Mother    Peripheral Artery Disease Father    Breast cancer Sister    Breast cancer Sister    CVA Maternal Aunt    Social History   Socioeconomic History   Marital status: Married    Spouse name: Not on file   Number of children: Not on file   Years of education: Not on file   Highest education level: Some college, no degree  Occupational History   Occupation: retired  Tobacco Use   Smoking status: Never    Passive exposure: Never    Smokeless tobacco: Never  Vaping Use   Vaping status: Never Used  Substance and Sexual Activity   Alcohol  use: Never   Drug use: Never   Sexual activity: Yes  Other Topics Concern   Not on file  Social History Narrative   Not on file   Social Drivers of Health   Financial Resource Strain: Low Risk  (12/20/2023)   Overall Financial Resource Strain (CARDIA)    Difficulty of Paying Living Expenses: Not hard at all  Food Insecurity: No Food Insecurity (12/20/2023)   Hunger Vital Sign    Worried About Running Out of Food in the Last Year: Never true    Ran Out of Food in the Last Year: Never true  Transportation Needs: No Transportation Needs (12/20/2023)   PRAPARE - Administrator, Civil Service (Medical): No    Lack of Transportation (Non-Medical): No  Physical Activity: Sufficiently Active (12/20/2023)   Exercise Vital Sign    Days of Exercise per Week: 6 days    Minutes of Exercise per Session: 30 min  Stress: Stress Concern Present (12/20/2023)   [employer] of Occupational Health - Occupational Stress Questionnaire    Feeling of Stress : To some extent  Social Connections: Socially Integrated (12/20/2023)   Social Connection and Isolation Panel    Frequency of Communication with Friends and Family: More than three times a week    Frequency of Social Gatherings with Friends and Family: Patient declined    Attends Religious Services: More than 4 times per year    Active Member of [employer] or Organizations: Yes    Attends Engineer, structural: More than 4 times per year    Marital Status: Married    Tobacco Counseling Counseling given: Not Answered   Clinical Intake:                        Activities of Daily Living    03/14/2024  2:08 PM  In your present state of health, do you have any difficulty performing the following activities:  Hearing? 0  Vision? 0  Difficulty concentrating or making decisions? 1  Comment has some trouble  remembering different things due to a stroke years ago  Walking or climbing stairs? 0  Dressing or bathing? 0  Doing errands, shopping? 1  Preparing Food and eating ? N  Using the Toilet? N  In the past six months, have you accidently leaked urine? Y  Do you have problems with loss of bowel control? N  Managing your Medications? N  Managing your Finances? N  Housekeeping or managing your Housekeeping? N    Patient Care Team: de [place], Quintin PARAS, MD as PCP - General (Family Medicine) Lonni Slain, MD as PCP - Cardiology (Cardiology) Kelsie Agent, MD (Inactive) as Consulting Physician (Cardiology)  Indicate any recent Medical Services you may have received from other than [facility] providers in the past year (date may be approximate).     Assessment:   This is a routine wellness examination for Shannon [name].  Hearing/Vision screen No results found.   Goals Addressed   None    Depression Screen    06/19/2024    8:15 AM 03/14/2024    2:18 PM 12/21/2023   12:58 PM 07/28/2022    9:41 AM 06/30/2022   12:39 PM 04/29/2020   10:02 AM 01/10/2019   10:45 AM  PHQ 2/9 Scores  PHQ - 2 Score 0 0 3 1 0 0 0  PHQ- 9 Score 0  9 6 6       Fall Risk    06/19/2024    8:15 AM 03/14/2024    2:16 PM 12/21/2023   12:58 PM 06/17/2022    9:59 AM 02/07/2019    8:28 AM  Fall Risk   Falls in the past year? 0 0 1 0 0   Number falls in past yr: 0 0 1    Injury with Fall? 0 0 1    Risk for fall due to : No Fall Risks No Fall Risks History of fall(s);Impaired mobility Medication side effect   Follow up Falls evaluation completed Falls evaluation completed Falls evaluation completed;Education provided;Falls prevention discussed Education provided;Falls prevention discussed       Data saved with a previous flowsheet row definition     MEDICARE RISK AT HOME:    TIMED UP AND GO:  Was the test performed?  No    Cognitive Function:        03/14/2024    2:18 PM  6CIT Screen  What Year? 0 points  What  month? 0 points  What time? 0 points  Count back from 20 0 points  Months in reverse 0 points  Repeat phrase 2 points  Total Score 2 points    Immunizations Immunization History  Administered Date(s) Administered   Fluad Quad(high Dose 65+) 09/01/2019, 11/08/2023   Influenza, High Dose Seasonal PF 10/12/2018   PFIZER(Purple Top)SARS-COV-2 Vaccination 01/03/2020, 01/24/2020   Unspecified SARS-COV-2 Vaccination 11/28/2023    TDAP status: Up to date  Flu Vaccine status: Due, Education has been provided regarding the importance of this vaccine. Advised may receive this vaccine at local pharmacy or Health Dept. Aware to provide a copy of the vaccination record if obtained from local pharmacy or Health Dept. Verbalized acceptance and understanding.  Pneumococcal vaccine status: Due, Education has been provided regarding the importance of this vaccine. Advised may receive this vaccine at local pharmacy or Health Dept.  Aware to provide a copy of the vaccination record if obtained from local pharmacy or Health Dept. Verbalized acceptance and understanding.  Covid-19 vaccine status: Completed vaccines  Qualifies for Shingles Vaccine? Yes   Zostavax completed No   Shingrix Completed?: No.    Education has been provided regarding the importance of this vaccine. Patient has been advised to call insurance company to determine out of pocket expense if they have not yet received this vaccine. Advised may also receive vaccine at local pharmacy or Health Dept. Verbalized acceptance and understanding.  Screening Tests Health Maintenance  Topic Date Due   Hepatitis C Screening  Never done   DTaP/Tdap/Td (1 - Tdap) Never done   Pneumococcal Vaccine: 50+ Years (1 of 2 - PCV) Never done   Zoster Vaccines- Shingrix (1 of 2) Never done   DEXA SCAN  Never done   COVID-19 Vaccine (4 - Mixed Product risk 2024-25 season) 05/28/2024   INFLUENZA VACCINE  07/13/2024   Medicare Annual Wellness (AWV)   07/04/2025   Hepatitis B Vaccines  Aged Out   HPV VACCINES  Aged Out   Meningococcal B Vaccine  Aged Out    Health Maintenance  Health Maintenance Due  Topic Date Due   Hepatitis C Screening  Never done   DTaP/Tdap/Td (1 - Tdap) Never done   Pneumococcal Vaccine: 50+ Years (1 of 2 - PCV) Never done   Zoster Vaccines- Shingrix (1 of 2) Never done   DEXA SCAN  Never done   COVID-19 Vaccine (4 - Mixed Product risk 2024-25 season) 05/28/2024    Colorectal cancer screening: No longer required.   Mammogram status: Completed March 25 [facility] . Repeat every year  Bone Density: Order placed  Lung Cancer Screening: (Low Dose CT Chest recommended if Age 70-80 years, 20 pack-year currently smoking OR have quit w/in 15years.) does not qualify.   Lung Cancer Screening Referral: NA  Additional Screening:  Hepatitis C Screening: does not qualify; Patient will discuss this with Dr everitt Peru  Vision Screening: Recommended annual ophthalmology exams for early detection of glaucoma and other disorders of the eye. Is the patient up to date with their annual eye exam?  Yes  Who is the provider or what is the name of the office in which the patient attends annual eye exams? [facility] in [place] If pt is not established with a provider, would they like to be referred to a provider to establish care? No .   Dental Screening: Recommended annual dental exams for proper oral hygiene   Community Resource Referral / Chronic Care Management: CRR required this visit?  No   CCM required this visit?  No     Plan:     I have personally reviewed and noted the following in the patient's chart:   Medical and social history Use of alcohol , tobacco or illicit drugs  Current medications and supplements including opioid prescriptions. Patient is not currently taking opioid prescriptions. Functional ability and status Nutritional status Physical activity Advanced directives List of  other physicians Hospitalizations, surgeries, and ER visits in previous 12 months Vitals Screenings to include cognitive, depression, and falls Referrals and appointments  In addition, I have reviewed and discussed with patient certain preventive protocols, quality metrics, and best practice recommendations. A written personalized care plan for preventive services as well as general preventive health recommendations were provided to patient.     Kerri JONELLE Fuel, CMA   07/04/2024   After Visit Summary: (MyChart) Due to  this being a telephonic visit, the after visit summary with patients personalized plan was offered to patient via MyChart   Nurse Notes:  Ms. Wilensky , Thank you for taking time to come for your Medicare Wellness Visit. I appreciate your ongoing commitment to your health goals. Please review the following plan we discussed and let me know if I can assist you in the future.   These are the goals we discussed:  Goals       general      Patient wants to be able to maintain her quality of life by being able to walk and take care of herself and her husband.      Patient Stated (pt-stated)      Patient would like to be able to breathe better and continue walking and increase if able         This is a list of the screening recommended for you and due dates:  Health Maintenance  Topic Date Due   Hepatitis C Screening  Never done   DTaP/Tdap/Td vaccine (1 - Tdap) Never done   Pneumococcal Vaccine for age over 68 (1 of 2 - PCV) Never done   Zoster (Shingles) Vaccine (1 of 2) Never done   DEXA scan (bone density measurement)  Never done   COVID-19 Vaccine (4 - Mixed Product risk 2024-25 season) 05/28/2024   Flu Shot  07/13/2024   Medicare Annual Wellness Visit  07/04/2025   Hepatitis B Vaccine  Aged Out   HPV Vaccine  Aged Out   Meningitis B Vaccine  Aged Out

## 2024-07-04 NOTE — Patient Instructions (Signed)
 Shannon [name] , Thank you for taking time to come for your Medicare Wellness Visit. I appreciate your ongoing commitment to your health goals. Please review the following plan we discussed and let me know if I can assist you in the future.   Screening recommendations/referrals: Mammogram: completed [facility] Bone Density: ordered for patient  Recommended yearly ophthalmology/optometry visit for glaucoma screening and checkup Recommended yearly dental visit for hygiene and checkup  Vaccinations: Influenza vaccine: due late August Pneumococcal vaccine: will discuss with Dr everitt Peru Tdap vaccine: will discuss with Dr everitt Peru Shingles vaccine: will discuss with Dr everitt Peru    Advanced directives: Patient declined information  Conditions/risks identified: Falls  Next appointment: 1 year   Preventive Care 32 Years and Older, Female Preventive care refers to lifestyle choices and visits with your health care provider that can promote health and wellness. What does preventive care include? A yearly physical exam. This is also called an annual well check. Dental exams once or twice a year. Routine eye exams. Ask your health care provider how often you should have your eyes checked. Personal lifestyle choices, including: Daily care of your teeth and gums. Regular physical activity. Eating a healthy diet. Avoiding tobacco and drug use. Limiting alcohol  use. Practicing safe sex. Taking low-dose aspirin  every day. Taking vitamin and mineral supplements as recommended by your health care provider. What happens during an annual well check? The services and screenings done by your health care provider during your annual well check will depend on your age, overall health, lifestyle risk factors, and family history of disease. Counseling  Your health care provider may ask you questions about your: Alcohol  use. Tobacco use. Drug use. Emotional well-being. Home and relationship  well-being. Sexual activity. Eating habits. History of falls. Memory and ability to understand (cognition). Work and work Astronomer. Reproductive health. Screening  You may have the following tests or measurements: Height, weight, and BMI. Blood pressure. Lipid and cholesterol levels. These may be checked every 5 years, or more frequently if you are over 65 years old. Skin check. Lung cancer screening. You may have this screening every year starting at age 73 if you have a 30-pack-year history of smoking and currently smoke or have quit within the past 15 years. Fecal occult blood test (FOBT) of the stool. You may have this test every year starting at age 53. Flexible sigmoidoscopy or colonoscopy. You may have a sigmoidoscopy every 5 years or a colonoscopy every 10 years starting at age 73. Hepatitis C blood test. Hepatitis B blood test. Sexually transmitted disease (STD) testing. Diabetes screening. This is done by checking your blood sugar (glucose) after you have not eaten for a while (fasting). You may have this done every 1-3 years. Bone density scan. This is done to screen for osteoporosis. You may have this done starting at age 78. Mammogram. This may be done every 1-2 years. Talk to your health care provider about how often you should have regular mammograms. Talk with your health care provider about your test results, treatment options, and if necessary, the need for more tests. Vaccines  Your health care provider may recommend certain vaccines, such as: Influenza vaccine. This is recommended every year. Tetanus, diphtheria, and acellular pertussis (Tdap, Td) vaccine. You may need a Td booster every 10 years. Zoster vaccine. You may need this after age 50. Pneumococcal 13-valent conjugate (PCV13) vaccine. One dose is recommended after age 71. Pneumococcal polysaccharide (PPSV23) vaccine. One dose is recommended after age 56.  Talk to your health care provider about which  screenings and vaccines you need and how often you need them. This information is not intended to replace advice given to you by your health care provider. Make sure you discuss any questions you have with your health care provider. Document Released: 12/26/2015 Document Revised: 08/18/2016 Document Reviewed: 09/30/2015 Elsevier Interactive Patient Education  2017 [employer].  Fall Prevention in the Home Falls can cause injuries. They can happen to people of all ages. There are many things you can do to make your home safe and to help prevent falls. What can I do on the outside of my home? Regularly fix the edges of walkways and driveways and fix any cracks. Remove anything that might make you trip as you walk through a door, such as a raised step or threshold. Trim any bushes or trees on the path to your home. Use bright outdoor lighting. Clear any walking paths of anything that might make someone trip, such as rocks or tools. Regularly check to see if handrails are loose or broken. Make sure that both sides of any steps have handrails. Any raised decks and porches should have guardrails on the edges. Have any leaves, snow, or ice cleared regularly. Use sand or salt on walking paths during winter. Clean up any spills in your garage right away. This includes oil or grease spills. What can I do in the bathroom? Use night lights. Install grab bars by the toilet and in the tub and shower. Do not use towel bars as grab bars. Use non-skid mats or decals in the tub or shower. If you need to sit down in the shower, use a plastic, non-slip stool. Keep the floor dry. Clean up any water that spills on the floor as soon as it happens. Remove soap buildup in the tub or shower regularly. Attach bath mats securely with double-sided non-slip rug tape. Do not have throw rugs and other things on the floor that can make you trip. What can I do in the bedroom? Use night lights. Make sure that you have a  light by your bed that is easy to reach. Do not use any sheets or blankets that are too big for your bed. They should not hang down onto the floor. Have a firm chair that has side arms. You can use this for support while you get dressed. Do not have throw rugs and other things on the floor that can make you trip. What can I do in the kitchen? Clean up any spills right away. Avoid walking on wet floors. Keep items that you use a lot in easy-to-reach places. If you need to reach something above you, use a strong step stool that has a grab bar. Keep electrical cords out of the way. Do not use floor polish or wax that makes floors slippery. If you must use wax, use non-skid floor wax. Do not have throw rugs and other things on the floor that can make you trip. What can I do with my stairs? Do not leave any items on the stairs. Make sure that there are handrails on both sides of the stairs and use them. Fix handrails that are broken or loose. Make sure that handrails are as long as the stairways. Check any carpeting to make sure that it is firmly attached to the stairs. Fix any carpet that is loose or worn. Avoid having throw rugs at the top or bottom of the stairs. If you do have throw  rugs, attach them to the floor with carpet tape. Make sure that you have a light switch at the top of the stairs and the bottom of the stairs. If you do not have them, ask someone to add them for you. What else can I do to help prevent falls? Wear shoes that: Do not have high heels. Have rubber bottoms. Are comfortable and fit you well. Are closed at the toe. Do not wear sandals. If you use a stepladder: Make sure that it is fully opened. Do not climb a closed stepladder. Make sure that both sides of the stepladder are locked into place. Ask someone to hold it for you, if possible. Clearly mark and make sure that you can see: Any grab bars or handrails. First and last steps. Where the edge of each step  is. Use tools that help you move around (mobility aids) if they are needed. These include: Canes. Walkers. Scooters. Crutches. Turn on the lights when you go into a dark area. Replace any light bulbs as soon as they burn out. Set up your furniture so you have a clear path. Avoid moving your furniture around. If any of your floors are uneven, fix them. If there are any pets around you, be aware of where they are. Review your medicines with your doctor. Some medicines can make you feel dizzy. This can increase your chance of falling. Ask your doctor what other things that you can do to help prevent falls. This information is not intended to replace advice given to you by your health care provider. Make sure you discuss any questions you have with your health care provider. Document Released: 09/25/2009 Document Revised: 05/06/2016 Document Reviewed: 01/03/2015 Elsevier Interactive Patient Education  2017 [employer].

## 2024-07-05 ENCOUNTER — Encounter (HOSPITAL_BASED_OUTPATIENT_CLINIC_OR_DEPARTMENT_OTHER)

## 2024-07-11 ENCOUNTER — Ambulatory Visit (INDEPENDENT_AMBULATORY_CARE_PROVIDER_SITE_OTHER): Admitting: Neuroradiology

## 2024-07-11 ENCOUNTER — Encounter: Payer: Self-pay | Admitting: Neuroradiology

## 2024-07-11 VITALS — BP 127/73 | HR 80 | Ht 65.0 in | Wt 119.0 lb

## 2024-07-11 DIAGNOSIS — I6602 Occlusion and stenosis of left middle cerebral artery: Secondary | ICD-10-CM | POA: Diagnosis not present

## 2024-07-11 DIAGNOSIS — I672 Cerebral atherosclerosis: Secondary | ICD-10-CM

## 2024-07-11 NOTE — Progress Notes (Signed)
   Progress Note: Referring Physician:  de [place], Quintin PARAS, MD [address] [place],  [place] [address]  Primary Physician:  de [place], Quintin PARAS, MD  Chief Complaint: Dizziness, occasional word finding difficulty, history of cerebrovascular disease  History of Present Illness: Shannon [name] is a 78 y.o. female who presents with the chief complaint of dizziness, occasional word finding difficulty and history of cerebrovascular disease.  She has had stents placed in the right middle cerebral artery M1 segment twice, the last time December 25, 2018.  She had a left vertebral origin stent placed November 19, 2020.  These were done for relatively nonspecific symptoms.  Her most recent CTA on March 23, 2024, which I reviewed, shows mild restenosis within the right middle cerebral artery stents.  The left vertebral origin stent is patent.  There is no extracranial carotid artery stenosis.  She does have mild fibromuscular dysplasia.  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  She does not have any symptoms of stroke or TIA related to the left middle cerebral artery stenosis.  She describes occasional word finding difficulties, but this is usually isolated to 1 word and not part of a greater speech disturbance.  There are no symptoms of right facial, arm, leg weakness or numbness.  No visual disturbance.  Notably she did have a coronary stent placed in 2023.  She has been on aspirin  and low-dose ticagrelor  since then.   Review of Systems:  Review of Systems  All other systems reviewed and are negative.   Exam: Today's Vitals   07/11/24 1250  BP: 127/73  Pulse: 80  SpO2: 97%  Weight: 119 lb (54 kg)  Height: 5' 5 (1.651 m)   Body mass index is 19.8 kg/m.  I reviewed her medications which include aspirin  81, ticagrelor  30 mg twice daily, and atorvastatin  40 mg daily.  Her blood pressure is well-controlled.  Assessment and Plan:  She has occasional word  finding difficulties which do not sound ischemic, and occasional dizziness which is not likely related to cerebrovascular disease.  I had a lengthy discussion with the patient and her husband about the left middle cerebral artery stenosis and the lack of relationship to her symptoms.  I have explained that while the stenosis could cause a stroke, there is good evidence that patients treated medically actually have a lower risk of stroke then patients treated with stent placement.  I have explained that the only reason to place a stent would be recurrent stroke symptoms despite optimal medical therapy, which she clearly does not have.  In terms of follow-up imaging, I do not think routine follow-up imaging is needed, as no finding on those studies would change our approach unless she develops new symptoms.  We did discuss the most common stroke symptoms, and I have told her that she should call 911 if she develops new stroke symptoms.  I will follow-up with her in the office in a year.

## 2024-07-13 ENCOUNTER — Encounter (HOSPITAL_BASED_OUTPATIENT_CLINIC_OR_DEPARTMENT_OTHER): Payer: Self-pay | Admitting: Family

## 2024-07-13 ENCOUNTER — Ambulatory Visit (HOSPITAL_BASED_OUTPATIENT_CLINIC_OR_DEPARTMENT_OTHER): Admitting: Family

## 2024-07-13 VITALS — BP 124/64 | HR 73 | Ht 65.0 in | Wt 120.1 lb

## 2024-07-13 DIAGNOSIS — R052 Subacute cough: Secondary | ICD-10-CM

## 2024-07-13 DIAGNOSIS — Z8673 Personal history of transient ischemic attack (TIA), and cerebral infarction without residual deficits: Secondary | ICD-10-CM

## 2024-07-13 DIAGNOSIS — I25118 Atherosclerotic heart disease of native coronary artery with other forms of angina pectoris: Secondary | ICD-10-CM

## 2024-07-13 DIAGNOSIS — I471 Supraventricular tachycardia, unspecified: Secondary | ICD-10-CM

## 2024-07-13 DIAGNOSIS — E785 Hyperlipidemia, unspecified: Secondary | ICD-10-CM

## 2024-07-13 MED ORDER — BENZONATATE 100 MG PO CAPS: 100.0000 mg | ORAL_CAPSULE | Freq: Three times a day (TID) | ORAL | 0 refills | Status: AC | PRN

## 2024-07-13 NOTE — Patient Instructions (Signed)
 Medication Instructions:  START Tessalon  100mg  as needed for cough (try before bed at night)  *If you need a refill on your cardiac medications before your next appointment, please call your pharmacy*  Follow-Up: At [facility], you and your health needs are our priority.  As part of our continuing mission to provide you with exceptional heart care, our providers are all part of one team.  This team includes your primary Cardiologist (physician) and Advanced Practice Providers or APPs (Physician Assistants and Nurse Practitioners) who all work together to provide you with the care you need, when you need it.  Your next appointment:   You have been referred to EP (electrophysiology) to be seen in September 6 month(s) with Dr. Lonni  We recommend signing up for the patient portal called MyChart.  Sign up information is provided on this After Visit Summary.  MyChart is used to connect with patients for Virtual Visits (Telemedicine).  Patients are able to view lab/test results, encounter notes, upcoming appointments, etc.  Non-urgent messages can be sent to your provider as well.   To learn more about what you can do with MyChart, go to [email or web address].   Other Instructions  Heart Healthy Diet Recommendations: A low-salt diet is recommended. Meats should be grilled, baked, or boiled. Avoid fried foods. Focus on lean protein sources like fish or chicken with vegetables and fruits. The American Heart Association is a [employer]!  American Heart Association Diet and Lifeystyle Recommendations   Exercise recommendations: The American Heart Association recommends 150 minutes of moderate intensity exercise weekly. Try 30 minutes of moderate intensity exercise 4-5 times per week. This could include walking, jogging, or swimming.

## 2024-07-13 NOTE — Progress Notes (Signed)
 Cardiology Office Note:  .   Date:  07/13/2024  ID:  Shannon [name], Shannon [name] [birth year], MRN [id number] PCP: de [place], Raymond J, MD  [facility] Providers Cardiologist:  Shelda Bruckner, MD    History of Present Illness: .   Shannon [name] is a 78 y.o. female  with a hx of hypertension, CVA, CAD.   Prior cardiac cath (date unknown) with reported 30% RCA lesion. Prior echo 2019 with normal LVEF, negative bubble study. Previous ETT 02/26/21 with fair exercise tolerance, 1mm up-sloping to horizontal ST depressions in inferior lateral leads of borderline significance.  Due to chest pain cardiac CTA performed 12/03/2021 with coronary calcium  score 150 placing her in the 67th percentile for age and sex matched control.  She had noncalcified plaques in the proximal RCA with moderate 50 to 69% stenosis, calcified plaque in the proximal RCA, LAD, proximal LCx with 0 to 24% stenosis.  Noted PFO.  FFR on RCA of 0.8 suggesting borderline likelihood of hemodynamic significance.  She was started on Imdur  but did not tolerate.  Previously did not tolerate increased doses of metoprolol .   At follow up 05/20/22 noted exertional dyspnea and chest pain. Cardiac cath ordered and performed with PCI/DES to prox RCA, nonobstructive disease (dist LAD, mid-dist Cx 10%). Discharged on Brilinta  90mg  BID for at least 6 months though 3 months could be considered if bleeding issues.     Myoview  08/24/22 low risk study, no evidence of ischemia nor infarction, EF 68%. Echo 03/06/24 LVEF 65-70%, gr1dd, mild LVH, no significant valvular abnormalities.   04/05/24 CTA head and neck with stable right sided stented middle cerebral artery. Left middle cerebral artery M1 segment with decreased caliber proximally as well as decreased caliber of one of the bifurcation branches of left middle cerebral artery. MRI brain ordered, CTA heead and neck recommended for 6 mos. MR 04/30/24 no acut4e intracranial finding, unchanged chronic infarcts,  several nonspecific chronic microhemorrhages stable from previous.   Due to persistent dizziness, repeat ZIO monitor while off beta blocker ordered. Monitor processing data as of 06/11/24, not yet available for review.   ED visit 06/17/24 with exertional dyspnea as well as intermittent lower chest pain and lightheadedness. Troponin negative x2. EKG nonacute. D-Dimer elevated with CTA without PE nor pneumonia.  On follow-up 06/19/2024 due to persistent exertional dyspnea and chest discomfort LHC ordered.  LHC 06/27/2024 patent RCA stent with distal LAD, mid circumflex 10% stenosed which was stable.  Presents today for follow-up independently.  Chief concern is memory as she notes her medications were not in her pillbox this morning but she has not certain when she took them.  SBP controlled we discussed to wait until tomorrow to take her next dose of lisinopril .  She notes sinus drainage and cough, encouraged to discuss with primary care.  We discussed that her productive cough will be atypical for lisinopril  but if persistent could consider transition to alternate agent.  No chest pain since heart cath.  Her exertional dyspnea is improved since moving her daily walk to the morning when it is less hot/humid.  She is on neurosurgery 07/11/2024 for follow-up regarding left middle cerebral artery stenosis not felt to be contributory to any symptoms.  She reports occasional lightheadedness and dizziness however this is overall intermittent and has improved since LHC. No near syncope, syncope. Reviewed preliminary monitor report with 439 episodes of SVT/AT denies palpitations and triggered episodes of junctional rhythm.   ROS: Please see the history of present illness.  All other systems reviewed and are negative.   Studies Reviewed: .          Cardiac Studies & Procedures   ______________________________________________________________________________________________ CARDIAC CATHETERIZATION  CARDIAC  CATHETERIZATION 06/27/2024  Conclusion   Dist LAD lesion is 10% stenosed.   Mid Cx to Dist Cx lesion is 10% stenosed.   Previously placed Prox RCA stent of unknown type is  widely patent.  Patent RCA stent Very mild non-obstructive plaque in the LAD and Circumflex Normal LV filling pressure (LVEDP=10 mmHg)  Recommendations: Continue medical management of CAD  Findings Coronary Findings Diagnostic  Dominance: Right  Left Anterior Descending Dist LAD lesion is 10% stenosed.  Left Circumflex Mid Cx to Dist Cx lesion is 10% stenosed.  Right Coronary Artery Previously placed Prox RCA stent of unknown type is  widely patent.  Intervention  No interventions have been documented.   CARDIAC CATHETERIZATION  CARDIAC CATHETERIZATION 05/31/2022  Conclusion   Prox RCA lesion is 75% stenosed.  A drug-eluting stent was successfully placed using a SYNERGY XD 3.0X16, postdilated to 3.25 mm and optimized with IVUS.   Post intervention, there is a 0% residual stenosis.   Dist LAD lesion is 10% stenosed.   Mid Cx to Dist Cx lesion is 10% stenosed.   The left ventricular systolic function is normal.   LV end diastolic pressure is normal.   The left ventricular ejection fraction is 55-65% by visual estimate.   There is no aortic valve stenosis.  Successful PCI of the proximal RCA.  Patient was on a very low-dose of Brilinta  in the past.  Apparently, she was a Plavix  nonresponder.  She will need to go back to Brilinta  90 mg twice a day for 6 months, ideally.  Could consider 3 months if there were bleeding issues.  After that time, her Brilinta  could be reduced.  Will CC Dr. Dolphus.  Continue aggressive secondary prevention.  Findings Coronary Findings Diagnostic  Dominance: Right  Left Anterior Descending Dist LAD lesion is 10% stenosed.  Left Circumflex Mid Cx to Dist Cx lesion is 10% stenosed.  Right Coronary Artery Prox RCA lesion is 75% stenosed. Ultrasound (IVUS) was  performed. Cross-sectional area: 2.52 mm.  Severe plaque burden was detected.  Intervention  Prox RCA lesion Stent CATH LAUNCHER 6FR JR4 guide catheter was inserted. Lesion crossed with guidewire using a WIRE ASAHI PROWATER 180CM. Pre-stent angioplasty was not performed. A drug-eluting stent was successfully placed using a SYNERGY XD 3.0X16. Stent strut is well apposed. Post-stent angioplasty was performed using a BALL SAPPHIRE NC24 3.25X12. Post-Intervention Lesion Assessment The intervention was successful. Pre-interventional TIMI flow is 3. Post-intervention TIMI flow is 3. No complications occurred at this lesion. Ultrasound (IVUS) was performed on the lesion post PCI using a CATH OPTICROSS HD. Stent well apposed. There is a 0% residual stenosis post intervention.   STRESS TESTS  MYOCARDIAL PERFUSION IMAGING 09/02/2022  Interpretation Summary   The study is normal. The study is low risk based on normal perfusion.   Severely elevated pre-test BP of 201/92 mmHg.   LV perfusion is normal. There is no evidence of ischemia. There is no evidence of infarction.   Left ventricular function is normal. Nuclear stress EF: 68 %. The left ventricular ejection fraction is hyperdynamic (>65%). End diastolic cavity size is normal. End systolic cavity size is normal.   Prior study not available for comparison.   ECHOCARDIOGRAM  ECHOCARDIOGRAM COMPLETE 03/02/2024  Narrative ECHOCARDIOGRAM REPORT    Patient Name:   Shannon [name]  Shannon [name] Date of Exam: 03/02/2024 Medical Rec #:  [id number]     Height:       65.0 in Accession #:    [id number]    Weight:       118.8 lb Date of Birth:  [birth year]     BSA:          1.585 m Patient Age:    78 years      BP:           182/80 mmHg Patient Gender: F             HR:           74 bpm. Exam Location:  Outpatient  Procedure: 2D Echo, 3D Echo, Cardiac Doppler, Color Doppler and Strain Analysis (Both Spectral and Color Flow Doppler were utilized  during procedure).  Indications:    R07.2 Precordial pain; R06.9 DOE; R60.0 Lower extremity edema; R42 Lightheaded  History:        Patient has prior history of Echocardiogram examinations, most recent 12/09/2018. Stroke and TIA, Signs/Symptoms:Dizziness/Lightheadedness, Chest Pain, Dyspnea and Edema; Risk Factors:Hypertension and Non-Smoker. Patient complains of chest pain, DOE, leg edema and dizziness.  Sonographer:    Shannon [name] RVT, RDCS (AE), RDMS Referring Phys: [phone] Lianna Sitzmann S Lyzette Reinhardt  IMPRESSIONS   1. Left ventricular ejection fraction, by estimation, is 65 to 70%. Left ventricular ejection fraction by 3D volume is 60 %. The left ventricle has normal function. The left ventricle has no regional wall motion abnormalities. There is mild left ventricular hypertrophy. Left ventricular diastolic parameters are consistent with Grade I diastolic dysfunction (impaired relaxation). 2. Right ventricular systolic function is normal. The right ventricular size is normal. Tricuspid regurgitation signal is inadequate for assessing PA pressure. 3. Left atrial size was mild to moderately dilated. 4. The mitral valve is grossly normal. Trivial mitral valve regurgitation. No evidence of mitral stenosis. 5. The aortic valve is tricuspid. There is mild calcification of the aortic valve. Aortic valve regurgitation is trivial. No aortic stenosis is present. 6. The inferior vena cava is normal in size with greater than 50% respiratory variability, suggesting right atrial pressure of 3 mmHg.  FINDINGS Left Ventricle: Left ventricular ejection fraction, by estimation, is 65 to 70%. Left ventricular ejection fraction by 3D volume is 60 %. The left ventricle has normal function. The left ventricle has no regional wall motion abnormalities. Strain was performed and the global longitudinal strain is indeterminate. The left ventricular internal cavity size was normal in size. There is mild left ventricular  hypertrophy. Left ventricular diastolic parameters are consistent with Grade I diastolic dysfunction (impaired relaxation).  Right Ventricle: The right ventricular size is normal. No increase in right ventricular wall thickness. Right ventricular systolic function is normal. Tricuspid regurgitation signal is inadequate for assessing PA pressure.  Left Atrium: Left atrial size was mild to moderately dilated.  Right Atrium: Right atrial size was normal in size.  Pericardium: There is no evidence of pericardial effusion.  Mitral Valve: The mitral valve is grossly normal. Trivial mitral valve regurgitation. No evidence of mitral valve stenosis.  Tricuspid Valve: The tricuspid valve is normal in structure. Tricuspid valve regurgitation is trivial. No evidence of tricuspid stenosis.  Aortic Valve: The aortic valve is tricuspid. There is mild calcification of the aortic valve. Aortic valve regurgitation is trivial. Aortic regurgitation PHT measures 582 msec. No aortic stenosis is present. Aortic valve mean gradient measures 2.0 mmHg. Aortic valve peak gradient measures 4.8 mmHg. Aortic valve area, by VTI  measures 2.40 cm.  Pulmonic Valve: The pulmonic valve was normal in structure. Pulmonic valve regurgitation is trivial. No evidence of pulmonic stenosis.  Aorta: The aortic root is normal in size and structure.  Venous: The inferior vena cava is normal in size with greater than 50% respiratory variability, suggesting right atrial pressure of 3 mmHg.  IAS/Shunts: No atrial level shunt detected by color flow Doppler.  Additional Comments: 3D was performed not requiring image post processing on an independent workstation and was normal.   LEFT VENTRICLE PLAX 2D LVIDd:         3.48 cm         Diastology LVIDs:         2.24 cm         LV e' medial:    3.60 cm/s LV PW:         0.98 cm         LV E/e' medial:  16.6 LV IVS:        1.27 cm         LV e' lateral:   5.29 cm/s LVOT diam:     2.10 cm          LV E/e' lateral: 11.3 LV SV:         58 LV SV Index:   36 LVOT Area:     3.46 cm        3D Volume EF LV 3D EF:    Left ventricul ar ejection fraction by 3D volume is 60 %.  3D Volume EF: 3D EF:        60 % LV EDV:       74 ml LV ESV:       30 ml LV SV:        44 ml  RIGHT VENTRICLE RV S prime:     12.70 cm/s TAPSE (M-mode): 1.9 cm  LEFT ATRIUM             Index        RIGHT ATRIUM           Index LA diam:        4.40 cm 2.78 cm/m   RA Area:     10.10 cm LA Vol (A2C):   66.0 ml 41.63 ml/m  RA Volume:   20.60 ml  12.99 ml/m LA Vol (A4C):   62.2 ml 39.24 ml/m LA Biplane Vol: 68.1 ml 42.96 ml/m AORTIC VALVE                    PULMONIC VALVE AV Area (Vmax):    2.69 cm     PV Vmax:          0.86 m/s AV Area (Vmean):   2.58 cm     PV Peak grad:     2.9 mmHg AV Area (VTI):     2.40 cm     PR End Diast Vel: 1.35 msec AV Vmax:           109.00 cm/s AV Vmean:          70.700 cm/s AV VTI:            0.241 m AV Peak Grad:      4.8 mmHg AV Mean Grad:      2.0 mmHg LVOT Vmax:         84.60 cm/s LVOT Vmean:        52.700 cm/s LVOT VTI:  0.167 m LVOT/AV VTI ratio: 0.69 AI PHT:            582 msec AR Vena Contracta: 0.23 cm  AORTA Ao Root diam: 3.10 cm Ao Asc diam:  3.60 cm Ao Arch diam: 3.3 cm  MITRAL VALVE MV Area (PHT): 2.71 cm    SHUNTS MV Decel Time: 280 msec    Systemic VTI:  0.17 m MV E velocity: 59.90 cm/s  Systemic Diam: 2.10 cm MV A velocity: 97.70 cm/s MV E/A ratio:  0.61  Shannon Merck MD Electronically signed by Shannon Merck MD Signature Date/Time: 03/03/2024/1:16:40 PM    Final    MONITORS  LONG TERM MONITOR (3-14 DAYS) 03/02/2024  Narrative Patch Wear Time:  13 days and 19 hours (2025-02-24T10:29:33-0500 to 2025-03-10T06:30:40-0400)  Patient had a min HR of 33 bpm, max HR of 141 bpm, and avg HR of 60 bpm. Predominant underlying rhythm was Sinus Rhythm. 7 Supraventricular Tachycardia runs occurred, the run with the  fastest interval lasting 5 beats with a max rate of 141 bpm, the longest lasting 5 beats with an avg rate of 103 bpm. 3 Pauses occurred, the longest lasting 3.7 secs (16 bpm). Junctional Rhythm was present. Junctional Rhythm was detected within +/- 45 seconds of symptomatic patient event(s). Isolated atrial and ventricular ectopy was rare (<1%). There were 27 triggered events; the majority of these were sinus bradycardia or sinus rhythm, but threewere in proximity to brief episodes of junctional rhythm. Previous notification for symptomatic bradycardia was communicated previously.   CT SCANS  CT CORONARY FRACTIONAL FLOW RESERVE DATA PREP 12/03/2021  Narrative EXAM: FFRCT ANALYSIS  FINDINGS: FFRct analysis was performed on the original cardiac CT angiogram dataset. Diagrammatic representation of the FFRct analysis is provided in a separate PDF document in PACS. This dictation was created using the PDF document and an interactive 3D model of the results. 3D model is not available in the EMR/PACS. Normal FFR range is >0.80.  1. Left Main: No significant lesions  2. LAD: No significant lesions  3. LCX:No significant lesions  4.    RCA: CT FFR 0.8 across lesion in proximal RCA  IMPRESSION: 1. CT FFR 0.8 across lesion in proximal RCA, which suggests borderline likelihood of hemodynamic significance   Electronically Signed By: Lonni Nanas M.D. On: 12/03/2021 23:47   CT SCANS  CT CORONARY MORPH W/CTA COR W/SCORE 12/03/2021  Addendum 12/03/2021  4:43 PM ADDENDUM REPORT: 12/03/2021 16:40  CLINICAL DATA:  79F with chest pain  EXAM: Cardiac/Coronary CTA  TECHNIQUE: The patient was scanned on a [employer].  FINDINGS: A 100 kV prospective scan was triggered in the descending thoracic aorta at 111 HU's. Axial non-contrast 3 mm slices were carried out through the heart. The data set was analyzed on a dedicated work station and scored using the Agatson  method. Gantry rotation speed was 250 msecs and collimation was .6 mm. 0.8 mg of sl NTG was given. The 3D data set was reconstructed in 5% intervals of the 35-75% of the R-R cycle. Phases were analyzed on a dedicated work station using MPR, MIP and VRT modes. The patient received 80 cc of contrast.  Coronary Arteries:  Normal coronary origin.  Right dominance.  RCA is a large dominant artery that gives rise to PDA and PLA. There is noncalcified plaque in the proximal RCA causing 50-69% stenosis. Calcified plaque in the proximal RCA causes 0-24% stenosis  Left main is a large artery that gives rise to LAD and LCX arteries.  LAD is a large vessel. There is calcified plaque in the proximal LAD causing 0-24% stenosis  LCX is a non-dominant artery that gives rise to one large OM1 branch. There is calcified plaque in the proximal LCX causing 0-24% stenosis  Other findings:  Left Ventricle: Normal size  Left Atrium: Mild enlargement. PFO  Pulmonary Veins: Normal configuration  Right Ventricle: Normal size  Right Atrium: Normal size  Cardiac valves: No calcifications  Thoracic aorta: Normal size  Pulmonary Arteries: Normal size  Systemic Veins: Normal drainage  Pericardium: Normal thickness  IMPRESSION: 1. Coronary calcium  score of 150. This was 67th percentile for age and sex matched control.  2.  Normal coronary origin with right dominance.  3. Noncalcified plaque in the proximal RCA causes moderate (50-69%) stenosis  4. Calcified plaque in the proximal RCA, proximal LAD, and proximal LCX causes minimal (0-24%) stenosis  5.  PFO  6.  Will send for CT FFR  CAD-RADS 3. Moderate stenosis. Consider symptom-guided anti-ischemic pharmacotherapy as well as risk factor modification per guideline directed care. Additional analysis with CT FFR will be submitted.   Electronically Signed By: Lonni Nanas M.D. On: 12/03/2021  16:40  Narrative EXAM: OVER-READ INTERPRETATION  CT CHEST  The following report is an over-read performed by radiologist Dr. Franky [name] of [facility] on 12/03/2021. This over-read does not include interpretation of cardiac or coronary anatomy or pathology. The coronary CTA interpretation by the cardiologist is attached.  COMPARISON:  None.  FINDINGS: Vascular: Heart is normal size. Aorta normal caliber. Scattered calcifications in the descending thoracic aorta.  Mediastinum/Nodes: Calcified mediastinal and right hilar lymph nodes. No adenopathy  Lungs/Pleura: Calcified granuloma in the right lower lobe. No confluent airspace opacities or effusions.  Upper Abdomen: Imaging into the upper abdomen demonstrates no acute findings. Calcification in the liver compatible with old granuloma.  Musculoskeletal: Chest wall soft tissues are unremarkable. No acute bony abnormality.  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  Aortic atherosclerosis.  No acute extra cardiac abnormality.  Electronically Signed: By: Shannon [name] M.D. On: 12/03/2021 12:53     ______________________________________________________________________________________________      Risk Assessment/Calculations:            Physical Exam:   VS:  BP (!) 144/70   Pulse 73   Ht 5' 5 (1.651 m)   Wt 120 lb 1.6 oz (54.5 kg)   SpO2 98%   BMI 19.99 kg/m    Wt Readings from Last 3 Encounters:  07/13/24 120 lb 1.6 oz (54.5 kg)  07/11/24 119 lb (54 kg)  06/27/24 118 lb 4.8 oz (53.7 kg)    Vitals:   07/13/24 0952 07/13/24 1019  BP: (!) 144/70 124/64  Pulse: 73   Height: 5' 5 (1.651 m)   Weight: 120 lb 1.6 oz (54.5 kg)   SpO2: 98%   BMI (Calculated): 19.99     Vitals:   07/13/24 0952  BP: (!) 144/70  Pulse: 73  Height: 5' 5 (1.651 m)  Weight: 120 lb 1.6 oz (54.5 kg)  SpO2: 98%  BMI (Calculated): 19.99     GEN: Well nourished, well developed in no acute distress,  anxious NECK: No JVD; No carotid bruits CARDIAC: RRR, no murmurs, rubs, gallops RESPIRATORY:  Clear to auscultation without rales, wheezing or rhonchi  ABDOMEN: Soft, non-tender, non-distended EXTREMITIES:  No edema; No deformity   ASSESSMENT AND PLAN: .     Coronary artery disease / HLD, LDL goal <55 - PCI/DES RCA 05/31/22. 08/2022  low risk myoview . 06/2024 LHC patent RCA stent. Continue aspirin  81 mg, Atorvastatin  40mg  daily.  BB previously stopped due to bradycardia. 06/12/24 LDL 55.   Palpitations / Tachycardia / Bradycardia /  Dizziness- Beta blocker (metoprolol ) previously discontinued due to bradycardia, sinus pause. Echo 03/02/24 normal LVEF 65-70%, gr1dd, no significant valvular abnormalities. Monitor 05/2024 after discontinuation of Metoprolol  to evaluate for recurrent dizziness in processing, with junctional rhythm noted w/in 45 seconds as well as asymptomatic episodes of SVT. Will refer to EP due to intermittent dizziness to ensure no further intervention warranted. Schedule for September per her travel plans. SABRA    PFO -incidental finding on CT.  Negative bubble study on prior echo.  History of stroke but was due to ischemia/stenosis. No indication for closure.    HTN / Bessey coat hypertension- BP well controlled. Continue current antihypertensive regimen.    Hypothyroidism - Managed by primary care.   History of CVA - On Aspirin  81 mg and Brilinta  60 mg per interventional neurology. Follows with neurology, IR.   Middle cerebral artery stent - followed with Dr. Dolphus of inteventional radiology who is leaving the practice. Has since established with neurosurgery.   Subacute cough - Rx tessalon  100mg  PRN. Further follow up per PCP.         Dispo: follow up in 6 mos  Signed, Reche GORMAN Finder, NP

## 2024-08-16 ENCOUNTER — Ambulatory Visit (HOSPITAL_BASED_OUTPATIENT_CLINIC_OR_DEPARTMENT_OTHER): Payer: Self-pay | Admitting: Family

## 2024-08-16 DIAGNOSIS — I471 Supraventricular tachycardia, unspecified: Secondary | ICD-10-CM | POA: Diagnosis not present

## 2024-08-16 DIAGNOSIS — R001 Bradycardia, unspecified: Secondary | ICD-10-CM | POA: Diagnosis not present

## 2024-08-17 ENCOUNTER — Encounter (HOSPITAL_BASED_OUTPATIENT_CLINIC_OR_DEPARTMENT_OTHER): Payer: Self-pay | Admitting: Family Medicine

## 2024-08-20 ENCOUNTER — Other Ambulatory Visit (HOSPITAL_BASED_OUTPATIENT_CLINIC_OR_DEPARTMENT_OTHER): Payer: Self-pay | Admitting: *Deleted

## 2024-08-20 DIAGNOSIS — Z780 Asymptomatic menopausal state: Secondary | ICD-10-CM

## 2024-08-28 NOTE — Progress Notes (Unsigned)
 Electrophysiology Office Note:    Date:  08/29/2024   ID:  Shannon [name], Shannon [name], Shannon [name], Shannon [name]  [facility] Cardiologist:  Shannon Bruckner, MD  [facility] Electrophysiologist:  None   Referring MD: Vannie Reche RAMAN, NP   Chief Complaint: Abnormal heart monitor  History of Present Illness:    Shannon [name] is a 78 year old woman who I am seeing today for an evaluation of an abnormal heart monitor.  She has a history of hypertension, stroke and coronary artery disease.  The patient was seen in April of this year with persistent dizziness.  A ZIO monitor was ordered.  Of note, she was not taking a beta-blocker at that time because of dizziness.  She saw Reche Vannie in clinic July 13, 2024.  They reviewed a heart monitor that showed brief episodes of SVT.  Today he reports intermittent dizziness and lightheadedness.  These do not reliably correlate to bradycardia or hypotension.  They can occur with rates in the upper 50s.  They can occur with systolic blood pressures around 100 to 110 mmHg.  She will intermittently hold her antihypertensive medications if her systolic blood pressure is less than 110.    Their past medical, social and family history was reviewed.   ROS:   Please see the history of present illness.    All other systems reviewed and are negative.  EKGs/Labs/Other Studies Reviewed:    The following studies were reviewed today:  August 16, 2024 ZIO monitor personally reviewed Heart rate 37-1 46, average 74 439 nonsustained SVT, longest 9 beats Intermittent junctional rhythm No pauses No high degree AV block  EKG Interpretation Date/Time:  Wednesday August 29 2024 13:32:12 EDT Ventricular Rate:  75 PR Interval:  176 QRS Duration:  82 QT Interval:  390 QTC Calculation: 435 R Axis:   -4  Text Interpretation: Sinus rhythm with marked sinus arrhythmia When compared with ECG of 19-Jun-2024 09:29, No significant change was found Confirmed  by Cindie Smalls [phone]) on 08/29/2024 2:05:54 PM    Physical Exam:    VS:  BP (!) 140/68 (BP Location: Left Arm, Patient Position: Sitting, Cuff Size: Normal)   Pulse 75   Ht 5' 5 (1.651 m)   Wt 118 lb 8 oz (53.8 kg)   SpO2 99%   BMI 19.72 kg/m     Wt Readings from Last 3 Encounters:  08/29/24 118 lb 8 oz (53.8 kg)  07/13/24 120 lb 1.6 oz (54.5 kg)  07/11/24 119 lb (54 kg)     GEN: no distress CARD: RRR, No MRG RESP: No IWOB. CTAB.        ASSESSMENT AND PLAN:    1. Coronary artery disease of native artery of native heart with stable angina pectoris (HCC)   2. SVT (supraventricular tachycardia) (HCC)   3. SOB (shortness of breath)   4. Chest heaviness     #Palpitations #SVT The patient has nonsustained episodes of SVT on her heart monitor consistent with atrial tachycardia.  I do not think any additional workup is needed for this.  I do not think these are contributing to her episodes of lightheadedness and dizziness.  #Lightheadedness and dizziness I suspect these episodes are related to orthostasis and possible variations in her blood pressures.  They do not seem to reliably correspond to heart rate fluctuations.  No evidence of severe bradycardia on her recent heart monitor.  No additional workup is needed from an EP perspective.  I have encouraged her to stay adequately hydrated.  She should take changes in position slowly to avoid significant orthostatic hypotension.  Follow-up with EP on an as-needed basis      Signed, Ole DASEN. Cindie, MD, [facility], [facility] 08/29/2024 2:18 PM    Electrophysiology [facility]

## 2024-08-29 ENCOUNTER — Ambulatory Visit: Attending: Cardiology | Admitting: Cardiology

## 2024-08-29 ENCOUNTER — Encounter (HOSPITAL_BASED_OUTPATIENT_CLINIC_OR_DEPARTMENT_OTHER): Payer: Self-pay | Admitting: Family Medicine

## 2024-08-29 ENCOUNTER — Encounter: Payer: Self-pay | Admitting: Cardiology

## 2024-08-29 ENCOUNTER — Ambulatory Visit (HOSPITAL_BASED_OUTPATIENT_CLINIC_OR_DEPARTMENT_OTHER): Admitting: Family Medicine

## 2024-08-29 ENCOUNTER — Other Ambulatory Visit (HOSPITAL_BASED_OUTPATIENT_CLINIC_OR_DEPARTMENT_OTHER): Payer: Self-pay

## 2024-08-29 VITALS — BP 140/68 | HR 75 | Ht 65.0 in | Wt 118.5 lb

## 2024-08-29 DIAGNOSIS — R0602 Shortness of breath: Secondary | ICD-10-CM | POA: Diagnosis not present

## 2024-08-29 DIAGNOSIS — I471 Supraventricular tachycardia, unspecified: Secondary | ICD-10-CM | POA: Diagnosis not present

## 2024-08-29 DIAGNOSIS — R0789 Other chest pain: Secondary | ICD-10-CM | POA: Insufficient documentation

## 2024-08-29 DIAGNOSIS — I25118 Atherosclerotic heart disease of native coronary artery with other forms of angina pectoris: Secondary | ICD-10-CM | POA: Diagnosis not present

## 2024-08-29 MED ORDER — COMIRNATY 30 MCG/0.3ML IM SUSY
0.3000 mL | PREFILLED_SYRINGE | Freq: Once | INTRAMUSCULAR | 0 refills | Status: AC
Start: 2024-08-29 — End: 2024-08-30
  Filled 2024-08-29: qty 0.3, 1d supply, fill #0

## 2024-08-29 NOTE — Patient Instructions (Signed)
 Medication Instructions:  Your physician recommends that you continue on your current medications as directed. Please refer to the Current Medication list given to you today.  *If you need a refill on your cardiac medications before your next appointment, please call your pharmacy*  Follow-Up: At [facility], you and your health needs are our priority.  As part of our continuing mission to provide you with exceptional heart care, our providers are all part of one team.  This team includes your primary Cardiologist (physician) and Advanced Practice Providers or APPs (Physician Assistants and Nurse Practitioners) who all work together to provide you with the care you need, when you need it.  Your next appointment:   As needed with Dr. Cindie

## 2024-10-16 ENCOUNTER — Encounter (HOSPITAL_BASED_OUTPATIENT_CLINIC_OR_DEPARTMENT_OTHER): Payer: Self-pay | Admitting: Family Medicine

## 2024-10-16 ENCOUNTER — Ambulatory Visit (INDEPENDENT_AMBULATORY_CARE_PROVIDER_SITE_OTHER): Admitting: Family Medicine

## 2024-10-16 VITALS — BP 197/64 | HR 68 | Ht 65.0 in | Wt 119.8 lb

## 2024-10-16 DIAGNOSIS — R0981 Nasal congestion: Secondary | ICD-10-CM | POA: Insufficient documentation

## 2024-10-16 DIAGNOSIS — I10 Essential (primary) hypertension: Secondary | ICD-10-CM | POA: Diagnosis not present

## 2024-10-16 MED ORDER — ESCITALOPRAM OXALATE 5 MG PO TABS: 5.0000 mg | ORAL_TABLET | Freq: Every day | ORAL | 1 refills | Status: AC

## 2024-10-16 NOTE — Progress Notes (Signed)
    Procedures performed today:    None.  Independent interpretation of notes and tests performed by another provider:   None.  Brief History, Exam, Impression, and Recommendations:    BP (!) 177/72   Pulse 68   Ht 5' 5 (1.651 m)   Wt 119 lb 12.8 oz (54.3 kg)   SpO2 99%   BMI 19.94 kg/m   Sinus congestion Assessment & Plan: Patient reports that she has had ongoing issues with sinus congestion, occasional cough.  Given her history, she has been advised on avoiding OTC decongestants.  She did take her medication in the past that contains pseudoephedrine and had side effects.  She has been utilizing Tessalon  Perles to help with cough at times.  She was also utilizing Delsym.  She does take Claritin  in regards to an oral antihistamine. We discussed considerations, ultimately I do feel that she would be best served by avoiding decongestant.  We discussed additional measures to utilize to help with ongoing sinus congestion, cough.  Would recommend utilizing nasal saline spray, Flonase/Nasacort/azelastine as preferred.  Also wonder if Claritin  is not as beneficial for her presently and if she may find more improvement with switching to Zyrtec or Allegra, discussed today.  Can continue with benzonatate  as needed to help with cough   Primary hypertension Assessment & Plan: Blood pressure elevated on initial reading in office today; did improve on recheck.  Recommend continuing with current medication regimen given prior appropriate control, no changes to be made today.  Recommend continued follow-up with cardiology as scheduled   Other orders -     Escitalopram Oxalate; Take 1 tablet (5 mg total) by mouth daily.  Dispense: 90 tablet; Refill: 1  Return in about 4 months (around 02/13/2025).   ___________________________________________ Inocente Krach de [place], MD, ABFM, CAQSM Primary Care and Sports Medicine [facility]

## 2024-10-16 NOTE — Assessment & Plan Note (Signed)
 Patient reports that she has had ongoing issues with sinus congestion, occasional cough.  Given her history, she has been advised on avoiding OTC decongestants.  She did take her medication in the past that contains pseudoephedrine and had side effects.  She has been utilizing Tessalon  Perles to help with cough at times.  She was also utilizing Delsym.  She does take Claritin  in regards to an oral antihistamine. We discussed considerations, ultimately I do feel that she would be best served by avoiding decongestant.  We discussed additional measures to utilize to help with ongoing sinus congestion, cough.  Would recommend utilizing nasal saline spray, Flonase/Nasacort/azelastine as preferred.  Also wonder if Claritin  is not as beneficial for her presently and if she may find more improvement with switching to Zyrtec or Allegra, discussed today.  Can continue with benzonatate  as needed to help with cough

## 2024-10-16 NOTE — Assessment & Plan Note (Signed)
 Blood pressure elevated on initial reading in office today; did improve on recheck.  Recommend continuing with current medication regimen given prior appropriate control, no changes to be made today.  Recommend continued follow-up with cardiology as scheduled

## 2024-10-16 NOTE — Patient Instructions (Signed)
  Medication Instructions:  Your physician recommends that you continue on your current medications as directed. Please refer to the Current Medication list given to you today. --If you need a refill on any your medications before your next appointment, please call your pharmacy first. If no refills are authorized on file call the office.-- Lab Work: Your physician has recommended that you have lab work today: none If you have labs (blood work) drawn today and your tests are completely normal, you will receive your results via MyChart message OR a phone call from our staff.  Please ensure you check your voicemail in the event that you authorized detailed messages to be left on a delegated number. If you have any lab test that is abnormal or we need to change your treatment, we will call you to review the results.  Referrals/Procedures/Imaging: none   Follow-Up: Your next appointment:   Your physician recommends that you schedule a follow-up appointment in: 4 months with Dr. de [place]  You will receive a text message or e-mail with a link to a survey about your care and experience with us  today! We would greatly appreciate your feedback!   Thanks for letting us  be apart of your health journey!!  Primary Care and Sports Medicine   Dr. Quintin sheerer [place]   We encourage you to activate your patient portal called MyChart.  Sign up information is provided on this After Visit Summary.  MyChart is used to connect with patients for Virtual Visits (Telemedicine).  Patients are able to view lab/test results, encounter notes, upcoming appointments, etc.  Non-urgent messages can be sent to your provider as well. To learn more about what you can do with MyChart, please visit --  [email or web address].

## 2024-11-23 ENCOUNTER — Encounter (HOSPITAL_BASED_OUTPATIENT_CLINIC_OR_DEPARTMENT_OTHER): Payer: Self-pay

## 2024-11-26 ENCOUNTER — Telehealth: Payer: Self-pay

## 2024-11-26 ENCOUNTER — Telehealth: Payer: Self-pay | Admitting: Pharmacy Technician

## 2024-11-26 MED ORDER — TICAGRELOR 60 MG PO TABS: 30.0000 mg | ORAL_TABLET | Freq: Two times a day (BID) | ORAL | 3 refills | Status: AC

## 2024-11-26 NOTE — Telephone Encounter (Signed)
 [facility] sent a refill request for Brilinta : attached in media

## 2024-11-26 NOTE — Telephone Encounter (Signed)
 Refill for Brilinta  30 mg po bid sent to the pts pharmacy for 90 day supply.  Pharmacy sent to was [facility].

## 2024-11-26 NOTE — Telephone Encounter (Signed)
 I received a message from the front desk.   Patient stated that she needs a prescription refill for her Brilinta  60MG  daily, the original prescription was sent through Dr.Deveshawr   I reviewed the patient's chart and a message was sent from Pharmacy to her cardiology office for a refill since they last filled this medication.

## 2024-11-26 NOTE — Telephone Encounter (Signed)
 Okay to refill Brilinta  60mg  half tablet BID.   Shaquaya Wuellner S Branon Sabine, NP

## 2024-11-26 NOTE — Addendum Note (Signed)
 Addended by: GLADIS PORTER HERO on: 11/26/2024 12:19 PM   Modules accepted: Orders

## 2024-11-26 NOTE — Telephone Encounter (Signed)
 Shannon [name], are you ok with refilling her Brilinta  30 bid or should Neuro be refilling this?  Just wanted to ask before sending in.   Thanks, [employer]

## 2024-11-29 NOTE — Telephone Encounter (Signed)
 Patient's pharmacy has the order for her medication sent in by cards.   While on the phone she mentions intermittent neurological changes. She has had epiodes over the past few months were she has had trouble speaking, numbness and weakness sometimes bilaterally in the legs but sometimes on one side.   She denies active symptoms at this time but we did decide to have her come in to see Dr. Lester to discuss these symptoms.   She will keep me updated about any new episodes and document these episodes on paper to discuss with him in January.

## 2024-11-29 NOTE — Telephone Encounter (Signed)
 I called the patient. I discussed with her that Reche Finder NP refilled this medication for her. I told her that Dr. Lester could take over her script at next refill if she felt more comfortable with that.   She said that the pharmacy mailing service contacted her that they had not received anything. I am calling them to confirm if they got the script form the cardiology office. If not I will fax the order.   I will call the patient back after I talk with them.

## 2024-11-30 NOTE — Telephone Encounter (Signed)
 Agree with recommendation for sooner follow up given symptoms. She historically has not felt well but appears decreased from baseline. Can schedule with Dr. Lonni or myself. Dr. Lonni does have an open slot this afternoon. Otherwise okay to use a TOC slot of mine.   Zohal Reny S Essa Malachi, NP

## 2024-12-08 ENCOUNTER — Encounter (HOSPITAL_BASED_OUTPATIENT_CLINIC_OR_DEPARTMENT_OTHER): Payer: Self-pay | Admitting: Family Medicine

## 2024-12-08 DIAGNOSIS — E039 Hypothyroidism, unspecified: Secondary | ICD-10-CM

## 2024-12-10 ENCOUNTER — Encounter (HOSPITAL_BASED_OUTPATIENT_CLINIC_OR_DEPARTMENT_OTHER): Payer: Self-pay | Admitting: Family

## 2024-12-10 ENCOUNTER — Ambulatory Visit (INDEPENDENT_AMBULATORY_CARE_PROVIDER_SITE_OTHER): Admitting: Family

## 2024-12-10 VITALS — BP 160/82 | HR 76 | Ht 65.0 in | Wt 123.0 lb

## 2024-12-10 DIAGNOSIS — R42 Dizziness and giddiness: Secondary | ICD-10-CM

## 2024-12-10 DIAGNOSIS — E785 Hyperlipidemia, unspecified: Secondary | ICD-10-CM | POA: Diagnosis not present

## 2024-12-10 DIAGNOSIS — R0609 Other forms of dyspnea: Secondary | ICD-10-CM | POA: Diagnosis not present

## 2024-12-10 DIAGNOSIS — I25118 Atherosclerotic heart disease of native coronary artery with other forms of angina pectoris: Secondary | ICD-10-CM | POA: Diagnosis not present

## 2024-12-10 DIAGNOSIS — I471 Supraventricular tachycardia, unspecified: Secondary | ICD-10-CM

## 2024-12-10 DIAGNOSIS — I10 Essential (primary) hypertension: Secondary | ICD-10-CM

## 2024-12-10 LAB — BASIC METABOLIC PANEL WITH GFR
BUN/Creatinine Ratio: 28 (ref 12–28)
BUN: 18 mg/dL (ref 8–27)
CO2: 27 mmol/L (ref 20–29)
Calcium: 10.2 mg/dL (ref 8.7–10.3)
Chloride: 100 mmol/L (ref 96–106)
Creatinine, Ser: 0.64 mg/dL (ref 0.57–1.00)
Glucose: 85 mg/dL (ref 70–99)
Potassium: 4.6 mmol/L (ref 3.5–5.2)
Sodium: 139 mmol/L (ref 134–144)
eGFR: 90 mL/min/1.73

## 2024-12-10 LAB — CBC
Hematocrit: 41 % (ref 34.0–46.6)
Hemoglobin: 13.6 g/dL (ref 11.1–15.9)
MCH: 31.3 pg (ref 26.6–33.0)
MCHC: 33.2 g/dL (ref 31.5–35.7)
MCV: 94 fL (ref 79–97)
Platelets: 176 x10E3/uL (ref 150–450)
RBC: 4.35 x10E6/uL (ref 3.77–5.28)
RDW: 11.8 % (ref 11.7–15.4)
WBC: 4.8 x10E3/uL (ref 3.4–10.8)

## 2024-12-10 LAB — THYROID PANEL WITH TSH
Free Thyroxine Index: 2.3 (ref 1.2–4.9)
T3 Uptake Ratio: 31 % (ref 24–39)
T4, Total: 7.5 ug/dL (ref 4.5–12.0)
TSH: 1.7 u[IU]/mL (ref 0.450–4.500)

## 2024-12-10 MED ORDER — ATORVASTATIN CALCIUM 40 MG PO TABS: 40.0000 mg | ORAL_TABLET | Freq: Every day | ORAL | 1 refills | Status: AC

## 2024-12-10 MED ORDER — LEVOTHYROXINE SODIUM 75 MCG PO TABS: 75.0000 ug | ORAL_TABLET | ORAL | 1 refills | Status: AC

## 2024-12-10 NOTE — Progress Notes (Signed)
 " Cardiology Office Note:  .   Date:  12/10/2024  ID:  Shannon [name] 04-04-1946, MRN [id number] PCP: de [place], Shannon J, MD  [facility] Providers Cardiologist:  Shannon Bruckner, MD    History of Present Illness: .   Shannon [name] is a 78 y.o. female  with a hx of hypertension, CVA, CAD (05/31/22 DES-RCA)   Prior cardiac cath (date unknown) with reported 30% RCA lesion. Prior echo 2019 with normal LVEF, negative bubble study. Previous ETT 02/26/21 with fair exercise tolerance, 1mm up-sloping to horizontal ST depressions in inferior lateral leads of borderline significance.  Due to chest pain cardiac CTA performed 12/03/2021 with coronary calcium  score 150 placing her in the 67th percentile for age and sex matched control.  She had noncalcified plaques in the proximal RCA with moderate 50 to 69% stenosis, calcified plaque in the proximal RCA, LAD, proximal LCx with 0 to 24% stenosis.  Noted PFO.  FFR on RCA of 0.8 suggesting borderline likelihood of hemodynamic significance.  She was started on Imdur  but did not tolerate.  Previously did not tolerate increased doses of metoprolol .   At follow up 05/20/22 noted exertional dyspnea and chest pain. Cardiac cath ordered and performed with PCI/DES to prox RCA, nonobstructive disease (dist LAD, mid-dist Cx 10%). Discharged on Brilinta  90mg  BID for at least 6 months though 3 months could be considered if bleeding issues.     Myoview  08/24/22 low risk study, no evidence of ischemia nor infarction, EF 68%. Echo 03/06/24 LVEF 65-70%, gr1dd, mild LVH, no significant valvular abnormalities.   04/05/24 CTA head and neck with stable right sided stented middle cerebral artery. Left middle cerebral artery M1 segment with decreased caliber proximally as well as decreased caliber of one of the bifurcation branches of left middle cerebral artery. MRI brain ordered, CTA heead and neck recommended for 6 mos. MR 04/30/24 no acut4e intracranial finding, unchanged  chronic infarcts, several nonspecific chronic microhemorrhages stable from previous.   Due to persistent dizziness, repeat ZIO monitor while off beta blocker ordered. Monitor processing data as of 06/11/24, not yet available for review.   ED visit 06/17/24 with exertional dyspnea as well as intermittent lower chest pain and lightheadedness. Troponin negative x2. EKG nonacute. D-Dimer elevated with CTA without PE nor pneumonia.  On follow-up 06/19/2024 due to persistent exertional dyspnea and chest discomfort LHC ordered.  LHC 06/27/2024 patent RCA stent with distal LAD, mid circumflex 10% stenosed which was stable.  She was seen 07/13/24. Chief concern of memory as she notes her medications were not in her pillbox that morning but she was not certain when she took them. As SBP controlled, advised to wait until tomorrow to take her next dose of lisinopril . She reported sinus drainage, PCP follow up encouraged. Her exertional dyspnea was improved with her daily walk. Occasional intermittent lightheadedness and dizziness with no near syncope, syncope. Due to monitor with 439 brief episodes of SVT/AT, referred to EP.   Seen by Dr. Cindie 08/29/24. Her SVT was attributed to atrial tachycardia, no further workup recommended.  Her lightheadedness dizziness were suspected related to be due to orthostasis and possible variations in blood pressure.  No further workup from EP perspective recommended.  Slow position changes, adequate hydration were encouraged.  Presents today for follow up after contacting the office noting increased dyspnea, chest heaviness late October. She has upcoming visit 12/19/24 with Dr. Lester of neurosurgery for a myriad of neurology concerns. Reports she has been having shortness  of breath with minimal activity such as changing the bed and carrying laundry today. After a couple hours she sat down to rest. She reports this has been ongoing since late November or early December. Describes the dyspnea as  feeling she can't get air in her mid-chest with activity such as walking. It is not correlated routinely with chest pain. The symptoms with exertion are the same indoors or outdoors. No orthopnea, PND. No appreciable edema on exam.   She additionally reports some low blood pressure readings early December.  She also notes memory concerns and missed doses of medications. She brings BP log with readings ranging systolic from 104 to 200. When elevated they do decrease quickly and most BP readings 120-130s.  Descirbes dizzines as catching me offguard. This has been ongoing for some time with no near syncope, syncope. She takes her Lisinopril  in medication. No recent changes in morning.  She continues to have issues with sinus congestion. She is using Flonase once per day with some improvement in symptoms and is requiring less cough medication than she was. She is sleeping better as she is coughing less.   ROS: Please see the history of present illness.    All other systems reviewed and are negative.   Studies Reviewed: SABRA   EKG Interpretation Date/Time:  Monday December 10 2024 14:56:05 EST Ventricular Rate:  76 PR Interval:  170 QRS Duration:  78 QT Interval:  394 QTC Calculation: 443 R Axis:   32  Text Interpretation: Normal sinus rhythm Normal ECG Confirmed by Shannon [name] ([id number]) on 12/10/2024 3:07:21 PM     Cardiac Studies & Procedures   ______________________________________________________________________________________________ CARDIAC CATHETERIZATION  CARDIAC CATHETERIZATION 06/27/2024  Conclusion   Dist LAD lesion is 10% stenosed.   Mid Cx to Dist Cx lesion is 10% stenosed.   Previously placed Prox RCA stent of unknown type is  widely patent.  Patent RCA stent Very mild non-obstructive plaque in the LAD and Circumflex Normal LV filling pressure (LVEDP=10 mmHg)  Recommendations: Continue medical management of CAD  Findings Coronary Findings Diagnostic  Dominance:  Right  Left Anterior Descending Dist LAD lesion is 10% stenosed.  Left Circumflex Mid Cx to Dist Cx lesion is 10% stenosed.  Right Coronary Artery Previously placed Prox RCA stent of unknown type is  widely patent.  Intervention  No interventions have been documented.   CARDIAC CATHETERIZATION  CARDIAC CATHETERIZATION 05/31/2022  Conclusion   Prox RCA lesion is 75% stenosed.  A drug-eluting stent was successfully placed using a SYNERGY XD 3.0X16, postdilated to 3.25 mm and optimized with IVUS.   Post intervention, there is a 0% residual stenosis.   Dist LAD lesion is 10% stenosed.   Mid Cx to Dist Cx lesion is 10% stenosed.   The left ventricular systolic function is normal.   LV end diastolic pressure is normal.   The left ventricular ejection fraction is 55-65% by visual estimate.   There is no aortic valve stenosis.  Successful PCI of the proximal RCA.  Patient was on a very low-dose of Brilinta  in the past.  Apparently, she was a Plavix  nonresponder.  She will need to go back to Brilinta  90 mg twice a day for 6 months, ideally.  Could consider 3 months if there were bleeding issues.  After that time, her Brilinta  could be reduced.  Will CC Dr. Dolphus.  Continue aggressive secondary prevention.  Findings Coronary Findings Diagnostic  Dominance: Right  Left Anterior Descending Dist LAD lesion is 10% stenosed.  Left Circumflex Mid Cx to Dist Cx lesion is 10% stenosed.  Right Coronary Artery Prox RCA lesion is 75% stenosed. Ultrasound (IVUS) was performed. Cross-sectional area: 2.52 mm.  Severe plaque burden was detected.  Intervention  Prox RCA lesion Stent CATH LAUNCHER 6FR JR4 guide catheter was inserted. Lesion crossed with guidewire using a WIRE ASAHI PROWATER 180CM. Pre-stent angioplasty was not performed. A drug-eluting stent was successfully placed using a SYNERGY XD 3.0X16. Stent strut is well apposed. Post-stent angioplasty was performed using a BALL  SAPPHIRE NC24 3.25X12. Post-Intervention Lesion Assessment The intervention was successful. Pre-interventional TIMI flow is 3. Post-intervention TIMI flow is 3. No complications occurred at this lesion. Ultrasound (IVUS) was performed on the lesion post PCI using a CATH OPTICROSS HD. Stent well apposed. There is a 0% residual stenosis post intervention.   STRESS TESTS  MYOCARDIAL PERFUSION IMAGING 09/02/2022  Interpretation Summary   The study is normal. The study is low risk based on normal perfusion.   Severely elevated pre-test BP of 201/92 mmHg.   LV perfusion is normal. There is no evidence of ischemia. There is no evidence of infarction.   Left ventricular function is normal. Nuclear stress EF: 68 %. The left ventricular ejection fraction is hyperdynamic (>65%). End diastolic cavity size is normal. End systolic cavity size is normal.   Prior study not available for comparison.   ECHOCARDIOGRAM  ECHOCARDIOGRAM COMPLETE 03/02/2024  Narrative ECHOCARDIOGRAM REPORT    Patient Name:   Shannon [name] Even Date of Exam: 03/02/2024 Medical Rec #:  [id number]     Height:       65.0 in Accession #:    [id number]    Weight:       118.8 lb Date of Birth:  [birth year]     BSA:          1.585 m Patient Age:    78 years      BP:           182/80 mmHg Patient Gender: F             HR:           74 bpm. Exam Location:  Outpatient  Procedure: 2D Echo, 3D Echo, Cardiac Doppler, Color Doppler and Strain Analysis (Both Spectral and Color Flow Doppler were utilized during procedure).  Indications:    R07.2 Precordial pain; R06.9 DOE; R60.0 Lower extremity edema; R42 Lightheaded  History:        Patient has prior history of Echocardiogram examinations, most recent 12/09/2018. Stroke and TIA, Signs/Symptoms:Dizziness/Lightheadedness, Chest Pain, Dyspnea and Edema; Risk Factors:Hypertension and Non-Smoker. Patient complains of chest pain, DOE, leg edema and dizziness.  Sonographer:    Annabella Cater  RVT, RDCS (AE), RDMS Referring Phys: [phone] Alayiah Fontes S Zianne Schubring  IMPRESSIONS   1. Left ventricular ejection fraction, by estimation, is 65 to 70%. Left ventricular ejection fraction by 3D volume is 60 %. The left ventricle has normal function. The left ventricle has no regional wall motion abnormalities. There is mild left ventricular hypertrophy. Left ventricular diastolic parameters are consistent with Grade I diastolic dysfunction (impaired relaxation). 2. Right ventricular systolic function is normal. The right ventricular size is normal. Tricuspid regurgitation signal is inadequate for assessing PA pressure. 3. Left atrial size was mild to moderately dilated. 4. The mitral valve is grossly normal. Trivial mitral valve regurgitation. No evidence of mitral stenosis. 5. The aortic valve is tricuspid. There is mild calcification of the aortic valve. Aortic valve regurgitation is trivial. No aortic stenosis  is present. 6. The inferior vena cava is normal in size with greater than 50% respiratory variability, suggesting right atrial pressure of 3 mmHg.  FINDINGS Left Ventricle: Left ventricular ejection fraction, by estimation, is 65 to 70%. Left ventricular ejection fraction by 3D volume is 60 %. The left ventricle has normal function. The left ventricle has no regional wall motion abnormalities. Strain was performed and the global longitudinal strain is indeterminate. The left ventricular internal cavity size was normal in size. There is mild left ventricular hypertrophy. Left ventricular diastolic parameters are consistent with Grade I diastolic dysfunction (impaired relaxation).  Right Ventricle: The right ventricular size is normal. No increase in right ventricular wall thickness. Right ventricular systolic function is normal. Tricuspid regurgitation signal is inadequate for assessing PA pressure.  Left Atrium: Left atrial size was mild to moderately dilated.  Right Atrium: Right atrial size  was normal in size.  Pericardium: There is no evidence of pericardial effusion.  Mitral Valve: The mitral valve is grossly normal. Trivial mitral valve regurgitation. No evidence of mitral valve stenosis.  Tricuspid Valve: The tricuspid valve is normal in structure. Tricuspid valve regurgitation is trivial. No evidence of tricuspid stenosis.  Aortic Valve: The aortic valve is tricuspid. There is mild calcification of the aortic valve. Aortic valve regurgitation is trivial. Aortic regurgitation PHT measures 582 msec. No aortic stenosis is present. Aortic valve mean gradient measures 2.0 mmHg. Aortic valve peak gradient measures 4.8 mmHg. Aortic valve area, by VTI measures 2.40 cm.  Pulmonic Valve: The pulmonic valve was normal in structure. Pulmonic valve regurgitation is trivial. No evidence of pulmonic stenosis.  Aorta: The aortic root is normal in size and structure.  Venous: The inferior vena cava is normal in size with greater than 50% respiratory variability, suggesting right atrial pressure of 3 mmHg.  IAS/Shunts: No atrial level shunt detected by color flow Doppler.  Additional Comments: 3D was performed not requiring image post processing on an independent workstation and was normal.   LEFT VENTRICLE PLAX 2D LVIDd:         3.48 cm         Diastology LVIDs:         2.24 cm         LV e' medial:    3.60 cm/s LV PW:         0.98 cm         LV E/e' medial:  16.6 LV IVS:        1.27 cm         LV e' lateral:   5.29 cm/s LVOT diam:     2.10 cm         LV E/e' lateral: 11.3 LV SV:         58 LV SV Index:   36 LVOT Area:     3.46 cm        3D Volume EF LV 3D EF:    Left ventricul ar ejection fraction by 3D volume is 60 %.  3D Volume EF: 3D EF:        60 % LV EDV:       74 ml LV ESV:       30 ml LV SV:        44 ml  RIGHT VENTRICLE RV S prime:     12.70 cm/s TAPSE (M-mode): 1.9 cm  LEFT ATRIUM             Index  RIGHT ATRIUM           Index LA diam:         4.40 cm 2.78 cm/m   RA Area:     10.10 cm LA Vol (A2C):   66.0 ml 41.63 ml/m  RA Volume:   20.60 ml  12.99 ml/m LA Vol (A4C):   62.2 ml 39.24 ml/m LA Biplane Vol: 68.1 ml 42.96 ml/m AORTIC VALVE                    PULMONIC VALVE AV Area (Vmax):    2.69 cm     PV Vmax:          0.86 m/s AV Area (Vmean):   2.58 cm     PV Peak grad:     2.9 mmHg AV Area (VTI):     2.40 cm     PR End Diast Vel: 1.35 msec AV Vmax:           109.00 cm/s AV Vmean:          70.700 cm/s AV VTI:            0.241 m AV Peak Grad:      4.8 mmHg AV Mean Grad:      2.0 mmHg LVOT Vmax:         84.60 cm/s LVOT Vmean:        52.700 cm/s LVOT VTI:          0.167 m LVOT/AV VTI ratio: 0.69 AI PHT:            582 msec AR Vena Contracta: 0.23 cm  AORTA Ao Root diam: 3.10 cm Ao Asc diam:  3.60 cm Ao Arch diam: 3.3 cm  MITRAL VALVE MV Area (PHT): 2.71 cm    SHUNTS MV Decel Time: 280 msec    Systemic VTI:  0.17 m MV E velocity: 59.90 cm/s  Systemic Diam: 2.10 cm MV A velocity: 97.70 cm/s MV E/A ratio:  0.61  Soyla Merck MD Electronically signed by Soyla Merck MD Signature Date/Time: 03/03/2024/1:16:40 PM    Final    MONITORS  LONG TERM MONITOR (3-14 DAYS) 06/20/2024  Narrative Patch Wear Time:  13 days and 23 hours (2025-06-12T13:13:15-0400 to 2025-06-26T13:13:06-399)  Patient had a min HR of 37 bpm, max HR of 146 bpm, and avg HR of 74 bpm. Predominant underlying rhythm was Sinus Rhythm. 439 Supraventricular Tachycardia runs occurred, the run with the fastest interval lasting 5 beats with a max rate of 146 bpm, the longest lasting 9 beats with an avg rate of 109 bpm.  Junctional Rhythm was detected within +/- 45 seconds of symptomatic patient event(s). No VT, atrial fibrillation, high degree block, or pauses noted. Isolated atrial and ventricular ectopy was rare (<1%). There were 12 triggered events, 6 of which were sinus rhythm with or without PACs, and 6 were sinus rhythm with intermittent  brief junctional rhythm.   CT SCANS  CT CORONARY FRACTIONAL FLOW RESERVE DATA PREP 12/03/2021  Narrative EXAM: FFRCT ANALYSIS  FINDINGS: FFRct analysis was performed on the original cardiac CT angiogram dataset. Diagrammatic representation of the FFRct analysis is provided in a separate PDF document in PACS. This dictation was created using the PDF document and an interactive 3D model of the results. 3D model is not available in the EMR/PACS. Normal FFR range is >0.80.  1. Left Main: No significant lesions  2. LAD: No significant lesions  3. LCX:No significant lesions  4.  RCA: CT FFR 0.8 across lesion in proximal RCA  IMPRESSION: 1. CT FFR 0.8 across lesion in proximal RCA, which suggests borderline likelihood of hemodynamic significance   Electronically Signed By: Lonni Nanas M.D. On: 12/03/2021 23:47   CT SCANS  CT CORONARY MORPH W/CTA COR W/SCORE 12/03/2021  Addendum 12/03/2021  4:43 PM ADDENDUM REPORT: 12/03/2021 16:40  CLINICAL DATA:  11F with chest pain  EXAM: Cardiac/Coronary CTA  TECHNIQUE: The patient was scanned on a [employer].  FINDINGS: A 100 kV prospective scan was triggered in the descending thoracic aorta at 111 HU's. Axial non-contrast 3 mm slices were carried out through the heart. The data set was analyzed on a dedicated work station and scored using the Agatson method. Gantry rotation speed was 250 msecs and collimation was .6 mm. 0.8 mg of sl NTG was given. The 3D data set was reconstructed in 5% intervals of the 35-75% of the R-R cycle. Phases were analyzed on a dedicated work station using MPR, MIP and VRT modes. The patient received 80 cc of contrast.  Coronary Arteries:  Normal coronary origin.  Right dominance.  RCA is a large dominant artery that gives rise to PDA and PLA. There is noncalcified plaque in the proximal RCA causing 50-69% stenosis. Calcified plaque in the proximal RCA causes 0-24%  stenosis  Left main is a large artery that gives rise to LAD and LCX arteries.  LAD is a large vessel. There is calcified plaque in the proximal LAD causing 0-24% stenosis  LCX is a non-dominant artery that gives rise to one large OM1 branch. There is calcified plaque in the proximal LCX causing 0-24% stenosis  Other findings:  Left Ventricle: Normal size  Left Atrium: Mild enlargement. PFO  Pulmonary Veins: Normal configuration  Right Ventricle: Normal size  Right Atrium: Normal size  Cardiac valves: No calcifications  Thoracic aorta: Normal size  Pulmonary Arteries: Normal size  Systemic Veins: Normal drainage  Pericardium: Normal thickness  IMPRESSION: 1. Coronary calcium  score of 150. This was 67th percentile for age and sex matched control.  2.  Normal coronary origin with right dominance.  3. Noncalcified plaque in the proximal RCA causes moderate (50-69%) stenosis  4. Calcified plaque in the proximal RCA, proximal LAD, and proximal LCX causes minimal (0-24%) stenosis  5.  PFO  6.  Will send for CT FFR  CAD-RADS 3. Moderate stenosis. Consider symptom-guided anti-ischemic pharmacotherapy as well as risk factor modification per guideline directed care. Additional analysis with CT FFR will be submitted.   Electronically Signed By: Lonni Nanas M.D. On: 12/03/2021 16:40  Narrative EXAM: OVER-READ INTERPRETATION  CT CHEST  The following report is an over-read performed by radiologist Dr. Franky Crease of [facility] on 12/03/2021. This over-read does not include interpretation of cardiac or coronary anatomy or pathology. The coronary CTA interpretation by the cardiologist is attached.  COMPARISON:  None.  FINDINGS: Vascular: Heart is normal size. Aorta normal caliber. Scattered calcifications in the descending thoracic aorta.  Mediastinum/Nodes: Calcified mediastinal and right hilar lymph nodes. No  adenopathy  Lungs/Pleura: Calcified granuloma in the right lower lobe. No confluent airspace opacities or effusions.  Upper Abdomen: Imaging into the upper abdomen demonstrates no acute findings. Calcification in the liver compatible with old granuloma.  Musculoskeletal: Chest wall soft tissues are unremarkable. No acute bony abnormality.  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  Aortic atherosclerosis.  No acute extra cardiac abnormality.  Electronically Signed: By: Franky Crease M.D. On: 12/03/2021  12:53     ______________________________________________________________________________________________        Risk Assessment/Calculations:            Physical Exam:   VS:  BP (!) 160/82   Pulse 76   Ht 5' 5 (1.651 m)   Wt 123 lb (55.8 kg)   SpO2 98%   BMI 20.47 kg/m    Wt Readings from Last 3 Encounters:  12/10/24 123 lb (55.8 kg)  10/16/24 119 lb 12.8 oz (54.3 kg)  08/29/24 118 lb 8 oz (53.8 kg)    Vitals:   12/10/24 1455 12/10/24 1533  BP: (!) 172/100 (!) 160/82  Pulse: 76   Height: 5' 5 (1.651 m)   Weight: 123 lb (55.8 kg)   SpO2: 98%   BMI (Calculated): 20.47    GEN: Well nourished, well developed in no acute distress, anxious NECK: No JVD; No carotid bruits CARDIAC: RRR, no murmurs, rubs, gallops RESPIRATORY:  Clear to auscultation without rales, wheezing or rhonchi  ABDOMEN: Soft, non-tender, non-distended EXTREMITIES:  No edema; No deformity   ASSESSMENT AND PLAN: .     DOE - ongoing for ~6 weeks with activity indoors or outdoors. No orthopnea, PND. LHC 06/27/24 with patent RCA stent and otherwise minimal 10% stenosis. Echo 02/2024 normal LVEF 65-70%, gr1dd, mild LVH, no significant valvular disease. EKG today NSR with no acute ST/T wave changes. As symptoms similar to anginal equivalent, plan for lexiscan  myoview . Will additionally refer to pulmonology for further workup of dyspnea on exertion.   Coronary artery disease / HLD, LDL goal <55  - PCI/DES RCA 05/31/22. 08/2022 low risk myoview . 06/2024 LHC patent RCA stent. Continue aspirin  81 mg, Atorvastatin  40mg  daily.  BB previously stopped due to bradycardia. 06/12/24 LDL 55.lexiscan  myoview , as above.    Palpitations / Tachycardia / Bradycardia /  Dizziness- Beta blocker (metoprolol ) previously discontinued due to bradycardia, sinus pause. Echo 03/02/24 normal LVEF 65-70%, gr1dd, no significant valvular abnormalities. Monitor 05/2024 after discontinuation of Metoprolol  to evaluate for recurrent dizziness in processing, with junctional rhythm noted w/in 45 seconds as well as asymptomatic episodes of SVT. Evaluated with EP 08/2024, given short episodes of SVT not felt to attribute to her symptoms and no further workup recommended.    PFO -incidental finding on CT.  Negative bubble study on prior echo.  History of stroke but was due to ischemia/stenosis. No indication for closure.    HTN / [place] coat hypertension- BP elevated consistent with [place] coat hypertension initial reading in clinic 172/100 which improved to 160/82 without intervention. Anticipate anxiety contributes to her labile blood pressure. SBP at home labile 104-200 when checked multiple times per day. Most often SBP at home 120-130s. Continue Lisinopril  5mg  daily. Discussed if BP at home consistently >130 would increase Lisinopril  dose.   Hypothyroidism - Managed by primary care.   History of CVA - On Aspirin  81 mg and Brilinta  30mg  BID per previous interventional neurology. Follows with neurosurgery.   Middle cerebral artery stent - Follows with neurosurgery. Upcoming visit with Dr. Lester 12/19/24.  Informed Consent   Shared Decision Making/Informed Consent The risks [chest pain, shortness of breath, cardiac arrhythmias, dizziness, blood pressure fluctuations, myocardial infarction, stroke/transient ischemic attack, nausea, vomiting, allergic reaction, radiation exposure, metallic taste sensation and life-threatening complications  (estimated to be 1 in 10,000)], benefits (risk stratification, diagnosing coronary artery disease, treatment guidance) and alternatives of a nuclear stress test were discussed in detail with Ms. Vanderzanden and she agrees to proceed.  Dispo: follow up in February as scheduled with Dr. Lonni  Signed, Reche GORMAN Finder, NP   "

## 2024-12-10 NOTE — Patient Instructions (Addendum)
 Medication Instructions:  Continue your current medications  *If you need a refill on your cardiac medications before your next appointment, please call your pharmacy*  Lab Work: Your physician recommends that you return for lab work today: CBC, BMET, thyroid  panel  If you have labs (blood work) drawn today and your tests are completely normal, you will receive your results only by: MyChart Message (if you have MyChart) OR A paper copy in the mail If you have any lab test that is abnormal or we need to change your treatment, we will call you to review the results.  Testing/Procedures: Your provider has recommended a stress test.   You are scheduled for a Myocardial Perfusion Imaging Study on              at                .   Please arrive 15 minutes prior to your appointment time for registration and insurance purposes.   The test will take approximately 3 to 4 hours to complete; you may bring reading material. If someone comes with you to your appointment, they will need to remain in the main lobby due to limited space in the testing area.    How to prepare for your Myocardial Perfusion test:   Do not eat or drink 3 hours prior to your test, except you may have water.    Do not consume products containing caffeine (regular or decaffeinated) 12 hours prior to your test (ex: coffee, chocolate, soda, tea)   Do bring a list of your current medications with you. If not listed below, you may take your medications as normal.   Bring any held medication to your appointment, as you may be required to take it once the test is complete.   Do wear comfortable clothes (no dresses or overalls) and walking shoes. Tennis shoes are preferred. No heels or open toed shoes.  Do not wear  perfume, aftershave or lotions (deodorant is allowed).   If these instructions are not followed, you test will have to be rescheduled.   Please report to [address] for your test. If you have  questions or concerns about your appointment, please call the Nuclear Lab at #[phone].  If you cannot keep your appointment, please provide 24 hour notification to the Nuclear lab to avoid a possible $50 charge to your account.      Follow-Up: At [facility], you and your health needs are our priority.  As part of our continuing mission to provide you with exceptional heart care, our providers are all part of one team.  This team includes your primary Cardiologist (physician) and Advanced Practice Providers or APPs (Physician Assistants and Nurse Practitioners) who all work together to provide you with the care you need, when you need it.  Your next appointment:   As scheduled with Dr. Lonni in February  We recommend signing up for the patient portal called MyChart.  Sign up information is provided on this After Visit Summary.  MyChart is used to connect with patients for Virtual Visits (Telemedicine).  Patients are able to view lab/test results, encounter notes, upcoming appointments, etc.  Non-urgent messages can be sent to your provider as well.   To learn more about what you can do with MyChart, go to [email or web address].   Other Instructions  To prevent lightheadedness: Stay well hydrated Eat regular meals Make position changes slowly

## 2024-12-11 ENCOUNTER — Ambulatory Visit (HOSPITAL_BASED_OUTPATIENT_CLINIC_OR_DEPARTMENT_OTHER): Payer: Self-pay | Admitting: Family

## 2024-12-11 DIAGNOSIS — R0609 Other forms of dyspnea: Secondary | ICD-10-CM

## 2024-12-12 ENCOUNTER — Other Ambulatory Visit (HOSPITAL_BASED_OUTPATIENT_CLINIC_OR_DEPARTMENT_OTHER): Payer: Self-pay | Admitting: Family

## 2024-12-12 DIAGNOSIS — R0609 Other forms of dyspnea: Secondary | ICD-10-CM

## 2024-12-12 DIAGNOSIS — I25118 Atherosclerotic heart disease of native coronary artery with other forms of angina pectoris: Secondary | ICD-10-CM

## 2024-12-14 ENCOUNTER — Encounter (HOSPITAL_BASED_OUTPATIENT_CLINIC_OR_DEPARTMENT_OTHER): Payer: Self-pay | Admitting: Family Medicine

## 2024-12-14 ENCOUNTER — Telehealth (HOSPITAL_COMMUNITY): Payer: Self-pay | Admitting: *Deleted

## 2024-12-14 NOTE — Telephone Encounter (Signed)
 Patient given detailed instructions per Myocardial Perfusion Study Information Sheet for the test on 12/17/2024 at 10:45. Patient notified to arrive 15 minutes early and that it is imperative to arrive on time for appointment to keep from having the test rescheduled.  If you need to cancel or reschedule your appointment, please call the office within 24 hours of your appointment. . Patient verbalized understanding.Shannon [name]

## 2024-12-17 ENCOUNTER — Ambulatory Visit (HOSPITAL_COMMUNITY)
Admission: RE | Admit: 2024-12-17 | Discharge: 2024-12-17 | Disposition: A | Discharge status: Home / Self Care | Source: Ambulatory Visit

## 2024-12-17 DIAGNOSIS — I25118 Atherosclerotic heart disease of native coronary artery with other forms of angina pectoris: Secondary | ICD-10-CM | POA: Insufficient documentation

## 2024-12-17 DIAGNOSIS — R0609 Other forms of dyspnea: Secondary | ICD-10-CM | POA: Diagnosis not present

## 2024-12-17 LAB — MYOCARDIAL PERFUSION IMAGING
LV dias vol: 57 mL (ref 46–106)
LV sys vol: 9 mL
Nuc Stress EF: 84 %
Peak HR: 84 {beats}/min
Rest HR: 69 {beats}/min
Rest Nuclear Isotope Dose: 10.9 mCi
SDS: 0
SRS: 0
SSS: 0
ST Depression (mm): 0 mm
Stress Nuclear Isotope Dose: 30.1 mCi
TID: 1.22

## 2024-12-17 MED ORDER — REGADENOSON 0.4 MG/5ML IV SOLN
INTRAVENOUS | Status: AC
  Filled 2024-12-17: qty 5

## 2024-12-17 MED ORDER — TECHNETIUM TC 99M TETROFOSMIN IV KIT
30.1000 | PACK | Freq: Once | INTRAVENOUS | Status: AC | PRN
  Administered 2024-12-17: 30.1 via INTRAVENOUS

## 2024-12-17 MED ORDER — TECHNETIUM TC 99M TETROFOSMIN IV KIT
10.9000 | PACK | Freq: Once | INTRAVENOUS | Status: AC | PRN
  Administered 2024-12-17: 10.9 via INTRAVENOUS

## 2024-12-17 MED ORDER — REGADENOSON 0.4 MG/5ML IV SOLN
0.4000 mg | Freq: Once | INTRAVENOUS | Status: AC
  Administered 2024-12-17: 0.4 mg via INTRAVENOUS

## 2024-12-19 ENCOUNTER — Ambulatory Visit: Admitting: Neuroradiology

## 2024-12-19 ENCOUNTER — Encounter: Payer: Self-pay | Admitting: Neuroradiology

## 2024-12-19 VITALS — BP 172/64 | HR 82 | Ht 65.0 in | Wt 121.4 lb

## 2024-12-19 DIAGNOSIS — Z95828 Presence of other vascular implants and grafts: Secondary | ICD-10-CM | POA: Diagnosis not present

## 2024-12-19 DIAGNOSIS — I672 Cerebral atherosclerosis: Secondary | ICD-10-CM

## 2024-12-19 DIAGNOSIS — Z8673 Personal history of transient ischemic attack (TIA), and cerebral infarction without residual deficits: Secondary | ICD-10-CM | POA: Diagnosis not present

## 2024-12-19 DIAGNOSIS — G459 Transient cerebral ischemic attack, unspecified: Secondary | ICD-10-CM

## 2024-12-19 NOTE — Progress Notes (Signed)
 "    Chief Complaint: Patient was seen in consultation today for  Chief Complaint  Patient presents with   Office Visit    Possible TIA episodes. Message sent to [facility] to see if she needs any imaging before LM    at the request of de Cuba,Raymond J  Referring Physician(s): de Cuba,Raymond J  Shannon [name] is a 79 y.o. female seen for follow-up of intracranial atherosclerosis  Assessment and Plan Assessment & Plan Intracranial atherosclerosis with prior right middle cerebral artery stenting December 25, 2018, left middle cerebral artery atherosclerotic stenosis. 2. Possible left hemispheric TIA in August 2025 due to left middle cerebral artery stenosis. 3. 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 4. Left vertebral origin stent placed 11/19/2020. No basilar stenosis.  -Dizziness likely due to vestibular aging.  Further intracranial stenting riskier than medical therapy and not beneficial. Continued Brilinta  increases bleeding risk without benefit. - Discontinue Brilinta . - Continue aspirin  81 mg daily. -Continue medical treatment of vascular risk factors. - Monitor for significant stroke symptoms and call 911 if she occurs.  Routine follow-up imaging not necessary unless she develops new stroke symptoms      Discussed the use of AI scribe software for clinical note transcription with the patient, who gave verbal consent to proceed.  History of Present Illness Shannon [name] is a 79 year old female with intracranial atherosclerosis and right middle cerebral artery stents who presents with episodes of speech difficulty, facial twitching, and dizziness.  In August, she experienced an episode where her tongue felt thick, leading to trouble speaking, described as 'my words wouldn't come through.' No facial weakness or drooping was noted during this episode.  In October, she fell face down, and by November, she began experiencing twitching and numbness on both sides of her face,  particularly around her mouth, with the left side being more affected.  On November 27, she had difficulty thinking clearly while preparing lunch, which she describes as 'clouded thinking.' Her daughter had to take over. She notes generalized weakness rather than weakness in one specific arm or side. She also reports experiencing multiple spells of dizziness.  She underwent a stress test recently due to generalized weakness and chest heaviness.  She has been on Brilinta  for five years, initially prescribed post-stent placement, and is also on an 81 mg aspirin .  Her past medical history includes chronic infarcts in the right basal ganglia as seen on a brain MRI from May 2025. A CT scan from April 2025 showed patent stents and intracranial atherosclerosis of the left middle cerebral artery.  She has no history of smoking or heart attacks.    Past Medical History:  Diagnosis Date   Anxiety    Artery stenosis    cerebral   Arthritis    Cancer (HCC)    skin - squamous cell   Complication of anesthesia     I had difficulty coming out of it   Dyspnea    with exertion   Family history of adverse reaction to anesthesia     My daughter has a problem coming ou to it   GERD (gastroesophageal reflux disease)    Headache    migraines in the past   Hypertension    Hypothyroidism    Stroke [facility]) 11/2018   some weakness on right side    SVT (supraventricular tachycardia)    Thyroid  disease     Past Surgical History:  Procedure Laterality Date   ABDOMINAL HYSTERECTOMY  APPENDECTOMY     CARDIAC CATHETERIZATION     CARPAL TUNNEL RELEASE Bilateral    COLONOSCOPY     CORONARY STENT INTERVENTION N/A 05/31/2022   Procedure: CORONARY STENT INTERVENTION;  Surgeon: Dann Candyce RAMAN, MD;  Location: [facility] INVASIVE CV LAB;  Service: Cardiovascular;  Laterality: N/A;   CORONARY ULTRASOUND/IVUS N/A 05/31/2022   Procedure: Intravascular Ultrasound/IVUS;  Surgeon: Dann Candyce RAMAN, MD;   Location: [facility] INVASIVE CV LAB;  Service: Cardiovascular;  Laterality: N/A;   DILATION AND CURETTAGE OF UTERUS     IR ANGIO INTRA EXTRACRAN SEL COM CAROTID INNOMINATE BILAT MOD SED  12/25/2018   IR ANGIO INTRA EXTRACRAN SEL COM CAROTID INNOMINATE BILAT MOD SED  10/30/2019   IR ANGIO INTRA EXTRACRAN SEL COM CAROTID INNOMINATE BILAT MOD SED  09/23/2020   IR ANGIO VERTEBRAL SEL SUBCLAVIAN INNOMINATE UNI L MOD SED  12/25/2018   IR ANGIO VERTEBRAL SEL SUBCLAVIAN INNOMINATE UNI L MOD SED  10/30/2019   IR ANGIO VERTEBRAL SEL SUBCLAVIAN INNOMINATE UNI L MOD SED  09/23/2020   IR ANGIO VERTEBRAL SEL SUBCLAVIAN INNOMINATE UNI R MOD SED  01/04/2019   IR ANGIO VERTEBRAL SEL VERTEBRAL UNI R MOD SED  12/25/2018   IR ANGIO VERTEBRAL SEL VERTEBRAL UNI R MOD SED  10/30/2019   IR ANGIO VERTEBRAL SEL VERTEBRAL UNI R MOD SED  09/23/2020   IR CT HEAD LTD  01/04/2019   IR CT HEAD LTD  11/21/2019   IR INTRA CRAN STENT  01/04/2019   IR INTRA CRAN STENT  11/21/2019   IR RADIOLOGIST EVAL & MGMT  03/12/2021   IR RADIOLOGIST EVAL & MGMT  09/10/2021   IR RADIOLOGIST EVAL & MGMT  01/07/2023   IR RADIOLOGIST EVAL & MGMT  07/05/2023   IR RADIOLOGIST EVAL & MGMT  04/06/2024   IR TRANSCATH EXCRAN VERT OR CAR A STENT  11/17/2020   IR US  GUIDE VASC ACCESS RIGHT  12/25/2018   IR US  GUIDE VASC ACCESS RIGHT  11/02/2019   LEFT HEART CATH AND CORONARY ANGIOGRAPHY N/A 05/31/2022   Procedure: LEFT HEART CATH AND CORONARY ANGIOGRAPHY;  Surgeon: Dann Candyce RAMAN, MD;  Location: [facility] INVASIVE CV LAB;  Service: Cardiovascular;  Laterality: N/A;   LEFT HEART CATH AND CORONARY ANGIOGRAPHY N/A 06/27/2024   Procedure: LEFT HEART CATH AND CORONARY ANGIOGRAPHY;  Surgeon: Verlin Lonni BIRCH, MD;  Location: [facility] INVASIVE CV LAB;  Service: Cardiovascular;  Laterality: N/A;   OVARIAN CYST REMOVAL     RADIOLOGY WITH ANESTHESIA N/A 01/04/2019   Procedure: RMCA angioplasty with possible stenting;  Surgeon: Dolphus Carrion, MD;  Location: [facility] OR;  Service:  Radiology;  Laterality: N/A;   RADIOLOGY WITH ANESTHESIA N/A 11/21/2019   Procedure: IR WITH ANESTHESIA/ STENT PLACEMENT;  Surgeon: Dolphus Carrion, MD;  Location: [facility] OR;  Service: Radiology;  Laterality: N/A;   RADIOLOGY WITH ANESTHESIA N/A 10/01/2020   Procedure: IR WITH ANESTHESIA ANGIOPLASTY WITH POSSIBLE STENTING;  Surgeon: Dolphus Carrion, MD;  Location: [facility] OR;  Service: Radiology;  Laterality: N/A;   RADIOLOGY WITH ANESTHESIA N/A 11/17/2020   Procedure: IR WITH ANESTHESIA STENT PLACEMENT;  Surgeon: Dolphus Carrion, MD;  Location: [facility] OR;  Service: Radiology;  Laterality: N/A;    Allergies: Diltiazem , Elastic bandages & [zinc], Isosorbide  mononitrate [isosorbide  nitrate], Metoprolol , Sulfa antibiotics, Tape, Mandol [cefamandole], and Sinutab non-drowsy max [pseudoephedrine-acetaminophen ]  Medications: Prior to Admission medications  Medication Sig Start Date End Date Taking? Authorizing Provider  acetaminophen  (TYLENOL ) 325 MG tablet Take 325 mg by mouth every 6 (six) hours as  needed for headache.    Yes [provider]  Artificial Tear Solution (SOOTHE XP) SOLN Place 1 drop into both eyes as needed (dry eyes).   Yes [provider]  aspirin  EC 81 MG EC tablet Take 1 tablet (81 mg total) by mouth daily. 12/10/18  Yes Mayo, Rockie Overly, MD  atorvastatin  (LIPITOR) 40 MG tablet Take 1 tablet (40 mg total) by mouth daily at 6 PM. 12/10/24  Yes de Cuba, Quintin PARAS, MD  benzonatate  (TESSALON  PERLES) 100 MG capsule Take 1 capsule (100 mg total) by mouth 3 (three) times daily as needed for cough. 07/13/24  Yes Vannie Reche RAMAN, NP  Calcium  Carb-Cholecalciferol (CALCIUM  600 + D PO) Take 300-600 mg by mouth See admin instructions. Take 600 mg after breakfast, 600 mg after lunch, and 300 mg after dinner   Yes [provider]  Coenzyme Q10 50 MG CAPS Take 50 mg by mouth daily.    Yes [provider]  escitalopram  (LEXAPRO ) 5 MG tablet Take 1 tablet (5 mg total)  by mouth daily. 10/16/24  Yes de Cuba, Raymond J, MD  fluticasone [facility]) 50 MCG/ACT nasal spray Place 2 sprays into both nostrils daily.   Yes [provider]  levothyroxine  (SYNTHROID ) 75 MCG tablet Take 1 tablet (75 mcg total) by mouth every Tuesday, Thursday, Saturday, and Sunday. 12/11/24  Yes de [place], Raymond J, MD  levothyroxine  (SYNTHROID , LEVOTHROID) 50 MCG tablet Take 50 mcg by mouth every Monday, Wednesday, and Friday.  04/24/12  Yes [provider]  lisinopril  (ZESTRIL ) 5 MG tablet Take 1 tablet (5 mg total) by mouth daily. 04/12/24  Yes Swinyer, Rosaline HERO, NP  loratadine  (CLARITIN ) 10 MG tablet Take 10 mg by mouth daily.   Yes [provider]  Multiple Vitamin (MULTIVITAMIN) capsule Take 1 capsule by mouth daily. Senior 04/24/12  Yes [provider]  nitroGLYCERIN  (NITROSTAT ) 0.4 MG SL tablet Place 0.4 mg under the tongue every 5 (five) minutes as needed for chest pain.   Yes [provider]  Polyvinyl Alcohol -Povidone PF (REFRESH) 1.4-0.6 % SOLN Apply 1 drop to eye as needed (dry eye). Both eyes   Yes [provider]  ticagrelor  (BRILINTA ) 60 MG TABS tablet Take 0.5 tablets (30 mg total) by mouth 2 (two) times daily. 11/26/24  Yes Vannie Reche RAMAN, NP  vitamin E  400 UNIT capsule Take 400 Units by mouth every other day.    Yes [provider]     Family History  Problem Relation Age of Onset   CVA Mother    Heart disease Mother    Peripheral Artery Disease Father    Breast cancer Sister    Breast cancer Sister    CVA Maternal Aunt     Social History   Socioeconomic History   Marital status: Married    Spouse name: Tennis   Number of children: Not on file   Years of education: Not on file   Highest education level: Some college, no degree  Occupational History   Occupation: retired  Tobacco Use   Smoking status: Never    Passive exposure: Never   Smokeless tobacco: Never  Vaping Use   Vaping status: Never  Used  Substance and Sexual Activity   Alcohol  use: Never   Drug use: Never   Sexual activity: Yes  Other Topics Concern   Not on file  Social History Narrative   Not on file   Social Drivers of Health   Tobacco Use: Low Risk (  12/19/2024)   Patient History    Smoking Tobacco Use: Never    Smokeless Tobacco Use: Never    Passive Exposure: Never  Financial Resource Strain: Low Risk (10/13/2024)   Overall Financial Resource Strain (CARDIA)    Difficulty of Paying Living Expenses: Not hard at all  Food Insecurity: No Food Insecurity (10/13/2024)   Epic    Worried About Programme Researcher, Broadcasting/film/video in the Last Year: Never true    Ran Out of Food in the Last Year: Never true  Transportation Needs: No Transportation Needs (10/13/2024)   Epic    Lack of Transportation (Medical): No    Lack of Transportation (Non-Medical): No  Physical Activity: Sufficiently Active (10/13/2024)   Exercise Vital Sign    Days of Exercise per Week: 6 days    Minutes of Exercise per Session: 60 min  Stress: Stress Concern Present (10/13/2024)   [employer] of Occupational Health - Occupational Stress Questionnaire    Feeling of Stress: To some extent  Social Connections: Socially Integrated (10/13/2024)   Social Connection and Isolation Panel    Frequency of Communication with Friends and Family: More than three times a week    Frequency of Social Gatherings with Friends and Family: Not on file    Attends Religious Services: More than 4 times per year    Active Member of Clubs or Organizations: Yes    Attends Banker Meetings: More than 4 times per year    Marital Status: Married  Depression (PHQ2-9): Low Risk (10/16/2024)   Depression (PHQ2-9)    PHQ-2 Score: 0  Alcohol  Screen: Low Risk (03/14/2024)   Alcohol  Screen    Last Alcohol  Screening Score (AUDIT): 0  Housing: Unknown (10/13/2024)   Epic    Unable to Pay for Housing in the Last Year: No    Number of Times Moved in the Last Year: Not on  file    Homeless in the Last Year: No  Utilities: Not At Risk (03/14/2024)   AHC Utilities    Threatened with loss of utilities: No  Health Literacy: Adequate Health Literacy (03/14/2024)   B1300 Health Literacy    Frequency of need for help with medical instructions: Never    Review of Systems  Respiratory:  Positive for chest tightness.   Cardiovascular:        Chest heaviness-being evaluated by cardiology    Vital Signs:  BP (!) 172/64   Pulse 82   Ht 5' 5 (1.651 m)   Wt 121 lb 6.4 oz (55.1 kg)   SpO2 99%   BMI 20.20 kg/m   Physical Exam CHEST: Clear to auscultation bilaterally, no wheezes, rhonchi, or crackles. CARDIOVASCULAR: Normal heart rate and rhythm, S1 and S2 normal without murmurs. ABDOMEN: Soft, no aneurysm palpable, non-tender, non-distended, without organomegaly. NEUROLOGICAL: Alert and oriented with normal speech expression, fluency and comprehension. Visual fields are full to confrontation.   Face is symmetric. Strength in the arms and legs is symmetric with no drift.  Sensation is normal and symmetric. No ataxia. No inattention.   Imaging:  Results Radiology Brain MRI (04/19/2024): Chronic infarcts in the right basal ganglia. (Independently interpreted) Head and neck CT arteriogram (03/23/2024): Patent stent in the left vertebral origin; intracranial stents in the distal right internal carotid artery extending into the right middle cerebral artery; intracranial atherosclerosis of the left middle cerebral artery.  No basilar stenosis (Independently interpreted)  Labs:  CBC: Recent Labs    06/12/24 0849 06/17/24 1111 12/10/24 1604  WBC 3.3* 4.0 4.8  HGB 13.7 14.4 13.6  HCT 41.3 41.4 41.0  PLT 165 188 176    COAGS: No results for input(s): INR, APTT in the last 8760 hours.  BMP: Recent Labs    04/12/24 1327 06/12/24 0849 06/17/24 1111 12/10/24 1604  NA 140 138 135 139  K 4.3 4.5 4.0 4.6  CL 101 100 101 100  CO2 26 24 23 27    GLUCOSE 134* 88 101* 85  BUN 12 13 16 18   CALCIUM  9.8 9.6 9.7 10.2  CREATININE 0.80 0.67 0.71 0.64  GFRNONAA  --   --  >60  --     LIVER FUNCTION TESTS: Recent Labs    06/12/24 0849  BILITOT 0.5  AST 22  ALT 25  ALKPHOS 93  PROT 6.8  ALBUMIN  4.4      Electronically Signed: Nancyann LULLA Burns  12/19/2024, 3:49 PM   "

## 2024-12-26 ENCOUNTER — Ambulatory Visit (HOSPITAL_BASED_OUTPATIENT_CLINIC_OR_DEPARTMENT_OTHER): Payer: Self-pay | Admitting: Family

## 2025-01-02 ENCOUNTER — Encounter (HOSPITAL_BASED_OUTPATIENT_CLINIC_OR_DEPARTMENT_OTHER): Payer: Self-pay | Admitting: Pulmonary Disease

## 2025-01-02 ENCOUNTER — Ambulatory Visit (HOSPITAL_BASED_OUTPATIENT_CLINIC_OR_DEPARTMENT_OTHER)

## 2025-01-02 ENCOUNTER — Ambulatory Visit (INDEPENDENT_AMBULATORY_CARE_PROVIDER_SITE_OTHER): Admitting: Pulmonary Disease

## 2025-01-02 VITALS — BP 130/84 | HR 64 | Ht 65.0 in | Wt 122.4 lb

## 2025-01-02 DIAGNOSIS — R0602 Shortness of breath: Secondary | ICD-10-CM | POA: Diagnosis not present

## 2025-01-02 DIAGNOSIS — R0982 Postnasal drip: Secondary | ICD-10-CM

## 2025-01-02 DIAGNOSIS — J309 Allergic rhinitis, unspecified: Secondary | ICD-10-CM | POA: Diagnosis not present

## 2025-01-02 DIAGNOSIS — R053 Chronic cough: Secondary | ICD-10-CM | POA: Diagnosis not present

## 2025-01-02 DIAGNOSIS — R0609 Other forms of dyspnea: Secondary | ICD-10-CM

## 2025-01-02 LAB — PULMONARY FUNCTION TEST
DL/VA % pred: 131 %
DL/VA: 5.33 ml/min/mmHg/L
DLCO cor % pred: 89 %
DLCO cor: 17.49 ml/min/mmHg
DLCO unc % pred: 89 %
DLCO unc: 17.6 ml/min/mmHg
FEF 25-75 Post: 3.59 L/s
FEF 25-75 Pre: 2.91 L/s
FEF2575-%Change-Post: 23 %
FEF2575-%Pred-Post: 228 %
FEF2575-%Pred-Pre: 185 %
FEV1-%Change-Post: 4 %
FEV1-%Pred-Post: 105 %
FEV1-%Pred-Pre: 101 %
FEV1-Post: 2.22 L
FEV1-Pre: 2.13 L
FEV1FVC-%Change-Post: 3 %
FEV1FVC-%Pred-Pre: 116 %
FEV6-%Change-Post: 0 %
FEV6-%Pred-Post: 93 %
FEV6-%Pred-Pre: 92 %
FEV6-Post: 2.49 L
FEV6-Pre: 2.47 L
FEV6FVC-%Pred-Post: 105 %
FEV6FVC-%Pred-Pre: 105 %
FVC-%Change-Post: 0 %
FVC-%Pred-Post: 88 %
FVC-%Pred-Pre: 87 %
FVC-Post: 2.49 L
FVC-Pre: 2.47 L
Post FEV1/FVC ratio: 89 %
Post FEV6/FVC ratio: 100 %
Pre FEV1/FVC ratio: 86 %
Pre FEV6/FVC Ratio: 100 %
RV % pred: 110 %
RV: 2.64 L
TLC % pred: 89 %
TLC: 4.64 L

## 2025-01-02 NOTE — Patient Instructions (Signed)
 Return to office at 10:45 AM to be checked in for 11 AM pulmonary function test

## 2025-01-02 NOTE — Progress Notes (Signed)
 "   Subjective:   PATIENT ID: Shannon [name] GENDER: female DOB: 1946/03/30, MRN: [id number]  Chief Complaint  Patient presents with   Establish Care    Shortness of breath, cough x 9 months at night    Reason for Visit: New consult for shortness of breath with exertion    Shannon [name] is a 79 y.o. female never smoker with CAD s/p pRCA stent 2022, hx TIA s/p R MCA stent 2020 for intracranial atherosclerosis, HTN, atrial tachycardia who presents for evaluation for shortness of breath with exertion and cough.  Has been seen by Cardiology for extensive work-up and management including ETT 02/26/21 with fair tolerance. CTA with findings notable noncalcified plaques in pRCA 50-69%. S/p proximal RCA stent in 2023 for exertional dyspnea and chest pain. Persistent symptoms with myoview  08/24/22 with no evidence of ischemia and no infarction.  She was seen in the ED 06/17/24 for exertional dyspnea, chest pain and dizziness/lightheadedness. CTA neg for PE, overall normal parenchyma. Seen in follow-up with Cardiology and [facility] 06/27/24 demonstrated patent RCA stent and mild nonobstructive CAD. Has had long term monitor with brief episodes of SVT/AT and referred to EP, who attributed SVT due to atrial tachycardia with no further work-up recommended.  Of note has been seen by Neurology for memory issues and missed medications. MRI 04/19/24 with no acute intracranial finding.  Social History: Never smoker  Environmental exposures:      01/02/2025 Discussed the use of AI scribe software for clinical note transcription with the patient, who gave verbal consent to proceed.  History of Present Illness Shannon [name] is a 79 year old female with coronary artery disease who presents with dyspnea on exertion.  She has experienced dyspnea on exertion since her stent placement in 2023, with worsening symptoms noted in November. The dyspnea is triggered by activities such as housework and occasionally occurs at rest  during activities like Bible study. She describes a sensation of weight on her chest and shortness of breath during these episodes. She reports that a stress test was performed and that she was told everything was fine.  She reports central weight gain of about five pounds over the last few months and decreased activity due to reduced energy since the fall of 2025. She has a history of blood pressure issues and is under cardiology care. She experiences chest pressure during exercise or housework, describing it as feeling like 'I have eaten and it didn't go any further'. She sometimes feels like she can't catch her full breath when it gets really bad.  She has had a persistent cough since April of the previous year, which worsens at night. She uses Delsym and Tessalon  Perles for the cough, although she avoids taking Tessalon  Perles at night due to concerns about side effects. She experiences postnasal drainage and nasal congestion, which worsens when she lies down, causing her ears to feel 'stocked up'. She uses Flonase in the morning and a saline spray at night, and takes Claritin  for allergies. No wheezing and has never used inhalers.  She walks her dog daily, aiming for about a mile, but finds it exhausting. She used to walk three miles with her previous dog. She averages 3,000 to 3,500 steps daily, reaching 5,000 on more active days. She has a treadmill at home but finds it frustrating to use due to her slow pace.  Her diet includes oatmeal for breakfast, a meat and vegetables for lunch, and peanut butter on unsalted crackers with an  orange for dinner. She has a small appetite and is concerned about weight gain.     Past Medical History:  Diagnosis Date   Anxiety    Artery stenosis    cerebral   Arthritis    Cancer (HCC)    skin - squamous cell   Complication of anesthesia     I had difficulty coming out of it   Dyspnea    with exertion   Family history of adverse reaction to anesthesia      My daughter has a problem coming ou to it   GERD (gastroesophageal reflux disease)    Headache    migraines in the past   Hypertension    Hypothyroidism    Stroke (HCC) 11/2018   some weakness on right side    SVT (supraventricular tachycardia)    Thyroid  disease      Family History  Problem Relation Age of Onset   CVA Mother    Heart disease Mother    Peripheral Artery Disease Father    Breast cancer Sister    Breast cancer Sister    CVA Maternal Aunt      Social History   Occupational History   Occupation: retired  Tobacco Use   Smoking status: Never    Passive exposure: Never   Smokeless tobacco: Never  Vaping Use   Vaping status: Never Used  Substance and Sexual Activity   Alcohol  use: Never   Drug use: Never   Sexual activity: Yes    Allergies[1]   Outpatient Medications Prior to Visit  Medication Sig Dispense Refill   acetaminophen  (TYLENOL ) 325 MG tablet Take 325 mg by mouth every 6 (six) hours as needed for headache.      Artificial Tear Solution (SOOTHE XP) SOLN Place 1 drop into both eyes as needed (dry eyes).     aspirin  EC 81 MG EC tablet Take 1 tablet (81 mg total) by mouth daily. 30 tablet 0   atorvastatin  (LIPITOR) 40 MG tablet Take 1 tablet (40 mg total) by mouth daily at 6 PM. 90 tablet 1   Calcium  Carb-Cholecalciferol (CALCIUM  600 + D PO) Take 300-600 mg by mouth See admin instructions. Take 600 mg after breakfast, 600 mg after lunch, and 300 mg after dinner     Coenzyme Q10 50 MG CAPS Take 50 mg by mouth daily.      escitalopram  (LEXAPRO ) 5 MG tablet Take 1 tablet (5 mg total) by mouth daily. 90 tablet 1   fluticasone (FLONASE) 50 MCG/ACT nasal spray Place 2 sprays into both nostrils daily.     levothyroxine  (SYNTHROID ) 75 MCG tablet Take 1 tablet (75 mcg total) by mouth every Tuesday, Thursday, Saturday, and Sunday. 48 tablet 1   levothyroxine  (SYNTHROID , LEVOTHROID) 50 MCG tablet Take 50 mcg by mouth every Monday, Wednesday, and Friday.       lisinopril  (ZESTRIL ) 5 MG tablet Take 1 tablet (5 mg total) by mouth daily. 90 tablet 3   loratadine  (CLARITIN ) 10 MG tablet Take 10 mg by mouth daily.     Multiple Vitamin (MULTIVITAMIN) capsule Take 1 capsule by mouth daily. Senior     nitroGLYCERIN  (NITROSTAT ) 0.4 MG SL tablet Place 0.4 mg under the tongue every 5 (five) minutes as needed for chest pain.     Polyvinyl Alcohol -Povidone PF (REFRESH) 1.4-0.6 % SOLN Apply 1 drop to eye as needed (dry eye). Both eyes     vitamin E  400 UNIT capsule Take 400 Units by mouth every other day.  benzonatate  (TESSALON  PERLES) 100 MG capsule Take 1 capsule (100 mg total) by mouth 3 (three) times daily as needed for cough. (Patient not taking: Reported on 01/02/2025) 20 capsule 0   ticagrelor  (BRILINTA ) 60 MG TABS tablet Take 0.5 tablets (30 mg total) by mouth 2 (two) times daily. (Patient not taking: Reported on 01/02/2025) 90 tablet 3   No facility-administered medications prior to visit.    Review of Systems  Constitutional:  Negative for chills, diaphoresis, fever, malaise/fatigue and weight loss.  HENT:  Negative for congestion.   Respiratory:  Positive for cough and shortness of breath. Negative for hemoptysis, sputum production and wheezing.   Cardiovascular:  Negative for chest pain, palpitations and leg swelling.     Objective:   Vitals:   01/02/25 0823  BP: 130/84  Pulse: 64  SpO2: 100%  Weight: 122 lb 6.4 oz (55.5 kg)  Height: 5' 5 (1.651 m)   SpO2: 100 %  Physical Exam GENERAL: Well appearing, no acute distress. HEAD EARS NOSE THROAT: Normocephalic, atraumatic. EYES: Extraocular movements intact, no scleral icterus. RESPIRATORY: Clear to auscultation bilaterally, no crackles, wheezing or rales. CARDIOVASCULAR: Regular rate and rhythm, no murmurs, rubs, or gallops, no jugular venous distention. EXTREMITIES: No edema, no tenderness. NEUROLOGICAL: Alert and oriented x4, cranial nerves II-XII grossly intact. PSYCHIATRIC:  Normal mood, normal affect.   Data Reviewed:  Imaging: CTA 06/17/24 - neg for PE. No significant parenchymal abnormalities. No pulmonary nodules, masses, infiltrate, effusion or pneumothorax.  PFT: 01/02/25 FVC 2.49 (88%) FEV1 2.22 (105%) Ratio 86  TLC 89% DLCO 89% Interpretation: Normal pulmonary function tests  Labs: CBC    Component Value Date/Time   WBC 4.8 12/10/2024 1604   WBC 4.0 06/17/2024 1111   RBC 4.35 12/10/2024 1604   RBC 4.51 06/17/2024 1111   HGB 13.6 12/10/2024 1604   HCT 41.0 12/10/2024 1604   PLT 176 12/10/2024 1604   MCV 94 12/10/2024 1604   MCH 31.3 12/10/2024 1604   MCH 31.9 06/17/2024 1111   MCHC 33.2 12/10/2024 1604   MCHC 34.8 06/17/2024 1111   RDW 11.8 12/10/2024 1604   LYMPHSABS 1.3 06/12/2024 0849   MONOABS 0.3 06/02/2022 1130   EOSABS 0.1 06/12/2024 0849   BASOSABS 0.0 06/12/2024 0849  Abs eos 06/12/24 - 100  Cardiac: LHC 05/31/22 - pRCA 75% stenosed s/p PCI Echo 03/02/24 - normal EF, grade I DD LHC 06/27/24 - Patent RCA stent, non obstructive LAD and circumflex Stress test 12/17/24 - Negative     Assessment & Plan:   Discussion: 79 y.o. female never smoker with CAD s/p pRCA stent 2022, hx TIA s/p R MCA stent 2020 for intracranial atherosclerosis, HTN, atrial tachycardia who presents for evaluation for shortness of breath with exertion and cough. Shortness of breath is her primary concern. CT imaging reviewed with normal parenchyma. Coordinated in clinic same day PFTs. We discussed how if pulmonary work-up is negative this is likely related to deconditioning since her stroke. Would recommend regular exercise and rehab if she is interested in the future. Counseled on dietary changes for building muscle mass. Addressed questions and concerns.    Assessment and Plan Assessment & Plan Chronic dyspnea on exertion Worsened post-stent placement, exacerbated by activity. Cardiac evaluation normal. Consider asthma due to allergies, though age atypical. -  Scheduled pulmonary function tests to assess lung function and rule out asthma. - Encouraged increasing daily steps to 5,000-7,000. - Advised using treadmill at home, starting with 10 minutes and increasing by 2 minutes weekly. -  Discussed dietary modifications to include more protein.  Chronic cough with postnasal drainage Chronic nocturnal cough likely due to postnasal drainage. No wheezing. Nasal congestion noted, especially when lying down. - Scheduled pulmonary function tests to assess lung function and rule out asthma. - Continue using Flonase nasal spray and saline spray as needed. - Continue Claritin  for allergy management.  Allergic rhinitis Nasal congestion and postnasal drainage, exacerbated when lying down. Managed with Flonase and Claritin . - Continue Flonase nasal spray and Claritin  for allergy management.  Central weight gain 5-pound gain over months, possibly due to decreased activity and dietary habits. Concerns about health and mobility impact. - Encouraged dietary modifications to include more protein and healthy vegetables. - Advised increasing physical activity for weight management and muscle building.  Addendum: 01/07/25 Phone encounter: Reviewed test results. Normal PFTs. Offered physical therapy. Patient declined due to caregiver duties to her husband. Counseled on activity at home.  Health Maintenance Immunization History  Administered Date(s) Administered   Fluad Quad(high Dose 65+) 09/01/2019, 11/08/2023   INFLUENZA, HIGH DOSE SEASONAL PF 10/12/2018, 11/08/2023, 08/20/2024   Influenza-Unspecified 09/13/2022   PFIZER Comirnaty (Gray Top)Covid-19 Tri-Sucrose Vaccine 04/10/2021   PFIZER(Purple Top)SARS-COV-2 Vaccination 01/03/2020, 01/24/2020, 09/08/2020   Pfizer Covid-19 Vaccine Bivalent Booster 85yrs & up 09/02/2021   Pfizer(Comirnaty )Fall Seasonal Vaccine 12 years and older 09/13/2022, 08/29/2024   Respiratory Syncytial Virus Vaccine,Recomb Aduvanted(Arexvy)  12/30/2022   Tdap 11/10/2017   Unspecified SARS-COV-2 Vaccination 11/28/2023   CT Lung Screen - never smoker  Orders Placed This Encounter  Procedures   Pulmonary function test    Standing Status:   Future    Expiration Date:   01/02/2026    Where should this test be performed?:   Outpatient Pulmonary    What type of PFT is being ordered?:   Full PFT  No orders of the defined types were placed in this encounter.   No follow-ups on file. As needed  I have spent a total time of 45-minutes on the day of the appointment reviewing prior documentation, coordinating care and discussing medical diagnosis and plan with the patient/family. Imaging, labs and tests included in this note have been reviewed and interpreted independently by me. This note is generated using [employer] programming. Patient/family has given consent.  Giulietta Prokop Slater Staff, MD [facility] 01/02/2025 9:08 AM        [1]  Allergies Allergen Reactions   Diltiazem      shortness of breath, heaviness in chest, and fatigue    Elastic Bandages & [Zinc] Itching   Isosorbide  Mononitrate [Isosorbide  Nitrate]     Either palpitations or heaviness in chest, can not remember    Metoprolol      Low heart rate    Sulfa Antibiotics Other (See Comments)    Tongue turned black   Tape     Pulled blood through the skin- happened during a procedure in Oct 2021    Mandol [Cefamandole] Rash   Sinutab Non-Drowsy Max [Pseudoephedrine-Acetaminophen ] Palpitations   "

## 2025-01-02 NOTE — Progress Notes (Signed)
 Full PFT performed today.

## 2025-01-02 NOTE — Patient Instructions (Signed)
 Full PFT performed today.

## 2025-01-07 ENCOUNTER — Ambulatory Visit (HOSPITAL_BASED_OUTPATIENT_CLINIC_OR_DEPARTMENT_OTHER): Payer: Self-pay | Admitting: Pulmonary Disease

## 2025-01-11 ENCOUNTER — Other Ambulatory Visit: Payer: Self-pay | Admitting: Family Medicine

## 2025-01-11 DIAGNOSIS — Z1231 Encounter for screening mammogram for malignant neoplasm of breast: Secondary | ICD-10-CM

## 2025-01-21 ENCOUNTER — Ambulatory Visit (HOSPITAL_BASED_OUTPATIENT_CLINIC_OR_DEPARTMENT_OTHER): Admitting: Cardiology

## 2025-02-08 ENCOUNTER — Ambulatory Visit (HOSPITAL_BASED_OUTPATIENT_CLINIC_OR_DEPARTMENT_OTHER): Admitting: Family

## 2025-02-13 ENCOUNTER — Ambulatory Visit (HOSPITAL_BASED_OUTPATIENT_CLINIC_OR_DEPARTMENT_OTHER): Admitting: Family Medicine

## 2025-02-21 ENCOUNTER — Ambulatory Visit

## 2025-04-03 ENCOUNTER — Other Ambulatory Visit (HOSPITAL_BASED_OUTPATIENT_CLINIC_OR_DEPARTMENT_OTHER)

## 2025-07-11 ENCOUNTER — Ambulatory Visit: Admitting: Neuroradiology
